# Patient Record
Sex: Male | Born: 1935
Health system: Southern US, Community
[De-identification: ages and names within clinical notes are randomized; demographics above are authoritative.]

## PROBLEM LIST (undated history)

## (undated) DIAGNOSIS — I2510 Atherosclerotic heart disease of native coronary artery without angina pectoris: Secondary | ICD-10-CM

## (undated) DIAGNOSIS — J449 Chronic obstructive pulmonary disease, unspecified: Secondary | ICD-10-CM

## (undated) DIAGNOSIS — K4090 Unilateral inguinal hernia, without obstruction or gangrene, not specified as recurrent: Secondary | ICD-10-CM

## (undated) DIAGNOSIS — J189 Pneumonia, unspecified organism: Secondary | ICD-10-CM

## (undated) DIAGNOSIS — K219 Gastro-esophageal reflux disease without esophagitis: Secondary | ICD-10-CM

## (undated) DIAGNOSIS — K635 Polyp of colon: Secondary | ICD-10-CM

## (undated) DIAGNOSIS — F1910 Other psychoactive substance abuse, uncomplicated: Secondary | ICD-10-CM

## (undated) DIAGNOSIS — I6529 Occlusion and stenosis of unspecified carotid artery: Secondary | ICD-10-CM

## (undated) DIAGNOSIS — I10 Essential (primary) hypertension: Secondary | ICD-10-CM

## (undated) DIAGNOSIS — N189 Chronic kidney disease, unspecified: Secondary | ICD-10-CM

## (undated) DIAGNOSIS — IMO0001 Reserved for inherently not codable concepts without codable children: Secondary | ICD-10-CM

## (undated) DIAGNOSIS — E669 Obesity, unspecified: Secondary | ICD-10-CM

## (undated) DIAGNOSIS — E785 Hyperlipidemia, unspecified: Secondary | ICD-10-CM

## (undated) DIAGNOSIS — D369 Benign neoplasm, unspecified site: Secondary | ICD-10-CM

## (undated) DIAGNOSIS — I219 Acute myocardial infarction, unspecified: Secondary | ICD-10-CM

## (undated) DIAGNOSIS — N529 Male erectile dysfunction, unspecified: Secondary | ICD-10-CM

## (undated) DIAGNOSIS — Z87442 Personal history of urinary calculi: Secondary | ICD-10-CM

## (undated) DIAGNOSIS — R002 Palpitations: Secondary | ICD-10-CM

## (undated) DIAGNOSIS — D469 Myelodysplastic syndrome, unspecified: Secondary | ICD-10-CM

## (undated) DIAGNOSIS — H269 Unspecified cataract: Secondary | ICD-10-CM

## (undated) DIAGNOSIS — R55 Syncope and collapse: Secondary | ICD-10-CM

## (undated) HISTORY — DX: Reserved for inherently not codable concepts without codable children: IMO0001

## (undated) HISTORY — DX: Unspecified cataract: H26.9

## (undated) HISTORY — DX: Gastro-esophageal reflux disease without esophagitis: K21.9

## (undated) HISTORY — DX: Occlusion and stenosis of unspecified carotid artery: I65.29

## (undated) HISTORY — DX: Syncope and collapse: R55

## (undated) HISTORY — DX: Essential (primary) hypertension: I10

## (undated) HISTORY — DX: Palpitations: R00.2

## (undated) HISTORY — DX: Other psychoactive substance abuse, uncomplicated: F19.10

## (undated) HISTORY — DX: Polyp of colon: K63.5

## (undated) HISTORY — DX: Atherosclerotic heart disease of native coronary artery without angina pectoris: I25.10

## (undated) HISTORY — DX: Acute myocardial infarction, unspecified: I21.9

## (undated) HISTORY — DX: Benign neoplasm, unspecified site: D36.9

## (undated) HISTORY — PX: CATARACT EXTRACTION W/ INTRAOCULAR LENS  IMPLANT, BILATERAL: SHX1307

## (undated) HISTORY — DX: Male erectile dysfunction, unspecified: N52.9

## (undated) HISTORY — PX: APPENDECTOMY: SHX54

## (undated) HISTORY — PX: TONSILLECTOMY: SUR1361

## (undated) HISTORY — DX: Obesity, unspecified: E66.9

## (undated) HISTORY — DX: Hyperlipidemia, unspecified: E78.5

## (undated) NOTE — *Deleted (*Deleted)
Responded to Code Stroke called at 2146 for L sided weakness and expressive aphasia, LSN-2100. Pt arrived at 2200, CBG-172, NIH-2 for L facial droop and aphasia, CT head-negative. TPA not given-"too good to treat." CTA-no LVO, CTP-. Pt taken to MRI at 2232. Once in MRI, pt developed respiratory distress with pursed lip breathing and SpO2-79% on RA. Pt placed on 6L [place] with SpO2 increasing to 89%. Pt then placed on NRB with SpO2 increasing to 99%, however, distress continued and pt unable to lay flat for MRI. Pt transported back to ED room. Plan to admit to medical team.

---

## 1998-02-06 HISTORY — PX: DOPPLER ECHOCARDIOGRAPHY: SHX263

## 1998-12-06 HISTORY — PX: CORONARY STENT PLACEMENT: SHX1402

## 1999-01-20 ENCOUNTER — Ambulatory Visit (HOSPITAL_COMMUNITY): Admission: RE | Admit: 1999-01-20 | Discharge: 1999-01-21 | Payer: Self-pay | Admitting: Cardiology

## 1999-01-20 HISTORY — PX: CARDIAC CATHETERIZATION: SHX172

## 2006-12-06 DIAGNOSIS — IMO0001 Reserved for inherently not codable concepts without codable children: Secondary | ICD-10-CM

## 2006-12-06 HISTORY — DX: Reserved for inherently not codable concepts without codable children: IMO0001

## 2007-04-19 HISTORY — PX: CARDIOVASCULAR STRESS TEST: SHX262

## 2008-03-14 ENCOUNTER — Ambulatory Visit: Payer: Self-pay | Admitting: Pulmonary Disease

## 2008-03-14 DIAGNOSIS — E7800 Pure hypercholesterolemia, unspecified: Secondary | ICD-10-CM | POA: Insufficient documentation

## 2008-03-14 DIAGNOSIS — J309 Allergic rhinitis, unspecified: Secondary | ICD-10-CM | POA: Insufficient documentation

## 2008-03-14 DIAGNOSIS — Z8679 Personal history of other diseases of the circulatory system: Secondary | ICD-10-CM | POA: Insufficient documentation

## 2008-03-14 DIAGNOSIS — I10 Essential (primary) hypertension: Secondary | ICD-10-CM

## 2008-03-14 DIAGNOSIS — I219 Acute myocardial infarction, unspecified: Secondary | ICD-10-CM | POA: Insufficient documentation

## 2008-03-14 DIAGNOSIS — J449 Chronic obstructive pulmonary disease, unspecified: Secondary | ICD-10-CM | POA: Insufficient documentation

## 2008-04-08 ENCOUNTER — Telehealth (INDEPENDENT_AMBULATORY_CARE_PROVIDER_SITE_OTHER): Payer: Self-pay | Admitting: *Deleted

## 2008-04-25 ENCOUNTER — Ambulatory Visit: Payer: Self-pay | Admitting: Pulmonary Disease

## 2009-04-02 ENCOUNTER — Ambulatory Visit: Payer: Self-pay | Admitting: Vascular Surgery

## 2009-07-14 ENCOUNTER — Encounter: Admission: RE | Admit: 2009-07-14 | Discharge: 2009-07-14 | Payer: Self-pay | Admitting: Surgery

## 2010-11-04 ENCOUNTER — Ambulatory Visit: Payer: Self-pay | Admitting: Cardiology

## 2011-02-10 ENCOUNTER — Other Ambulatory Visit (INDEPENDENT_AMBULATORY_CARE_PROVIDER_SITE_OTHER): Payer: Medicare Other

## 2011-02-10 DIAGNOSIS — E7800 Pure hypercholesterolemia, unspecified: Secondary | ICD-10-CM

## 2011-02-23 ENCOUNTER — Other Ambulatory Visit: Payer: Self-pay | Admitting: *Deleted

## 2011-02-23 DIAGNOSIS — I10 Essential (primary) hypertension: Secondary | ICD-10-CM

## 2011-02-23 MED ORDER — AMLODIPINE BESYLATE 5 MG PO TABS: 5.0000 mg | ORAL_TABLET | Freq: Every day | ORAL | Status: DC

## 2011-02-23 NOTE — Telephone Encounter (Signed)
Refilled medication per fax request. 

## 2011-04-20 NOTE — Procedures (Signed)
CAROTID DUPLEX EXAM   INDICATION:  Right carotid bruit.   HISTORY:  Diabetes:  No.  Cardiac:  Stent.  Hypertension:  Yes.  Smoking:  Quit about 40 years ago.  Previous Surgery:  CV History:  Amaurosis Fugax No, Paresthesias No, Hemiparesis No.                                       RIGHT             LEFT  Brachial systolic pressure:         140               150  Brachial Doppler waveforms:         Biphasic          Biphasic  Vertebral direction of flow:        Antegrade         Antegrade  DUPLEX VELOCITIES (cm/sec)  CCA peak systolic                   89                107  ECA peak systolic                   105               137  ICA peak systolic                   105               149  ICA end diastolic                   23                28  PLAQUE MORPHOLOGY:                  Calcified         Calcified  PLAQUE AMOUNT:                      Mild              Moderate  PLAQUE LOCATION:                    ICA, ECA          ICA, ECA   IMPRESSION:  1. 40-59% stenosis noted in the left internal carotid artery.  2. 20-39% stenosis noted in the right internal carotid artery.  3. Antegrade bilateral vertebral arteries.   ___________________________________________  Larina Earthly, M.D.   MG/MEDQ  D:  04/02/2009  T:  04/02/2009  Job:  [id number]

## 2011-04-22 ENCOUNTER — Other Ambulatory Visit: Payer: Self-pay | Admitting: *Deleted

## 2011-04-22 MED ORDER — ATENOLOL 25 MG PO TABS: 25.0000 mg | ORAL_TABLET | Freq: Every day | ORAL | Status: DC

## 2011-04-22 NOTE — Telephone Encounter (Signed)
escribe medication per fax request  

## 2011-06-30 ENCOUNTER — Other Ambulatory Visit: Payer: Self-pay | Admitting: *Deleted

## 2011-06-30 MED ORDER — LOSARTAN POTASSIUM-HCTZ 100-25 MG PO TABS: 1.0000 | ORAL_TABLET | Freq: Every day | ORAL | Status: DC

## 2011-06-30 NOTE — Telephone Encounter (Signed)
escribe medication per fax request  

## 2011-07-09 ENCOUNTER — Encounter: Payer: Self-pay | Admitting: Cardiology

## 2011-07-14 ENCOUNTER — Ambulatory Visit: Payer: Medicare Other | Admitting: Cardiology

## 2011-08-05 ENCOUNTER — Other Ambulatory Visit: Payer: Self-pay | Admitting: Cardiology

## 2011-08-05 NOTE — Telephone Encounter (Signed)
escribe medication per fax request  

## 2011-08-06 ENCOUNTER — Other Ambulatory Visit: Payer: Self-pay | Admitting: Cardiology

## 2011-08-06 NOTE — Telephone Encounter (Signed)
escribe medication per fax request  

## 2011-08-11 ENCOUNTER — Inpatient Hospital Stay (HOSPITAL_COMMUNITY)
Admission: EM | Admit: 2011-08-11 | Discharge: 2011-08-13 | DRG: 641 | Disposition: A | Discharge status: Home / Self Care | Payer: Medicare Other | Attending: Family Medicine | Admitting: Family Medicine

## 2011-08-11 ENCOUNTER — Encounter: Payer: Self-pay | Admitting: Family Medicine

## 2011-08-11 ENCOUNTER — Emergency Department (HOSPITAL_COMMUNITY): Payer: Medicare Other

## 2011-08-11 DIAGNOSIS — N179 Acute kidney failure, unspecified: Secondary | ICD-10-CM | POA: Diagnosis present

## 2011-08-11 DIAGNOSIS — E785 Hyperlipidemia, unspecified: Secondary | ICD-10-CM | POA: Diagnosis present

## 2011-08-11 DIAGNOSIS — R339 Retention of urine, unspecified: Secondary | ICD-10-CM | POA: Diagnosis not present

## 2011-08-11 DIAGNOSIS — Z7982 Long term (current) use of aspirin: Secondary | ICD-10-CM

## 2011-08-11 DIAGNOSIS — E669 Obesity, unspecified: Secondary | ICD-10-CM | POA: Diagnosis present

## 2011-08-11 DIAGNOSIS — N400 Enlarged prostate without lower urinary tract symptoms: Secondary | ICD-10-CM | POA: Diagnosis present

## 2011-08-11 DIAGNOSIS — Z79899 Other long term (current) drug therapy: Secondary | ICD-10-CM

## 2011-08-11 DIAGNOSIS — Z87891 Personal history of nicotine dependence: Secondary | ICD-10-CM

## 2011-08-11 DIAGNOSIS — E871 Hypo-osmolality and hyponatremia: Principal | ICD-10-CM | POA: Diagnosis present

## 2011-08-11 DIAGNOSIS — R509 Fever, unspecified: Secondary | ICD-10-CM | POA: Diagnosis present

## 2011-08-11 DIAGNOSIS — I10 Essential (primary) hypertension: Secondary | ICD-10-CM | POA: Diagnosis present

## 2011-08-11 DIAGNOSIS — I2510 Atherosclerotic heart disease of native coronary artery without angina pectoris: Secondary | ICD-10-CM | POA: Diagnosis present

## 2011-08-11 DIAGNOSIS — T502X5A Adverse effect of carbonic-anhydrase inhibitors, benzothiadiazides and other diuretics, initial encounter: Secondary | ICD-10-CM | POA: Diagnosis present

## 2011-08-11 DIAGNOSIS — Z23 Encounter for immunization: Secondary | ICD-10-CM

## 2011-08-11 LAB — COMPREHENSIVE METABOLIC PANEL
AST: 28 U/L (ref 0–37)
Albumin: 4.1 g/dL (ref 3.5–5.2)
Alkaline Phosphatase: 56 U/L (ref 39–117)
CO2: 21 mEq/L (ref 19–32)
Chloride: 87 mEq/L — ABNORMAL LOW (ref 96–112)
GFR calc non Af Amer: 40 mL/min — ABNORMAL LOW (ref >60)
Potassium: 4.5 mEq/L (ref 3.5–5.1)
Total Bilirubin: 0.4 mg/dL (ref 0.3–1.2)

## 2011-08-11 LAB — BASIC METABOLIC PANEL
BUN: 24 mg/dL — ABNORMAL HIGH (ref 6–23)
CO2: 23 mEq/L (ref 19–32)
Chloride: 88 mEq/L — ABNORMAL LOW (ref 96–112)
GFR calc non Af Amer: 41 mL/min — ABNORMAL LOW (ref >60)
Glucose, Bld: 114 mg/dL — ABNORMAL HIGH (ref 70–99)
Potassium: 4.4 mEq/L (ref 3.5–5.1)
Sodium: 121 mEq/L — ABNORMAL LOW (ref 135–145)

## 2011-08-11 LAB — DIFFERENTIAL
Basophils Absolute: 0.1 10*3/uL (ref 0.0–0.1)
Eosinophils Relative: 1 % (ref 0–5)
Lymphocytes Relative: 14 % (ref 12–46)
Lymphs Abs: 0.9 10*3/uL (ref 0.7–4.0)
Monocytes Absolute: 0.8 10*3/uL (ref 0.1–1.0)
Neutro Abs: 4.2 10*3/uL (ref 1.7–7.7)

## 2011-08-11 LAB — URINALYSIS, ROUTINE W REFLEX MICROSCOPIC
Bilirubin Urine: NEGATIVE
Glucose, UA: NEGATIVE mg/dL
Hgb urine dipstick: NEGATIVE
Specific Gravity, Urine: 1.025 (ref 1.005–1.030)
pH: 6 (ref 5.0–8.0)

## 2011-08-11 LAB — CBC
HCT: 37.5 % — ABNORMAL LOW (ref 39.0–52.0)
Hemoglobin: 14.1 g/dL (ref 13.0–17.0)
MCHC: 37.6 g/dL — ABNORMAL HIGH (ref 30.0–36.0)
MCV: 88.2 fL (ref 78.0–100.0)
RDW: 13.1 % (ref 11.5–15.5)
WBC: 5.9 10*3/uL (ref 4.0–10.5)

## 2011-08-11 LAB — PROTIME-INR: INR: 1.11 (ref 0.00–1.49)

## 2011-08-11 LAB — APTT: aPTT: 31 seconds (ref 24–37)

## 2011-08-11 NOTE — H&P (Signed)
Patrick Rodriguez is an 77 y.o. male.   Chief Complaint: weakness, fever HPI: Pt is an 78 yo male presenting with a one week history of weakness with new onset fevers today. Pt states that 3 weeks ago he stepped on a toothpick. At that time he was seen by his doctor who started Bactrim. His foot pain did not improve so he was seen by his podiatrist who did an foot MRI (which showed no foreign body) and started him on Cipro as well. A few days ago, pt states the toothpick came out the dorsal side of his foot. He has not had any pain since that time, but he states he has not felt well since he started taking antibiotics one week ago. He has had dizziness, decreased PO intake and weakness. Starting this morning, pt had chills and had a measured temp of 101. A doctor acquaintance told him that given the foreign body and new temp, he should be evaluated for sepsis.  In the ED today, he was afebrile and physical exam was wnl. On his labs, pt was found to have a sodium of 121, Cr of 1.6 and BUN of 24 which is above his baseline per [facility]. (They have a BUN/Cr from Cardiology in his chart from March of 15/1.09)  Of note, patient does have complaint of lower abdominal pain. He is currently on Cardura for BPH, and he does endorse BPH like symptoms. Denies N/V, but states he has been having loose stools a few times/day. No HA, specific joint pain, or other complaints.   Past Medical History  Diagnosis Date  . Hyperlipidemia   . Hypertension   . Coronary artery disease   . Obesity   . ED (erectile dysfunction)     Past Surgical History  Procedure Date  . Cardiac catheterization 01/20/1999    EF 60%  . Tonsillectomy   . Appendectomy   . Doppler echocardiography 02/06/1998    EF 60%  . Cardiovascular stress test 04/19/2007    EF 74%  . Coronary stent placement 2000    LAD    Family History  Problem Relation Age of Onset  . Aneurysm/SAH Mother 26  . Cancer (stomach) Father    Social  History: Pt reports that he quit smoking about 36 years ago. He was a heavy drinker until 33. 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   Allergies: No Known Allergies  No current facility-administered medications on file as of 08/11/2011.   Medications Prior to Admission  Medication Sig Dispense Refill  . amLODipine (NORVASC) 5 MG tablet Take 1 tablet (5 mg total) by mouth daily.  30 tablet  11  . atenolol (TENORMIN) 25 MG tablet Take 1 tablet (25 mg total) by mouth daily.  30 tablet  5  . Coenzyme Q10 (COQ10 PO) Take by mouth daily.        Marland Kitchen doxazosin (CARDURA) 1 MG tablet TAKE 1 TABLET BY MOUTH AT BEDTIME  30 tablet  5  . esomeprazole (NEXIUM) 40 MG capsule Take 40 mg by mouth daily before breakfast.        . losartan-hydrochlorothiazide (HYZAAR) 100-25 MG per tablet Take 1 tablet by mouth daily.  30 tablet  5  . Multiple Vitamin (MULTIVITAMIN) tablet Take 1 tablet by mouth daily.        Marland Kitchen omeprazole (PRILOSEC) 20 MG capsule TAKE ONE CAPSULE BY MOUTH TWICE A DAY  30 capsule  5  . Zinc-Magnesium Aspart-Vit B6 (ZINC MAGNESIUM  ASPARTATE PO) Take by mouth daily.          Results for orders placed during the hospital encounter of 08/11/11 (from the past 48 hour(s))  URINALYSIS, ROUTINE W REFLEX MICROSCOPIC     Status: Normal   Collection Time   08/11/11  2:47 PM      Component Value Range Comment   Color, Urine YELLOW  YELLOW     Appearance CLEAR  CLEAR     Specific Gravity, Urine 1.025  1.005 - 1.030     pH 6.0  5.0 - 8.0     Glucose, UA NEGATIVE  NEGATIVE (mg/dL)    Hgb urine dipstick NEGATIVE  NEGATIVE     Bilirubin Urine NEGATIVE  NEGATIVE     Ketones, ur NEGATIVE  NEGATIVE (mg/dL)    Protein, ur NEGATIVE  NEGATIVE (mg/dL)    Urobilinogen, UA 0.2  0.0 - 1.0 (mg/dL)    Nitrite NEGATIVE  NEGATIVE     Leukocytes, UA NEGATIVE  NEGATIVE  MICROSCOPIC NOT DONE ON URINES WITH NEGATIVE PROTEIN, BLOOD, LEUKOCYTES, NITRITE, OR GLUCOSE <1000 mg/dL.  CREATININE, URINE, RANDOM     Status: Normal    Collection Time   08/11/11  2:47 PM      Component Value Range Comment   Creatinine, Urine 171.22     SODIUM, URINE, RANDOM     Status: Normal   Collection Time   08/11/11  2:47 PM      Component Value Range Comment   Sodium, Ur 95     BASIC METABOLIC PANEL     Status: Abnormal   Collection Time   08/11/11  3:12 PM      Component Value Range Comment   Sodium 121 (*) 135 - 145 (mEq/L)    Potassium 4.4  3.5 - 5.1 (mEq/L)    Chloride 88 (*) 96 - 112 (mEq/L)    CO2 23  19 - 32 (mEq/L)    Glucose, Bld 114 (*) 70 - 99 (mg/dL)    BUN 24 (*) 6 - 23 (mg/dL)    Creatinine, Ser 1.61 (*) 0.50 - 1.35 (mg/dL)    Calcium 9.0  8.4 - 10.5 (mg/dL)    GFR calc non Af Amer 41 (*) >60 (mL/min)    GFR calc Af Amer 49 (*) >60 (mL/min)   DIFFERENTIAL     Status: Abnormal   Collection Time   08/11/11  3:12 PM      Component Value Range Comment   Neutrophils Relative 71  43 - 77 (%)    Neutro Abs 4.2  1.7 - 7.7 (K/uL)    Lymphocytes Relative 14  12 - 46 (%)    Lymphs Abs 0.9  0.7 - 4.0 (K/uL)    Monocytes Relative 13 (*) 3 - 12 (%)    Monocytes Absolute 0.8  0.1 - 1.0 (K/uL)    Eosinophils Relative 1  0 - 5 (%)    Eosinophils Absolute 0.0  0.0 - 0.7 (K/uL)    Basophils Relative 1  0 - 1 (%)    Basophils Absolute 0.1  0.0 - 0.1 (K/uL)   CBC     Status: Abnormal   Collection Time   08/11/11  3:12 PM      Component Value Range Comment   WBC 5.9  4.0 - 10.5 (K/uL)    RBC 4.25  4.22 - 5.81 (MIL/uL)    Hemoglobin 14.1  13.0 - 17.0 (g/dL)    HCT 09.6 (*) 04.5 - 52.0 (%)  MCV 88.2  78.0 - 100.0 (fL)    MCH 33.2  26.0 - 34.0 (pg)    MCHC 37.6 (*) 30.0 - 36.0 (g/dL) RULED OUT INTERFERING SUBSTANCES   RDW 13.1  11.5 - 15.5 (%)    Platelets 175  150 - 400 (K/uL)   PROTIME-INR     Status: Normal   Collection Time   08/11/11  6:59 PM      Component Value Range Comment   Prothrombin Time 14.5  11.6 - 15.2 (seconds)    INR 1.11  0.00 - 1.49    APTT     Status: Normal   Collection Time   08/11/11  6:59 PM       Component Value Range Comment   aPTT 31  24 - 37 (seconds)   COMPREHENSIVE METABOLIC PANEL     Status: Abnormal   Collection Time   08/11/11  6:59 PM      Component Value Range Comment   Sodium 120 (*) 135 - 145 (mEq/L)    Potassium 4.5  3.5 - 5.1 (mEq/L)    Chloride 87 (*) 96 - 112 (mEq/L)    CO2 21  19 - 32 (mEq/L)    Glucose, Bld 88  70 - 99 (mg/dL)    BUN 24 (*) 6 - 23 (mg/dL)    Creatinine, Ser 3.08 (*) 0.50 - 1.35 (mg/dL)    Calcium 8.7  8.4 - 10.5 (mg/dL)    Total Protein 7.0  6.0 - 8.3 (g/dL)    Albumin 4.1  3.5 - 5.2 (g/dL)    AST 28  0 - 37 (U/L)    ALT 24  0 - 53 (U/L)    Alkaline Phosphatase 56  39 - 117 (U/L)    Total Bilirubin 0.4  0.3 - 1.2 (mg/dL)    GFR calc non Af Amer 40 (*) >60 (mL/min)    GFR calc Af Amer 49 (*) >60 (mL/min)    Dg Chest 2 View  08/11/2011  *RADIOLOGY REPORT*  Clinical Data: Shortness of breath, pain  CHEST - 2 VIEW  Comparison: None.  Findings: The lungs are essentially clear. No pleural effusion or pneumothorax.  Cardiomediastinal silhouette is within normal limits.  Degenerative changes of the visualized thoracolumbar spine.  IMPRESSION: No evidence of acute cardiopulmonary disease.  Original Report Authenticated By: Charline Bills, M.D.    Review of Systems  Constitutional: Positive for fever, chills and malaise/fatigue.  Respiratory: Negative for cough.   Cardiovascular: Negative for chest pain.  Gastrointestinal: Positive for abdominal pain and diarrhea (Loose stools 1-2x/day).  Genitourinary: Positive for frequency.  Musculoskeletal: Negative for myalgias and joint pain.  Neurological: Positive for dizziness and weakness. Negative for headaches.    There were no vitals taken for this visit. Physical Exam  Nursing note and vitals reviewed. Constitutional: He is oriented to person, place, and time. He appears well-developed and well-nourished. No distress.  HENT:  Head: Normocephalic and atraumatic.  Right Ear: External ear normal.    Left Ear: External ear normal.  Eyes: Pupils are equal, round, and reactive to light.  Neck: Normal range of motion. Neck supple.  Cardiovascular: Normal rate, regular rhythm and normal heart sounds.  Exam reveals no gallop and no friction rub.   No murmur heard. Respiratory: Effort normal and breath sounds normal. No respiratory distress. He has no wheezes. He has no rales.  GI: Bowel sounds are normal. He exhibits distension. There is tenderness in the suprapubic area.  Musculoskeletal: Normal range of  motion. He exhibits no edema.       Right foot with healing scar on dorsal side below 3-4 digit. No S/sx of infection.  Neurological: He is oriented to person, place, and time. No cranial nerve deficit. He exhibits normal muscle tone. Coordination normal.  Skin: Skin is warm and dry. He is not diaphoretic.  Psychiatric: He has a normal mood and affect. His behavior is normal.     Assessment/Plan Pt is an 77 yo M with a PMH of HTN, CAD, BPH and recent foot trauma who is presenting with 1 week history of weakness, decreased PO intake and fever.  1. Hyponatremia: Could be secondary to decreased PO intake. Will start normal saline fluids at maintenance (125 cc/hr) and repeat Na in the morning. Given distant drinking history and vague abdominal pain, we will check for liver cirrhotic-like changes with CMet, PT/INR/PTT. 2. Fever: Afebrile since admission. Will continue to monitor. Blood cultures x2 have been sent, but foot wound did not look infected. Pt has been on Bactrim and Cipro lately which should cover his injury, but may be causing a mild drug fever? Also, given loose stools and fever in light of recent antibiotic use, we will send a C.diff toxin.  3. Acute Renal Failure: BUN/Cr have increased above baseline. Pt is also endorsing lower abdominal pain and BPH symptoms. If pt is obstructed, could be causing urinary retention. Will get urine studies (Urine Na, Cr and osmolality) to calculate FENA.  Also, we will do a post-void residual bladder scan to evaluate retention. If pt has >250 cc of urine post-void, nursing is to call HO and do I/O cath. Pt was on Cardura as an outpatient which we can consider restarting.  4. HTN: Treated with Norvasc, Hyzaar and Atenolol. Will hold ARB and BB for now; continue Norvasc 10 mg. We will monitor BP. 5. FEN/GI: Normal diet. NS @125cc /hr 6. PPx: Heparin 5000U SQ q 8 hours; Protonix 40mg  daily 7. Dispo: Pending clinical improvement.   PCP: [facility] Dictation #: [id number]  Melainie Krinsky 08/11/2011, 8:55 PM

## 2011-08-12 ENCOUNTER — Inpatient Hospital Stay (HOSPITAL_COMMUNITY): Payer: Medicare Other

## 2011-08-12 DIAGNOSIS — I10 Essential (primary) hypertension: Secondary | ICD-10-CM

## 2011-08-12 DIAGNOSIS — E871 Hypo-osmolality and hyponatremia: Secondary | ICD-10-CM

## 2011-08-12 DIAGNOSIS — N179 Acute kidney failure, unspecified: Secondary | ICD-10-CM

## 2011-08-12 LAB — BASIC METABOLIC PANEL
BUN: 19 mg/dL (ref 6–23)
BUN: 19 mg/dL (ref 6–23)
CO2: 23 mEq/L (ref 19–32)
Calcium: 8.1 mg/dL — ABNORMAL LOW (ref 8.4–10.5)
Chloride: 96 mEq/L (ref 96–112)
Creatinine, Ser: 1.39 mg/dL — ABNORMAL HIGH (ref 0.50–1.35)
GFR calc non Af Amer: 49 mL/min — ABNORMAL LOW (ref >60)
Glucose, Bld: 91 mg/dL (ref 70–99)
Glucose, Bld: 95 mg/dL (ref 70–99)
Potassium: 3.9 mEq/L (ref 3.5–5.1)

## 2011-08-12 LAB — CBC
HCT: 32.1 % — ABNORMAL LOW (ref 39.0–52.0)
Hemoglobin: 11.7 g/dL — ABNORMAL LOW (ref 13.0–17.0)
MCH: 32.1 pg (ref 26.0–34.0)
MCHC: 36.4 g/dL — ABNORMAL HIGH (ref 30.0–36.0)
MCV: 88.2 fL (ref 78.0–100.0)
RBC: 3.64 MIL/uL — ABNORMAL LOW (ref 4.22–5.81)

## 2011-08-12 LAB — URINE CULTURE
Colony Count: NO GROWTH
Culture  Setup Time: 201209052011

## 2011-08-12 LAB — PHOSPHORUS: Phosphorus: 2.7 mg/dL (ref 2.3–4.6)

## 2011-08-13 LAB — BASIC METABOLIC PANEL
Chloride: 101 mEq/L (ref 96–112)
GFR calc Af Amer: >60 mL/min (ref >60)
Potassium: 4.3 mEq/L (ref 3.5–5.1)

## 2011-08-13 LAB — CBC
Platelets: 127 10*3/uL — ABNORMAL LOW (ref 150–400)
RDW: 13.1 % (ref 11.5–15.5)
WBC: 4.6 10*3/uL (ref 4.0–10.5)

## 2011-08-17 ENCOUNTER — Telehealth: Payer: Self-pay | Admitting: Cardiology

## 2011-08-17 LAB — CULTURE, BLOOD (ROUTINE X 2)
Culture  Setup Time: 201209052232
Culture: NO GROWTH

## 2011-08-17 NOTE — Telephone Encounter (Signed)
Patient called to discuss medication changed from hospital.

## 2011-08-17 NOTE — Telephone Encounter (Signed)
Called stating he was in hospital recently. He stepped on a toothpick at his house about 2 wks ago. Started getting chills, and fever. Went to ER and was admitted on 9/5. Was seen by Dr. Denny Levy. On discharge 9/7 was told to stop Cardura and Hyzaar and put him on Uroxatral 10 mg. Wanted to know if that was OK w/Dr. [place]. His BP yest was 170/ but was late taking his meds.. Today was 136/61. Per Dr. [place] advised to monitor BP until we see 10/8. He will let us know if BP becomes elevated.

## 2011-08-18 NOTE — H&P (Signed)
NAME:  Patrick Rodriguez, Patrick Rodriguez NO.:  [id number]  MEDICAL RECORD NO.:  [id number]  LOCATION:  [facility]                         FACILITY:  [facility]  PHYSICIAN:  Paula Compton, MD        DATE OF BIRTH:  29-Sep-1925  DATE OF ADMISSION:  08/11/2011 DATE OF DISCHARGE:                             HISTORY & PHYSICAL   PRIMARY CARE PROVIDER:  [facility].  CHIEF COMPLAINT:  Weakness and fever.  HISTORY OF PRESENT ILLNESS:  The patient is an 77 year old male presenting with a 1-week history of weakness with new-onset fevers today.  The patient states that 3 weeks ago, he stepped on a toothpick. At that time, he was seen by his doctor who started him on Bactrim.  His foot pain did not improve, so he was seen by his podiatrist who did a foot MRI which showed no foreign body and started him on Cipro.  Few days ago, the patient states that the toothpick came out of the dorsal side of his foot.  He has not had any pain since that time, but he states he has not felt well since he started antibiotics 1 week ago.  He has had dizziness, decreased p.o. intake, and weakness.  Starting this morning, the patient had chills and a measurable fever of 100.1.  Dr. Joni Fears has told him to be evaluated for infection given the fact that he had a foreign body in his foot and he has any new temperature.  In the emergency department today, he was afebrile and physical exam was within normal limits.  On his labs, the patient was found to have a sodium of 121, creatinine of 1.6, BUN of 24 which is above his baseline purpose per [facility] (they have a BUN/creatinine from Cardiology in his chart from March 2012, which was 15/1.09.  of note, the patient does have a complaint of lower abdominal pain.  He is currently on Cardura for BPH and he does endorse BPH-like symptoms now. He denies nausea and vomiting, but he states he has been having loose stools for a few days multiple times per day.   The patient does not report headache.  No specific joint pain and no other complaints.  PAST MEDICAL HISTORY: 1. Hyperlipidemia. 2. Hypertension. 3. Coronary artery disease. 4. Obesity. 5. BPH. 6. Erectile dysfunction.  PAST SURGICAL HISTORY: 1. Cardiac catheterization in February 2000. 2. Tonsillectomy. 3. Appendectomy.  FAMILY HISTORY:  Mother had a subarachnoid hemorrhage at age 33, father died of stomach cancer.  SOCIAL HISTORY:  The patient reports he quit smoking about 36 years ago. He was a heavy drinker until 1970 and has not had a drink since then. 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  ALLERGIES:  No known drug allergies.  MEDICATIONS PRIOR TO ADMISSION: 1. Norvasc 5 mg daily. 2. Atenolol 25 mg daily. 3. Coenzyme-Q daily 4. Cardura 1 mg nightly. 5. Nexium 40 mg daily. 6. Hyzaar 100/25 mg tablet daily. 7. Multivitamin. 8. Prilosec 20 mg capsule b.i.d. 9. Zinc, magnesium, vitamin B supplement daily.  PHYSICAL EXAMINATION:  VITAL SIGNS:  Temperature 98.2, heart rate 93, respiration rate 11, blood  pressure 151/68, O2 sat 98% on room air. CONSTITUTIONAL:  He is lying in bed, in no acute distress.  Oriented to person, place, and time, appears well-developed and well-nourished. HEENT:  Normocephalic, atraumatic.  Ears within normal limits.  Eyes, pupils are equal, round and reactive to light. NECK:  Normal range of motion and supple. CARDIOVASCULAR:  Normal rate, regular rhythm, and normal heart sounds. No murmur, gallop or rub. PULMONARY:  Normal effort and breath sounds are within normal limits, no respiratory distress.  No wheezes or rales noted. GASTROINTESTINAL:  Bowel sounds are within normal limits.  He does have a distention and tenderness in the suprapubic area. MUSCULOSKELETAL:  Normal range of motion of motion.  He exhibits no edema.  Right foot does have a healing scar on the dorsal side below the third to fourth digit with no signs of symptoms of  infection. NEUROLOGIC:  He is oriented.  No cranial nerve deficit.  He has normal muscle tone and normal coordination. SKIN:  Warm and dry.  ASSESSMENT/PLAN:  The patient is an 77 year old male with past medical history of hypertension, coronary artery disease, and benign prostatic hypertrophy with recent foot trauma, who is presenting with a 1-week history of weakness, decreased p.o., and fever. 1. Hyponatremia, unknown etiology, could be secondary to decreased     p.o. intake, was started on normal saline IV fluids at 125 mL/hour     and repeat sodium in the morning.  Given the patient's distant     drinking history and vague abdominal pain, we will check for liver     cirrhotic-like changes with a CMET, PT, INR, and PTT in the     morning. 2. Fever, afebrile since admission.  We will continue to monitor fever     curve.  Blood cultures x2 have been sent.  Foot wound did not look     infected.  The patient has been on Bactrim and Cipro lately which     should cover his injury, but may be causing mild drug fever?Marland Kitchen  Also     given loose stools and fever in the light of recent antibiotic use,     we will send a Clostridium difficile toxin. 3. renal failure.  BUN and creatinine have been increased above     baseline.  The patient is also endorsing lower abdominal pain and     benign prostatic hypertrophy-like symptoms.  If the patient is     obstructed, this could be causing urinary retention.  We will get     urine studies which include a urine sodium, urine creatinine, and     urine osmolality to calculate FENa.  Also we will do a postvoid     residual bladder scan to evaluate for retention.  If the patient     has greater than 250 mL of urine postvoid, nursing is to call the     house officer and do an in-an-out catheterization.  The patient was     on Cardura as an outpatient for his benign prostatic hypertrophy     which we will consider restarting tomorrow. 4. Hypertension,  treated with Norvasc, Hyzaar and atenolol as an     outpatient.  We will hold the ARB and beta-blocker for now;     continue Norvasc 10 mg.  We will continue to monitor blood     pressure. 5. FEN/GI.  The patient on an normal diet.  Normal saline at 125 mL     per  hour. 6. Prophylaxis.  Heparin 5000 units subcu every 8 hours; Protonix 40     mg daily. 7. Disposition is pending clinical improvement.    ______________________________ Rodman Pickle, MD   ______________________________ Paula Compton, MD    AH/MEDQ  D:  08/11/2011  T:  08/12/2011  Job:  [id number]  Electronically Signed by Rodman Pickle  on 08/12/2011 02:42:46 PM Electronically Signed by Paula Compton MD on 08/18/2011 02:39:45 PM

## 2011-08-23 NOTE — Discharge Summary (Signed)
NAME:  Patrick Rodriguez NO.:  [id number]  MEDICAL RECORD NO.:  [id number]  LOCATION:  [facility]                         FACILITY:  [facility]  PHYSICIAN:  Nestor Ramp, MD        DATE OF BIRTH:  October 22, 1925  DATE OF ADMISSION:  08/11/2011 DATE OF DISCHARGE:  08/13/2011                              DISCHARGE SUMMARY   PRIMARY CARE PHYSICIAN:  None ([facility]).  DISCHARGE DIAGNOSES: 1. Hyponatremia. 2. Acute renal failure. 3. Benign prostatic hypertrophy. 4. Hypertension.  MEDICATIONS TO STOP TAKING: 1. Cardura 1 mg. 2. Hyzaar 100 - 25 mg.  NEW MEDICATION:  Uroxatral 10 mg daily.  MEDICATIONS TO CONTINUE TAKING AT HOME: 1. Aspirin 325. 2. Atenolol 25. 3. Beta prostate OTC. 5. Multivitamins OTC. 6. Norvasc 5 mg. 7. Omeprazole 20 mg. 8. Vitamin A OTC. 9. Vitamin B12 OTC. 10.Vitamin C OTC. 11.Vitamin E OTC.  PROCEDURES:  Renal ultrasound showed normal kidneys for age and no hydronephrosis. LABS AT ADMISSION:  The patient's sodium was 120.  His creatinine was 1.62 and BUN of 24, which was above his baseline of creatinine of 1.09 in March by his cardiologist.  At discharge, the patient's sodium was 130 and trending upwards, his creatinine was 1.15 and BUN of 14.  HOSPITAL COURSE:  The patient is an 77 year old male with past medical history of hypertension, benign prostatic hypertrophy, and coronary artery disease with recent trauma who presented to the emergency department with a 1-week history of weakness and dizziness and recent onset fever and decreased p.o. intake.  The patient was found to have a sodium of 120 and was in acute renal failure with a creatinine of 1.60, therefore he was admitted for IV fluids and additional lab tests. 1. Hyponatremia.  This is thought to be likely secondary to     hydrochlorothiazide use, decreased p.o. intake and the patient's     age.  Hyzaar withheld when he was inpatient and the patient has     been  on IV fluids of normal saline at 125 mL an hour and he had     gradual improvement in his sodium.  At discharge, his Hyzaar which     has HCTZ in it was not restarted.  We have encouraged the patient     to speak with his cardiologist about his new medication changes.     The patient will continue to follow a normal diet and follow up     with his PCP and Cardiology for a repeat BMET. 2. Acute renal failure.  Creatinine of 1.67 at admission.  Renal     ultrasound was within normal limits for his age.  The patient does     endorse BPH symptoms and he has had that diagnosis in the past.     Therefore, the bump in his creatinine was thought to be obstructive     in nature.  His FENa was 0.67 and at discharge his creatinine had     improved to 1.1.  The patient did have a Foley placed temporarily while he was     inpatient due to increased post void residuals.  3. BPH.  Post void residuals were greater than 250 on hospital day #1,     therefore a Foley cath was placed for 24 hours.  The patient was on     Cardura 1 mg as an outpatient. His Cardura was stopped and the     patient was started on Uroxatral 10 mg daily, which was more     prostate specific.  The patient will need close followup. 4. Hypertension.  Stable while inpatient, while on Norvasc.  The     patient will continue Norvasc and atenolol at his home doses as an     outpatient.  We did stop his Hyzaar and Cardura due to hypokalemia     and BPH.  The patient will talk to his cardiologist about this. 5. Fever.  The patient had a measured temperature of 101 at home, but     he has been afebrile since admission to our hospital.  Blood     cultures were negative at 24 hours and urine culture was negative,     final results.  C. diff toxin was ordered, given recent antibiotic     use for foot injury and reported "loose stool."  The patient states     he has not had diarrhea, and therefore this order was cancelled.     If the patient  continues to have fevers and loose stools at home,     we would reconsider testing for C. diff.  He is not on any     antibiotics at this time.  FOLLOWUP RECOMMENDATIONS:  The patient to inform his cardiologist in medication changes.  He will need refills on Uroxatral if Cardiology thinks this is appropriate.  The patient will follow up with [facility] next week.  The patient will need repeat BMET to monitor for sodium and creatinine improvements, which can probably be done at his cardiology appointment on October 8.    ______________________________ Rodman Pickle, MD   ______________________________ Nestor Ramp, MD    AH/MEDQ  D:  08/13/2011  T:  08/14/2011  Job:  [id number]  Electronically Signed by Rodman Pickle  on 08/16/2011 04:21:20 PM Electronically Signed by Denny Levy MD on 08/23/2011 10:09:13 AM

## 2011-09-06 ENCOUNTER — Ambulatory Visit (INDEPENDENT_AMBULATORY_CARE_PROVIDER_SITE_OTHER): Payer: Medicare Other | Admitting: Pulmonary Disease

## 2011-09-06 ENCOUNTER — Encounter: Payer: Self-pay | Admitting: Pulmonary Disease

## 2011-09-06 DIAGNOSIS — J45909 Unspecified asthma, uncomplicated: Secondary | ICD-10-CM

## 2011-09-06 DIAGNOSIS — J45901 Unspecified asthma with (acute) exacerbation: Secondary | ICD-10-CM

## 2011-09-06 DIAGNOSIS — J209 Acute bronchitis, unspecified: Secondary | ICD-10-CM

## 2011-09-06 MED ORDER — LEVOFLOXACIN 750 MG PO TABS: 750.0000 mg | ORAL_TABLET | Freq: Every day | ORAL | Status: AC

## 2011-09-06 MED ORDER — PREDNISONE 10 MG PO TABS: ORAL_TABLET | ORAL | Status: DC

## 2011-09-06 MED ORDER — ALBUTEROL SULFATE HFA 108 (90 BASE) MCG/ACT IN AERS: 2.0000 | INHALATION_SPRAY | Freq: Four times a day (QID) | RESPIRATORY_TRACT | Status: DC | PRN

## 2011-09-06 MED ORDER — MOMETASONE FUROATE 220 MCG/INH IN AEPB: 2.0000 | INHALATION_SPRAY | Freq: Every day | RESPIRATORY_TRACT | Status: DC

## 2011-09-06 NOTE — Progress Notes (Signed)
  Subjective:    Patient ID: Patrick Rodriguez, male    DOB: 11-Nov-1925, 77 y.o.   MRN: [id number]  HPI The patient is an 77 year old male who comes in today as a self-referral for reevaluation of dyspnea.  He has been seen in the past, with the last visit being in 2009.  At that time, he was found to have moderate to severe airflow obstruction that was felt to be secondary to asthma and emphysema.  He was started on Asmanex as a trial, and asked to followup within 4-6 months.  The patient has never been seen since.  He states that he did well on Asmanex, but then began to only use it as needed.  He felt that he had no significant breathing issues after that, but approximately 2 weeks ago began to get sick.  He had no acute exacerbations prior to this.  The patient states he began to have sinus congestion and sore throat, and this ultimately went into his chest.  He developed cough with congestion, and began to produce pale yellow mucous.  He has noticed increased shortness of breath and significant wheezing.  He had a recent chest x-ray a few weeks ago that was clear.   Review of Systems  Constitutional: Negative for fever and unexpected weight change.  HENT: Positive for congestion. Negative for ear pain, nosebleeds, sore throat, rhinorrhea, sneezing, trouble swallowing, dental problem, postnasal drip and sinus pressure.   Eyes: Negative for redness and itching.  Respiratory: Positive for cough and shortness of breath. Negative for chest tightness and wheezing.   Cardiovascular: Negative for palpitations and leg swelling.  Gastrointestinal: Negative for nausea and vomiting.  Genitourinary: Negative for dysuria.  Musculoskeletal: Negative for joint swelling.  Skin: Negative for rash.  Neurological: Negative for headaches.  Hematological: Does not bruise/bleed easily.  Psychiatric/Behavioral: Negative for dysphoric mood. The patient is not nervous/anxious.        Objective:   Physical  Exam Constitutional:  Well developed, no acute distress  HENT:  Nares patent without discharge  Oropharynx without exudate, palate and uvula are normal  Eyes:  Perrla, eomi, no scleral icterus  Neck:  No JVD, no TMG  Cardiovascular:  Normal rate, regular rhythm, no rubs or gallops.  No murmurs        Intact distal pulses  Pulmonary :  Decreased bs, no stridor or respiratory distress   +wheezes and rhonchi throughout.   Abdominal:  Soft, nondistended, bowel sounds present.  No tenderness noted.   Musculoskeletal:  No lower extremity edema noted.  Lymph Nodes:  No cervical lymphadenopathy noted  Skin:  No cyanosis noted  Neurologic:  Alert, appropriate, moves all 4 extremities without obvious deficit.         Assessment & Plan:

## 2011-09-06 NOTE — Assessment & Plan Note (Signed)
The patient is having increased chest congestion and cough with purulent mucus, most consistent with acute bronchitis.  He will need a course of antibiotics to get him through this process.

## 2011-09-06 NOTE — Assessment & Plan Note (Signed)
The patient is having increased wheezing and shortness of breath, and has acute bronchospasm on exam today.  He will need a course of prednisone to get him through this.  I have explained to him again the importance of controlling airway inflammation, and this includes taking a maintenance medication even when feeling well.  He states that he will try and be more compliant.

## 2011-09-06 NOTE — Patient Instructions (Signed)
Will treat with antibiotics and a short course of prednisone You need to stay on asmanex 2 inhalations everynight regardless of how you feel.  Rinse mouth well Albuterol inhaler  2 inhalations every 6hrs if needed for rescue followup with me in 6mos.

## 2011-09-09 ENCOUNTER — Other Ambulatory Visit: Payer: Self-pay | Admitting: *Deleted

## 2011-09-09 DIAGNOSIS — I10 Essential (primary) hypertension: Secondary | ICD-10-CM

## 2011-09-09 MED ORDER — AMLODIPINE BESYLATE 5 MG PO TABS: 5.0000 mg | ORAL_TABLET | Freq: Every day | ORAL | Status: DC

## 2011-09-09 NOTE — Telephone Encounter (Signed)
Refilled amlodipine 

## 2011-09-13 ENCOUNTER — Ambulatory Visit: Payer: Medicare Other | Admitting: Cardiology

## 2011-09-14 ENCOUNTER — Other Ambulatory Visit: Payer: Self-pay | Admitting: *Deleted

## 2011-09-15 ENCOUNTER — Telehealth: Payer: Self-pay | Admitting: Cardiology

## 2011-09-15 MED ORDER — DOXAZOSIN MESYLATE 1 MG PO TABS: 1.0000 mg | ORAL_TABLET | Freq: Every day | ORAL | Status: DC

## 2011-09-15 NOTE — Telephone Encounter (Signed)
Pt needs refill on Cardura 1mg  sent into [facility] [phone]. Per pt this has been requested several times

## 2011-09-16 ENCOUNTER — Institutional Professional Consult (permissible substitution): Payer: Medicare Other | Admitting: Pulmonary Disease

## 2011-09-16 ENCOUNTER — Other Ambulatory Visit: Payer: Self-pay

## 2011-09-16 DIAGNOSIS — I10 Essential (primary) hypertension: Secondary | ICD-10-CM

## 2011-10-26 ENCOUNTER — Ambulatory Visit (INDEPENDENT_AMBULATORY_CARE_PROVIDER_SITE_OTHER): Payer: Medicare Other | Admitting: Cardiology

## 2011-10-26 ENCOUNTER — Encounter: Payer: Self-pay | Admitting: Cardiology

## 2011-10-26 VITALS — BP 148/70 | HR 68 | Ht 68.5 in | Wt 177.8 lb

## 2011-10-26 DIAGNOSIS — I10 Essential (primary) hypertension: Secondary | ICD-10-CM

## 2011-10-26 DIAGNOSIS — E785 Hyperlipidemia, unspecified: Secondary | ICD-10-CM

## 2011-10-26 DIAGNOSIS — I2510 Atherosclerotic heart disease of native coronary artery without angina pectoris: Secondary | ICD-10-CM | POA: Insufficient documentation

## 2011-10-26 NOTE — Patient Instructions (Signed)
Keep an eye on your blood pressure.  Try and regain your conditioning with exercise.  If your blood pressure remains elevated I would increase your Cardura to 2 mg daily.  I will see you again in 6 months and we will check fasting lab work.

## 2011-10-26 NOTE — Assessment & Plan Note (Signed)
Blood pressure is mildly elevated today. He is going to monitor his blood pressure closely. There remains elevated I would increase his Cardura to 2 mg each bedtime.

## 2011-10-26 NOTE — Assessment & Plan Note (Signed)
He is currently on herbal therapy. He does not want to take statin therapy. We'll followup his lab work in 6 months. I stressed the importance of regular exercise and keeping his weight and control.

## 2011-10-26 NOTE — Assessment & Plan Note (Signed)
He has no anginal symptoms currently. We will continue with his anti-anginal therapy including aspirin, atenolol, and amlodipine. I will followup again in 6 months and we will check fasting lab work at that time.

## 2011-10-26 NOTE — Progress Notes (Signed)
[name] Date of Birth: Mar 27, 1925 Medical Record #[id number]  History of Present Illness: Patrick Rodriguez is seen for followup today. He was hospitalized in September with an infected toe which was related to him stepping on a toothpick. He required multiple courses of antibiotics to get this to clear. This has limited his activity and he's not been able to do his regular walking. With this his Cardura was discontinued and his blood pressure increased significantly. He is back taking 1 mg at night. His blood pressure has improved but occasionally still spikes in the morning.  Current Outpatient Prescriptions on File Prior to Visit  Medication Sig Dispense Refill  . albuterol (PROVENTIL HFA;VENTOLIN HFA) 108 (90 BASE) MCG/ACT inhaler Inhale 2 puffs into the lungs every 6 (six) hours as needed.  1 Inhaler  6  . amLODipine (NORVASC) 5 MG tablet Take 1 tablet (5 mg total) by mouth daily.  30 tablet  11  . Arginine 500 MG CAPS Take by mouth daily.        . Ascorbic Acid (VITAMIN C) 1000 MG tablet Take 1,000 mg by mouth daily.        Marland Kitchen aspirin 81 MG tablet Take 162 mg by mouth daily.        Marland Kitchen atenolol (TENORMIN) 25 MG tablet Take 1 tablet (25 mg total) by mouth daily.  30 tablet  5  . b complex vitamins tablet Take 1 tablet by mouth daily.        . Beta Sitosterol 40 % POWD        . CALCIUM PO Take 1,000 mg by mouth daily.        . Cholecalciferol (VITAMIN D) 400 UNITS capsule Take 400 Units by mouth daily.        . Coenzyme Q10 (COQ10 PO) Take by mouth daily.        . Deoxyribose (DEOXY-D-RIBOSE) POWD as directed.        . doxazosin (CARDURA) 1 MG tablet Take 1 tablet (1 mg total) by mouth at bedtime.  30 tablet  5  . Edetic Acid (EDTA) POWD as directed.        Marland Kitchen Hawthorn 565 MG CAPS Take by mouth daily.        Marland Kitchen losartan-hydrochlorothiazide (HYZAAR) 100-25 MG per tablet Take 1 tablet by mouth daily.  30 tablet  5  . magnesium gluconate (MAGONATE) 500 MG tablet Take 500 mg by mouth daily.        .  mometasone (ASMANEX) 220 MCG/INH inhaler Inhale 2 puffs into the lungs at bedtime.  1 Inhaler  6  . Multiple Vitamin (MULTIVITAMIN) tablet Take 1 tablet by mouth daily.        . Nattokinase 100 MG CAPS Take by mouth daily.        . NON FORMULARY T- AMP.  Take as directed       . Omega-3 Fatty Acids (SALMON OIL PO) Take 1,080 mg by mouth daily.        Marland Kitchen omeprazole (PRILOSEC) 20 MG capsule        . Potassium 99 MG TABS Take by mouth daily.        . Red Yeast Rice 600 MG CAPS Take by mouth daily.        Marland Kitchen VITAMIN A PO Take 5,000 Units by mouth daily.        . vitamin B-12 (CYANOCOBALAMIN) 1000 MCG tablet Take 1,000 mcg by mouth daily.        Marland Kitchen  vitamin E 400 UNIT capsule Take 400 Units by mouth daily.        Marland Kitchen zinc gluconate 50 MG tablet Take 50 mg by mouth daily.          No Known Allergies  Past Medical History  Diagnosis Date  . Hyperlipidemia   . Hypertension   . Coronary artery disease   . Obesity   . ED (erectile dysfunction)   . Myocardial infarction   . Allergic rhinitis     Past Surgical History  Procedure Date  . Cardiac catheterization 01/20/1999    EF 60%  . Tonsillectomy   . Appendectomy   . Doppler echocardiography 02/06/1998    EF 60%  . Cardiovascular stress test 04/19/2007    EF 74%  . Coronary stent placement 2000    LAD    History  Smoking status  . Former Smoker -- 2.0 packs/day for 28 years  . Types: Cigarettes  . Quit date: 12/06/1968  Smokeless tobacco  . Not on file    History  Alcohol Use No    Family History  Problem Relation Age of Onset  . Aneurysm Mother   . Stomach cancer Father     Review of Systems: The review of systems is positive for some weight gain.  All other systems were reviewed and are negative.  Physical Exam: BP 148/70  Pulse 68  Ht 5' 8.5" (1.74 m)  Wt 177 lb 12.8 oz (80.65 kg)  BMI 26.64 kg/m2 He is a pleasant white male in no acute distress.The patient is alert and oriented x 3.  The mood and affect are normal.   The skin is warm and dry.  Color is normal.  The HEENT exam reveals that the sclera are nonicteric.  The mucous membranes are moist.  The carotids are 2+ without bruits.  There is no thyromegaly.  There is no JVD.  The lungs are clear.  The chest wall is non tender.  The heart exam reveals a regular rate with a normal S1 and S2.  There are no murmurs, gallops, or rubs.  The PMI is not displaced.   Abdominal exam reveals good bowel sounds.  There is no guarding or rebound.  There is no hepatosplenomegaly or tenderness.  There are no masses.  Exam of the legs reveal no clubbing, cyanosis, or edema.  The legs are without rashes.  The distal pulses are intact.  Cranial nerves II - XII are intact.  Motor and sensory functions are intact.  The gait is normal.  LABORATORY DATA:   Assessment / Plan:

## 2011-11-08 ENCOUNTER — Other Ambulatory Visit: Payer: Self-pay | Admitting: Cardiology

## 2011-11-08 ENCOUNTER — Telehealth: Payer: Self-pay | Admitting: Pulmonary Disease

## 2011-11-08 MED ORDER — DOXAZOSIN MESYLATE 2 MG PO TABS: 2.0000 mg | ORAL_TABLET | Freq: Every day | ORAL | Status: DC

## 2011-11-08 MED ORDER — MOMETASONE FUROATE 220 MCG/INH IN AEPB: 2.0000 | INHALATION_SPRAY | Freq: Every day | RESPIRATORY_TRACT | Status: DC

## 2011-11-08 NOTE — Telephone Encounter (Signed)
Called requesting Rx sent to [facility] for Cardura 2 mg. Dr. [place] increased him at last OV. Sent to [facility].

## 2011-11-08 NOTE — Telephone Encounter (Signed)
Pt was in 2 weeks ago and dr [place] said ok to increase  cardura from 1mg  to 2mg  needs refill to [facility], will be out tomorrow

## 2011-11-08 NOTE — Telephone Encounter (Signed)
Spoke with pt's wife and she confirmed pt needing a replacement for Asmanex due to cost of co-pay. I called and asked pharmacist at [facility] what alternative insurance will cover and she stated the insurance did not advise. Sample of Asmanex [place] left at front desk. Pt aware KC is out of the office and will address as soon as he can upon return. KC please advise an alternative pt can try, thanks.

## 2011-11-09 MED ORDER — BECLOMETHASONE DIPROPIONATE 80 MCG/ACT IN AERS: 2.0000 | INHALATION_SPRAY | Freq: Two times a day (BID) | RESPIRATORY_TRACT | Status: DC

## 2011-11-09 NOTE — Telephone Encounter (Signed)
lmomtcb x1 

## 2011-11-09 NOTE — Telephone Encounter (Signed)
Returning call can be reached at [phone].Patrick Rodriguez

## 2011-11-09 NOTE — Telephone Encounter (Signed)
We can call in qvar  2 inhalations am and pm everyday, #1, 6 fills.  Let him know all cheaper alternatives will be twice a day medication.

## 2011-11-09 NOTE — Telephone Encounter (Signed)
I spoke with pt and he states he would like the rx called in fro the qvar 80. Pt aware of directions and to wash mouth after each use. Nothing further was needed

## 2011-12-01 ENCOUNTER — Other Ambulatory Visit: Payer: Self-pay | Admitting: *Deleted

## 2011-12-01 ENCOUNTER — Telehealth: Payer: Self-pay | Admitting: Pulmonary Disease

## 2011-12-01 MED ORDER — ATENOLOL 25 MG PO TABS: 25.0000 mg | ORAL_TABLET | Freq: Every day | ORAL | Status: DC

## 2011-12-01 NOTE — Telephone Encounter (Signed)
I spoke with pt and he c/o ocugh w/ grey to white phlem, wheezing comes and goes w/ chest tightness. Pt states he finished zpak yesterday. Pt denies any fever, nausea, vomiting, body aches, sore throat. Pt is scheduled to see RA Friday 12/03/11 at 3:00 to be evaluated. Pt aware if he can't wait until then then to seek emergency care. He voiced his understanding

## 2011-12-03 ENCOUNTER — Ambulatory Visit (INDEPENDENT_AMBULATORY_CARE_PROVIDER_SITE_OTHER): Payer: Medicare Other | Admitting: Pulmonary Disease

## 2011-12-03 ENCOUNTER — Encounter: Payer: Self-pay | Admitting: Pulmonary Disease

## 2011-12-03 DIAGNOSIS — J209 Acute bronchitis, unspecified: Secondary | ICD-10-CM

## 2011-12-03 DIAGNOSIS — J45909 Unspecified asthma, uncomplicated: Secondary | ICD-10-CM

## 2011-12-03 MED ORDER — PREDNISONE 10 MG PO TABS: ORAL_TABLET | ORAL | Status: DC

## 2011-12-03 NOTE — Progress Notes (Signed)
  Subjective:    Patient ID: Patrick Rodriguez, male    DOB: 05-10-1925, 77 y.o.   MRN: [id number]  HPI 77 year old male, never smoker with  chronic bronchitis.In 2009, he was found to have moderate to severe airflow obstruction that was felt to be secondary to asthma and emphysema. He was started on Asmanex as a trial, and asked to followup within 4-6 months.  He states that he did well on Asmanex, but then began to only use it as needed. Seen 10/12 for flare  >> prednisone, zpak, asmanex >> changed to qvar due to copay, hoarseness, albuterol MDI  12/03/2011 Acute visit - KC pt 9/12 >> abx for cellulitis, feels may have weakened his immune system c/o cough w/ grey to white phlem, wheezing comes and goes w/ chest tightness. Pt states he finished zpak yesterday. Pt denies any fever, nausea, vomiting, body aches, sore throat     Review of Systems  Constitutional: Negative for fever and unexpected weight change.  HENT: Positive for congestion, rhinorrhea, sneezing and postnasal drip. Negative for sore throat, trouble swallowing, dental problem and sinus pressure.   Eyes: Negative for redness and itching.  Respiratory: Positive for cough, chest tightness, shortness of breath and wheezing.   Cardiovascular: Negative for leg swelling.  Gastrointestinal: Negative for nausea and vomiting.  Genitourinary: Negative for dysuria.  Musculoskeletal: Negative for joint swelling.  Skin: Positive for rash.  Neurological: Negative for headaches.  Hematological: Does not bruise/bleed easily.  Psychiatric/Behavioral: Positive for dysphoric mood. The patient is not nervous/anxious.        Objective:   Physical Exam  Gen. Pleasant, well-nourished, in no distress ENT - no lesions, no post nasal drip Neck: No JVD, no thyromegaly, no carotid bruits Lungs: no use of accessory muscles, no dullness to percussion, clear without rales, faint  Scattered rhonchi on forced expiration  Cardiovascular: Rhythm regular,  heart sounds  normal, no murmurs or gallops, no peripheral edema Musculoskeletal: No deformities, no cyanosis or clubbing        Assessment & Plan:

## 2011-12-03 NOTE — Patient Instructions (Signed)
You have acute bronchitis Prednisone 10 mg tabs as directed Take 4 tabs  daily with food x 4 days, then 3 tabs daily x 4 days, then 2 tabs daily x 4 days, then 1 tab daily x4 days then stop. #40 Take albuterol rescue inhaler - 2 puffs as needed for wheezing Ok to take robitussin-DM  2 tsp thrice daily for cough

## 2011-12-04 NOTE — Assessment & Plan Note (Signed)
Stay on qvar & rescue albuterol as needed

## 2011-12-04 NOTE — Assessment & Plan Note (Signed)
  Prednisone 10 mg tabs as directed Take 4 tabs  daily with food x 4 days, then 3 tabs daily x 4 days, then 2 tabs daily x 4 days, then 1 tab daily x4 days then stop. #40 Take albuterol rescue inhaler - 2 puffs as needed for wheezing Ok to take robitussin-DM  2 tsp thrice daily for cough

## 2011-12-07 DIAGNOSIS — R55 Syncope and collapse: Secondary | ICD-10-CM

## 2011-12-07 DIAGNOSIS — IMO0001 Reserved for inherently not codable concepts without codable children: Secondary | ICD-10-CM

## 2011-12-07 HISTORY — DX: Syncope and collapse: R55

## 2011-12-07 HISTORY — DX: Reserved for inherently not codable concepts without codable children: IMO0001

## 2011-12-29 ENCOUNTER — Ambulatory Visit (INDEPENDENT_AMBULATORY_CARE_PROVIDER_SITE_OTHER): Payer: Medicare Other | Admitting: Nurse Practitioner

## 2011-12-29 ENCOUNTER — Encounter: Payer: Self-pay | Admitting: Nurse Practitioner

## 2011-12-29 VITALS — BP 122/70 | HR 68 | Ht 69.0 in | Wt 175.0 lb

## 2011-12-29 DIAGNOSIS — E871 Hypo-osmolality and hyponatremia: Secondary | ICD-10-CM

## 2011-12-29 DIAGNOSIS — I10 Essential (primary) hypertension: Secondary | ICD-10-CM

## 2011-12-29 DIAGNOSIS — R55 Syncope and collapse: Secondary | ICD-10-CM

## 2011-12-29 DIAGNOSIS — I2510 Atherosclerotic heart disease of native coronary artery without angina pectoris: Secondary | ICD-10-CM

## 2011-12-29 LAB — BASIC METABOLIC PANEL
BUN: 13 mg/dL (ref 6–23)
CO2: 27 mEq/L (ref 19–32)
Calcium: 9 mg/dL (ref 8.4–10.5)
Chloride: 95 mEq/L — ABNORMAL LOW (ref 96–112)
Creatinine, Ser: 1 mg/dL (ref 0.4–1.5)
GFR: 75.55 mL/min (ref >60.00)
Glucose, Bld: 109 mg/dL — ABNORMAL HIGH (ref 70–99)
Potassium: 4.1 mEq/L (ref 3.5–5.1)
Sodium: 130 mEq/L — ABNORMAL LOW (ref 135–145)

## 2011-12-29 MED ORDER — LOSARTAN POTASSIUM 100 MG PO TABS: 100.0000 mg | ORAL_TABLET | Freq: Every day | ORAL | Status: DC

## 2011-12-29 NOTE — Assessment & Plan Note (Signed)
Blood pressure is very good today. We will be changing to Losartan and stopping the HCTZ due to reported hyponatremia. He will continue to monitor his blood pressure at home and bring in readings at next week's visit.

## 2011-12-29 NOTE — Progress Notes (Signed)
[name] Date of Birth: 13-Aug-1925 Medical Record #[id number]  History of Present Illness: Patrick Rodriguez is seen today for a work in visit. He is seen for Dr. Swaziland. He is a very pleasant 78 year old male who has a known history of CAD with remote stenting of the LAD in 2000. Other issues include HTN and hyperlipidemia.  He comes in today after having a "spell" this past Sunday. 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 He was walking among the members praying when he felt like the "shades were coming down". He did pass out. Duration was about 30 seconds. Bystanders said he had a blank stare. EMS was called. He was taken to [facility]. We do not have those records but he says he was evaluated fully with a negative echo, CXR and carotid doppler. He was told that he had one skip on the monitor.  He was told that his sodium was low (?121) and that he was dehydrated. he was given IV fluids and thinks his sodium got up to 127.  None of his medicines were changed. He is on HCTZ in combination. He was told to follow up here. His last visit with Dr. Swaziland was back in November. Cardura was to have been increased then but I do not think he increased his dose.   He has since been fine. No recurrent spells. Was previously doing well without chest pain. Has had some bronchitis earlier in the winter that was slow to resolve. His infected toe has resolved.   Current Outpatient Prescriptions on File Prior to Visit  Medication Sig Dispense Refill  . albuterol (PROVENTIL HFA;VENTOLIN HFA) 108 (90 BASE) MCG/ACT inhaler Inhale 2 puffs into the lungs every 6 (six) hours as needed.  1 Inhaler  6  . amLODipine (NORVASC) 5 MG tablet Take 1 tablet (5 mg total) by mouth daily.  30 tablet  11  . Arginine 500 MG CAPS Take by mouth daily.        . Ascorbic Acid (VITAMIN C) 1000 MG tablet Take 1,000 mg by mouth daily.        Marland Kitchen aspirin 81 MG tablet Take 162 mg by mouth daily.        Marland Kitchen atenolol  (TENORMIN) 25 MG tablet Take 1 tablet (25 mg total) by mouth daily.  30 tablet  5  . b complex vitamins tablet Take 1 tablet by mouth daily.        . beclomethasone (QVAR) 80 MCG/ACT inhaler Inhale 2 puffs into the lungs 2 (two) times daily.  1 Inhaler  6  . Beta Sitosterol 40 % POWD        . CALCIUM PO Take 1,000 mg by mouth daily.        . Cholecalciferol (VITAMIN D) 400 UNITS capsule Take 400 Units by mouth daily.        . Coenzyme Q10 (COQ10 PO) Take by mouth daily.        . Deoxyribose (DEOXY-D-RIBOSE) POWD as directed.        . Edetic Acid (EDTA) POWD as directed.        Marland Kitchen Hawthorn 565 MG CAPS Take by mouth daily.        . magnesium gluconate (MAGONATE) 500 MG tablet Take 500 mg by mouth daily.        . Multiple Vitamin (MULTIVITAMIN) tablet Take 1 tablet by mouth daily.        . Nattokinase 100  MG CAPS Take by mouth daily.        . Potassium 99 MG TABS Take by mouth daily.        . Red Yeast Rice 600 MG CAPS Take by mouth as needed.       Marland Kitchen VITAMIN A PO Take 5,000 Units by mouth daily.        . vitamin B-12 (CYANOCOBALAMIN) 1000 MCG tablet Take 1,000 mcg by mouth daily.        . vitamin E 400 UNIT capsule Take 400 Units by mouth daily.        Marland Kitchen zinc gluconate 50 MG tablet Take 50 mg by mouth daily.        Marland Kitchen DISCONTD: losartan-hydrochlorothiazide (HYZAAR) 100-25 MG per tablet Take 1 tablet by mouth daily.  30 tablet  5  . DISCONTD: doxazosin (CARDURA) 2 MG tablet Take 1 tablet (2 mg total) by mouth at bedtime.  30 tablet  5  . DISCONTD: omeprazole (PRILOSEC) 20 MG capsule          No Known Allergies  Past Medical History  Diagnosis Date  . Hyperlipidemia   . Hypertension   . Coronary artery disease     s/p stenting of the LAD in 2000  . Obesity   . ED (erectile dysfunction)   . Allergic rhinitis   . Normal nuclear stress test 2008    Past Surgical History  Procedure Date  . Cardiac catheterization 01/20/1999    EF 60%  . Tonsillectomy   . Appendectomy   . Doppler  echocardiography 02/06/1998    EF 60%  . Cardiovascular stress test 04/19/2007    EF 74%  . Coronary stent placement 2000    LAD    History  Smoking status  . Former Smoker -- 2.0 packs/day for 28 years  . Types: Cigarettes  . Quit date: 12/06/1968  Smokeless tobacco  . Not on file    History  Alcohol Use No    Family History  Problem Relation Age of Onset  . Aneurysm Mother   . Stomach cancer Father     Review of Systems: The review of systems is per the HPI.  All other systems were reviewed and are negative.  Physical Exam: BP 122/70  Pulse 68  Ht 5\' 9"  (1.753 m)  Wt 175 lb (79.379 kg)  BMI 25.84 kg/m2 Patient is very pleasant and in no acute distress. He looks younger than his stated age. Skin is warm and dry. Color is normal.  HEENT is unremarkable. Normocephalic/atraumatic. PERRL. Sclera are nonicteric. Neck is supple. No masses. No JVD. Lungs are clear. Cardiac exam shows a regular rate and rhythm. Abdomen is soft. Extremities are without edema. Gait and ROM are intact. No gross neurologic deficits noted.  LABORATORY DATA: BMET is pending   Assessment / Plan:

## 2011-12-29 NOTE — Assessment & Plan Note (Signed)
No chest pain reported. Last stress test in 2008. Will await records from recent admission in [place].

## 2011-12-29 NOTE — Patient Instructions (Signed)
Stop the Hyzaar.  We will put you on Losartan 100 mg daily. This is at the drug store.  I will see you in a week. We are going to check your sodium level today.  Continue to monitor your blood pressure at home. Keep a diary and bring to your next visit.  Call the [facility] office at [phone] if you have any questions, problems or concerns.

## 2011-12-29 NOTE — Assessment & Plan Note (Signed)
Patient has had a brief episode of syncope. Some warning prior to the spell. We do not have his records yet. His medicines were not changed. We will repeat a BMET today to reassess his sodium level. I have stopped the Hyzaar and replaced with just Losartan. I will see him in a week. We will review the records to see if further testing is needed. Patient is agreeable to this plan and will call if any problems develop in the interim.

## 2012-01-05 ENCOUNTER — Ambulatory Visit (INDEPENDENT_AMBULATORY_CARE_PROVIDER_SITE_OTHER): Payer: Medicare Other | Admitting: Nurse Practitioner

## 2012-01-05 ENCOUNTER — Telehealth: Payer: Self-pay | Admitting: Nurse Practitioner

## 2012-01-05 ENCOUNTER — Other Ambulatory Visit: Payer: Self-pay | Admitting: *Deleted

## 2012-01-05 ENCOUNTER — Encounter: Payer: Self-pay | Admitting: Nurse Practitioner

## 2012-01-05 VITALS — BP 158/62 | HR 66 | Ht 69.0 in | Wt 177.4 lb

## 2012-01-05 DIAGNOSIS — R55 Syncope and collapse: Secondary | ICD-10-CM

## 2012-01-05 DIAGNOSIS — I10 Essential (primary) hypertension: Secondary | ICD-10-CM

## 2012-01-05 LAB — BASIC METABOLIC PANEL
BUN: 20 mg/dL (ref 6–23)
CO2: 27 mEq/L (ref 19–32)
Calcium: 8.8 mg/dL (ref 8.4–10.5)
Chloride: 99 mEq/L (ref 96–112)
Creatinine, Ser: 1.1 mg/dL (ref 0.4–1.5)
GFR: 68.39 mL/min (ref >60.00)
Glucose, Bld: 99 mg/dL (ref 70–99)
Potassium: 4 mEq/L (ref 3.5–5.1)
Sodium: 133 mEq/L — ABNORMAL LOW (ref 135–145)

## 2012-01-05 MED ORDER — AMLODIPINE BESYLATE 5 MG PO TABS: 5.0000 mg | ORAL_TABLET | Freq: Two times a day (BID) | ORAL | Status: DC

## 2012-01-05 NOTE — Patient Instructions (Signed)
Let's increase the Norvasc to two times a day  Keep checking your blood pressure and bring in your readings for [place] to see.  I will see you in 3 weeks.  Call the [facility] office at [phone] if you have any questions, problems or concerns.

## 2012-01-05 NOTE — Assessment & Plan Note (Signed)
Blood pressure is trending up at home to the 180's systolic. We will increase the Norvasc to BID. We will see him again in about 3 weeks. He is to continue to monitor at home. Patient is agreeable to this plan and will call if any problems develop in the interim.

## 2012-01-05 NOTE — Progress Notes (Signed)
Patrick Rodriguez Date of Birth: Nov 21, 1925 Medical Record #[id number]  History of Present Illness: [name] is seen back today for a one week check. He is seen for Dr. Swaziland. He has had a recent syncopal episode and was hospitalized at [facility] in [place]. His records are reviewed following his visit today. It was their feeling that this was vasovagal due to low sodium levels and probable dehydration. We did stop his HCTZ for a sodium of 127. We left him on his Cozaar.   He comes back today for a recheck. He has had no more spells. Trying to drink more water. No chest pain or shortness of breath. Has been checking his blood pressure and it is trending up to the 180's. He has had his cuff checked in the past.   Current Outpatient Prescriptions on File Prior to Visit  Medication Sig Dispense Refill  . albuterol (PROVENTIL HFA;VENTOLIN HFA) 108 (90 BASE) MCG/ACT inhaler Inhale 2 puffs into the lungs every 6 (six) hours as needed.  1 Inhaler  6  . Arginine 500 MG CAPS Take by mouth daily.        . Ascorbic Acid (VITAMIN C) 1000 MG tablet Take 1,000 mg by mouth daily.        Marland Kitchen atenolol (TENORMIN) 25 MG tablet Take 1 tablet (25 mg total) by mouth daily.  30 tablet  5  . b complex vitamins tablet Take 1 tablet by mouth daily.        . beclomethasone (QVAR) 80 MCG/ACT inhaler Inhale 2 puffs into the lungs 2 (two) times daily.  1 Inhaler  6  . Beta Sitosterol 40 % POWD        . CALCIUM PO Take 1,000 mg by mouth daily.        . Cholecalciferol (VITAMIN D) 400 UNITS capsule Take 400 Units by mouth daily.        . Coenzyme Q10 (COQ10 PO) Take by mouth daily.        . Deoxyribose (DEOXY-D-RIBOSE) POWD as directed.        . doxazosin (CARDURA) 1 MG tablet Take 1 mg by mouth at bedtime.      . Edetic Acid (EDTA) POWD as directed.        Marland Kitchen Hawthorn 565 MG CAPS Take by mouth daily.        Marland Kitchen losartan (COZAAR) 100 MG tablet Take 1 tablet (100 mg total) by mouth daily.  30 tablet  6  .  magnesium gluconate (MAGONATE) 500 MG tablet Take 500 mg by mouth daily.        . Multiple Vitamin (MULTIVITAMIN) tablet Take 1 tablet by mouth daily.        . Nattokinase 100 MG CAPS Take by mouth daily.        Marland Kitchen omeprazole (PRILOSEC) 40 MG capsule Take 40 mg by mouth daily.      . Potassium 99 MG TABS Take by mouth daily.        . Red Yeast Rice 600 MG CAPS Take by mouth as needed.       Marland Kitchen VITAMIN A PO Take 5,000 Units by mouth daily.        . vitamin B-12 (CYANOCOBALAMIN) 1000 MCG tablet Take 1,000 mcg by mouth daily.        . vitamin E 400 UNIT capsule Take 400 Units by mouth daily.        Marland Kitchen zinc gluconate 50 MG tablet Take 50 mg  by mouth daily.        Marland Kitchen DISCONTD: amLODipine (NORVASC) 5 MG tablet Take 1 tablet (5 mg total) by mouth daily.  30 tablet  11  . DISCONTD: losartan-hydrochlorothiazide (HYZAAR) 100-25 MG per tablet Take 1 tablet by mouth daily.  30 tablet  5    Allergies  Allergen Reactions  . Hctz (Hydrochlorothiazide)     Has had hyponatremia    Past Medical History  Diagnosis Date  . Hyperlipidemia   . Hypertension   . Coronary artery disease     s/p stenting of the LAD in 2000  . Obesity   . ED (erectile dysfunction)   . Allergic rhinitis   . Normal nuclear stress test 2008  . Syncope Jan 2013    felt to be vasovagal  . GERD (gastroesophageal reflux disease)   . Normal echocardiogram Jan 2013    EF of 55% and no wall motion abnormalities  . Carotid artery stenosis Jan 2013    50-69% on the left and 1-49% on the right    Past Surgical History  Procedure Date  . Cardiac catheterization 01/20/1999    EF 60%  . Tonsillectomy   . Appendectomy   . Doppler echocardiography 02/06/1998    EF 60%  . Cardiovascular stress test 04/19/2007    EF 74%  . Coronary stent placement 2000    LAD    History  Smoking status  . Former Smoker -- 2.0 packs/day for 28 years  . Types: Cigarettes  . Quit date: 12/06/1968  Smokeless tobacco  . Not on file    History    Alcohol Use No    Family History  Problem Relation Age of Onset  . Aneurysm Mother   . Stomach cancer Father     Review of Systems: The review of systems is positive for some mild early morning "vertigo".  All other systems were reviewed and are negative.  Physical Exam: BP 158/62  Pulse 66  Ht 5\' 9"  (1.753 m)  Wt 177 lb 6.4 oz (80.468 kg)  BMI 26.20 kg/m2 Patient is very pleasant and in no acute distress. Skin is warm and dry. Color is normal.  HEENT is unremarkable. Normocephalic/atraumatic. PERRL. Sclera are nonicteric. Neck is supple. No masses. No JVD. Lungs are clear. Cardiac exam shows a regular rate and rhythm. Abdomen is soft. Extremities are without edema. Gait and ROM are intact. No gross neurologic deficits noted.   LABORATORY DATA: Repeat BMET is pending.    Assessment / Plan:

## 2012-01-05 NOTE — Assessment & Plan Note (Addendum)
He has not had recurrence. It was their feeling in [place] that this was vasovagal due to low sodium and dehydration. He is no longer on his HCTZ. After he left the office today, his records did come for review. He had a normal echocardiogram. He had his carotids checked. He was on the monitor and noted to have a first degree AV block. There is mention (but no tracing) of second degree type 1 AV block. I have reviewed this with Dr. Swaziland. He feels like we can follow Patrick Rodriguez clinically. If he does have a recurrence, he will need an event monitor. He remains asymptomatic now.

## 2012-01-05 NOTE — Telephone Encounter (Addendum)
ROI faxed to [facility] @ [phone] 01/05/12/km  Received records gave to Lori/Sonja1/30/13/km

## 2012-01-26 ENCOUNTER — Ambulatory Visit (INDEPENDENT_AMBULATORY_CARE_PROVIDER_SITE_OTHER): Payer: Medicare Other | Admitting: Nurse Practitioner

## 2012-01-26 ENCOUNTER — Encounter: Payer: Self-pay | Admitting: Nurse Practitioner

## 2012-01-26 VITALS — BP 122/68 | HR 64 | Ht 69.0 in | Wt 179.0 lb

## 2012-01-26 DIAGNOSIS — I10 Essential (primary) hypertension: Secondary | ICD-10-CM

## 2012-01-26 DIAGNOSIS — R55 Syncope and collapse: Secondary | ICD-10-CM

## 2012-01-26 NOTE — Patient Instructions (Signed)
Stay on your current medicines.  Support stockings are encouraged.  We will see you back in 3 to 4 months.  Call the [facility] office at [phone] if you have any questions, problems or concerns.

## 2012-01-26 NOTE — Assessment & Plan Note (Signed)
Blood pressure has improved with the increase of his Norvasc. He does have some occasional edema. He is going to use support stockings. He will let us know if he wishes to have his medicines changed. It is not a significant issue for him at this time. He will continue to monitor his blood pressure at home. We will see him back in 3 to 4 months.  Patient is agreeable to this plan and will call if any problems develop in the interim.

## 2012-01-26 NOTE — Assessment & Plan Note (Signed)
Doing well without recurrence. Remains off of HCTZ. Will see him back in 3 months. He is to let us know if he has any recurrent symptoms. Would proceed with event monitor if he does.

## 2012-01-26 NOTE — Progress Notes (Signed)
Patrick Rodriguez Date of Birth: 1925-04-13 Medical Record #[id number]  History of Present Illness: Patrick Rodriguez is seen today for a follow up visit. He is seen for Dr. Sw[place]il[place]. He has had a recent syncopal episode while working in [place]. It was felt to be vasovagal in etiology. Records were received and reviewed after his last visit. Sodium was low. He had a normal echo. Carotids ok. Did have 1st degree AV block. Also mentioned was 2nd degree type 1 AV block but no tracings. He is no longer on his HCTZ. Norvasc was increased at his last visit for his blood pressure.  He comes in today. He is feeling well. No recurrent syncope. Not lightheaded or dizzy. No chest pain. Energy level is good. Blood pressure has improved. He does note some occasional swelling that comes and goes. He does not use support stockings.   Current Outpatient Prescriptions on File Prior to Visit  Medication Sig Dispense Refill  . albuterol (PROVENTIL HFA;VENTOLIN HFA) 108 (90 BASE) MCG/ACT inhaler Inhale 2 puffs into the lungs every 6 (six) hours as needed.  1 Inhaler  6  . amLODipine (NORVASC) 5 MG tablet Take 1 tablet (5 mg total) by mouth 2 (two) times daily.  60 tablet  11  . Arginine 500 MG CAPS Take by mouth daily.        . Ascorbic Acid (VITAMIN C) 1000 MG tablet Take 1,000 mg by mouth daily.        Marland Kitchen aspirin 325 MG tablet Take 325 mg by mouth daily. 1/2 tablet daily      . atenolol (TENORMIN) 25 MG tablet Take 1 tablet (25 mg total) by mouth daily.  30 tablet  5  . b complex vitamins tablet Take 1 tablet by mouth daily.        . beclomethasone (QVAR) 80 MCG/ACT inhaler Inhale 2 puffs into the lungs 2 (two) times daily.  1 Inhaler  6  . Beta Sitosterol 40 % POWD        . CALCIUM PO Take 1,000 mg by mouth daily.        . Cholecalciferol (VITAMIN D) 400 UNITS capsule Take 400 Units by mouth daily.        . Coenzyme Q10 (COQ10 PO) Take by mouth daily.        . Deoxyribose (DEOXY-D-RIBOSE) POWD as directed.        . Edetic Acid  (EDTA) POWD as directed.        Marland Kitchen Hawthorn 565 MG CAPS Take by mouth daily.        Marland Kitchen losartan (COZAAR) 100 MG tablet Take 1 tablet (100 mg total) by mouth daily.  30 tablet  6  . magnesium gluconate (MAGONATE) 500 MG tablet Take 500 mg by mouth daily.        . Multiple Vitamin (MULTIVITAMIN) tablet Take 1 tablet by mouth daily.        . Nattokinase 100 MG CAPS Take by mouth daily.        Marland Kitchen omeprazole (PRILOSEC) 40 MG capsule Take 40 mg by mouth daily.      . Potassium 99 MG TABS Take by mouth daily.        . Red Yeast Rice 600 MG CAPS Take by mouth as needed.       Marland Kitchen VITAMIN A PO Take 5,000 Units by mouth daily.        . vitamin B-12 (CYANOCOBALAMIN) 1000 MCG tablet Take 1,000 mcg by mouth daily.        Marland Kitchen  vitamin E 400 UNIT capsule Take 400 Units by mouth daily.        Marland Kitchen zinc gluconate 50 MG tablet Take 50 mg by mouth daily.        Marland Kitchen doxazosin (CARDURA) 1 MG tablet Take 1 mg by mouth at bedtime.      Marland Kitchen DISCONTD: losartan-hydrochlorothiazide (HYZAAR) 100-25 MG per tablet Take 1 tablet by mouth daily.  30 tablet  5    Allergies  Allergen Reactions  . Hctz (Hydrochlorothiazide)     Has had hyponatremia    Past Medical History  Diagnosis Date  . Hyperlipidemia   . Hypertension   . Coronary artery disease     s/p stenting of the LAD in 2000  . Obesity   . ED (erectile dysfunction)   . Allergic rhinitis   . Normal nuclear stress test 2008  . Syncope Jan 2013    felt to be vasovagal; had normal echo, carotids ok, 1st degree AV block with mention (but no tracing) of 2nd degree type I AV block while in [place]  . GERD (gastroesophageal reflux disease)   . Normal echocardiogram Jan 2013    EF of 55% and no wall motion abnormalities  . Carotid artery stenosis Jan 2013    50-69% on the left and 1-49% on the right    Past Surgical History  Procedure Date  . Cardiac catheterization 01/20/1999    EF 60%  . Tonsillectomy   . Appendectomy   . Doppler echocardiography 02/06/1998    EF 60%  .  Cardiovascular stress test 04/19/2007    EF 74%  . Coronary stent placement 2000    LAD    History  Smoking status  . Former Smoker -- 2.0 packs/day for 28 years  . Types: Cigarettes  . Quit date: 12/06/1968  Smokeless tobacco  . Not on file    History  Alcohol Use No    Family History  Problem Relation Age of Onset  . Aneurysm Mother   . Stomach cancer Father     Review of Systems: The review of systems is positive for occasional edema.  All other systems were reviewed and are negative.  Physical Exam: BP 122/68  Pulse 64  Ht 5\' 9"  (1.753 m)  Wt 179 lb (81.194 kg)  BMI 26.43 kg/m2 Patient is very pleasant and in no acute distress. Skin is warm and dry. Color is normal.  HEENT is unremarkable. Normocephalic/atraumatic. PERRL. Sclera are nonicteric. Neck is supple. No masses. No JVD. Lungs are clear. Cardiac exam shows a regular rate and rhythm. Abdomen is soft. Extremities are without edema. Gait and ROM are intact. No gross neurologic deficits noted.   LABORATORY DATA:   Assessment / Plan:

## 2012-03-06 ENCOUNTER — Ambulatory Visit (INDEPENDENT_AMBULATORY_CARE_PROVIDER_SITE_OTHER): Payer: Medicare Other | Admitting: Pulmonary Disease

## 2012-03-06 ENCOUNTER — Encounter: Payer: Self-pay | Admitting: Pulmonary Disease

## 2012-03-06 VITALS — BP 114/76 | HR 69 | Temp 97.9°F | Ht 69.0 in | Wt 184.8 lb

## 2012-03-06 DIAGNOSIS — J45909 Unspecified asthma, uncomplicated: Secondary | ICD-10-CM

## 2012-03-06 DIAGNOSIS — I219 Acute myocardial infarction, unspecified: Secondary | ICD-10-CM

## 2012-03-06 NOTE — Patient Instructions (Signed)
Stop qvar for now, and start symbicort 160/4.5  2 inhalations am and pm everyday!!  Rinse mouth well after using.  Can call in prescription if it helps you.  Will try spacer to see if helps with hoarsenss from inhaler. Stay as active as possible, and work on weight loss.  followup with me in 6mos if doing well, but call if any questions.

## 2012-03-06 NOTE — Assessment & Plan Note (Signed)
The patient feels that he is having worsening of his exertional tolerance, and is also having issues with his steroid inhaler related to hoarseness despite rinsing well.  I have always felt that he would do better on a LABA/ICS, but expense has always been an issue.  I would like to try him on symbicort at this time, and will also use a spacer to see if this will help reduce upper airway irritation.  I have also encouraged him to work on weight reduction and some type of exercise program.

## 2012-03-06 NOTE — Progress Notes (Signed)
  Subjective:    Patient ID: Patrick Rodriguez, male    DOB: 17-Oct-1925, 78 y.o.   MRN: [id number]  HPI The patient comes in today for followup of his asthma/emphysema.  We have been trying to maintain him on an inhaled corticosteroid alone, but he has continued to have breakthrough symptoms.  Part of the issue is related to discontinuation of medication periodically, and the patient relates this to hoarseness despite rinsing well after using his inhaler.  He has never used a spacer.  He continues to have dyspnea on exertion but again, doesn't always have compliance with his inhaler regimen.   Review of Systems  Constitutional: Negative.  Negative for fever and unexpected weight change.  HENT: Negative.  Negative for ear pain, nosebleeds, congestion, sore throat, rhinorrhea, sneezing, trouble swallowing, dental problem, postnasal drip and sinus pressure.   Eyes: Negative.  Negative for redness and itching.  Respiratory: Positive for shortness of breath. Negative for cough, chest tightness and wheezing.   Cardiovascular: Negative.  Negative for palpitations and leg swelling.  Gastrointestinal: Negative.  Negative for nausea and vomiting.  Genitourinary: Negative.  Negative for dysuria.  Musculoskeletal: Negative.  Negative for joint swelling.  Skin: Positive for rash.  Neurological: Negative.  Negative for headaches.  Hematological: Bruises/bleeds easily.  Psychiatric/Behavioral: Negative.  Negative for dysphoric mood. The patient is not nervous/anxious.        Objective:   Physical Exam Overweight male in no acute distress Nose without purulence or discharge noted, no thrush Chest with adequate airflow, no obvious wheezing or rhonchi Cardiac exam with regular rate and rhythm, but distant Lower extremities with 1+ ankle edema, no cyanosis Alert and oriented, moves all 4 extremities.       Assessment & Plan:

## 2012-03-23 ENCOUNTER — Telehealth: Payer: Self-pay | Admitting: Pulmonary Disease

## 2012-03-23 MED ORDER — BUDESONIDE-FORMOTEROL FUMARATE 160-4.5 MCG/ACT IN AERO: 2.0000 | INHALATION_SPRAY | Freq: Two times a day (BID) | RESPIRATORY_TRACT | Status: DC

## 2012-03-23 NOTE — Telephone Encounter (Signed)
I spoke with pt and he states the symbicort has improved his breathing and his hoarseness is not as bad. He states it helped more than the qvar. According to [facility] last note if this has helped then can send in rx for pt. I have sent in rx for [facility] per pt request and will forward to [facility] as an [place].

## 2012-03-24 NOTE — Telephone Encounter (Signed)
Ok to call in symbicort with 6 fills.

## 2012-04-03 ENCOUNTER — Encounter: Payer: Self-pay | Admitting: Nurse Practitioner

## 2012-04-18 ENCOUNTER — Other Ambulatory Visit: Payer: Medicare Other

## 2012-04-18 ENCOUNTER — Other Ambulatory Visit (INDEPENDENT_AMBULATORY_CARE_PROVIDER_SITE_OTHER): Payer: Medicare Other

## 2012-04-18 DIAGNOSIS — E785 Hyperlipidemia, unspecified: Secondary | ICD-10-CM

## 2012-04-18 DIAGNOSIS — I2510 Atherosclerotic heart disease of native coronary artery without angina pectoris: Secondary | ICD-10-CM

## 2012-04-18 LAB — BASIC METABOLIC PANEL
BUN: 16 mg/dL (ref 6–23)
CO2: 25 mEq/L (ref 19–32)
Calcium: 9 mg/dL (ref 8.4–10.5)
Glucose, Bld: 123 mg/dL — ABNORMAL HIGH (ref 70–99)
Sodium: 134 mEq/L — ABNORMAL LOW (ref 135–145)

## 2012-04-18 LAB — LIPID PANEL
Cholesterol: 170 mg/dL (ref 0–200)
HDL: 46.4 mg/dL (ref >39.00)

## 2012-04-18 LAB — HEPATIC FUNCTION PANEL
ALT: 17 U/L (ref 0–53)
AST: 23 U/L (ref 0–37)
Total Bilirubin: 0.6 mg/dL (ref 0.3–1.2)
Total Protein: 6.7 g/dL (ref 6.0–8.3)

## 2012-04-27 ENCOUNTER — Other Ambulatory Visit: Payer: Medicare Other

## 2012-04-27 ENCOUNTER — Encounter: Payer: Self-pay | Admitting: Cardiology

## 2012-04-27 ENCOUNTER — Telehealth: Payer: Self-pay

## 2012-04-27 ENCOUNTER — Ambulatory Visit (INDEPENDENT_AMBULATORY_CARE_PROVIDER_SITE_OTHER): Payer: Medicare Other | Admitting: Cardiology

## 2012-04-27 VITALS — BP 150/64 | HR 74 | Ht 69.0 in | Wt 187.0 lb

## 2012-04-27 DIAGNOSIS — I2510 Atherosclerotic heart disease of native coronary artery without angina pectoris: Secondary | ICD-10-CM

## 2012-04-27 DIAGNOSIS — R609 Edema, unspecified: Secondary | ICD-10-CM

## 2012-04-27 DIAGNOSIS — I10 Essential (primary) hypertension: Secondary | ICD-10-CM

## 2012-04-27 MED ORDER — CARVEDILOL 12.5 MG PO TABS: 12.5000 mg | ORAL_TABLET | Freq: Two times a day (BID) | ORAL | Status: DC

## 2012-04-27 NOTE — Patient Instructions (Signed)
Reduce Norvasc to 5 mg daily.  Stop atenolol  Take carvedilol 12.5 mg twice a day.  We will have you come back in 4 weeks and see how you are doing

## 2012-04-27 NOTE — Telephone Encounter (Signed)
Patient called, stated he picked up the new medication carvedilol and warnings read don't take if you have asthma.Patient was told spoke to Dr.Jordan and he advised to take carvediolol and if he has any symptoms call back.

## 2012-05-01 DIAGNOSIS — R609 Edema, unspecified: Secondary | ICD-10-CM | POA: Insufficient documentation

## 2012-05-01 NOTE — Progress Notes (Signed)
[name] Date of Birth: 05-Feb-1925 Medical Record #[id number]  History of Present Illness: Mr. Rogerson is seen today for a follow up visit. Previously he had a syncopal episode that was felt to be related to dehydration. His HCTZ was discontinued. His Norvasc was increased for blood pressure control. On return today he complains of increased swelling in his legs since this change in his medications. He complains that his legs are breaking out now and the skin is dry and scaly. He has also been followed by pulmonary for asthma/emphysema. He feels that his breathing is back to normal and he is back to walking regularly. He denies any recurrent dizziness. He has had no chest pain.  Current Outpatient Prescriptions on File Prior to Visit  Medication Sig Dispense Refill  . albuterol (PROVENTIL HFA;VENTOLIN HFA) 108 (90 BASE) MCG/ACT inhaler Inhale 2 puffs into the lungs every 6 (six) hours as needed.  1 Inhaler  6  . amLODipine (NORVASC) 5 MG tablet Take 1 tablet (5 mg total) by mouth 2 (two) times daily.  60 tablet  11  . Arginine 500 MG CAPS Take by mouth daily.        . Ascorbic Acid (VITAMIN C) 1000 MG tablet Take 1,000 mg by mouth daily.        Marland Kitchen aspirin 325 MG tablet Take 325 mg by mouth daily. 1/2 tablet daily      . b complex vitamins tablet Take 1 tablet by mouth daily.        . Beta Sitosterol 40 % POWD        . budesonide-formoterol (SYMBICORT) 160-4.5 MCG/ACT inhaler Inhale 2 puffs into the lungs 2 (two) times daily.  1 Inhaler  6  . CALCIUM PO Take 1,000 mg by mouth daily.        . Cholecalciferol (VITAMIN D) 400 UNITS capsule Take 400 Units by mouth daily.        . Coenzyme Q10 (COQ10 PO) Take by mouth daily.        . Deoxyribose (DEOXY-D-RIBOSE) POWD as directed.        . doxazosin (CARDURA) 1 MG tablet Take 2 mg by mouth at bedtime.       . Edetic Acid (EDTA) POWD as directed.        Marland Kitchen Hawthorn 565 MG CAPS Take by mouth daily.        Marland Kitchen losartan (COZAAR) 100 MG tablet Take 1  tablet (100 mg total) by mouth daily.  30 tablet  6  . magnesium gluconate (MAGONATE) 500 MG tablet Take 500 mg by mouth daily.        . Nattokinase 100 MG CAPS Take by mouth daily.        Marland Kitchen omeprazole (PRILOSEC) 40 MG capsule Take 40 mg by mouth daily.      . Potassium 99 MG TABS Take by mouth daily.        . Red Yeast Rice 600 MG CAPS Take by mouth as needed.       Marland Kitchen VITAMIN A PO Take 5,000 Units by mouth daily.        . vitamin B-12 (CYANOCOBALAMIN) 1000 MCG tablet Take 1,000 mcg by mouth daily.        . vitamin E 400 UNIT capsule Take 400 Units by mouth daily.        Marland Kitchen zinc gluconate 50 MG tablet Take 50 mg by mouth daily.        . carvedilol (COREG) 12.5 MG  tablet Take 1 tablet (12.5 mg total) by mouth 2 (two) times daily with a meal.  60 tablet  11  . DISCONTD: losartan-hydrochlorothiazide (HYZAAR) 100-25 MG per tablet Take 1 tablet by mouth daily.  30 tablet  5    Allergies  Allergen Reactions  . Hctz (Hydrochlorothiazide)     Has had hyponatremia    Past Medical History  Diagnosis Date  . Hyperlipidemia   . Hypertension   . Coronary artery disease     s/p stenting of the LAD in 2000  . Obesity   . ED (erectile dysfunction)   . Allergic rhinitis   . Normal nuclear stress test 2008  . Syncope Jan 2013    felt to be vasovagal; had normal echo, carotids ok, 1st degree AV block with mention (but no tracing) of 2nd degree type I AV block while in [place]  . GERD (gastroesophageal reflux disease)   . Normal echocardiogram Jan 2013    EF of 55% and no wall motion abnormalities  . Carotid artery stenosis Jan 2013    50-69% on the left and 1-49% on the right    Past Surgical History  Procedure Date  . Cardiac catheterization 01/20/1999    EF 60%  . Tonsillectomy   . Appendectomy   . Doppler echocardiography 02/06/1998    EF 60%  . Cardiovascular stress test 04/19/2007    EF 74%  . Coronary stent placement 2000    LAD    History  Smoking status  . Former Smoker -- 2.0  packs/day for 28 years  . Types: Cigarettes  . Quit date: 12/06/1968  Smokeless tobacco  . Not on file    History  Alcohol Use No    Family History  Problem Relation Age of Onset  . Aneurysm Mother   . Stomach cancer Father     Review of Systems: The review of systems is positive for  edema.  All other systems were reviewed and are negative.  Physical Exam: BP 150/64  Pulse 74  Ht 5\' 9"  (1.753 m)  Wt 187 lb (84.823 kg)  BMI 27.62 kg/m2 Patient is very pleasant and in no acute distress. Skin is warm and dry. Color is normal.  HEENT is unremarkable. Normocephalic/atraumatic. PERRL. Sclera are nonicteric. Neck is supple. No masses. No JVD. Lungs are clear. Cardiac exam shows a regular rate and rhythm. Abdomen is soft. Extremities reveal 1-2+ edema. Gait and ROM are intact. No gross neurologic deficits noted.   LABORATORY DATA:   Assessment / Plan:

## 2012-05-01 NOTE — Assessment & Plan Note (Signed)
Blood pressure still not optimally controlled despite higher dose of Norvasc. I would like to avoid starting him back on HCTZ given his problems with renal insufficiency and hyponatremia. I recommended switching his atenolol to carvedilol 12.5 mg twice a day. We will followup again in 4 weeks. If need be we can increase his carvedilol further.

## 2012-05-01 NOTE — Assessment & Plan Note (Signed)
I think his edema is mostly due to the higher dose of Norvasc. We will reduce this to 5 mg per day and work on his blood pressure with carvedilol. Have encouraged him to use support stockings. I stressed the importance of sodium restriction.

## 2012-05-01 NOTE — Assessment & Plan Note (Signed)
Is unclear from his records when his last ischemic evaluation was. However he is asymptomatic and on appropriate medical therapy. At his age I am not sure that we need to pursue further evaluation unless he were to develop symptoms.

## 2012-05-09 ENCOUNTER — Other Ambulatory Visit: Payer: Self-pay | Admitting: Cardiology

## 2012-05-24 ENCOUNTER — Other Ambulatory Visit: Payer: Self-pay | Admitting: *Deleted

## 2012-05-24 ENCOUNTER — Encounter: Payer: Self-pay | Admitting: Nurse Practitioner

## 2012-05-24 ENCOUNTER — Ambulatory Visit (INDEPENDENT_AMBULATORY_CARE_PROVIDER_SITE_OTHER): Payer: Medicare Other | Admitting: Nurse Practitioner

## 2012-05-24 VITALS — BP 138/62 | HR 71 | Ht 69.0 in | Wt 181.8 lb

## 2012-05-24 DIAGNOSIS — I10 Essential (primary) hypertension: Secondary | ICD-10-CM

## 2012-05-24 DIAGNOSIS — R609 Edema, unspecified: Secondary | ICD-10-CM

## 2012-05-24 DIAGNOSIS — E782 Mixed hyperlipidemia: Secondary | ICD-10-CM

## 2012-05-24 DIAGNOSIS — I2510 Atherosclerotic heart disease of native coronary artery without angina pectoris: Secondary | ICD-10-CM

## 2012-05-24 NOTE — Progress Notes (Signed)
Patrick Rodriguez Date of Birth: [birth year] Medical Record #[id number]  History of Present Illness: Patrick Rodriguez is seen back today for a 4 week check. He is seen for Patrick Rodriguez. He has a history of HLD, HTN, CAD with remote stenting of the LAD in 2000 with last stress test in 2008, obesity, ED, syncope in 2013 felt to be due to dehydration. HCTZ was stopped at that time. He also has known carotid disease with last duplex in January at the time of his syncopal episode.   He comes in today. He is here alone. Says he is doing well. Has lost 6 pounds. Has been switched over to Coreg BID and the Norvasc was cut back. He is tolerating his current regimen. Blood pressure is doing better at home and here. He feels good. No chest pain. No shortness of breath. He is trying to stay active. Not lightheaded or dizzy. Does like to use salt. His swelling he says is improved. He did not try the support stockings. He is quite happy with his current status.   Current Outpatient Prescriptions on File Prior to Visit  Medication Sig Dispense Refill  . Arginine 500 MG CAPS Take by mouth daily.        . Ascorbic Acid (VITAMIN C) 1000 MG tablet Take 1,000 mg by mouth daily.        Marland Kitchen b complex vitamins tablet Take 1 tablet by mouth daily.        . Beta Sitosterol 40 % POWD        . budesonide-formoterol (SYMBICORT) 160-4.5 MCG/ACT inhaler Inhale 2 puffs into the lungs 2 (two) times daily.  1 Inhaler  6  . CALCIUM PO Take 1,000 mg by mouth daily.        . carvedilol (COREG) 12.5 MG tablet Take 1 tablet (12.5 mg total) by mouth 2 (two) times daily with a meal.  60 tablet  11  . Cholecalciferol (VITAMIN D) 400 UNITS capsule Take 400 Units by mouth daily.        . Coenzyme Q10 (COQ10 PO) Take by mouth daily.        . Deoxyribose (DEOXY-D-RIBOSE) POWD as directed.        . doxazosin (CARDURA) 1 MG tablet Take 2 mg by mouth at bedtime.       Marland Kitchen doxazosin (CARDURA) 2 MG tablet TAKE ONE (1) TABLET BY MOUTH AT BEDTIME  30 tablet  5  .  Edetic Acid (EDTA) POWD as directed.        Marland Kitchen Hawthorn 565 MG CAPS Take by mouth daily.        Marland Kitchen losartan (COZAAR) 100 MG tablet Take 1 tablet (100 mg total) by mouth daily.  30 tablet  6  . magnesium gluconate (MAGONATE) 500 MG tablet Take 500 mg by mouth daily.        . Nattokinase 100 MG CAPS Take by mouth daily.        Marland Kitchen omeprazole (PRILOSEC) 40 MG capsule Take 40 mg by mouth daily.      . Potassium 99 MG TABS Take by mouth daily.        . Red Yeast Rice 600 MG CAPS Take by mouth as needed.       . triamcinolone cream (KENALOG) 0.1 % Apply topically as directed.       Marland Kitchen VITAMIN A PO Take 5,000 Units by mouth daily.        . vitamin B-12 (CYANOCOBALAMIN) 1000 MCG tablet Take  1,000 mcg by mouth daily.        . vitamin E 400 UNIT capsule Take 400 Units by mouth daily.        Marland Kitchen zinc gluconate 50 MG tablet Take 50 mg by mouth daily.        Marland Kitchen DISCONTD: amLODipine (NORVASC) 5 MG tablet Take 1 tablet (5 mg total) by mouth 2 (two) times daily.  60 tablet  11  . albuterol (PROVENTIL HFA;VENTOLIN HFA) 108 (90 BASE) MCG/ACT inhaler Inhale 2 puffs into the lungs every 6 (six) hours as needed.  1 Inhaler  6  . DISCONTD: losartan-hydrochlorothiazide (HYZAAR) 100-25 MG per tablet Take 1 tablet by mouth daily.  30 tablet  5    Allergies  Allergen Reactions  . Hctz (Hydrochlorothiazide)     Has had hyponatremia    Past Medical History  Diagnosis Date  . Hyperlipidemia   . Hypertension   . Coronary artery disease     s/p stenting of the LAD in 2000  . Obesity   . ED (erectile dysfunction)   . Allergic rhinitis   . Normal nuclear stress test 2008  . Syncope Jan 2013    felt to be vasovagal; had normal echo, carotids ok, 1st degree AV block with mention (but no tracing) of 2nd degree type I AV block while in [place]  . GERD (gastroesophageal reflux disease)   . Normal echocardiogram Jan 2013    EF of 55% and no wall motion abnormalities  . Carotid artery stenosis Jan 2013    50-69% on the left and  1-49% on the right    Past Surgical History  Procedure Date  . Cardiac catheterization 01/20/1999    EF 60%  . Tonsillectomy   . Appendectomy   . Doppler echocardiography 02/06/1998    EF 60%  . Cardiovascular stress test 04/19/2007    EF 74%  . Coronary stent placement 2000    LAD    History  Smoking status  . Former Smoker -- 2.0 packs/day for 28 years  . Types: Cigarettes  . Quit date: 12/06/1968  Smokeless tobacco  . Not on file    History  Alcohol Use No    Family History  Problem Relation Age of Onset  . Aneurysm Mother   . Stomach cancer Father     Review of Systems: The review of systems is positive for mild swelling of his feet/ankles.  All other systems were reviewed and are negative.  Physical Exam: BP 138/62  Pulse 71  Ht 5\' 9"  (1.753 m)  Wt 181 lb 12.8 oz (82.464 kg)  BMI 26.85 kg/m2  SpO2 97% Patient is very pleasant and in no acute distress. His weight is down 6 pounds. Skin is warm and dry. Color is normal.  HEENT is unremarkable. Normocephalic/atraumatic. PERRL. Sclera are nonicteric. Neck is supple. No masses. No JVD. Lungs are clear. Cardiac exam shows a regular rate and rhythm. Abdomen is soft. Extremities are with trace edema. Gait and ROM are intact. No gross neurologic deficits noted.   LABORATORY DATA: N/A for today.   Lab Results  Component Value Date   WBC 4.6 08/13/2011   HGB 12.2* 08/13/2011   HCT 33.6* 08/13/2011   PLT 127* 08/13/2011   GLUCOSE 123* 04/18/2012   CHOL 170 04/18/2012   TRIG 73.0 04/18/2012   HDL 46.40 04/18/2012   LDLCALC 109* 04/18/2012   ALT 17 04/18/2012   AST 23 04/18/2012   NA 134* 04/18/2012   K 3.9  04/18/2012   CL 100 04/18/2012   CREATININE 1.3 04/18/2012   BUN 16 04/18/2012   CO2 25 04/18/2012   INR 1.11 08/11/2011     Assessment / Plan:

## 2012-05-24 NOTE — Assessment & Plan Note (Signed)
No chest pain. Last noninvasive test was back in 2008. He is doing well clinically without symptoms.

## 2012-05-24 NOTE — Assessment & Plan Note (Signed)
Swelling has improved with reduction of his Norvasc. He does like salt. Not really interested in using support stockings. He is happy with how he is currently doing.

## 2012-05-24 NOTE — Patient Instructions (Addendum)
I think your blood pressure looks fine now  Stay on your current medicines  We will see you in 6 months with Dr. [place].  Call the [facility] office at [phone] if you have any questions, problems or concerns.

## 2012-05-24 NOTE — Assessment & Plan Note (Signed)
Blood pressure has improved. No change in his current regimen.

## 2012-06-19 ENCOUNTER — Telehealth: Payer: Self-pay | Admitting: Cardiology

## 2012-06-19 NOTE — Telephone Encounter (Signed)
Pt wants talk about carvedilol because he is having side effects

## 2012-06-19 NOTE — Telephone Encounter (Signed)
Patient called stated he thinks he is having side effects from carvedilol,ithcing all over,swollen ankles.Spoke to Norma Fredrickson NP she advised to take benadryl as needed,wear support stockings,send in a B/P log.

## 2012-07-20 ENCOUNTER — Other Ambulatory Visit: Payer: Self-pay | Admitting: *Deleted

## 2012-07-20 MED ORDER — LOSARTAN POTASSIUM 100 MG PO TABS: 100.0000 mg | ORAL_TABLET | Freq: Every day | ORAL | Status: DC

## 2012-07-21 ENCOUNTER — Telehealth: Payer: Self-pay | Admitting: Cardiology

## 2012-07-21 MED ORDER — ATENOLOL 25 MG PO TABS: 25.0000 mg | ORAL_TABLET | Freq: Every day | ORAL | Status: DC

## 2012-07-21 NOTE — Telephone Encounter (Signed)
Please return call to patient [phone]  Patient thinks he may be having a reaction to his meds, would not disclose information.

## 2012-07-21 NOTE — Telephone Encounter (Signed)
Patient called stated he is itching and thinks carvedilol is causing.Spoke to Norma Fredrickson NP she advised stop carvedilol and start back atenolol 25 mg daily.Advised to check B/P in 2 weeks and call office with results.

## 2012-09-06 ENCOUNTER — Ambulatory Visit: Payer: Medicare Other | Admitting: Pulmonary Disease

## 2012-09-19 ENCOUNTER — Telehealth: Payer: Self-pay | Admitting: Cardiology

## 2012-09-19 NOTE — Telephone Encounter (Signed)
Pt calling re a change of meds , thinks he may be having a reaction

## 2012-09-19 NOTE — Telephone Encounter (Signed)
Patient called was told Dr.Jordan advised ok to stop Losartan.Advised to monitor B/P and call back to report condition of rash and if B/P increases.

## 2012-09-19 NOTE — Telephone Encounter (Signed)
Spoke to patient he stated he has a red itchy rash all over his body.States he has had rash for 3 months and Dr.Dan Yetta Barre has tried everything.States Dr.Jones advised to check with Dr.Jordan to see if he can stop lorsartan.Patient was told will check with Dr.Jordan and call him back.

## 2012-09-20 ENCOUNTER — Encounter: Payer: Self-pay | Admitting: Physician Assistant

## 2012-09-20 DIAGNOSIS — L308 Other specified dermatitis: Secondary | ICD-10-CM

## 2012-09-25 ENCOUNTER — Encounter: Payer: Self-pay | Admitting: Pulmonary Disease

## 2012-09-25 ENCOUNTER — Ambulatory Visit (INDEPENDENT_AMBULATORY_CARE_PROVIDER_SITE_OTHER): Payer: Medicare Other | Admitting: Pulmonary Disease

## 2012-09-25 VITALS — BP 140/78 | HR 78 | Temp 98.0°F | Ht 69.0 in | Wt 180.2 lb

## 2012-09-25 DIAGNOSIS — Z23 Encounter for immunization: Secondary | ICD-10-CM

## 2012-09-25 DIAGNOSIS — J45909 Unspecified asthma, uncomplicated: Secondary | ICD-10-CM

## 2012-09-25 NOTE — Assessment & Plan Note (Signed)
The patient has been intolerant of maintenance bronchodilators, and feels that his breathing is adequate without a daily medication.  He is satisfied with his exertional tolerance, and how it fits into his everyday life.  I have explained that we use of maintenance medications to treat symptoms, and also prevent flareups.  As long as these are not occurring, I am okay with leaving him off a maintenance medication.  However, if he starts to have worsening issues, I would recommend nebulized medications twice a day for maintenance.  The patient is agreeable to this approach.

## 2012-09-25 NOTE — Progress Notes (Signed)
  Subjective:    Patient ID: Patrick Rodriguez, male    DOB: 1925/11/15, 78 y.o.   MRN: [id number]  HPI The patient comes in today for followup of his known COPD/asthma.  We have been trying to maintain him on a LABA/ICS, but he has not been able to tolerate because of dysphonia even with good rinsing.  He has also tried a spacer.  The patient has discontinued the medication, and feels that his breathing is at a reasonable baseline.  He denies any significant cough, mucus, or recent flareup.   Review of Systems  Constitutional: Negative for fever and unexpected weight change.  HENT: Negative for ear pain, nosebleeds, congestion, sore throat, rhinorrhea, sneezing, trouble swallowing, dental problem, postnasal drip and sinus pressure.   Eyes: Negative for redness and itching.  Respiratory: Negative for cough, chest tightness, shortness of breath and wheezing.   Cardiovascular: Positive for leg swelling. Negative for palpitations.  Gastrointestinal: Negative for nausea and vomiting.  Genitourinary: Negative for dysuria.  Musculoskeletal: Negative for joint swelling.  Skin: Positive for rash.  Neurological: Negative for headaches.  Hematological: Bruises/bleeds easily.  Psychiatric/Behavioral: Negative for dysphoric mood. The patient is not nervous/anxious.        Objective:   Physical Exam Well-developed male in no acute distress Nose without purulence or discharge noted Chest totally clear to auscultation, no wheezing Cardiac exam with regular rate and rhythm Lower extremities without significant edema, no cyanosis Alert and oriented, moves all 4 extremities.       Assessment & Plan:

## 2012-09-25 NOTE — Patient Instructions (Addendum)
Stay off maintenance pulmonary medications for now.  Can use albuterol for rescue if needed. If you are having increased symptoms, or if you are having flareups, we can treat you with nebulized medications twice a day that will not irritate your upper airway.  Please call if you are having increased breathing problems. Will give you a flu shot today. followup with me in one year.

## 2012-11-13 ENCOUNTER — Telehealth: Payer: Self-pay | Admitting: Cardiology

## 2012-11-13 NOTE — Telephone Encounter (Signed)
Pt calling in for refill of cardura, will be out be tomorrow night, uses [facility] , needs asap

## 2012-11-14 ENCOUNTER — Other Ambulatory Visit: Payer: Self-pay

## 2012-11-14 MED ORDER — DOXAZOSIN MESYLATE 2 MG PO TABS: 2.0000 mg | ORAL_TABLET | Freq: Every day | ORAL | Status: DC

## 2012-12-28 ENCOUNTER — Encounter: Payer: Self-pay | Admitting: Cardiology

## 2012-12-28 ENCOUNTER — Ambulatory Visit (INDEPENDENT_AMBULATORY_CARE_PROVIDER_SITE_OTHER): Payer: Medicare Other | Admitting: Cardiology

## 2012-12-28 VITALS — BP 158/68 | HR 70 | Ht 69.0 in | Wt 174.4 lb

## 2012-12-28 DIAGNOSIS — I10 Essential (primary) hypertension: Secondary | ICD-10-CM

## 2012-12-28 DIAGNOSIS — E785 Hyperlipidemia, unspecified: Secondary | ICD-10-CM

## 2012-12-28 DIAGNOSIS — I2510 Atherosclerotic heart disease of native coronary artery without angina pectoris: Secondary | ICD-10-CM

## 2012-12-28 NOTE — Patient Instructions (Addendum)
Continue your efforts at lifestyle modification  I will schedule you for a stress Echo.  I will see you in 6 months.

## 2012-12-28 NOTE — Progress Notes (Signed)
Patrick Rodriguez Date of Birth: 08-29-1925 Medical Record #[id number]  History of Present Illness: Patrick Rodriguez is seen back today for a follow up visit. He has a history of HLD, HTN, CAD with remote stenting of the LAD in 2000 with last stress test in 2008, obesity, ED, syncope in 2013 felt to be due to dehydration. HCTZ was stopped at that time. Since his last visit he hasn't made significant lifestyle modifications. He is exercising more. He is eating better and has significantly reduced his intake of her knee. He has been evaluated by dermatology for a rash. His losartan was discontinued. His blood pressure has continued to do well and over the last 3-4 weeks he stopped his atenolol. Blood pressure readings at home are typically in the 120 to 130 range. His rashes slowly improving.  Current Outpatient Prescriptions on File Prior to Visit  Medication Sig Dispense Refill  . amLODipine (NORVASC) 5 MG tablet Take 5 mg by mouth 2 (two) times daily.       . Arginine 500 MG CAPS Take by mouth daily.        . Ascorbic Acid (VITAMIN C) 1000 MG tablet Take 1,000 mg by mouth daily.        Marland Kitchen b complex vitamins tablet Take 1 tablet by mouth daily.        . Beta Sitosterol 40 % POWD        . doxazosin (CARDURA) 2 MG tablet Take 1 tablet (2 mg total) by mouth at bedtime.  30 tablet  5  . magnesium gluconate (MAGONATE) 500 MG tablet Take 500 mg by mouth daily.        . Nattokinase 100 MG CAPS Take by mouth daily.        Marland Kitchen omeprazole (PRILOSEC) 40 MG capsule Take 40 mg by mouth daily.      . Potassium 99 MG TABS Take by mouth daily.        . Red Yeast Rice 600 MG CAPS Take by mouth as needed.       . triamcinolone cream (KENALOG) 0.1 % Apply topically as directed.       Marland Kitchen VITAMIN A PO Take 5,000 Units by mouth daily.        . vitamin E 400 UNIT capsule Take 400 Units by mouth daily.        Marland Kitchen zinc gluconate 50 MG tablet Take 50 mg by mouth daily.        . [DISCONTINUED] losartan-hydrochlorothiazide (HYZAAR)  100-25 MG per tablet Take 1 tablet by mouth daily.  30 tablet  5    Allergies  Allergen Reactions  . Hctz (Hydrochlorothiazide)     Has had hyponatremia    Past Medical History  Diagnosis Date  . Hyperlipidemia   . Hypertension   . Coronary artery disease     s/p stenting of the LAD in 2000  . Obesity   . ED (erectile dysfunction)   . Allergic rhinitis   . Normal nuclear stress test 2008  . Syncope Jan 2013    felt to be vasovagal; had normal echo, carotids ok, 1st degree AV block with mention (but no tracing) of 2nd degree type I AV block while in [place]  . GERD (gastroesophageal reflux disease)   . Normal echocardiogram Jan 2013    EF of 55% and no wall motion abnormalities  . Carotid artery stenosis Jan 2013    50-69% on the left and 1-49% on the right  Past Surgical History  Procedure Date  . Cardiac catheterization 01/20/1999    EF 60%  . Tonsillectomy   . Appendectomy   . Doppler echocardiography 02/06/1998    EF 60%  . Cardiovascular stress test 04/19/2007    EF 74%  . Coronary stent placement 2000    LAD    History  Smoking status  . Former Smoker -- 2.0 packs/day for 28 years  . Types: Cigarettes  . Quit date: 12/06/1968  Smokeless tobacco  . Not on file    History  Alcohol Use No    Family History  Problem Relation Age of Onset  . Aneurysm Mother   . Stomach cancer Father     Review of Systems: As noted in history of present illness. All other systems were reviewed and are negative.  Physical Exam: BP 158/68  Pulse 70  Ht 5\' 9"  (1.753 m)  Wt 174 lb 6.4 oz (79.107 kg)  BMI 25.75 kg/m2 Patient is very pleasant and in no acute distress. His weight is down 6 pounds. Skin is warm and dry. Color is normal.  HEENT is unremarkable. Normocephalic/atraumatic. PERRL. Sclera are nonicteric. Neck is supple. No masses. No JVD. Lungs are clear. Cardiac exam shows a regular rate and rhythm. Abdomen is soft. Extremities are with trace edema. Gait and ROM are  intact. No gross neurologic deficits noted. He has a mild macular rash.   LABORATORY DATA: N/A for today.   ECG today demonstrates normal sinus rhythm with a rate of 69 beats per minute. It is otherwise normal.  Assessment / Plan: 1. Coronary disease with prior stenting of LAD in 2000. His last stress test was in 2008. I recommended a followup stress echo at this point.  2. Hypertension blood pressure has improved. I've encouraged his lifestyle modifications. He's going to monitor his blood pressure closely and if it goes up he will go back on atenolol.  3. Hyperlipidemia.  4. History of vasovagal syncope.

## 2013-01-09 ENCOUNTER — Other Ambulatory Visit: Payer: Self-pay | Admitting: *Deleted

## 2013-01-09 MED ORDER — AMLODIPINE BESYLATE 5 MG PO TABS: 5.0000 mg | ORAL_TABLET | Freq: Two times a day (BID) | ORAL | Status: DC

## 2013-01-12 ENCOUNTER — Other Ambulatory Visit (HOSPITAL_COMMUNITY): Payer: Medicare Other

## 2013-03-13 ENCOUNTER — Encounter: Payer: Self-pay | Admitting: Cardiology

## 2013-03-14 ENCOUNTER — Encounter: Payer: Self-pay | Admitting: Cardiology

## 2013-05-07 ENCOUNTER — Other Ambulatory Visit: Payer: Self-pay | Admitting: *Deleted

## 2013-05-07 MED ORDER — AMLODIPINE BESYLATE 5 MG PO TABS: 5.0000 mg | ORAL_TABLET | Freq: Two times a day (BID) | ORAL | Status: DC

## 2013-05-08 ENCOUNTER — Other Ambulatory Visit: Payer: Self-pay | Admitting: Cardiology

## 2013-05-09 NOTE — Telephone Encounter (Signed)
doxazosin (CARDURA) 2 MG tablet  Take 1 tablet (2 mg total) by mouth at bedtime.   30 tablet   Patient Instructions  Continue your efforts at lifestyle modification I will schedule you for a stress Echo. I will see you in 6 months. Chart Reviewed By  Charna Elizabeth, LPN  on 1/61/0960 11:51 AM

## 2013-07-05 ENCOUNTER — Encounter: Payer: Self-pay | Admitting: Cardiology

## 2013-07-05 ENCOUNTER — Ambulatory Visit (INDEPENDENT_AMBULATORY_CARE_PROVIDER_SITE_OTHER): Payer: Medicare Other | Admitting: Cardiology

## 2013-07-05 VITALS — BP 136/60 | HR 61 | Ht 69.0 in | Wt 174.8 lb

## 2013-07-05 DIAGNOSIS — I10 Essential (primary) hypertension: Secondary | ICD-10-CM

## 2013-07-05 DIAGNOSIS — Z125 Encounter for screening for malignant neoplasm of prostate: Secondary | ICD-10-CM

## 2013-07-05 DIAGNOSIS — I2510 Atherosclerotic heart disease of native coronary artery without angina pectoris: Secondary | ICD-10-CM

## 2013-07-05 DIAGNOSIS — E785 Hyperlipidemia, unspecified: Secondary | ICD-10-CM

## 2013-07-05 LAB — CBC WITH DIFFERENTIAL/PLATELET
Basophils Absolute: 0 10*3/uL (ref 0.0–0.1)
Hemoglobin: 12.1 g/dL — ABNORMAL LOW (ref 13.0–17.0)
Lymphocytes Relative: 22.2 % (ref 12.0–46.0)
Monocytes Relative: 12.4 % — ABNORMAL HIGH (ref 3.0–12.0)
Neutro Abs: 3.6 10*3/uL (ref 1.4–7.7)
RBC: 3.72 Mil/uL — ABNORMAL LOW (ref 4.22–5.81)
RDW: 16 % — ABNORMAL HIGH (ref 11.5–14.6)
WBC: 5.9 10*3/uL (ref 4.5–10.5)

## 2013-07-05 LAB — LIPID PANEL
HDL: 38.9 mg/dL — ABNORMAL LOW (ref >39.00)
LDL Cholesterol: 109 mg/dL — ABNORMAL HIGH (ref 0–99)
Total CHOL/HDL Ratio: 4
Triglycerides: 46 mg/dL (ref 0.0–149.0)

## 2013-07-05 LAB — BASIC METABOLIC PANEL
CO2: 26 mEq/L (ref 19–32)
Chloride: 103 mEq/L (ref 96–112)
Creatinine, Ser: 1.3 mg/dL (ref 0.4–1.5)

## 2013-07-05 LAB — HEPATIC FUNCTION PANEL
Albumin: 3.9 g/dL (ref 3.5–5.2)
Alkaline Phosphatase: 52 U/L (ref 39–117)

## 2013-07-05 MED ORDER — ATENOLOL 25 MG PO TABS: 25.0000 mg | ORAL_TABLET | Freq: Every day | ORAL | Status: DC

## 2013-07-05 NOTE — Patient Instructions (Signed)
We will check lab work today  Continue your current therapy  I will see you in 6 months.   

## 2013-07-05 NOTE — Progress Notes (Signed)
Patrick Rodriguez Date of Birth: February 17, 1925 Medical Record #[id number]  History of Present Illness: Patrick Rodriguez is seen back today for a follow up visit. He has a history of HLD, HTN, CAD with remote stenting of the LAD in 2000 with last stress test in 2008, obesity, ED, syncope in 2013 felt to be due to dehydration. HCTZ was stopped at that time. On his last visit we had discussed followup stress testing but he has deferred this because of cost and the fact that he is not having any symptoms. He denies any significant chest pain or shortness of breath. He has been prescribed an inhaler but doesn't use this. He is walking daily on his treadmill and feels very well. He does have some mild obstructive urinary symptoms but in general is able to void quite well.  Current Outpatient Prescriptions on File Prior to Visit  Medication Sig Dispense Refill  . amLODipine (NORVASC) 5 MG tablet Take 1 tablet (5 mg total) by mouth 2 (two) times daily.  60 tablet  6  . Arginine 500 MG CAPS Take by mouth daily.        . Ascorbic Acid (VITAMIN C) 1000 MG tablet Take 1,000 mg by mouth daily.        Marland Kitchen b complex vitamins tablet Take 1 tablet by mouth daily.        Marland Kitchen doxazosin (CARDURA) 2 MG tablet TAKE 1 TABLET BY MOUTH ONCE DAILY AT BEDTIME  90 tablet  2  . magnesium gluconate (MAGONATE) 500 MG tablet Take 500 mg by mouth daily.        . Nattokinase 100 MG CAPS Take by mouth daily.        Marland Kitchen omeprazole (PRILOSEC) 40 MG capsule Take 40 mg by mouth daily.      . Red Yeast Rice 600 MG CAPS Take by mouth as needed.       Marland Kitchen VITAMIN A PO Take 5,000 Units by mouth daily.        . vitamin E 400 UNIT capsule Take 400 Units by mouth daily.        Marland Kitchen zinc gluconate 50 MG tablet Take 50 mg by mouth daily.        . [DISCONTINUED] losartan-hydrochlorothiazide (HYZAAR) 100-25 MG per tablet Take 1 tablet by mouth daily.  30 tablet  5   No current facility-administered medications on file prior to visit.    Allergies  Allergen  Reactions  . Hctz (Hydrochlorothiazide)     Has had hyponatremia    Past Medical History  Diagnosis Date  . Hyperlipidemia   . Hypertension   . Coronary artery disease     s/p stenting of the LAD in 2000  . Obesity   . ED (erectile dysfunction)   . Allergic rhinitis   . Normal nuclear stress test 2008  . Syncope Jan 2013    felt to be vasovagal; had normal echo, carotids ok, 1st degree AV block with mention (but no tracing) of 2nd degree type I AV block while in [place]  . GERD (gastroesophageal reflux disease)   . Normal echocardiogram Jan 2013    EF of 55% and no wall motion abnormalities  . Carotid artery stenosis Jan 2013    50-69% on the left and 1-49% on the right    Past Surgical History  Procedure Laterality Date  . Cardiac catheterization  01/20/1999    EF 60%  . Tonsillectomy    . Appendectomy    . Doppler  echocardiography  02/06/1998    EF 60%  . Cardiovascular stress test  04/19/2007    EF 74%  . Coronary stent placement  2000    LAD    History  Smoking status  . Former Smoker -- 2.00 packs/day for 28 years  . Types: Cigarettes  . Quit date: 12/06/1968  Smokeless tobacco  . Not on file    History  Alcohol Use No    Family History  Problem Relation Age of Onset  . Aneurysm Mother   . Stomach cancer Father     Review of Systems: As noted in history of present illness. All other systems were reviewed and are negative.  Physical Exam: BP 136/60  Pulse 61  Ht 5\' 9"  (1.753 m)  Wt 174 lb 12.8 oz (79.289 kg)  BMI 25.8 kg/m2  SpO2 99% Patient is very pleasant and in no acute distress.  Skin is warm and dry. Color is normal.  HEENT is unremarkable. Normocephalic/atraumatic. PERRL. Sclera are nonicteric. Neck is supple. No masses. No JVD. Lungs are clear. Cardiac exam shows a regular rate and rhythm. Abdomen is soft. Extremities are with trace edema. Gait and ROM are intact. No gross neurologic deficits noted. He has a mild macular rash.   LABORATORY  DATA:     Assessment / Plan: 1. Coronary disease with prior stenting of LAD in 2000. His last stress test was in 2008. He remains asymptomatic. We'll continue medical therapy unless he develops new symptoms. We will follow a symptom guided therapy and not plan on followup stress testing.  2. Hypertension blood pressure is well controlled.  3. Hyperlipidemia.  4. History of vasovagal syncope.  We will check lab work today including CBC, chemistries, lipid panel, TSH, and PSA. Otherwise I'll followup again in 6 months.

## 2013-09-05 ENCOUNTER — Other Ambulatory Visit: Payer: Self-pay | Admitting: Pulmonary Disease

## 2013-09-26 ENCOUNTER — Ambulatory Visit (INDEPENDENT_AMBULATORY_CARE_PROVIDER_SITE_OTHER): Payer: Medicare Other | Admitting: Pulmonary Disease

## 2013-09-26 ENCOUNTER — Encounter: Payer: Self-pay | Admitting: Pulmonary Disease

## 2013-09-26 VITALS — BP 122/58 | HR 58 | Temp 97.6°F | Ht 69.0 in | Wt 180.2 lb

## 2013-09-26 DIAGNOSIS — J45909 Unspecified asthma, uncomplicated: Secondary | ICD-10-CM

## 2013-09-26 NOTE — Patient Instructions (Addendum)
If you are having more consistent increased symptoms, please call us to try a regular medication for your breathing.  If your swallowing and reflux symptoms are getting worse, you need to get with a gastroenterologist for evaluation.  Stay active as possible Don't forget about your rescue inhaler that you can use as needed. followup with me in one year if doing well.

## 2013-09-26 NOTE — Assessment & Plan Note (Signed)
He is doing fairly well from a COPD standpoint.  He enjoys fairly good exercise tolerance that is stable, and has not had a recent acute exacerbation.  I have been trying to get him to consider a maintenance regimen, but he would rather take medications as needed.  I am okay with this provider that he is not symptomatic regularly, or is having acute exacerbations.

## 2013-09-26 NOTE — Progress Notes (Signed)
  Subjective:    Patient ID: Patrick Rodriguez, male    DOB: 02/23/1925, 79 y.o.   MRN: [id number]  HPI Patient comes in today for followup of his known COPD, felt secondary to emphysema and asthma.  He has been intolerant of inhaled corticosteroids, and has not wanted to stay on any type of maintenance bronchodilator regimen.  He just uses p.r.n. Albuterol.  Since last visit, the patient feels that he has done well, and notices that his breathing does get better when he works on a more consistent exercise.  He denies any significant cough, but has been having more swallowing and reflux issues.   Review of Systems  Constitutional: Negative for fever and unexpected weight change.  HENT: Negative for congestion, dental problem, ear pain, nosebleeds, postnasal drip, rhinorrhea, sinus pressure, sneezing, sore throat and trouble swallowing.   Eyes: Negative for redness and itching.  Respiratory: Positive for cough, chest tightness, shortness of breath and wheezing.        Symptoms at times with exertion  Cardiovascular: Negative for palpitations and leg swelling.  Gastrointestinal: Negative for nausea and vomiting.  Genitourinary: Negative for dysuria.  Musculoskeletal: Negative for joint swelling.  Skin: Negative for rash.  Neurological: Negative for headaches.  Hematological: Does not bruise/bleed easily.  Psychiatric/Behavioral: Negative for dysphoric mood. The patient is not nervous/anxious.        Objective:   Physical Exam Overweight male in no acute distress Nose without purulence or discharge noted Neck without lymphadenopathy or thyromegaly Chest with mildly decreased breath sounds, no wheezes or rhonchi Cardiac exam regular rate and rhythm Lower extremities mild ankle edema, no cyanosis Alert and oriented, moves all 4 extremities.       Assessment & Plan:

## 2013-11-02 ENCOUNTER — Telehealth: Payer: Self-pay | Admitting: Cardiology

## 2013-11-02 NOTE — Telephone Encounter (Signed)
States he had an episode of hemorrhoid bleed for 4-5 days earlier in week. Did not see a doctor.  States it has happened in past.  States when he lies down he has noticed HR irreg but when stands is regular.  BP 122/64 and HR 70.  Advised that he probably is dehydrated.  Advised to drink plenty of fluid and if still notices HR irreg to call our office back the first of the week.  States he is not symptomatic but was just concerned.

## 2013-11-02 NOTE — Telephone Encounter (Signed)
New Problem  Pt called states the he is experiencing skipped heart beats when he lays down// loss of blood from Hernia// requesting to be seen on Monday morning if possible// please call

## 2013-11-05 ENCOUNTER — Telehealth: Payer: Self-pay | Admitting: Cardiology

## 2013-11-05 NOTE — Telephone Encounter (Signed)
I spoke with the pt and he has continued to have palpitations since he spoke with the triage nurse on 11/02/13. The pt states he had an internal hemorrhoid that ruptured earlier last week and he lost a significant amount of bright red blood over a few days.  On Thanksgiving the pt had a spell when he thought he was going to pass out and he had to lay down.  The pt said the skipping started at that time and has continued to come and go and is "all over the place".  The pt has been monitoring his vitals and they have been pulse 50-90 , BP 130-140/50-60, the pt has been pushing fluids since Friday.  I have scheduled the pt to come into the office tomorrow for further evaluation with Tereso Newcomer PA-C. Pt agreed with plan.

## 2013-11-05 NOTE — Telephone Encounter (Signed)
New Problem  Pt called requests a same day appointment// states that his heart has skipped beats for over a few days// Please call for same day app

## 2013-11-06 ENCOUNTER — Encounter: Payer: Self-pay | Admitting: Physician Assistant

## 2013-11-06 ENCOUNTER — Other Ambulatory Visit: Payer: Self-pay | Admitting: *Deleted

## 2013-11-06 ENCOUNTER — Ambulatory Visit (INDEPENDENT_AMBULATORY_CARE_PROVIDER_SITE_OTHER): Payer: Medicare Other | Admitting: Physician Assistant

## 2013-11-06 VITALS — BP 140/58 | HR 67 | Ht 69.0 in | Wt 178.0 lb

## 2013-11-06 DIAGNOSIS — I10 Essential (primary) hypertension: Secondary | ICD-10-CM

## 2013-11-06 DIAGNOSIS — I2510 Atherosclerotic heart disease of native coronary artery without angina pectoris: Secondary | ICD-10-CM

## 2013-11-06 DIAGNOSIS — E785 Hyperlipidemia, unspecified: Secondary | ICD-10-CM

## 2013-11-06 DIAGNOSIS — I658 Occlusion and stenosis of other precerebral arteries: Secondary | ICD-10-CM

## 2013-11-06 DIAGNOSIS — I6523 Occlusion and stenosis of bilateral carotid arteries: Secondary | ICD-10-CM

## 2013-11-06 DIAGNOSIS — R42 Dizziness and giddiness: Secondary | ICD-10-CM

## 2013-11-06 DIAGNOSIS — R0602 Shortness of breath: Secondary | ICD-10-CM

## 2013-11-06 DIAGNOSIS — I6529 Occlusion and stenosis of unspecified carotid artery: Secondary | ICD-10-CM

## 2013-11-06 DIAGNOSIS — K625 Hemorrhage of anus and rectum: Secondary | ICD-10-CM

## 2013-11-06 DIAGNOSIS — R002 Palpitations: Secondary | ICD-10-CM

## 2013-11-06 LAB — CBC WITH DIFFERENTIAL/PLATELET
Basophils Relative: 0.5 % (ref 0.0–3.0)
Eosinophils Relative: 2.6 % (ref 0.0–5.0)
HCT: 26 % — ABNORMAL LOW (ref 39.0–52.0)
Hemoglobin: 8.8 g/dL — ABNORMAL LOW (ref 13.0–17.0)
Lymphs Abs: 1.7 10*3/uL (ref 0.7–4.0)
Monocytes Relative: 9.4 % (ref 3.0–12.0)
Neutro Abs: 7.7 10*3/uL (ref 1.4–7.7)
RDW: 17.1 % — ABNORMAL HIGH (ref 11.5–14.6)
WBC: 10.6 10*3/uL — ABNORMAL HIGH (ref 4.5–10.5)

## 2013-11-06 LAB — BASIC METABOLIC PANEL
GFR: 53.66 mL/min — ABNORMAL LOW (ref >60.00)
Glucose, Bld: 142 mg/dL — ABNORMAL HIGH (ref 70–99)
Potassium: 4.5 mEq/L (ref 3.5–5.1)
Sodium: 135 mEq/L (ref 135–145)

## 2013-11-06 MED ORDER — FERROUS SULFATE 325 (65 FE) MG PO TABS: 325.0000 mg | ORAL_TABLET | Freq: Two times a day (BID) | ORAL | Status: DC

## 2013-11-06 MED ORDER — AMLODIPINE BESYLATE 5 MG PO TABS: 5.0000 mg | ORAL_TABLET | Freq: Every day | ORAL | Status: DC

## 2013-11-06 NOTE — Progress Notes (Addendum)
[address] [place], [place]  [address] Phone: [phone] Fax:  [phone]  Date:  11/06/2013   ID:  GRAVES NIPP, DOB [birth year], MRN [id number]  PCP:  Elvina Sidle, MD  Cardiologist:  Dr. Peter Swaziland     History of Present Illness: Patrick Rodriguez is a 79 y.o. male with a history of CAD, status post remote stenting of the LAD in 2000, HTN, HL, COPD. He had an episode of syncope in 2013 felt to be due to dehydration. HCTZ was stopped at that time. Stress nuclear study (5/08): Normal, no ischemia, EF 74%. Echocardiogram (12/2011): Normal ejection fraction, mild LVH, LAE, trace MR, trace TR. Carotid US (12/2011): Left 50-69%, right 1-49%. Last seen by Dr. Swaziland 06/2013.  The patient notes an episode of bright red blood per rectum last week that lasted about 3 days. This was painless. He thinks he may have a history of hemorrhoids. He felt weak with this. While he was carving a [place] on Thanksgiving day, he became near syncopal. He has noted some associated palpitations. These feel like a skipping sensation. He notes decreased energy. He also notes dyspnea with most activities now. He denies chest discomfort. He denies syncope. He denies orthopnea, PND or significant pedal edema. He did stop taking his aspirin. Of note, he had some diarrhea around the time that his hematochezia started.   Recent Labs: 07/05/2013: ALT 17; Creatinine 1.3; HDL 38.90*; Hemoglobin 12.1*; LDL (calc) 109*; Potassium 4.6; TSH 1.46   Wt Readings from Last 3 Encounters:  09/26/13 180 lb 3.2 oz (81.738 kg)  07/05/13 174 lb 12.8 oz (79.289 kg)  12/28/12 174 lb 6.4 oz (79.107 kg)     Past Medical History  Diagnosis Date  . Hyperlipidemia   . Hypertension   . Coronary artery disease     s/p stenting of the LAD in 2000  . Obesity   . ED (erectile dysfunction)   . Allergic rhinitis   . Normal nuclear stress test 2008  . Syncope Jan 2013    felt to be vasovagal; had normal echo, carotids ok, 1st  degree AV block with mention (but no tracing) of 2nd degree type I AV block while in [place]  . GERD (gastroesophageal reflux disease)   . Normal echocardiogram Jan 2013    EF of 55% and no wall motion abnormalities  . Carotid artery stenosis Jan 2013    50-69% on the left and 1-49% on the right    Current Outpatient Prescriptions  Medication Sig Dispense Refill  . albuterol (PROVENTIL HFA;VENTOLIN HFA) 108 (90 BASE) MCG/ACT inhaler Inhale 2 puffs into the lungs every 6 (six) hours as needed for wheezing or shortness of breath.      Marland Kitchen amLODipine (NORVASC) 5 MG tablet Take 1 tablet (5 mg total) by mouth 2 (two) times daily.  60 tablet  6  . Arginine 500 MG CAPS Take by mouth daily.        . Ascorbic Acid (VITAMIN C) 1000 MG tablet Take 1,000 mg by mouth daily.        Marland Kitchen atenolol (TENORMIN) 25 MG tablet Take 1 tablet (25 mg total) by mouth daily.  30 tablet  6  . b complex vitamins tablet Take 1 tablet by mouth daily.        Marland Kitchen doxazosin (CARDURA) 2 MG tablet TAKE 1 TABLET BY MOUTH ONCE DAILY AT BEDTIME  90 tablet  2  . magnesium gluconate (MAGONATE) 500 MG tablet Take 500  mg by mouth daily.        . Nattokinase 100 MG CAPS Take by mouth daily.        Marland Kitchen omeprazole (PRILOSEC) 40 MG capsule Take 40 mg by mouth daily.      . Red Yeast Rice 600 MG CAPS Take by mouth as needed.       . SYMBICORT 160-4.5 MCG/ACT inhaler INHALE 2 PUFFS INTO THE LUNGS TWICE DAILY  10.2 g  0  . VITAMIN A PO Take 5,000 Units by mouth daily.        . vitamin E 400 UNIT capsule Take 400 Units by mouth daily.        Marland Kitchen zinc gluconate 50 MG tablet Take 50 mg by mouth daily.        . [DISCONTINUED] losartan-hydrochlorothiazide (HYZAAR) 100-25 MG per tablet Take 1 tablet by mouth daily.  30 tablet  5   No current facility-administered medications for this visit.    Allergies:   Hctz   Social History:  The patient  reports that he quit smoking about 44 years ago. His smoking use included Cigarettes. He has a 56 pack-year  smoking history. He does not have any smokeless tobacco history on file. He reports that he does not drink alcohol or use illicit drugs.   Family History:  The patient's family history includes Aneurysm in his mother; Stomach cancer in his father.   ROS:  Please see the history of present illness.   He denies fever. Has a chronic cough without change. His stools were somewhat black at one point last week. They're back to normal color. He denies weight changes.   All other systems reviewed and negative.   PHYSICAL EXAM: VS:  BP 130/60  Pulse 67  Ht 5\' 9"  (1.753 m)  Wt 178 lb (80.74 kg)  BMI 26.27 kg/m2  Filed Vitals:   11/06/13 1001 11/06/13 1002 11/06/13 1003 11/06/13 1005  BP: 140/56 130/54 140/56 140/58  Pulse:      Height:      Weight:         Well nourished, well developed, in no acute distress HEENT: normal Neck: no JVD Cardiac:  distant S1, S2; RRR; no murmur Lungs:  clear to auscultation bilaterally, no wheezing, rhonchi or rales Abd: soft, nontender, no hepatomegaly Ext: no edema Skin: warm and dry Neuro:  CNs 2-12 intact, no focal abnormalities noted  EKG:  NSR, HR 67, 1st degree AVB, no ST changes.     ASSESSMENT AND PLAN:  1. Rectal Bleeding:  I suspect that he lost enough blood with this to cause a lot of the symptoms he is experiencing.  I will check a CBC, BMET today.  I will refer him to GI as soon as possible. 2. Dyspnea:  I suspect this is related to the above.  I will go ahead and check a BNP.  He does not look volume overloaded on exam.  It has also been a long time since he has had an evaluation for ischemia.  I will go ahead and schedule an ETT-myoview.  If his Hgb is significantly low, we can place this on hold and proceed with urgent GI evaluation.   3. Palpitations:  I suspect PVC/PACs in setting of recent rectal bleeding.  I will go ahead and get a 48 hour Holter.  Check BMET today.  4. CAD:  Ok to hold ASA now with recent rectal bleeding.  Continue beta  blocker.  Obtain myoview as noted.  5. Hypertension:  BP drops some with lying to standing.  Will decrease Amlodipine to 5 mg QD for now. 6. Carotid Stenosis:  He is overdue for follow up US.  Will arrange carotid US. 7. COPD:  F/u with pulmonary as planned.  8. Disposition:  F/u with me or Dr. Peter Swaziland in 2-3 weeks.   Signed, Tereso Newcomer, PA-C  11/06/2013 9:07 AM

## 2013-11-06 NOTE — Patient Instructions (Addendum)
LABS TODAY; BMET, CBC W/DIFF, BNP  YOU WILL NEED TO SCHEDULE AN EXERCISE MYOCARDIAL PERFUSION; DX 786.05, 414.01  You have been referred to [facility]; DX RECTAL BLEEDING  YOU WILL NEED TO SCHEDULE TO HAVE A CAROTID DOPPLERS ; DX CAROTID STENOSIS  YOU WILL NEED TO SCHEDULE FOR A 48 HOUR HOLTER MONITOR; DX PALPITATIONS; DIZZINESS  PLEASE FOLLOW UP WITH DR. Swaziland IN 2-3 WEEKS; IF NOT AVAILABLE THEN WITH SCOTT WEAVER, PAC SAME DAY DR. Swaziland IS IN THE OFFICE.  DECREASE NORVASC TO 5 MG DAILY

## 2013-11-07 NOTE — Addendum Note (Signed)
Addended by: Reesa Chew on: 11/07/2013 05:44 PM   Modules accepted: Orders

## 2013-11-08 ENCOUNTER — Encounter (HOSPITAL_COMMUNITY): Payer: Medicare Other

## 2013-11-08 ENCOUNTER — Other Ambulatory Visit: Payer: Self-pay

## 2013-11-08 ENCOUNTER — Ambulatory Visit (INDEPENDENT_AMBULATORY_CARE_PROVIDER_SITE_OTHER): Payer: Medicare Other | Admitting: Physician Assistant

## 2013-11-08 ENCOUNTER — Encounter (INDEPENDENT_AMBULATORY_CARE_PROVIDER_SITE_OTHER): Payer: Medicare Other

## 2013-11-08 ENCOUNTER — Encounter: Payer: Self-pay | Admitting: Physician Assistant

## 2013-11-08 ENCOUNTER — Encounter: Payer: Self-pay | Admitting: *Deleted

## 2013-11-08 ENCOUNTER — Other Ambulatory Visit (INDEPENDENT_AMBULATORY_CARE_PROVIDER_SITE_OTHER): Payer: Medicare Other

## 2013-11-08 VITALS — BP 108/50 | HR 64 | Ht 67.5 in | Wt 178.0 lb

## 2013-11-08 DIAGNOSIS — R0609 Other forms of dyspnea: Secondary | ICD-10-CM

## 2013-11-08 DIAGNOSIS — R002 Palpitations: Secondary | ICD-10-CM

## 2013-11-08 DIAGNOSIS — R42 Dizziness and giddiness: Secondary | ICD-10-CM

## 2013-11-08 DIAGNOSIS — K922 Gastrointestinal hemorrhage, unspecified: Secondary | ICD-10-CM

## 2013-11-08 DIAGNOSIS — D649 Anemia, unspecified: Secondary | ICD-10-CM

## 2013-11-08 LAB — CBC WITH DIFFERENTIAL/PLATELET
Basophils Absolute: 0.1 10*3/uL (ref 0.0–0.1)
Basophils Relative: 0.6 % (ref 0.0–3.0)
Eosinophils Absolute: 0.3 10*3/uL (ref 0.0–0.7)
Eosinophils Relative: 2.5 % (ref 0.0–5.0)
HCT: 26.3 % — ABNORMAL LOW (ref 39.0–52.0)
Hemoglobin: 8.8 g/dL — ABNORMAL LOW (ref 13.0–17.0)
Lymphs Abs: 1.8 10*3/uL (ref 0.7–4.0)
Monocytes Absolute: 1.6 10*3/uL — ABNORMAL HIGH (ref 0.1–1.0)
Monocytes Relative: 14.3 % — ABNORMAL HIGH (ref 3.0–12.0)
RBC: 2.76 Mil/uL — ABNORMAL LOW (ref 4.22–5.81)
WBC: 11.2 10*3/uL — ABNORMAL HIGH (ref 4.5–10.5)

## 2013-11-08 MED ORDER — MOVIPREP 100 G PO SOLR: 1.0000 | Freq: Once | ORAL | Status: DC

## 2013-11-08 NOTE — Progress Notes (Addendum)
Subjective:    Patient ID: Patrick Rodriguez, male    DOB: June 03, 1925, 79 y.o.   MRN: [id number]  HPI Patrick Rodriguez is an 79 year old Caucasian male new to GI today referred by Dr. Jordan/cardiology for evaluation after an acute GI bleed. Patient has history of coronary artery disease is status post stent placement in 2000 and and had been maintained on a baby aspirin. He also has history of COPD, hypertension and carotid stenosis. His last EF was measured at approximately 74%. Patient relates that he did have one prior colonoscopy done 20 or 25 years ago for screening and that was negative as far as he was aware. Patient had acute onset of bleeding about 10 days ago. He says this was painless with no associated cramping or discomfort. He had an urge for bowel movement and passed a large amount of red blood mixed in with the bowel movement. He had 2 more episodes that day and then had 2-3 bowel movements each day over the next couple of days all similarly bloody. He says he felt weak and fatigued with the bleeding. Again he had no abdominal cramping no rectal pain or discomfort. no nausea or vomiting. He did not mention to his wife that he had been bleeding. He says on Thanksgiving day he had a presyncopal episode while trying to car for [place]. He was then seen by cardiology and had labs done on 11/06/2013 which showed a hemoglobin of 8.8 hematocrit of 26 MCV of 95 and platelets of 289. Last hemoglobin checked in our system was in July of 2014 with hemoglobin of 12 hematocrit of 35. Patient stopped his aspirin on his own. He says he remains fatigued and more short of breath with exertion but has not had any chest pain. He says he has not had any bleeding in the past 5 days and that his stools are brown.   Review of Systems  Constitutional: Positive for fatigue.  HENT: Negative.   Eyes: Negative.   Respiratory: Positive for shortness of breath.   Cardiovascular: Negative.   Gastrointestinal: Positive for blood  in stool.  Endocrine: Negative.   Genitourinary: Negative.   Musculoskeletal: Negative.   Skin: Negative.   Allergic/Immunologic: Negative.   Neurological: Negative.   Hematological: Negative.   Psychiatric/Behavioral: Negative.    Outpatient Prescriptions Prior to Visit  Medication Sig Dispense Refill  . amLODipine (NORVASC) 5 MG tablet Take 1 tablet (5 mg total) by mouth daily.      . Ascorbic Acid (VITAMIN C) 1000 MG tablet Take 1,000 mg by mouth daily.        Marland Kitchen atenolol (TENORMIN) 25 MG tablet Take 1 tablet (25 mg total) by mouth daily.  30 tablet  6  . b complex vitamins tablet Take 1 tablet by mouth daily.        . budesonide-formoterol (SYMBICORT) 160-4.5 MCG/ACT inhaler INHALE 2 PUFFS INTO THE LUNGS TWICE DAILY AS NEEDED      . doxazosin (CARDURA) 2 MG tablet TAKE 1 TABLET BY MOUTH ONCE DAILY AT BEDTIME  90 tablet  2  . ferrous sulfate 325 (65 FE) MG tablet Take 1 tablet (325 mg total) by mouth 2 (two) times daily with a meal.  60 tablet  3  . magnesium gluconate (MAGONATE) 500 MG tablet Take 500 mg by mouth daily.        . Nattokinase 100 MG CAPS Take by mouth daily.        Marland Kitchen omeprazole (PRILOSEC) 40 MG capsule Take  40 mg by mouth daily.      . vitamin E 400 UNIT capsule Take 400 Units by mouth daily.        Marland Kitchen zinc gluconate 50 MG tablet Take 50 mg by mouth daily.         No facility-administered medications prior to visit.   Allergies  Allergen Reactions  . Hctz [Hydrochlorothiazide]     Has had hyponatremia   Patient Active Problem List   Diagnosis Date Noted  . Carotid stenosis 11/06/2013  . Psoriasiform dermatitis 09/20/2012  . Edema 05/01/2012  . Syncope 12/29/2011  . CAD (coronary artery disease) 10/26/2011  . Asthma exacerbation 09/06/2011  . Acute bronchitis 09/06/2011  . HYPERLIPIDEMIA 03/14/2008  . HYPERTENSION 03/14/2008  . MYOCARDIAL INFARCTION 03/14/2008  . ALLERGIC RHINITIS 03/14/2008  . COPD (chronic obstructive pulmonary disease) with emphysema  03/14/2008  . ANGINA, HX OF 03/14/2008   History  Substance Use Topics  . Smoking status: Former Smoker -- 2.00 packs/day for 28 years    Types: Cigarettes    Quit date: 12/06/1968  . Smokeless tobacco: Never Used  . Alcohol Use: No   family history includes Aneurysm in his mother; Stomach cancer in his father.     Objective:   Physical Exam  Well-developed elderly white male in no acute distress, accompanied by his wife. Pleasant blood pressure 108/50 pulse 64 height 5 foot 7 weight 178. HEENT; nontraumatic normocephalic EOMI PERRLA sclera anicteric, Supple; no JVD, Cardiovascular; regular rate and rhythm with S1-S2 there's no murmur or gallop, Pulmonary; clear bilaterally, Abdomen ;soft he is tender in the leftmid quadrant, nondistended bowel sounds are active ,he does have a diastases recti, no palpable mass or hepatosplenomegaly , Rectal ;exam not done, Extremities ;no clubbing cyanosis or edema skin warm and dry, Psych; mood and affect appropriate        Assessment & Plan:   #29  79 year old white male status post acute lower GI bleed one week ago with 3.2 g drop in his hemoglobin. This was a painless bleed and suspect diverticular etiology. He does have some mild left mid quadrant abdominal tenderness and therefore also need to rule out an occult colon lesion. Ischemic colitis is also possible though less likely as this was painless. #2 normocytic anemia acute posthemorrhagic #3 COPD #4 coronary artery disease status post MI and stent 2000 #5 hypertension #6 history of carotid stenosis #7 diverticulosis documented on prior CT 2010  Plan; repeat CBC today Patient to remain off aspirin for now until workup completed Patient and wife were advised if he has any further bleeding he should go to the emergency room for evaluation and would likely need admission and transfusions. Will schedule for colonoscopy with Dr. Alona Rodriguez was discussed in detail with patient and his wife  and they are agreeable to proceed.  Addendum: Reviewed and agree with initial management. Patrick Fiedler, MD

## 2013-11-08 NOTE — Patient Instructions (Signed)
You have been scheduled for a colonoscopy with propofol. Please follow written instructions given to you at your visit today.  Please pick up your prep kit at the pharmacy within the next 1-3 days. If you use inhalers (even only as needed), please bring them with you on the day of your procedure. Your physician has requested that you go to www.startemmi.com and enter the access code given to you at your visit today. This web site gives a general overview about your procedure. However, you should still follow specific instructions given to you by our office regarding your preparation for the procedure.  Your physician has requested that you go to the basement for the following lab work before leaving today: CBC  Please discontinue aspirin for now.  CC: Dr Milus Glazier

## 2013-11-08 NOTE — Progress Notes (Signed)
Patient ID: Patrick Rodriguez, male   DOB: Apr 11, 1925, 79 y.o.   MRN: [id number] E-Cardio 48 hour holter monitor applied to patient.

## 2013-11-09 ENCOUNTER — Other Ambulatory Visit: Payer: Self-pay | Admitting: *Deleted

## 2013-11-09 ENCOUNTER — Encounter: Payer: Self-pay | Admitting: Internal Medicine

## 2013-11-09 DIAGNOSIS — D649 Anemia, unspecified: Secondary | ICD-10-CM

## 2013-11-13 ENCOUNTER — Ambulatory Visit (HOSPITAL_COMMUNITY): Payer: Medicare Other | Attending: Cardiology

## 2013-11-13 ENCOUNTER — Ambulatory Visit: Payer: Medicare Other | Admitting: Physician Assistant

## 2013-11-13 DIAGNOSIS — I6523 Occlusion and stenosis of bilateral carotid arteries: Secondary | ICD-10-CM

## 2013-11-13 DIAGNOSIS — I6529 Occlusion and stenosis of unspecified carotid artery: Secondary | ICD-10-CM

## 2013-11-13 DIAGNOSIS — I658 Occlusion and stenosis of other precerebral arteries: Secondary | ICD-10-CM | POA: Insufficient documentation

## 2013-11-13 DIAGNOSIS — R42 Dizziness and giddiness: Secondary | ICD-10-CM

## 2013-11-15 ENCOUNTER — Ambulatory Visit (AMBULATORY_SURGERY_CENTER): Payer: Medicare Other | Admitting: Internal Medicine

## 2013-11-15 ENCOUNTER — Encounter: Payer: Self-pay | Admitting: Physician Assistant

## 2013-11-15 ENCOUNTER — Other Ambulatory Visit: Payer: Self-pay | Admitting: Internal Medicine

## 2013-11-15 VITALS — BP 148/69 | HR 70 | Temp 97.6°F | Resp 16 | Ht 67.0 in | Wt 178.0 lb

## 2013-11-15 DIAGNOSIS — D126 Benign neoplasm of colon, unspecified: Secondary | ICD-10-CM

## 2013-11-15 DIAGNOSIS — K922 Gastrointestinal hemorrhage, unspecified: Secondary | ICD-10-CM

## 2013-11-15 DIAGNOSIS — D369 Benign neoplasm, unspecified site: Secondary | ICD-10-CM

## 2013-11-15 DIAGNOSIS — K635 Polyp of colon: Secondary | ICD-10-CM

## 2013-11-15 HISTORY — DX: Polyp of colon: K63.5

## 2013-11-15 HISTORY — DX: Benign neoplasm, unspecified site: D36.9

## 2013-11-15 MED ORDER — SODIUM CHLORIDE 0.9 % IV SOLN: 500.0000 mL | INTRAVENOUS | Status: DC

## 2013-11-15 NOTE — Patient Instructions (Signed)
Findings:  Polyps, Diverticulosis Recommendations:  Wait for pathology results, High Fiber Diet, Notify your promary car doctor and /or my office with any recurrent bleeding.  YOU HAD AN ENDOSCOPIC PROCEDURE TODAY AT THE [facility]: Refer to the procedure report that was given to you for any specific questions about what was found during the examination.  If the procedure report does not answer your questions, please call your gastroenterologist to clarify.  If you requested that your care partner not be given the details of your procedure findings, then the procedure report has been included in a sealed envelope for you to review at your convenience later.  YOU SHOULD EXPECT: Some feelings of bloating in the abdomen. Passage of more gas than usual.  Walking can help get rid of the air that was put into your GI tract during the procedure and reduce the bloating. If you had a lower endoscopy (such as a colonoscopy or flexible sigmoidoscopy) you may notice spotting of blood in your stool or on the toilet paper. If you underwent a bowel prep for your procedure, then you may not have a normal bowel movement for a few days.  DIET: Your first meal following the procedure should be a light meal and then it is ok to progress to your normal diet.  A half-sandwich or bowl of soup is an example of a good first meal.  Heavy or fried foods are harder to digest and may make you feel nauseous or bloated.  Likewise meals heavy in dairy and vegetables can cause extra gas to form and this can also increase the bloating.  Drink plenty of fluids but you should avoid alcoholic beverages for 24 hours.  ACTIVITY: Your care partner should take you home directly after the procedure.  You should plan to take it easy, moving slowly for the rest of the day.  You can resume normal activity the day after the procedure however you should NOT DRIVE or use heavy machinery for 24 hours (because of the sedation medicines used  during the test).    SYMPTOMS TO REPORT IMMEDIATELY: A gastroenterologist can be reached at any hour.  During normal business hours, 8:30 AM to 5:00 PM Monday through Friday, call [phone].  After hours and on weekends, please call the GI answering service at [phone] who will take a message and have the physician on call contact you.   Following lower endoscopy (colonoscopy or flexible sigmoidoscopy):  Excessive amounts of blood in the stool  Significant tenderness or worsening of abdominal pains  Swelling of the abdomen that is new, acute  Fever of 100F or higher   FOLLOW UP: If any biopsies were taken you will be contacted by phone or by letter within the next 1-3 weeks.  Call your gastroenterologist if you have not heard about the biopsies in 3 weeks.  Our staff will call the home number listed on your records the next business day following your procedure to check on you and address any questions or concerns that you may have at that time regarding the information given to you following your procedure. This is a courtesy call and so if there is no answer at the home number and we have not heard from you through the emergency physician on call, we will assume that you have returned to your regular daily activities without incident.  SIGNATURES/CONFIDENTIALITY: You and/or your care partner have signed paperwork which will be entered into your electronic medical record.  These signatures  attest to the fact that that the information above on your After Visit Summary has been reviewed and is understood.  Full responsibility of the confidentiality of this discharge information lies with you and/or your care-partner.  Please follow all discharge instructions given to you by the recovery room nurse. If you have any questions or problems after discharge please call one of the numbers listed above. You will receive a phone call in the am to see how you are doing and answer any questions  you may have. Thank you for choosing [facility] for your health care needs.

## 2013-11-15 NOTE — Progress Notes (Signed)
Patient did not experience any of the following events: a burn prior to discharge; a fall within the facility; wrong site/side/patient/procedure/implant event; or a hospital transfer or hospital admission upon discharge from the facility. (G8907) Patient did not have preoperative order for IV antibiotic SSI prophylaxis. (G8918)  

## 2013-11-15 NOTE — Progress Notes (Signed)
Called to room to assist during endoscopic procedure.  Patient ID and intended procedure confirmed with present staff. Received instructions for my participation in the procedure from the performing physician.  

## 2013-11-15 NOTE — Progress Notes (Signed)
Lidocaine-40mg IV prior to Propofol InductionPropofol given over incremental dosages 

## 2013-11-15 NOTE — Op Note (Signed)
[facility] [address] [place], [address]   COLONOSCOPY PROCEDURE REPORT  PATIENT: Patrick Rodriguez, Patrick Rodriguez  MR#: [id number] BIRTHDATE: Jul 27, 1925 , 88  yrs. old GENDER: Male ENDOSCOPIST: Beverley Fiedler, MD REFERRED BY:Amy Esterwood, PA-C PROCEDURE DATE:  11/15/2013 PROCEDURE:   Colonoscopy with snare polypectomy First Screening Colonoscopy - Avg.  risk and is 50 yrs.  old or older - No.  Prior Negative Screening - Now for repeat screening. N/A  History of Adenoma - Now for follow-up colonoscopy & has been > or = to 3 yrs.  N/A  Polyps Removed Today? Yes. ASA CLASS:   Class III INDICATIONS:Anemia, non-specific and hematochezia. MEDICATIONS: MAC sedation, administered by CRNA and propofol (Diprivan) 200mg  IV  DESCRIPTION OF PROCEDURE:   After the risks benefits and alternatives of the procedure were thoroughly explained, informed consent was obtained.  A digital rectal exam revealed no rectal mass.   The LB [id number]-H190 [id number]  endoscope was introduced through the anus and advanced to the terminal ileum which was intubated for a short distance. No adverse events experienced.   The quality of the prep was good, using MoviPrep  The instrument was then slowly withdrawn as the colon was fully examined.    COLON FINDINGS: The mucosa appeared normal in the terminal ileum. Moderate diverticulosis was noted throughout the entire examined colon.   Six sessile polyps measuring 3-6 mm in size were found in the transverse colon (2), sigmoid colon, and rectum.  Polypectomy was performed using cold snare.  The resections were complete and the polyp tissue was partially retrieved (5 of 6 polyp were successfully retrieved).   There was no evidence of blood throughout the entire examined colon.  Retroflexed views revealed no abnormalities. The time to cecum=2 minutes 40 seconds. Withdrawal time=15 minutes 18 seconds.  The scope was withdrawn and the procedure  completed.  COMPLICATIONS: There were no complications.  ENDOSCOPIC IMPRESSION: 1.   Normal mucosa in the terminal ileum 2.   Moderate diverticulosis was noted throughout the entire examined colon 3.   Six sessile polyps measuring 3-6 mm in size were found in the transverse colon, sigmoid colon, and rectum; Polypectomy was performed using cold snare 4.   There was no evidence of blood throughout the entire examined colon  RECOMMENDATIONS: 1.  Await pathology results 2.  High fiber diet 3.  Given your age, you will not need another colonoscopy for colon cancer screening or polyp surveillance.  These types of tests usually stop around the age 39. 4.  Notify your primary care doctor and/or my office with any recurrent rectal bleeding   eSigned:  Beverley Fiedler, MD 11/15/2013 4:47 PM   cc: The Patient and Elvina Sidle, MD; Mike Gip, PA-C   PATIENT NAME:  Patrick Rodriguez, Patrick Rodriguez MR#: [id number]

## 2013-11-16 ENCOUNTER — Telehealth: Payer: Self-pay | Admitting: *Deleted

## 2013-11-16 NOTE — Telephone Encounter (Signed)
  Follow up Call-  Call back number 11/15/2013  Post procedure Call Back phone  # [phone]  Permission to leave phone message Yes     Patient questions:  Do you have a fever, pain , or abdominal swelling? no Pain Score  0 *  Have you tolerated food without any problems? yes  Have you been able to return to your normal activities? yes  Do you have any questions about your discharge instructions: Diet   no Medications  no Follow up visit  no  Do you have questions or concerns about your Care? no  Actions: * If pain score is 4 or above: No action needed, pain <4.

## 2013-11-20 ENCOUNTER — Encounter: Payer: Self-pay | Admitting: Internal Medicine

## 2013-11-21 ENCOUNTER — Encounter: Payer: Self-pay | Admitting: Physician Assistant

## 2013-11-21 ENCOUNTER — Ambulatory Visit (INDEPENDENT_AMBULATORY_CARE_PROVIDER_SITE_OTHER): Payer: Medicare Other | Admitting: Physician Assistant

## 2013-11-21 ENCOUNTER — Telehealth: Payer: Self-pay | Admitting: Physician Assistant

## 2013-11-21 VITALS — BP 150/60 | HR 64 | Ht 67.0 in | Wt 185.0 lb

## 2013-11-21 DIAGNOSIS — R0602 Shortness of breath: Secondary | ICD-10-CM

## 2013-11-21 DIAGNOSIS — I10 Essential (primary) hypertension: Secondary | ICD-10-CM

## 2013-11-21 DIAGNOSIS — I6523 Occlusion and stenosis of bilateral carotid arteries: Secondary | ICD-10-CM

## 2013-11-21 DIAGNOSIS — I6529 Occlusion and stenosis of unspecified carotid artery: Secondary | ICD-10-CM

## 2013-11-21 DIAGNOSIS — D649 Anemia, unspecified: Secondary | ICD-10-CM

## 2013-11-21 DIAGNOSIS — I658 Occlusion and stenosis of other precerebral arteries: Secondary | ICD-10-CM

## 2013-11-21 DIAGNOSIS — I2510 Atherosclerotic heart disease of native coronary artery without angina pectoris: Secondary | ICD-10-CM

## 2013-11-21 DIAGNOSIS — E785 Hyperlipidemia, unspecified: Secondary | ICD-10-CM

## 2013-11-21 LAB — CBC WITH DIFFERENTIAL/PLATELET
Basophils Relative: 1 % (ref 0.0–3.0)
Eosinophils Absolute: 0.1 10*3/uL (ref 0.0–0.7)
Eosinophils Relative: 1.7 % (ref 0.0–5.0)
Hemoglobin: 8.9 g/dL — ABNORMAL LOW (ref 13.0–17.0)
Lymphocytes Relative: 15.6 % (ref 12.0–46.0)
MCV: 95.8 fl (ref 78.0–100.0)
Monocytes Relative: 14.4 % — ABNORMAL HIGH (ref 3.0–12.0)
Neutro Abs: 5.7 10*3/uL (ref 1.4–7.7)
Neutrophils Relative %: 67.3 % (ref 43.0–77.0)
Platelets: 251 10*3/uL (ref 150.0–400.0)
RBC: 2.75 Mil/uL — ABNORMAL LOW (ref 4.22–5.81)
WBC: 8.5 10*3/uL (ref 4.5–10.5)

## 2013-11-21 MED ORDER — ATENOLOL 25 MG PO TABS: ORAL_TABLET | ORAL | Status: DC

## 2013-11-21 NOTE — Patient Instructions (Signed)
INCREASE ATENOLOL TO 25 MG IN THE MORNING AND 12.5 MG IN THE EVENING; REFILL SENT IN   LAB WORK TODAY; CBC W/DIFF  PLEASE SCHEDULE FOR A LEXISCAN MYOVIEW; DX 786.05, 414.01,   SCOTT WEAVER, PAC WILL DISCUSS WITH DR. PYRTLE TO SEE IF YOU CAN RESUME ASPIRIN  MAKE SURE TO KEEP YOUR APPT WITH DR. Swaziland 12/2013

## 2013-11-21 NOTE — Telephone Encounter (Signed)
Please let Patrick Rodriguez know that I have reviewed his case with Dr. Rhea Belton.  It is ok for him to resume ASA. Start back on ASA 81 mg QD. Tereso Newcomer, PA-C   11/21/2013 5:30 PM

## 2013-11-21 NOTE — Progress Notes (Signed)
[address] [place], [place]  [address] Phone: [phone] Fax:  [phone]  Date:  11/21/2013   ID:  Patrick Rodriguez, DOB February 22, 1925, MRN [id number]  PCP:  Elvina Sidle, MD  Cardiologist:  Dr. Peter Swaziland     History of Present Illness: Patrick Rodriguez is a 79 y.o. male with a hx of CAD, s/p remote stenting of the LAD in 2000, HTN, HL, COPD. He had an episode of syncope in 2013 felt to be due to dehydration. HCTZ was stopped at that time. Stress nuclear study (5/08): Normal, no ischemia, EF 74%. Echocardiogram (12/2011): Normal ejection fraction, mild LVH, LAE, trace MR, trace TR. Carotid US (12/2011): Left 50-69%, right 1-49%. Last seen by Dr. Swaziland 06/2013.  I saw him 12/2 with complaints of near syncope and palpitations as well as dyspnea with exertion all in the setting of recent rectal bleeding.  Hgb was noted to drop ~ 3 g (12.1 in 7/14 => 8.8 in 12/14).  He was referred to GI and underwent Colo 11/15/13: mod diverticulosis; 6 polyps s/p polypectomy.  Bx neg for malignant cells.  I put him on a 48 hour Holter.  We considered a myoview given his DOE, but canceled this when his Hgb came back low.    Holter monitor has been reviewed and this demonstrated NSR, sinus brady, freq PVCs, some blocked PACs but no significant arrhythmia.  He is here with his wife today. He continues to feel weak. He also continues to note significant dyspnea with exertion. He denies chest pain. He denies syncope. He denies orthopnea or PND. He has chronic pedal edema.  Recent Labs: 07/05/2013: ALT 17; HDL 38.90*; LDL (calc) 109*; TSH 1.46  11/06/2013: Creatinine 1.3; Potassium 4.5; Pro B Natriuretic peptide (BNP) 262.0*  11/08/2013: Hemoglobin 8.8 Repeated and verified X2.*   Wt Readings from Last 3 Encounters:  11/21/13 185 lb (83.915 kg)  11/15/13 178 lb (80.74 kg)  11/08/13 178 lb (80.74 kg)     Past Medical History  Diagnosis Date  . Hyperlipidemia   . Hypertension   . Coronary artery  disease     s/p stenting of the LAD in 2000  . Obesity   . ED (erectile dysfunction)   . Allergic rhinitis   . Normal nuclear stress test 2008  . Syncope Jan 2013    felt to be vasovagal; had normal echo, carotids ok, 1st degree AV block with mention (but no tracing) of 2nd degree type I AV block while in [place]  . GERD (gastroesophageal reflux disease)   . Normal echocardiogram Jan 2013    EF of 55% and no wall motion abnormalities  . Carotid artery stenosis     12/2011:  50-69% on the left and 1-49% on the right;  Carotid US (11/2013): Bilateral 40-59%; repeat 1 year  . Palpitations     Holter (11/2013): NSR, sinus brady, PVCs, rare blocked PAC, no sig arrhythmia    Current Outpatient Prescriptions  Medication Sig Dispense Refill  . amLODipine (NORVASC) 5 MG tablet Take 1 tablet (5 mg total) by mouth daily.      . Ascorbic Acid (VITAMIN C) 1000 MG tablet Take 1,000 mg by mouth daily.        Marland Kitchen atenolol (TENORMIN) 25 MG tablet Take 1 tablet (25 mg total) by mouth daily.  30 tablet  6  . b complex vitamins tablet Take 1 tablet by mouth daily.        . budesonide-formoterol (SYMBICORT)  160-4.5 MCG/ACT inhaler INHALE 2 PUFFS INTO THE LUNGS TWICE DAILY AS NEEDED      . doxazosin (CARDURA) 2 MG tablet TAKE 1 TABLET BY MOUTH ONCE DAILY AT BEDTIME  90 tablet  2  . ferrous sulfate 325 (65 FE) MG tablet Take 1 tablet (325 mg total) by mouth 2 (two) times daily with a meal.  60 tablet  3  . magnesium gluconate (MAGONATE) 500 MG tablet Take 500 mg by mouth daily.        . Nattokinase 100 MG CAPS Take by mouth daily.        Marland Kitchen omeprazole (PRILOSEC) 40 MG capsule Take 40 mg by mouth daily.      . vitamin E 400 UNIT capsule Take 400 Units by mouth daily.        Marland Kitchen zinc gluconate 50 MG tablet Take 50 mg by mouth daily.        . [DISCONTINUED] losartan-hydrochlorothiazide (HYZAAR) 100-25 MG per tablet Take 1 tablet by mouth daily.  30 tablet  5   No current facility-administered medications for this visit.     Allergies:   Hctz   Social History:  The patient  reports that he quit smoking about 44 years ago. His smoking use included Cigarettes. He has a 56 pack-year smoking history. He has never used smokeless tobacco. He reports that he does not drink alcohol or use illicit drugs.   Family History:  The patient's family history includes Aneurysm in his mother; Stomach cancer in his father.   ROS:  Please see the history of present illness.   He denies further bleeding.  All other systems reviewed and negative.   PHYSICAL EXAM: VS:  BP 150/60  Pulse 64  Ht 5\' 7"  (1.702 m)  Wt 185 lb (83.915 kg)  BMI 28.97 kg/m2  Well nourished, well developed, in no acute distress HEENT: normal Neck: no JVD Cardiac:  distant S1, S2; RRR; no murmur Lungs:  clear to auscultation bilaterally, no wheezing, rhonchi or rales Abd: soft, nontender, no hepatomegaly Ext: trace to 1+ bilateral LE edema Skin: warm and dry Neuro:  CNs 2-12 intact, no focal abnormalities noted  EKG:   NSR, HR 64, no ST changes, inferior Q waves     ASSESSMENT AND PLAN:  1. Rectal Bleeding:  It sounds like this was a diverticular bleed. No malignant cells on bx of his polyps.  It sounds like no further GI workup planned.  Continue on iron. Check a followup CBC today. 2. Dyspnea:  I suspect that his dyspnea is all related to his anemia. However, I cannot be certain that he does not have ischemia contributing. It has been several years since his last stress test. I will have him go ahead and do a [employer]. 3. Palpitations:  Recent Holter with PVCs that appear to be symptomatic.  Will try to adjust his beta blocker to Atenolol 25 in A and 12.5 in P to see if this helps.  4. CAD:  I will review with GI to see if he can resume ASA.  Plan myoview as noted.   5. Hypertension:  He takes Red Yeast Rice.  6. Carotid Stenosis:  Stable by recent US.  Repeat in 11/2014.   7. COPD:  F/u with pulmonary as planned.  8. Disposition:  F/u  with Dr. Peter Swaziland in 12/2013 as planned.    Signed, Tereso Newcomer, PA-C  11/21/2013 11:43 AM

## 2013-11-22 NOTE — Telephone Encounter (Signed)
Notified patient of advisement from [place], [place], to restart ASA 81 mg by mouth daily.  Also reviewed yesterday's CBC results with patient. Patient verbalized understanding and agreement with current treatment plan.

## 2013-11-22 NOTE — Progress Notes (Signed)
Quick Note:  Notified patient of advisement from [place], [place], to restart ASA 81 mg by mouth daily. Also reviewed yesterday's CBC results with patient. Patient verbalized understanding and agreement with current treatment plan. ______

## 2013-11-23 ENCOUNTER — Telehealth: Payer: Self-pay

## 2013-11-23 NOTE — Telephone Encounter (Signed)
Patient called Dr.Jordan reviewed 48 hr monitor which revealed normal sinus rhythm,sinus brady,pvc's,no significant arrhythmia.Advised to keep appointment with Dr.Jordan 01/03/14.

## 2013-12-03 ENCOUNTER — Ambulatory Visit (INDEPENDENT_AMBULATORY_CARE_PROVIDER_SITE_OTHER): Payer: Medicare Other | Admitting: Internal Medicine

## 2013-12-03 ENCOUNTER — Ambulatory Visit (INDEPENDENT_AMBULATORY_CARE_PROVIDER_SITE_OTHER)
Admission: RE | Admit: 2013-12-03 | Discharge: 2013-12-03 | Disposition: A | Discharge status: Home / Self Care | Payer: Medicare Other | Source: Ambulatory Visit | Attending: Internal Medicine | Admitting: Internal Medicine

## 2013-12-03 ENCOUNTER — Telehealth: Payer: Self-pay | Admitting: Pulmonary Disease

## 2013-12-03 ENCOUNTER — Other Ambulatory Visit: Payer: Self-pay | Admitting: Internal Medicine

## 2013-12-03 ENCOUNTER — Encounter: Payer: Self-pay | Admitting: Internal Medicine

## 2013-12-03 VITALS — BP 154/72 | HR 62 | Temp 97.9°F | Ht 69.0 in | Wt 190.0 lb

## 2013-12-03 DIAGNOSIS — D62 Acute posthemorrhagic anemia: Secondary | ICD-10-CM

## 2013-12-03 DIAGNOSIS — R059 Cough, unspecified: Secondary | ICD-10-CM

## 2013-12-03 DIAGNOSIS — J439 Emphysema, unspecified: Secondary | ICD-10-CM

## 2013-12-03 DIAGNOSIS — J438 Other emphysema: Secondary | ICD-10-CM

## 2013-12-03 DIAGNOSIS — R05 Cough: Secondary | ICD-10-CM

## 2013-12-03 MED ORDER — FUROSEMIDE 40 MG PO TABS: 40.0000 mg | ORAL_TABLET | Freq: Every day | ORAL | Status: DC

## 2013-12-03 MED ORDER — MOMETASONE FURO-FORMOTEROL FUM 100-5 MCG/ACT IN AERO: INHALATION_SPRAY | RESPIRATORY_TRACT | Status: DC

## 2013-12-03 NOTE — Progress Notes (Signed)
Subjective:     Patient ID: Patrick Rodriguez, male   DOB: February 15, 1925, 79 y.o.   MRN: [id number]  HPI  66 yowm quit smoking 1970 with documented GOLD II copd on prn symbiocrt 160 2bid due to severe hoarseness  Abruptly more sob with exertion since acute GIB x one month prior to OV assoc with fatigue  And more cough x one - two weeks assoc with noct wheeze so started symbicort 160 2 bid but more hoarse since. Sob with more than slow adls, some better p restarted symbicort, some better with saba.  No obvious day to day or daytime variabilty or assoc excess/ purulent sputum or cp or chest tightness, subjective wheeze overt sinus or hb symptoms. No unusual exp hx or h/o childhood pna/ asthma or knowledge of premature birth.   Also denies any obvious fluctuation of symptoms with weather or environmental changes or other aggravating or alleviating factors except as outlined above   Current Medications, Allergies, Complete Past Medical History, Past Surgical History, Family History, and Social History were reviewed in [employer] record.  ROS  The following are not active complaints unless bolded sore throat, dysphagia, dental problems, itching, sneezing,  nasal congestion or excess/ purulent secretions, ear ache,   fever, chills, sweats, unintended wt loss, pleuritic or exertional cp, hemoptysis,  orthopnea pnd or leg swelling, presyncope, palpitations, heartburn, abdominal pain, anorexia, nausea, vomiting, diarrhea  or change in bowel or urinary habits, change in stools or urine, dysuria,hematuria,  rash, arthralgias, visual complaints, headache, numbness weakness or ataxia or problems with walking or coordination,  change in mood/affect or memory.           Review of Systems     Objective:   Physical Exam    hoarse amb wm nad  Wt Readings from Last 3 Encounters:  12/03/13 190 lb (86.183 kg)  11/21/13 185 lb (83.915 kg)  11/15/13 178 lb (80.74 kg)     HEENT mild turbinate  edema.  Oropharynx no thrush or excess pnd or cobblestoning.  No JVD or cervical adenopathy. Mild accessory muscle hypertrophy. Trachea midline, nl thryroid. Chest was hyperinflated by percussion with diminished breath sounds and moderate increased exp time without wheeze. Hoover sign positive at mid inspiration. Regular rate and rhythm without murmur gallop or rub or increase P2 or edema.  Abd: no hsm, nl excursion. Ext warm without cyanosis or clubbing.     CXR  12/03/2013 :  Minimal bilateral pleural effusions. Mild bilateral basilar opacities are noted concerning for edema.    Assessment:

## 2013-12-03 NOTE — Patient Instructions (Addendum)
Increase iron to three times a day   Add pepcid 20 mg ac at bedtime as long as having night time symptoms (available over the counter)  Stop symbicort   Start dulera 100 Take 2 puffs first thing in am and then another 2 puffs about 12 hours later.    Work on Musician technique:  relax and gently blow all the way out then take a nice smooth deep breath back in, triggering the inhaler at same time you start breathing in.  Hold for up to 5 seconds if you can.  Rinse and gargle with water when done   If your mouth or throat starts to bother you,   I suggest you time the inhaler to your dental care and after using the inhaler(s) brush teeth and tongue with a baking soda containing toothpaste and when you rinse this out, gargle with it first to see if this helps your mouth and throat.     GERD (REFLUX)  is an extremely common cause of respiratory symptoms, many times with no significant heartburn at all.    It can be treated with medication, but also with lifestyle changes including avoidance of late meals, excessive alcohol, smoking cessation, and avoid fatty foods, chocolate, peppermint, colas, red wine, and acidic juices such as orange juice.  NO MINT OR MENTHOL PRODUCTS SO NO COUGH DROPS  USE SUGARLESS CANDY INSTEAD (jolley ranchers or Stover's)  NO OIL BASED VITAMINS - use powdered substitutes.  Please remember to go to the  x-ray department downstairs for your tests - we will call you with the results when they are available.  Late add: cxr looks wet > lasix 40 mg daily until ov with cards

## 2013-12-03 NOTE — Telephone Encounter (Signed)
[name] ° °

## 2013-12-03 NOTE — Telephone Encounter (Signed)
I called and spoke with pt. He is scheduled to come in and see MW this afternoon at 4 PM. Nothing further needed

## 2013-12-03 NOTE — Progress Notes (Signed)
Quick Note:  Spoke with pt and notified of results per Dr. Wert. Pt verbalized understanding and denied any questions.  ______ 

## 2013-12-04 DIAGNOSIS — D62 Acute posthemorrhagic anemia: Secondary | ICD-10-CM | POA: Insufficient documentation

## 2013-12-04 NOTE — Assessment & Plan Note (Signed)
Lab Results  Component Value Date   HGB 8.9* 11/21/2013    Trending back up slowly s active GIB, try increase fes04 to tid.

## 2013-12-04 NOTE — Assessment & Plan Note (Addendum)
Patrick Rodriguez 2007:  FEV1 1.72 (64%), ratio 52  C/w GOLD II criteria and intol of symbicort 160 but did seem to help.  DDX of  difficult airways managment all start with A and  include Adherence, Ace Inhibitors, Acid Reflux, Active Sinus Disease, Alpha 1 Antitripsin deficiency, Anxiety masquerading as Airways dz,  ABPA,  allergy(esp in young), Aspiration (esp in elderly), Adverse effects of DPI,  Active smokers, plus two Bs  = Bronchiectasis and Beta blocker use..and one C= CHF  Adherence is always the initial "prime suspect" and is a multilayered concern that requires a "trust but verify" approach in every patient - starting with knowing how to use medications, especially inhalers, correctly, keeping up with refills and understanding the fundamental difference between maintenance and prns vs those medications only taken for a very short course and then stopped and not refilled. The proper method of use, as well as anticipated side effects, of a metered-dose inhaler are discussed and demonstrated to the patient. Improved effectiveness after extensive coaching during this visit to a level of approximately  75%  - not problem with thrush with high dose ICS rec dulera 100 2 q 12h prn   ? Acid (or non-acid) GERD > always difficult to exclude as up to 75% of pts in some series report no assoc GI/ Heartburn symptoms> rec max (24h)  acid suppression and diet restrictions/ reviewed and instructions given in writing.    CHF ? Related to anemia > add lasix 40 mg daily pending f/u with cards already planned

## 2013-12-05 ENCOUNTER — Telehealth: Payer: Self-pay | Admitting: Cardiology

## 2013-12-05 NOTE — Telephone Encounter (Signed)
Returned call to patient he stated he didn't feel like he can walk on treadmill next week.Stated he is still weak and sob.Advised he is for a lexiscan 12/11/13 and he will not have to walk.Advised to keep appointment for lexiscan myoview 12/11/13 at 9:30 am.

## 2013-12-05 NOTE — Telephone Encounter (Signed)
New message    Pt wants to know if he is ready for a stress test b/c he is still having breathing prob and weakness.

## 2013-12-11 ENCOUNTER — Ambulatory Visit (HOSPITAL_COMMUNITY): Payer: Medicare Other | Attending: Cardiology | Admitting: Radiology

## 2013-12-11 ENCOUNTER — Encounter: Payer: Self-pay | Admitting: Cardiology

## 2013-12-11 VITALS — BP 154/71 | Ht 69.0 in | Wt 173.0 lb

## 2013-12-11 DIAGNOSIS — I2510 Atherosclerotic heart disease of native coronary artery without angina pectoris: Secondary | ICD-10-CM | POA: Insufficient documentation

## 2013-12-11 DIAGNOSIS — R0602 Shortness of breath: Secondary | ICD-10-CM | POA: Insufficient documentation

## 2013-12-11 MED ORDER — TECHNETIUM TC 99M SESTAMIBI GENERIC - CARDIOLITE
30.0000 | Freq: Once | INTRAVENOUS | Status: AC | PRN
  Administered 2013-12-11: 30 via INTRAVENOUS

## 2013-12-11 MED ORDER — TECHNETIUM TC 99M SESTAMIBI GENERIC - CARDIOLITE
10.0000 | Freq: Once | INTRAVENOUS | Status: AC | PRN
  Administered 2013-12-11: 10 via INTRAVENOUS

## 2013-12-11 MED ORDER — REGADENOSON 0.4 MG/5ML IV SOLN
0.4000 mg | Freq: Once | INTRAVENOUS | Status: AC
  Administered 2013-12-11: 0.4 mg via INTRAVENOUS

## 2013-12-11 NOTE — Progress Notes (Signed)
[facility] [address] [place], [place] [address] [phone]    Cardiology Nuclear Med Study  Patrick Rodriguez is a 80 y.o. male     MRN : [id number]     DOB: 1925-12-06  Procedure Date: 12/11/2013  Nuclear Med Background Indication for Stress Test:  Evaluation for Ischemia and Stent Patency History:  Asthma, COPD and CAD, MI-Stent-LAD, MPI:2008 NL EF: 74%, 2013 ECHO: EF: 55% Cardiac Risk Factors: Carotid Disease, History of Smoking, Hypertension and Lipids  Symptoms:  DOE, Near Syncope, Palpitations and SOB   Nuclear Pre-Procedure Caffeine/Decaff Intake:  None NPO After: 7:00pm   Lungs:  clear O2 Sat: 98% on room air. IV 0.9% NS with Angio Cath:  22g  IV Site: R Wrist  IV Started by:  Matilde Haymaker, RN  Chest Size (in):  42 Cup Size: n/a  Height: 5\' 9"  (1.753 m)  Weight:  173 lb (78.472 kg)  BMI:  Body mass index is 25.54 kg/(m^2). Tech Comments:  Tenormin taken at 0800    Nuclear Med Study 1 or 2 day study: 1 day  Stress Test Type:  Carlton Adam  Reading MD: n/a  Order Authorizing Provider:  Geanie Cooley and Lutricia Horsfall  Resting Radionuclide: Technetium 53m Sestamibi  Resting Radionuclide Dose: 11.0 mCi   Stress Radionuclide:  Technetium 20m Sestamibi  Stress Radionuclide Dose: 33.0 mCi           Stress Protocol Rest HR: 60 Stress HR: 73  Rest BP: 154/71 Stress BP: 153/65  Exercise Time (min): n/a METS: n/a   Predicted Max HR: 134 bpm % Max HR: 54.48 bpm Rate Pressure Product: 11242   Dose of Adenosine (mg):  n/a Dose of Lexiscan: 0.4 mg  Dose of Atropine (mg): n/a Dose of Dobutamine: n/a mcg/kg/min (at max HR)  Stress Test Technologist: Perrin Maltese, EMT-P  Nuclear Technologist:  Charlton Amor, CNMT     Rest Procedure:  Myocardial perfusion imaging was performed at rest 45 minutes following the intravenous administration of Technetium 14m Sestamibi. Rest ECG: Normal resting EKG  Stress Procedure:  The patient  received IV Lexiscan 0.4 mg over 15-seconds.  Technetium 67m Sestamibi injected at 30-seconds. This patient had chest pressure, felt fuzzy, and had a headache with the Lexiscan injection.Quantitative spect images were obtained after a 45 minute delay. Stress ECG: No significant change from baseline ECG  QPS Raw Data Images:  Normal; no motion artifact; normal heart/lung ratio. Stress Images:  Normal homogeneous uptake in all areas of the myocardium. Rest Images:  Normal homogeneous uptake in all areas of the myocardium. Subtraction (SDS):  No evidence of ischemia. Transient Ischemic Dilatation (Normal <1.22):  0.93 Lung/Heart Ratio (Normal <0.45):  0.38  Quantitative Gated Spect Images QGS EDV:  107 ml QGS ESV:  32 ml  Impression Exercise Capacity:  Lexiscan with no exercise. BP Response:  Normal blood pressure response. Clinical Symptoms:  Chest pressure ECG Impression:  No significant ST segment change suggestive of ischemia. Comparison with Prior Nuclear Study:   I have compared this study with the report of the study from May, 2008.  Overall Impression:  Normal stress nuclear study. This is a low-risk scan. There is no scar or ischemia. There is no change in the study from the report of the study of 2008. The patient did have PVCs that were scattered throughout the study.  LV Ejection Fraction: 70%.  LV Wall Motion:  Normal Wall Motion.  Dola Argyle, MD

## 2013-12-12 ENCOUNTER — Encounter: Payer: Self-pay | Admitting: Physician Assistant

## 2013-12-14 ENCOUNTER — Ambulatory Visit (INDEPENDENT_AMBULATORY_CARE_PROVIDER_SITE_OTHER): Payer: Medicare Other | Admitting: Physician Assistant

## 2013-12-14 ENCOUNTER — Encounter: Payer: Self-pay | Admitting: Physician Assistant

## 2013-12-14 VITALS — BP 178/61 | HR 62 | Ht 69.0 in | Wt 175.2 lb

## 2013-12-14 DIAGNOSIS — R0602 Shortness of breath: Secondary | ICD-10-CM

## 2013-12-14 DIAGNOSIS — I5030 Unspecified diastolic (congestive) heart failure: Secondary | ICD-10-CM

## 2013-12-14 DIAGNOSIS — I2510 Atherosclerotic heart disease of native coronary artery without angina pectoris: Secondary | ICD-10-CM

## 2013-12-14 DIAGNOSIS — E785 Hyperlipidemia, unspecified: Secondary | ICD-10-CM

## 2013-12-14 DIAGNOSIS — I10 Essential (primary) hypertension: Secondary | ICD-10-CM

## 2013-12-14 DIAGNOSIS — D649 Anemia, unspecified: Secondary | ICD-10-CM | POA: Insufficient documentation

## 2013-12-14 DIAGNOSIS — I509 Heart failure, unspecified: Secondary | ICD-10-CM

## 2013-12-14 LAB — BASIC METABOLIC PANEL
BUN: 17 mg/dL (ref 6–23)
CALCIUM: 9.1 mg/dL (ref 8.4–10.5)
CO2: 27 mEq/L (ref 19–32)
Chloride: 102 mEq/L (ref 96–112)
Creatinine, Ser: 1.3 mg/dL (ref 0.4–1.5)
GFR: 57.59 mL/min — ABNORMAL LOW (ref >60.00)
Glucose, Bld: 99 mg/dL (ref 70–99)
Potassium: 3.8 mEq/L (ref 3.5–5.1)
SODIUM: 135 meq/L (ref 135–145)

## 2013-12-14 LAB — BRAIN NATRIURETIC PEPTIDE: Pro B Natriuretic peptide (BNP): 285 pg/mL — ABNORMAL HIGH (ref 0.0–100.0)

## 2013-12-14 MED ORDER — ATENOLOL 25 MG PO TABS: 25.0000 mg | ORAL_TABLET | Freq: Every day | ORAL | Status: DC

## 2013-12-14 MED ORDER — AMLODIPINE BESYLATE 10 MG PO TABS: 10.0000 mg | ORAL_TABLET | Freq: Every day | ORAL | Status: DC

## 2013-12-14 NOTE — Patient Instructions (Signed)
START TAKING ATENOLOL AT BEDTIME  STOP LASIX  INCREASE NORVASC TO 10 MG DAILY; NEW RX WAS SENT IN FOR THE 10 MG TABLET  MAKE SURE TO WEIGH DAILY IF YOUR WT IS UP BY 3 LB'S X 1 DAY OR 5 LB'S IN 1 WEEK THEN OK TO TAKE LASIX  ONE TIME  AND CALL THE OFFICE TO LET us KNOW THAT YOU HAVE TAKEN THE LASIX  KEEP YOUR APPT 01/03/14 WITH DR. Martinique;   PER SCOTT WEAVER, PAC ONLY RESCHEDULE THIS APPT IF YOU HAVE NOT TAKEN ANY LASIX AND ARE FEELING WELL, IF YOU DO CANCEL AND RESCHEDULE YOU WILL NEED TO RESCHEDULE FOR 3 MONTHS WITH  DR. Martinique

## 2013-12-14 NOTE — Progress Notes (Signed)
[place], [place], [place]  [address] Phone: [phone] Fax:  [phone]  Date:  12/14/2013   ID:  Patrick Rodriguez, DOB 03/25/1925, MRN [id number]  PCP:  Robyn Haber, MD  Cardiologist:  Dr. Peter Martinique     History of Present Illness: Patrick Rodriguez is a 80 y.o. male with a hx of CAD, s/p remote stenting of the LAD in 2000, HTN, HL, COPD. He had an episode of syncope in 2013 felt to be due to dehydration. HCTZ was stopped at that time. Stress nuclear study (5/08): Normal. Echocardiogram (12/2011): Normal EF, mild LVH, LAE, trace MR, trace TR. Carotid US (11/2013):  bilat 40-59%.  I saw him 12/2 with complaints of near syncope and palpitations as well as dyspnea with exertion all in the setting of recent rectal bleeding.  Hgb was noted to drop ~ 3 g (12.1 in 7/14 => 8.8 in 12/14).  He was referred to GI and underwent Colo 11/15/13: mod diverticulosis; 6 polyps s/p polypectomy.  Bx neg for malignant cells.  Holter monitor was done:  NSR, sinus brady, freq PVCs, some blocked PACs but no significant arrhythmia.  I saw him in followup 11/21/2013. He continued to complain of dyspnea. He did not appear volume overloaded at that time. I had him undergo a Lexiscan Myoview (12/2013): No ischemia, EF 70%.  He was subsequent seen by Dr. Melvyn Novas with pulmonology. Chest x-ray demonstrated evidence of CHF and he was placed on Lasix. He was brought back today for follow up.  He is feeling much better. His breathing is back to baseline. He is back to exercising and denies significant dyspnea.  He denies chest pain, syncope, orthopnea, PND, edema. He is concerned about not being able to get his heart rate up on the treadmill secondary to atenolol.  Recent Labs: 07/05/2013: ALT 17; HDL 38.90*; LDL (calc) 109*; TSH 1.46  11/06/2013: Creatinine 1.3; Potassium 4.5; Pro B Natriuretic peptide (BNP) 262.0*  11/21/2013: Hemoglobin 8.9*  Chest x-ray (12/29/214): IMPRESSION: Minimal bilateral pleural  effusions. Mild bilateral basilar opacities are noted concerning for edema.   Wt Readings from Last 3 Encounters:  12/14/13 175 lb 3.2 oz (79.47 kg)  12/11/13 173 lb (78.472 kg)  12/03/13 190 lb (86.183 kg)     Past Medical History  Diagnosis Date  . Hyperlipidemia   . Hypertension   . Coronary artery disease     a.  s/p stenting of the LAD in 2000;  b. Lexiscan Myoview (12/2013): No ischemia, EF 70%; normal study  . Obesity   . ED (erectile dysfunction)   . Allergic rhinitis   . Normal nuclear stress test 2008  . Syncope Jan 2013    felt to be vasovagal; had normal echo, carotids ok, 1st degree AV block with mention (but no tracing) of 2nd degree type I AV block while in [place]  . GERD (gastroesophageal reflux disease)   . Normal echocardiogram Jan 2013    EF of 55% and no wall motion abnormalities  . Carotid artery stenosis     12/2011:  50-69% on the left and 1-49% on the right;  Carotid US (11/2013): Bilateral 40-59%; repeat 1 year  . Palpitations     Holter (11/2013): NSR, sinus brady, PVCs, rare blocked PAC, no sig arrhythmia    Current Outpatient Prescriptions  Medication Sig Dispense Refill  . amLODipine (NORVASC) 5 MG tablet Take 1 tablet (5 mg total) by mouth daily.      . Ascorbic Acid (  VITAMIN C) 1000 MG tablet Take 1,000 mg by mouth daily.        Marland Kitchen atenolol (TENORMIN) 25 MG tablet Take 25 mg by mouth daily.      Marland Kitchen b complex vitamins tablet Take 1 tablet by mouth daily.        Marland Kitchen doxazosin (CARDURA) 2 MG tablet TAKE 1 TABLET BY MOUTH ONCE DAILY AT BEDTIME  90 tablet  2  . ferrous sulfate 325 (65 FE) MG tablet Take 1 tablet (325 mg total) by mouth 2 (two) times daily with a meal.  60 tablet  3  . furosemide (LASIX) 40 MG tablet Take 1 tablet (40 mg total) by mouth daily.  30 tablet  0  . magnesium gluconate (MAGONATE) 500 MG tablet Take 500 mg by mouth daily.        . mometasone-formoterol (DULERA) 100-5 MCG/ACT AERO Inhale 2 puffs into the lungs as needed.      .  Nattokinase 100 MG CAPS Take by mouth daily.        Marland Kitchen omeprazole (PRILOSEC) 40 MG capsule Take 40 mg by mouth daily.      . vitamin E 400 UNIT capsule Take 400 Units by mouth daily.        Marland Kitchen zinc gluconate 50 MG tablet Take 50 mg by mouth daily.        . [DISCONTINUED] losartan-hydrochlorothiazide (HYZAAR) 100-25 MG per tablet Take 1 tablet by mouth daily.  30 tablet  5   No current facility-administered medications for this visit.    Allergies:   Hctz   Social History:  The patient  reports that he quit smoking about 45 years ago. His smoking use included Cigarettes. He has a 56 pack-year smoking history. He has never used smokeless tobacco. He reports that he does not drink alcohol or use illicit drugs.   Family History:  The patient's family history includes Aneurysm in his mother; Stomach cancer in his father.   ROS:  Please see the history of present illness.     All other systems reviewed and negative.   PHYSICAL EXAM: VS:  BP 178/61  Pulse 62  Ht 5\' 9"  (1.753 m)  Wt 175 lb 3.2 oz (79.47 kg)  BMI 25.86 kg/m2  Well nourished, well developed, in no acute distress HEENT: normal Neck: no JVD Cardiac:  distant S1, S2; RRR; no murmur Lungs:  clear to auscultation bilaterally, no wheezing, rhonchi or rales Abd: soft, nontender, no hepatomegaly Ext: no LE edema Skin: warm and dry Neuro:  CNs 2-12 intact, no focal abnormalities noted  EKG:   NSR, HR 61, no significant changes    ASSESSMENT AND PLAN:  1. Heart Failure with Preserved Ejection Fraction (HFpEF):  I suspect this was all related to volume excess in the setting of anemia. He is improved. His weight is down. He would like to stop taking Lasix. He can discontinue Lasix. I have asked him to weigh himself daily. If his weight should go up 3 pounds in one day or 5 pounds in one week, he should resume Lasix. Check a basic metabolic panel and BNP today. 2. Anemia:  In the setting of lower GI bleed.  Continue iron for at least 3  months. Follow up with primary care. 3. CAD:  No angina. Recent Myoview low risk.  Continue aspirin. 4. Hypertension:  Blood pressure is now elevated. Increase Norvasc back to 5 mg twice daily.  He can change his atenolol to QHS.  5. Hyperlipidemia:  He takes [employer].  6. Carotid Stenosis:  Stable by recent US.  Repeat in 11/2014.   7. COPD:  F/u with pulmonary as planned.  8. Disposition:  F/u with Dr. Peter Martinique later this month as planned.  If he is stable and has not taken lasix at that time, he can reschedule this for 3 mos.  Signed, Richardson Dopp, PA-C  12/14/2013 9:20 AM

## 2013-12-17 ENCOUNTER — Ambulatory Visit: Payer: Medicare Other | Admitting: Physician Assistant

## 2014-01-03 ENCOUNTER — Ambulatory Visit (INDEPENDENT_AMBULATORY_CARE_PROVIDER_SITE_OTHER): Payer: Medicare Other | Admitting: Cardiology

## 2014-01-03 ENCOUNTER — Encounter: Payer: Self-pay | Admitting: Cardiology

## 2014-01-03 ENCOUNTER — Other Ambulatory Visit: Payer: Self-pay

## 2014-01-03 VITALS — BP 130/56 | HR 63 | Ht 69.0 in | Wt 173.8 lb

## 2014-01-03 DIAGNOSIS — I509 Heart failure, unspecified: Secondary | ICD-10-CM

## 2014-01-03 DIAGNOSIS — I2510 Atherosclerotic heart disease of native coronary artery without angina pectoris: Secondary | ICD-10-CM

## 2014-01-03 DIAGNOSIS — I10 Essential (primary) hypertension: Secondary | ICD-10-CM

## 2014-01-03 DIAGNOSIS — I5030 Unspecified diastolic (congestive) heart failure: Secondary | ICD-10-CM

## 2014-01-03 DIAGNOSIS — D62 Acute posthemorrhagic anemia: Secondary | ICD-10-CM

## 2014-01-03 LAB — CBC WITH DIFFERENTIAL/PLATELET
BASOS PCT: 0.9 % (ref 0.0–3.0)
Basophils Absolute: 0 10*3/uL (ref 0.0–0.1)
EOS ABS: 0.2 10*3/uL (ref 0.0–0.7)
Eosinophils Relative: 3.3 % (ref 0.0–5.0)
HCT: 37.6 % — ABNORMAL LOW (ref 39.0–52.0)
Hemoglobin: 12.3 g/dL — ABNORMAL LOW (ref 13.0–17.0)
LYMPHS PCT: 21.1 % (ref 12.0–46.0)
Lymphs Abs: 1.1 10*3/uL (ref 0.7–4.0)
MCHC: 32.6 g/dL (ref 30.0–36.0)
MCV: 94.1 fl (ref 78.0–100.0)
Monocytes Absolute: 0.6 10*3/uL (ref 0.1–1.0)
Monocytes Relative: 11.4 % (ref 3.0–12.0)
Neutro Abs: 3.4 10*3/uL (ref 1.4–7.7)
Neutrophils Relative %: 63.3 % (ref 43.0–77.0)
PLATELETS: 245 10*3/uL (ref 150.0–400.0)
RBC: 4 Mil/uL — AB (ref 4.22–5.81)
RDW: 17.6 % — ABNORMAL HIGH (ref 11.5–14.6)
WBC: 5.4 10*3/uL (ref 4.5–10.5)

## 2014-01-03 MED ORDER — AMLODIPINE BESYLATE 5 MG PO TABS: ORAL_TABLET | ORAL | Status: DC

## 2014-01-03 NOTE — Patient Instructions (Signed)
Continue your current therapy  We will check your hemoglobin today  I will see you in 3 months.

## 2014-01-03 NOTE — Progress Notes (Signed)
Patrick Rodriguez Date of Birth: 04/11/1925 Medical Record [id number]  History of Present Illness: Patrick Rodriguez is seen back today for a follow up visit. He has a history of HLD, HTN, CAD with remote stenting of the LAD in 2000, obesity, ED, syncope in 2013 felt to be due to dehydration. HCTZ was stopped at that time. He was seen by Patrick Dopp PA in November with symptoms of dyspnea, lightheadedness, and palpitations. Stress Myoview was normal with EF 70%. Monitor showed PVCs and PACs but no serious arrhythmia. He had a new anemia with Hgb 8.8. He underwent GI evaluation which demonstrated several polyps and diverticulosis. He is on iron therapy. BNP was mildly elevated. He was treated with lasix for HfpEF. He also saw pulmonary and his Symbicort was stopped. Since then his breathing is doing much better. No SOB. No edema. No chest pain. Exercising 30 min/day on treadmill. He has been off Lasix for 3 weeks.  Current Outpatient Prescriptions on File Prior to Visit  Medication Sig Dispense Refill  . amLODipine (NORVASC) 10 MG tablet Take 1 tablet (10 mg total) by mouth daily.  30 tablet  11  . Ascorbic Acid (VITAMIN C) 1000 MG tablet Take 1,000 mg by mouth daily.        Marland Kitchen atenolol (TENORMIN) 25 MG tablet Take 1 tablet (25 mg total) by mouth at bedtime.      Marland Kitchen b complex vitamins tablet Take 1 tablet by mouth daily.        Marland Kitchen doxazosin (CARDURA) 2 MG tablet TAKE 1 TABLET BY MOUTH ONCE DAILY AT BEDTIME  90 tablet  2  . ferrous sulfate 325 (65 FE) MG tablet Take 1 tablet (325 mg total) by mouth 2 (two) times daily with a meal.  60 tablet  3  . magnesium gluconate (MAGONATE) 500 MG tablet Take 500 mg by mouth daily.        . mometasone-formoterol (DULERA) 100-5 MCG/ACT AERO Inhale 2 puffs into the lungs as needed.      . Nattokinase 100 MG CAPS Take by mouth daily.        Marland Kitchen omeprazole (PRILOSEC) 40 MG capsule Take 40 mg by mouth daily.      . vitamin E 400 UNIT capsule Take 400 Units by mouth daily.        Marland Kitchen  zinc gluconate 50 MG tablet Take 50 mg by mouth daily.        . [DISCONTINUED] losartan-hydrochlorothiazide (HYZAAR) 100-25 MG per tablet Take 1 tablet by mouth daily.  30 tablet  5   No current facility-administered medications on file prior to visit.    Allergies  Allergen Reactions  . Hctz [Hydrochlorothiazide]     Has had hyponatremia    Past Medical History  Diagnosis Date  . Hyperlipidemia   . Hypertension   . Coronary artery disease     a.  s/p stenting of the LAD in 2000;  b. Lexiscan Myoview (12/2013): No ischemia, EF 70%; normal study  . Obesity   . ED (erectile dysfunction)   . Allergic rhinitis   . Normal nuclear stress test 2008  . Syncope Jan 2013    felt to be vasovagal; had normal echo, carotids ok, 1st degree AV block with mention (but no tracing) of 2nd degree type I AV block while in [place]  . GERD (gastroesophageal reflux disease)   . Normal echocardiogram Jan 2013    EF of 55% and no wall motion abnormalities  . Carotid artery  stenosis     12/2011:  50-69% on the left and 1-49% on the right;  Carotid US (11/2013): Bilateral 40-59%; repeat 1 year  . Palpitations     Holter (11/2013): NSR, sinus brady, PVCs, rare blocked PAC, no sig arrhythmia    Past Surgical History  Procedure Laterality Date  . Cardiac catheterization  01/20/1999    EF 60%  . Tonsillectomy    . Appendectomy    . Doppler echocardiography  02/06/1998    EF 60%  . Cardiovascular stress test  04/19/2007    EF 74%  . Coronary stent placement  2000    LAD    History  Smoking status  . Former Smoker -- 2.00 packs/day for 28 years  . Types: Cigarettes  . Quit date: 12/06/1968  Smokeless tobacco  . Never Used    History  Alcohol Use No    Family History  Problem Relation Age of Onset  . Aneurysm Mother   . Stomach cancer Father     Review of Systems: As noted in history of present illness. All other systems were reviewed and are negative.  Physical Exam: BP 130/56  Pulse 63   Ht 5\' 9"  (1.753 m)  Wt 173 lb 12.8 oz (78.835 kg)  BMI 25.65 kg/m2  SpO2 99% Patient is very pleasant and in no acute distress.  Skin is warm and dry. Color is normal.  HEENT is unremarkable. Normocephalic/atraumatic. PERRL. Sclera are nonicteric. Neck is supple. No masses. No JVD. Lungs are clear. Cardiac exam shows a regular rate and rhythm. Abdomen is soft. Extremities are with trace edema. Gait and ROM are intact. No gross neurologic deficits noted. He has a mild macular rash.   LABORATORY DATA:  Lab Results  Component Value Date   WBC 8.5 11/21/2013   HGB 8.9* 11/21/2013   HCT 26.4 Repeated and verified X2.* 11/21/2013   PLT 251.0 11/21/2013   GLUCOSE 99 12/14/2013   CHOL 157 07/05/2013   TRIG 46.0 07/05/2013   HDL 38.90* 07/05/2013   LDLCALC 109* 07/05/2013   ALT 17 07/05/2013   AST 24 07/05/2013   NA 135 12/14/2013   K 3.8 12/14/2013   CL 102 12/14/2013   CREATININE 1.3 12/14/2013   BUN 17 12/14/2013   CO2 27 12/14/2013   TSH 1.46 07/05/2013   PSA 3.18 07/05/2013   INR 1.11 08/11/2011   Cardiology Nuclear Med Study  Patrick Rodriguez is a 80 y.o. male MRN : [id number] DOB: 1925/09/23  Procedure Date: 12/11/2013  Nuclear Med Background  Indication for Stress Test: Evaluation for Ischemia and Stent Patency  History: Asthma, COPD and CAD, MI-Stent-LAD, MPI:2008 NL EF: 74%, 2013 ECHO: EF: 55%  Cardiac Risk Factors: Carotid Disease, History of Smoking, Hypertension and Lipids  Symptoms: DOE, Near Syncope, Palpitations and SOB  Nuclear Pre-Procedure  Caffeine/Decaff Intake: None  NPO After: 7:00pm   Lungs: clear  O2 Sat: 98% on room air.  IV 0.9% NS with Angio Cath: 22g   IV Site: R Wrist  IV Started by: Matilde Haymaker, RN   Chest Size (in): 42  Cup Size: n/a   Height: 5\' 9"  (1.753 m)  Weight: 173 lb (78.472 kg)   BMI: Body mass index is 25.54 kg/(m^2).  Tech Comments: Tenormin taken at 0800   Nuclear Med Study  1 or 2 day study: 1 day  Stress Test Type: Carlton Adam   Reading MD: n/a  Order  Authorizing Provider: Geanie Cooley and Lutricia Horsfall   Resting Radionuclide:  Technetium 55m Sestamibi  Resting Radionuclide Dose: 11.0 mCi   Stress Radionuclide: Technetium 60m Sestamibi  Stress Radionuclide Dose: 33.0 mCi   Stress Protocol  Rest HR: 60  Stress HR: 73   Rest BP: 154/71  Stress BP: 153/65   Exercise Time (min): n/a  METS: n/a   Predicted Max HR: 134 bpm  % Max HR: 54.48 bpm  Rate Pressure Product: 11242  Dose of Adenosine (mg): n/a  Dose of Lexiscan: 0.4 mg   Dose of Atropine (mg): n/a  Dose of Dobutamine: n/a mcg/kg/min (at max HR)   Stress Test Technologist: Perrin Maltese, EMT-P  Nuclear Technologist: Charlton Amor, CNMT   Rest Procedure: Myocardial perfusion imaging was performed at rest 45 minutes following the intravenous administration of Technetium 54m Sestamibi.  Rest ECG: Normal resting EKG  Stress Procedure: The patient received IV Lexiscan 0.4 mg over 15-seconds. Technetium 60m Sestamibi injected at 30-seconds. This patient had chest pressure, felt fuzzy, and had a headache with the Lexiscan injection.Quantitative spect images were obtained after a 45 minute delay.  Stress ECG: No significant change from baseline ECG  QPS  Raw Data Images: Normal; no motion artifact; normal heart/lung ratio.  Stress Images: Normal homogeneous uptake in all areas of the myocardium.  Rest Images: Normal homogeneous uptake in all areas of the myocardium.  Subtraction (SDS): No evidence of ischemia.  Transient Ischemic Dilatation (Normal <1.22): 0.93  Lung/Heart Ratio (Normal <0.45): 0.38  Quantitative Gated Spect Images  QGS EDV: 107 ml  QGS ESV: 32 ml  Impression  Exercise Capacity: Lexiscan with no exercise.  BP Response: Normal blood pressure response.  Clinical Symptoms: Chest pressure  ECG Impression: No significant ST segment change suggestive of ischemia.  Comparison with Prior Nuclear Study: I have compared this study with the report of the study from May,  2008.  Overall Impression: Normal stress nuclear study. This is a low-risk scan. There is no scar or ischemia. There is no change in the study from the report of the study of 2008. The patient did have PVCs that were scattered throughout the study.  LV Ejection Fraction: 70%. LV Wall Motion: Normal Wall Motion.  Dola Argyle, MD        Assessment / Plan: 1. Coronary disease with prior stenting of LAD in 2000. Recent myoview is normal. He remains asymptomatic. We'll continue medical therapy.  2. Hypertension blood pressure is well controlled.  3. Hyperlipidemia.  4. History of vasovagal syncope.  5. Chronic diastolic heart failure. I think his recent volume overload was related to development of anemia with high output failure. Symptoms now resolved. Will stay off lasix and observe. Weight is back to baseline.  6. Anemia. Follow up CBC today on iron therapy  I will follow up in 3 months.

## 2014-02-06 ENCOUNTER — Other Ambulatory Visit: Payer: Self-pay | Admitting: Cardiology

## 2014-03-06 ENCOUNTER — Ambulatory Visit (INDEPENDENT_AMBULATORY_CARE_PROVIDER_SITE_OTHER): Payer: Medicare Other | Admitting: Family Medicine

## 2014-03-06 ENCOUNTER — Ambulatory Visit: Payer: Medicare Other

## 2014-03-06 VITALS — BP 138/54 | HR 63 | Temp 98.1°F | Resp 16 | Ht 67.0 in | Wt 175.0 lb

## 2014-03-06 DIAGNOSIS — L03115 Cellulitis of right lower limb: Secondary | ICD-10-CM

## 2014-03-06 DIAGNOSIS — J209 Acute bronchitis, unspecified: Secondary | ICD-10-CM

## 2014-03-06 DIAGNOSIS — M79609 Pain in unspecified limb: Secondary | ICD-10-CM

## 2014-03-06 DIAGNOSIS — R05 Cough: Secondary | ICD-10-CM

## 2014-03-06 DIAGNOSIS — R053 Chronic cough: Secondary | ICD-10-CM

## 2014-03-06 LAB — POCT CBC
Granulocyte percent: 70.5 %G (ref 37–80)
HCT, POC: 38.9 % — AB (ref 43.5–53.7)
Hemoglobin: 12.7 g/dL — AB (ref 14.1–18.1)
Lymph, poc: 1.9 (ref 0.6–3.4)
MCH, POC: 30.5 pg (ref 27–31.2)
MCHC: 32.6 g/dL (ref 31.8–35.4)
MCV: 93.2 fL (ref 80–97)
MID (cbc): 1 — AB (ref 0–0.9)
MPV: 9.1 fL (ref 0–99.8)
POC Granulocyte: 7 — AB (ref 2–6.9)
POC LYMPH PERCENT: 19.3 %L (ref 10–50)
POC MID %: 10.2 %M (ref 0–12)
Platelet Count, POC: 302 10*3/uL (ref 142–424)
RBC: 4.17 M/uL — AB (ref 4.69–6.13)
RDW, POC: 20.4 %
WBC: 9.9 10*3/uL (ref 4.6–10.2)

## 2014-03-06 LAB — POCT SEDIMENTATION RATE: POCT SED RATE: 46 mm/hr — AB (ref 0–22)

## 2014-03-06 MED ORDER — MOMETASONE FURO-FORMOTEROL FUM 100-5 MCG/ACT IN AERO: 2.0000 | INHALATION_SPRAY | RESPIRATORY_TRACT | Status: DC | PRN

## 2014-03-06 MED ORDER — DOXYCYCLINE HYCLATE 100 MG PO TABS: 100.0000 mg | ORAL_TABLET | Freq: Two times a day (BID) | ORAL | Status: DC

## 2014-03-06 NOTE — Patient Instructions (Signed)
Cellulitis Cellulitis is an infection of the skin and the tissue beneath it. The infected area is usually red and tender. Cellulitis occurs most often in the arms and lower legs.  CAUSES  Cellulitis is caused by bacteria that enter the skin through cracks or cuts in the skin. The most common types of bacteria that cause cellulitis are Staphylococcus and Streptococcus. SYMPTOMS   Redness and warmth.  Swelling.  Tenderness or pain.  Fever. DIAGNOSIS  Your caregiver can usually determine what is wrong based on a physical exam. Blood tests may also be done. TREATMENT  Treatment usually involves taking an antibiotic medicine. HOME CARE INSTRUCTIONS   Take your antibiotics as directed. Finish them even if you start to feel better.  Keep the infected arm or leg elevated to reduce swelling.  Apply a warm cloth to the affected area up to 4 times per day to relieve pain.  Only take over-the-counter or prescription medicines for pain, discomfort, or fever as directed by your caregiver.  Keep all follow-up appointments as directed by your caregiver. SEEK MEDICAL CARE IF:   You notice red streaks coming from the infected area.  Your red area gets larger or turns dark in color.  Your bone or joint underneath the infected area becomes painful after the skin has healed.  Your infection returns in the same area or another area.  You notice a swollen bump in the infected area.  You develop new symptoms. SEEK IMMEDIATE MEDICAL CARE IF:   You have a fever.  You feel very sleepy.  You develop vomiting or diarrhea.  You have a general ill feeling (malaise) with muscle aches and pains. MAKE SURE YOU:   Understand these instructions.  Will watch your condition.  Will get help right away if you are not doing well or get worse. Document Released: 09/01/2005 Document Revised: 05/23/2012 Document Reviewed: 02/07/2012 [employer] Patient Information 2014 [employer].  

## 2014-03-06 NOTE — Progress Notes (Addendum)
Subjective:   This chart was scribed for Robyn Haber, MD by Maree Erie, ED Scribe.   Chief Complaint  Patient presents with   Leg Injury    x 10 days ago pain getting worse    PCP: Robyn Haber, MD    Patient ID: Patrick Rodriguez, male    DOB: 25-Oct-1925, 80 y.o.   MRN: [id number]  HPI  Patrick Rodriguez is a 80 y.o. male who presents to office complaining of constant, worsening left calf pain that began ten days ago. He states that he rubbed his right mid lateral calf up against a pot in his garage and felt immediate pain. He denies any noticable skin tears at that time. However, he states the pain has been getting worse and there has been spreading erythema that has extended down to his ankle and foot.   He has been using Dulera as needed for GERD. He states the medication worked well and would like a refill if possible.    Past Medical History  Diagnosis Date   Hyperlipidemia    Hypertension    Coronary artery disease     a.  s/p stenting of the LAD in 2000;  b. Lexiscan Myoview (12/2013): No ischemia, EF 70%; normal study   Obesity    ED (erectile dysfunction)    Allergic rhinitis    Normal nuclear stress test 2008   Syncope Jan 2013    felt to be vasovagal; had normal echo, carotids ok, 1st degree AV block with mention (but no tracing) of 2nd degree type I AV block while in [place]   GERD (gastroesophageal reflux disease)    Normal echocardiogram Jan 2013    EF of 55% and no wall motion abnormalities   Carotid artery stenosis     12/2011:  50-69% on the left and 1-49% on the right;  Carotid US (11/2013): Bilateral 40-59%; repeat 1 year   Palpitations     Holter (11/2013): NSR, sinus brady, PVCs, rare blocked PAC, no sig arrhythmia    Review of Systems  Consitutional: No fever, chills, fatigue, night sweats, lymphadenopathy, or weight changes. Eyes: No visual changes, eye redness, or discharge. ENT/Mouth: Ears: No otalgia, tinnitus, hearing loss, discharge.  Nose: No congestion, rhinorrhea, sinus pain, or epistaxis. Throat: No sore throat, post nasal drip, or teeth pain. Cardiovascular: No CP, palpitations, diaphoresis, DOE, edema, orthopnea, PND. Respiratory: No  hemoptysis, SOB, or wheezing.  C/o chronic intermittent cough Gastrointestinal: No anorexia, dysphagia, reflux, pain, nausea, vomiting, hematemesis, diarrhea, constipation, BRBPR, or melena. Genitourinary: No dysuria, frequency, urgency, hematuria, incontinence, nocturia, decreased urinary stream, discharge, impotence, or testicular pain/masses. Musculoskeletal: No decreased ROM, myalgias, stiffness, joint swelling, or weakness. Skin: No lesion changes, jaundice, or pruritis. Positive for erythema, swelling and pain to right calf/ankle Neurological: No headache, dizziness, syncope, seizures, tremors, memory loss, coordination problems, or paresthesias. Psychological: No anxiety, depression, hallucinations, SI/HI. Endocrine: No fatigue, polydipsia, polyphagia or polyuria. All other systems were reviewed and are otherwise negative.      Objective:   Physical Exam BP 138/54   Pulse 63   Temp(Src) 98.1 F (36.7 C) (Oral)   Resp 16   Ht 5\' 7"  (1.702 m)   Wt 175 lb (79.379 kg)   BMI 27.40 kg/m2   SpO2 98%  General: Well-developed, well-nourished male in no acute distress; appearance consistent with age of record HENT: normocephalic; atraumatic Eyes: pupils equal, round and reactive to light; extraocular muscles intact Neck: supple Heart: regular rate and rhythm; no murmurs,  rubs or gallops Lungs: clear to auscultation bilaterally Abdomen: soft; nondistended; nontender; no masses or hepatosplenomegaly; bowel sounds present Extremities: No deformity; full range of motion; pulses normal Neurologic: Awake, alert and oriented; motor function intact in all extremities and symmetric; no facial droop Skin: Warm and dry, no posterior calf swelling or tenderness; right lateral calf has area of  erythema that extends down to ankle with linear ecchymosis right lateral posterior foot. Psychiatric: Normal mood and affect  [facility] reading (PRIMARY) by  Dr. Joseph Art:  Right tib/fib:.no acute abnormality  Results for orders placed in visit on 03/06/14  POCT CBC      Result Value Ref Range   WBC 9.9  4.6 - 10.2 K/uL   Lymph, poc 1.9  0.6 - 3.4   POC LYMPH PERCENT 19.3  10 - 50 %L   MID (cbc) 1.0 (*) 0 - 0.9   POC MID % 10.2  0 - 12 %M   POC Granulocyte 7.0 (*) 2 - 6.9   Granulocyte percent 70.5  37 - 80 %G   RBC 4.17 (*) 4.69 - 6.13 M/uL   Hemoglobin 12.7 (*) 14.1 - 18.1 g/dL   HCT, POC 38.9 (*) 43.5 - 53.7 %   MCV 93.2  80 - 97 fL   MCH, POC 30.5  27 - 31.2 pg   MCHC 32.6  31.8 - 35.4 g/dL   RDW, POC 20.4     Platelet Count, POC 302  142 - 424 K/uL   MPV 9.1  0 - 99.8 fL       Assessment & Plan:   I personally performed the services described in this documentation, which was scribed in my presence. The recorded information has been reviewed and is accurate.  Chronic cough - Plan: mometasone-formoterol (DULERA) 100-5 MCG/ACT AERO  Cellulitis of leg, right - Plan: DG Tibia/Fibula Right, POCT CBC, POCT SEDIMENTATION RATE, doxycycline (VIBRA-TABS) 100 MG tablet  Signed, Robyn Haber, MD

## 2014-03-08 ENCOUNTER — Telehealth: Payer: Self-pay

## 2014-03-08 NOTE — Telephone Encounter (Signed)
Patient called stated he seen Dr. Joseph Art on Wednesday. Patient stated antibiotic prescribed for his Staph Infection is not getting better. His right leg is not improving and he request for Dr. Carlean Jews to give him a call at [phone].

## 2014-03-08 NOTE — Telephone Encounter (Signed)
Please have patient return for recheck and possible antibiotic shot

## 2014-03-09 ENCOUNTER — Ambulatory Visit (INDEPENDENT_AMBULATORY_CARE_PROVIDER_SITE_OTHER): Payer: Medicare Other | Admitting: Emergency Medicine

## 2014-03-09 VITALS — BP 130/58 | HR 67 | Temp 97.6°F | Resp 16 | Ht 67.0 in | Wt 175.0 lb

## 2014-03-09 DIAGNOSIS — L02419 Cutaneous abscess of limb, unspecified: Secondary | ICD-10-CM

## 2014-03-09 DIAGNOSIS — L03119 Cellulitis of unspecified part of limb: Principal | ICD-10-CM

## 2014-03-09 DIAGNOSIS — D62 Acute posthemorrhagic anemia: Secondary | ICD-10-CM

## 2014-03-09 LAB — POCT CBC
GRANULOCYTE PERCENT: 64.6 % (ref 37–80)
HCT, POC: 31.2 % — AB (ref 43.5–53.7)
Hemoglobin: 10 g/dL — AB (ref 14.1–18.1)
Lymph, poc: 1.7 (ref 0.6–3.4)
MCH, POC: 29.7 pg (ref 27–31.2)
MCHC: 32.1 g/dL (ref 31.8–35.4)
MCV: 92.6 fL (ref 80–97)
MID (CBC): 0.6 (ref 0–0.9)
MPV: 8.3 fL (ref 0–99.8)
PLATELET COUNT, POC: 247 10*3/uL (ref 142–424)
POC Granulocyte: 4.2 (ref 2–6.9)
POC LYMPH %: 26.5 % (ref 10–50)
POC MID %: 8.9 %M (ref 0–12)
RBC: 3.37 M/uL — AB (ref 4.69–6.13)
RDW, POC: 19.8 %
WBC: 6.5 10*3/uL (ref 4.6–10.2)

## 2014-03-09 LAB — IFOBT (OCCULT BLOOD): IFOBT: NEGATIVE

## 2014-03-09 MED ORDER — CLINDAMYCIN HCL 300 MG PO CAPS: 300.0000 mg | ORAL_CAPSULE | Freq: Three times a day (TID) | ORAL | Status: DC

## 2014-03-09 NOTE — Telephone Encounter (Signed)
Pt is currently here to recheck

## 2014-03-09 NOTE — Patient Instructions (Addendum)
Please stop your doxycycline. We are going to treat you with clindamycin. please return to clinic in 48-72 hours for repeat hemoglobin and to see if the new antibiotic is helping   Anemia, Nonspecific Anemia is a condition in which the concentration of red blood cells or hemoglobin in the blood is below normal. Hemoglobin is a substance in red blood cells that carries oxygen to the tissues of the body. Anemia results in not enough oxygen reaching these tissues.  CAUSES  Common causes of anemia include:   Excessive bleeding. Bleeding may be internal or external. This includes excessive bleeding from periods (in women) or from the intestine.   Poor nutrition.   Chronic kidney, thyroid, and liver disease.  Bone marrow disorders that decrease red blood cell production.  Cancer and treatments for cancer.  HIV, AIDS, and their treatments.  Spleen problems that increase red blood cell destruction.  Blood disorders.  Excess destruction of red blood cells due to infection, medicines, and autoimmune disorders. SIGNS AND SYMPTOMS   Minor weakness.   Dizziness.   Headache.  Palpitations.   Shortness of breath, especially with exercise.   Paleness.  Cold sensitivity.  Indigestion.  Nausea.  Difficulty sleeping.  Difficulty concentrating. Symptoms may occur suddenly or they may develop slowly.  DIAGNOSIS  Additional blood tests are often needed. These help your health care provider determine the best treatment. Your health care provider will check your stool for blood and look for other causes of blood loss.  TREATMENT  Treatment varies depending on the cause of the anemia. Treatment can include:   Supplements of iron, vitamin D78, or folic acid.   Hormone medicines.   A blood transfusion. This may be needed if blood loss is severe.   Hospitalization. This may be needed if there is significant continual blood loss.   Dietary changes.  Spleen  removal. HOME CARE INSTRUCTIONS Keep all follow-up appointments. It often takes many weeks to correct anemia, and having your health care provider check on your condition and your response to treatment is very important. SEEK IMMEDIATE MEDICAL CARE IF:   You develop extreme weakness, shortness of breath, or chest pain.   You become dizzy or have trouble concentrating.  You develop heavy vaginal bleeding.   You develop a rash.   You have bloody or black, tarry stools.   You faint.   You vomit up blood.   You vomit repeatedly.   You have abdominal pain.  You have a fever or persistent symptoms for more than 2 3 days.   You have a fever and your symptoms suddenly get worse.   You are dehydrated.  MAKE SURE YOU:  Understand these instructions.  Will watch your condition.  Will get help right away if you are not doing well or get worse. Document Released: 12/30/2004 Document Revised: 07/25/2013 Document Reviewed: 05/18/2013 [facility] Patient Information 2014 [place]. Cellulitis Cellulitis is an infection of the skin and the tissue beneath it. The infected area is usually red and tender. Cellulitis occurs most often in the arms and lower legs.  CAUSES  Cellulitis is caused by bacteria that enter the skin through cracks or cuts in the skin. The most common types of bacteria that cause cellulitis are Staphylococcus and Streptococcus. SYMPTOMS   Redness and warmth.  Swelling.  Tenderness or pain.  Fever. DIAGNOSIS  Your caregiver can usually determine what is wrong based on a physical exam. Blood tests may also be done. TREATMENT  Treatment usually  involves taking an antibiotic medicine. HOME CARE INSTRUCTIONS   Take your antibiotics as directed. Finish them even if you start to feel better.  Keep the infected arm or leg elevated to reduce swelling.  Apply a warm cloth to the affected area up to 4 times per day to relieve pain.  Only take  over-the-counter or prescription medicines for pain, discomfort, or fever as directed by your caregiver.  Keep all follow-up appointments as directed by your caregiver. SEEK MEDICAL CARE IF:   You notice red streaks coming from the infected area.  Your red area gets larger or turns dark in color.  Your bone or joint underneath the infected area becomes painful after the skin has healed.  Your infection returns in the same area or another area.  You notice a swollen bump in the infected area.  You develop new symptoms. SEEK IMMEDIATE MEDICAL CARE IF:   You have a fever.  You feel very sleepy.  You develop vomiting or diarrhea.  You have a general ill feeling (malaise) with muscle aches and pains. MAKE SURE YOU:   Understand these instructions.  Will watch your condition.  Will get help right away if you are not doing well or get worse. Document Released: 09/01/2005 Document Revised: 05/23/2012 Document Reviewed: 02/07/2012 [facility] Patient Information 2014 [employer].

## 2014-03-09 NOTE — Progress Notes (Addendum)
Subjective:    Patient ID: Patrick Rodriguez, male    DOB: June 30, 1925, 80 y.o.   MRN: [id number]  Wound Check   Chief Complaint  Patient presents with  . Wound Check    rt leg     This chart was scribed for Arlyss Queen, MD by Thea Alken, ED Scribe. This patient was seen in room 2 and the patient's care was started at 10:14 AM.  HPI Comments: Patrick Rodriguez is a 80 y.o. male who presents to the [facility] for a follow up of cellulitis of right lower calf. Pt was seen by Dr. Joseph Art 4 days ago. He reports that it has been 4 days since he was last seen and reports that his leg is not better but has not worsened .Pt reports that he bruises easily but does not recall how he bruised his leg. He reports that in the morning when he first gets out of bed to walk he feels pain in right leg. He reports that he has been using a warm damp cloth on his leg. Pt denies being on blood thinners.. Pt denies feeling ill. Pt denies pain in groin.    Past Medical History  Diagnosis Date  . Hyperlipidemia   . Hypertension   . Coronary artery disease     a.  s/p stenting of the LAD in 2000;  b. Lexiscan Myoview (12/2013): No ischemia, EF 70%; normal study  . Obesity   . ED (erectile dysfunction)   . Allergic rhinitis   . Normal nuclear stress test 2008  . Syncope Jan 2013    felt to be vasovagal; had normal echo, carotids ok, 1st degree AV block with mention (but no tracing) of 2nd degree type I AV block while in [place]  . GERD (gastroesophageal reflux disease)   . Normal echocardiogram Jan 2013    EF of 55% and no wall motion abnormalities  . Carotid artery stenosis     12/2011:  50-69% on the left and 1-49% on the right;  Carotid US (11/2013): Bilateral 40-59%; repeat 1 year  . Palpitations     Holter (11/2013): NSR, sinus brady, PVCs, rare blocked PAC, no sig arrhythmia   Allergies  Allergen Reactions  . Hctz [Hydrochlorothiazide]     Has had hyponatremia   Prior to Admission  medications   Medication Sig Start Date End Date Taking? Authorizing Provider  amLODipine (NORVASC) 5 MG tablet Take 5 mg twice a day 01/03/14  Yes Peter M Martinique, MD  Ascorbic Acid (VITAMIN C) 1000 MG tablet Take 1,000 mg by mouth daily.     Yes Historical Provider, MD  atenolol (TENORMIN) 25 MG tablet Take 12.5 mg by mouth at bedtime. 12/14/13  Yes Liliane Shi, PA-C  b complex vitamins tablet Take 1 tablet by mouth daily.     Yes Historical Provider, MD  doxazosin (CARDURA) 2 MG tablet TAKE 1 TABLET BY MOUTH ONCE DAILY AT BEDTIME   Yes Peter M Martinique, MD  doxycycline (VIBRA-TABS) 100 MG tablet Take 1 tablet (100 mg total) by mouth 2 (two) times daily. 03/06/14  Yes Robyn Haber, MD  magnesium gluconate (MAGONATE) 500 MG tablet Take 500 mg by mouth daily.     Yes Historical Provider, MD  Nattokinase 100 MG CAPS Take by mouth daily.     Yes Historical Provider, MD  omeprazole (PRILOSEC) 40 MG capsule Take 40 mg by mouth daily.   Yes Historical Provider, MD  vitamin E  400 UNIT capsule Take 400 Units by mouth daily.     Yes Historical Provider, MD  zinc gluconate 50 MG tablet Take 50 mg by mouth daily.     Yes Historical Provider, MD  mometasone-formoterol (DULERA) 100-5 MCG/ACT AERO Inhale 2 puffs into the lungs as needed. 03/06/14   Robyn Haber, MD   Review of Systems  Constitutional: Negative for fever and chills.  Genitourinary: Negative for genital sores.  Hematological: Bruises/bleeds easily.      Objective:   Physical Exam CONSTITUTIONAL: Well developed/well nourished HEAD: Normocephalic/atraumatic EYES: EOMI/PERRL ENMT: Mucous membranes moist NECK: supple no meningeal signs SPINE:entire spine nontender CV: S1/S2 noted, no murmurs/rubs/gallops noted LUNGS: Lungs are clear to auscultation bilaterally, no apparent distress ABDOMEN: soft, nontender, no rebound or guarding GU:no cva tenderness NEURO: Pt is awake/alert, moves all extremitiesx4 EXTREMITIES: There is tenderness and  redness and some induration that starts at the right lateral mid lower leg and extends down to the lateral malleolus. There is induration of the skin in this area and it is tender he has a normal capillary fill to. SKIN: warm, color normal PSYCH: no abnormalities of mood noted    Results for orders placed in visit on 03/09/14  POCT CBC      Result Value Ref Range   WBC 6.5  4.6 - 10.2 K/uL   Lymph, poc 1.7  0.6 - 3.4   POC LYMPH PERCENT 26.5  10 - 50 %L   MID (cbc) 0.6  0 - 0.9   POC MID % 8.9  0 - 12 %M   POC Granulocyte 4.2  2 - 6.9   Granulocyte percent 64.6  37 - 80 %G   RBC 3.37 (*) 4.69 - 6.13 M/uL   Hemoglobin 10.0 (*) 14.1 - 18.1 g/dL   HCT, POC 31.2 (*) 43.5 - 53.7 %   MCV 92.6  80 - 97 fL   MCH, POC 29.7  27 - 31.2 pg   MCHC 32.1  31.8 - 35.4 g/dL   RDW, POC 19.8     Platelet Count, POC 247  142 - 424 K/uL   MPV 8.3  0 - 99.8 fL   Results for orders placed in visit on 03/09/14  POCT CBC      Result Value Ref Range   WBC 6.5  4.6 - 10.2 K/uL   Lymph, poc 1.7  0.6 - 3.4   POC LYMPH PERCENT 26.5  10 - 50 %L   MID (cbc) 0.6  0 - 0.9   POC MID % 8.9  0 - 12 %M   POC Granulocyte 4.2  2 - 6.9   Granulocyte percent 64.6  37 - 80 %G   RBC 3.37 (*) 4.69 - 6.13 M/uL   Hemoglobin 10.0 (*) 14.1 - 18.1 g/dL   HCT, POC 31.2 (*) 43.5 - 53.7 %   MCV 92.6  80 - 97 fL   MCH, POC 29.7  27 - 31.2 pg   MCHC 32.1  31.8 - 35.4 g/dL   RDW, POC 19.8     Platelet Count, POC 247  142 - 424 K/uL   MPV 8.3  0 - 99.8 fL  IFOBT (OCCULT BLOOD)      Result Value Ref Range   IFOBT Negative     Assessment & Plan:  I am concerned he is not responding to doxycycline. He did have an injury 10 days ago and I suspect he developed a hematoma in this area and now is  developing a cellulitis. We'll check another white count today and change to Cleocin 300 3 times a day and recheck the leg in 48-72 hours. His hemoglobin has dropped significantly since his last visit. His stool was negative for blood.  Recheck in 72 hours with repeat hemoglobin at that time. I personally performed the services described in this documentation, which was scribed in my presence. The recorded information has been reviewed and is accurate.

## 2014-03-13 ENCOUNTER — Ambulatory Visit (INDEPENDENT_AMBULATORY_CARE_PROVIDER_SITE_OTHER): Payer: Medicare Other | Admitting: Emergency Medicine

## 2014-03-13 VITALS — BP 120/76 | HR 68 | Temp 98.0°F | Resp 17 | Ht 67.5 in | Wt 176.0 lb

## 2014-03-13 DIAGNOSIS — D62 Acute posthemorrhagic anemia: Secondary | ICD-10-CM

## 2014-03-13 DIAGNOSIS — L02419 Cutaneous abscess of limb, unspecified: Secondary | ICD-10-CM

## 2014-03-13 DIAGNOSIS — L03119 Cellulitis of unspecified part of limb: Secondary | ICD-10-CM

## 2014-03-13 LAB — POCT CBC
Granulocyte percent: 69 %G (ref 37–80)
HEMATOCRIT: 37.6 % — AB (ref 43.5–53.7)
HEMOGLOBIN: 12.1 g/dL — AB (ref 14.1–18.1)
LYMPH, POC: 2.1 (ref 0.6–3.4)
MCH: 30.3 pg (ref 27–31.2)
MCHC: 32.2 g/dL (ref 31.8–35.4)
MCV: 94.1 fL (ref 80–97)
MID (cbc): 0.7 (ref 0–0.9)
MPV: 8.3 fL (ref 0–99.8)
POC Granulocyte: 6.2 (ref 2–6.9)
POC LYMPH PERCENT: 23.2 %L (ref 10–50)
POC MID %: 7.8 % (ref 0–12)
Platelet Count, POC: 305 10*3/uL (ref 142–424)
RBC: 4 M/uL — AB (ref 4.69–6.13)
RDW, POC: 18.4 %
WBC: 9 10*3/uL (ref 4.6–10.2)

## 2014-03-13 NOTE — Progress Notes (Signed)
  This chart was scribed Darlyne Russian by Allena Earing, ED Scribe. This patient was seen in room 10 and the patient's care was started at 8:42 AM.  Subjective:    Patient ID: Patrick Rodriguez, male    DOB: [birth year], 80 y.o.   MRN: [id number]  HPI  HPI Comments: Patrick Rodriguez is a 80 y.o. male who presents to the [facility] complaining for a f/u on the cellulitis on his lower right leg. Pt completed a 10 day course of doxycycline with no improvement. Was changed to clindamycin, today is day 4 of treatment. Pt reports improvement in pain and ambulation. Denies dyspnea, diarrhea, numbness, tingling in extremities.    Review of Systems  Constitutional: Negative for fever, chills, appetite change and fatigue.  Respiratory: Negative for choking, chest tightness and shortness of breath.   Cardiovascular: Positive for palpitations. Negative for chest pain.  Gastrointestinal: Negative for nausea, abdominal pain, diarrhea, blood in stool and anal bleeding.  Musculoskeletal: Negative for gait problem.       Admits swelling in right lower extremity. Secondary to trauma, but improving.   Skin: Positive for wound. Negative for rash.  Neurological: Negative for dizziness, syncope and headaches.       Objective:   Physical Exam  Constitutional: He appears well-developed and well-nourished.  HENT:  Head: Normocephalic and atraumatic.  Eyes: Pupils are equal, round, and reactive to light.  Cardiovascular: Normal rate and regular rhythm.   Pulmonary/Chest: Effort normal and breath sounds normal.  Musculoskeletal:       Right knee: He exhibits swelling, ecchymosis and erythema. He exhibits normal range of motion. Tenderness found.       Legs: Right leg  9 cm loculated hematoma on the right lateral calf/shin. With associated cellulitic process, distal to hematoma.  Improving from prior exams.          Assessment & Plan:   Filed Vitals:   03/13/14 0820  BP: 120/76    Pulse: 68  Temp: 98 F (36.7 C)  Resp: 17       8:53 AM-Discussed treatment plan which includes recheck of CBC due to decrease of hemoglobin at last visit. Will recheck on return to clinic on 03/18/14, once course of clindamycin is complete. Discussed with pt at bedside and pt agreed to plan.   Patient is improving. His CBC shows a stable hemoglobin. I suspect the last Hb. Was lab error. He will continue on clindamycin recheck on Monday hopefully stop his antibiotics at that time. He has a residual seroma.  I personally performed the services described in this documentation, which was scribed in my presence. The recorded information has been reviewed and is accurate.

## 2014-03-13 NOTE — Patient Instructions (Signed)
Keep the leg elevated whenever you can. Return to see me on Monday

## 2014-03-18 ENCOUNTER — Ambulatory Visit (INDEPENDENT_AMBULATORY_CARE_PROVIDER_SITE_OTHER): Payer: Medicare Other | Admitting: Family Medicine

## 2014-03-18 VITALS — BP 128/54 | HR 60 | Temp 98.1°F | Resp 16 | Ht 66.0 in | Wt 174.2 lb

## 2014-03-18 DIAGNOSIS — L0291 Cutaneous abscess, unspecified: Secondary | ICD-10-CM

## 2014-03-18 DIAGNOSIS — J441 Chronic obstructive pulmonary disease with (acute) exacerbation: Secondary | ICD-10-CM

## 2014-03-18 DIAGNOSIS — L0390 Cellulitis, unspecified: Secondary | ICD-10-CM

## 2014-03-18 MED ORDER — BECLOMETHASONE DIPROPIONATE 80 MCG/ACT IN AERS: 2.0000 | INHALATION_SPRAY | Freq: Every day | RESPIRATORY_TRACT | Status: DC

## 2014-03-18 MED ORDER — CLINDAMYCIN HCL 300 MG PO CAPS: 300.0000 mg | ORAL_CAPSULE | Freq: Three times a day (TID) | ORAL | Status: DC

## 2014-03-18 NOTE — Progress Notes (Signed)
° °  Subjective:  This chart was scribed for Robyn Haber, MD by Terressa Koyanagi, ED Scribe at [facility]. This patient was seen in room Room 11 and the patient's care was started at 9:07 AM.   Patient ID: Patrick Rodriguez, male    DOB: November 29, 1925, 80 y.o.   MRN: [id number]  HPI HPI Comments: Patrick Rodriguez is a 80 y.o. male, with a history of HLD, HTN, MI, COPD, CAD, edema, syncope, HFpEF, cough and allergic rhinitis, who presents to the Emergency Department for a follow-up regarding cellulitis on his lower right leg. Pt has been on clindamycin, today is the last day of his treatment. Pt reports improvement in ambulation and pain. Pt reports that the area continues to be itchy, however, notes that there is quite a bit of dry skin in the area.   Pt also requests a prescription that is more affordable for an inhaler for his asthma exacerbation.    Pt reports he is going [place] beach for [place] and wants to ensure he is safe to go on vacation.   Review of Systems  Skin:       Cellulitis on lower right extremity. Dry skin on lower right extremity.   Hematological: Bruises/bleeds easily (81 MG aspirin ).  All other systems reviewed and are negative.      Objective:   Physical Exam  Constitutional: He appears well-developed and well-nourished.  Skin:  No erythema but swelling on his right lower lateral aspect of his leg.           Assessment & Plan:  9:18 AM-Discussed treatment plan which includes a prescription of clindamycin and Qvar with pt at bedside and pt agreed to plan.    COPD exacerbation - Plan: beclomethasone (QVAR) 80 MCG/ACT inhaler  Cellulitis Patient given prescription for clindamycin in case he has a flareup while at the beach.  Signed, Robyn Haber, MD

## 2014-04-02 ENCOUNTER — Ambulatory Visit: Payer: Medicare Other | Admitting: Cardiology

## 2014-04-17 ENCOUNTER — Ambulatory Visit: Payer: Medicare Other | Admitting: Physician Assistant

## 2014-05-06 ENCOUNTER — Ambulatory Visit (INDEPENDENT_AMBULATORY_CARE_PROVIDER_SITE_OTHER): Payer: Medicare Other | Admitting: Family Medicine

## 2014-05-06 VITALS — BP 140/75 | HR 86 | Temp 98.7°F | Resp 18 | Ht 67.0 in | Wt 174.0 lb

## 2014-05-06 DIAGNOSIS — S51809A Unspecified open wound of unspecified forearm, initial encounter: Secondary | ICD-10-CM

## 2014-05-06 DIAGNOSIS — Z23 Encounter for immunization: Secondary | ICD-10-CM

## 2014-05-06 NOTE — Patient Instructions (Signed)
You got a tetanus shot today. Keep an eye on your arm, and keep the wound covered so it does not catch on anything and tear worse.  Let me know if you have any redness or heat or any other sign of infection.  It may take a few weeks for this area to heal

## 2014-05-06 NOTE — Progress Notes (Signed)
[facility] [address] [place] [address] [phone]  Date:  05/06/2014   Name:  Patrick Rodriguez   DOB:  01-15-1925   MRN:  [id number]  PCP:  Robyn Haber, MD    Chief Complaint: Skin Tear   History of Present Illness:  Patrick Rodriguez is a 80 y.o. very pleasant male patient who presents with the following:  He is here today with a skin tear on his left forearm that he sustained on Friday- today is Monday. He tends to have delicate skin as he ages.  He caught the skin on a door casing and it tore his skin.   He washed it out and treated with neosporin. He does not notice any increased heat or pain.    His last tetanus was about 20 years ago.   Otherwise he feels well today and does not have any other concerns today   Patient Active Problem List   Diagnosis Date Noted  . (HFpEF) heart failure with preserved ejection fraction 12/14/2013  . Anemia 12/14/2013  . Acute post-hemorrhagic anemia 12/04/2013  . Cough 12/03/2013  . Carotid stenosis 11/06/2013  . Psoriasiform dermatitis 09/20/2012  . Edema 05/01/2012  . Syncope 12/29/2011  . CAD (coronary artery disease) 10/26/2011  . Asthma exacerbation 09/06/2011  . Acute bronchitis 09/06/2011  . HYPERLIPIDEMIA 03/14/2008  . HYPERTENSION 03/14/2008  . MYOCARDIAL INFARCTION 03/14/2008  . ALLERGIC RHINITIS 03/14/2008  . COPD (chronic obstructive pulmonary disease) with emphysema 03/14/2008  . ANGINA, HX OF 03/14/2008    Past Medical History  Diagnosis Date  . Hyperlipidemia   . Hypertension   . Coronary artery disease     a.  s/p stenting of the LAD in 2000;  b. Lexiscan Myoview (12/2013): No ischemia, EF 70%; normal study  . Obesity   . ED (erectile dysfunction)   . Allergic rhinitis   . Normal nuclear stress test 2008  . Syncope Jan 2013    felt to be vasovagal; had normal echo, carotids ok, 1st degree AV block with mention (but no tracing) of 2nd degree type I AV block while in [place]  . GERD  (gastroesophageal reflux disease)   . Normal echocardiogram Jan 2013    EF of 55% and no wall motion abnormalities  . Carotid artery stenosis     12/2011:  50-69% on the left and 1-49% on the right;  Carotid US (11/2013): Bilateral 40-59%; repeat 1 year  . Palpitations     Holter (11/2013): NSR, sinus brady, PVCs, rare blocked PAC, no sig arrhythmia    Past Surgical History  Procedure Laterality Date  . Cardiac catheterization  01/20/1999    EF 60%  . Tonsillectomy    . Appendectomy    . Doppler echocardiography  02/06/1998    EF 60%  . Cardiovascular stress test  04/19/2007    EF 74%  . Coronary stent placement  2000    LAD    History  Substance Use Topics  . Smoking status: Former Smoker -- 2.00 packs/day for 28 years    Types: Cigarettes    Quit date: 12/06/1968  . Smokeless tobacco: Never Used  . Alcohol Use: No    Family History  Problem Relation Age of Onset  . Aneurysm Mother   . Stomach cancer Father     Allergies  Allergen Reactions  . Hctz [Hydrochlorothiazide]     Has had hyponatremia    Medication list has been reviewed and updated.  Current Outpatient  Prescriptions on File Prior to Visit  Medication Sig Dispense Refill  . amLODipine (NORVASC) 5 MG tablet Take 5 mg twice a day  60 tablet  6  . Ascorbic Acid (VITAMIN C) 1000 MG tablet Take 1,000 mg by mouth daily.        Marland Kitchen aspirin 81 MG tablet Take 81 mg by mouth daily.      Marland Kitchen b complex vitamins tablet Take 1 tablet by mouth daily.        . beclomethasone (QVAR) 80 MCG/ACT inhaler Inhale 2 puffs into the lungs daily.  1 Inhaler  12  . doxazosin (CARDURA) 2 MG tablet TAKE 1 TABLET BY MOUTH ONCE DAILY AT BEDTIME  90 tablet  0  . magnesium gluconate (MAGONATE) 500 MG tablet Take 500 mg by mouth daily.        . Nattokinase 100 MG CAPS Take by mouth daily.        Marland Kitchen omeprazole (PRILOSEC) 40 MG capsule Take 40 mg by mouth daily.      . vitamin E 400 UNIT capsule Take 400 Units by mouth daily.        Marland Kitchen zinc  gluconate 50 MG tablet Take 50 mg by mouth daily.        Marland Kitchen atenolol (TENORMIN) 25 MG tablet Take 12.5 mg by mouth at bedtime.      . clindamycin (CLEOCIN) 300 MG capsule Take 1 capsule (300 mg total) by mouth 3 (three) times daily.  30 capsule  0  . mometasone-formoterol (DULERA) 100-5 MCG/ACT AERO Inhale 2 puffs into the lungs as needed.  8.8 g  11  . [DISCONTINUED] losartan-hydrochlorothiazide (HYZAAR) 100-25 MG per tablet Take 1 tablet by mouth daily.  30 tablet  5   No current facility-administered medications on file prior to visit.    Review of Systems:  As per HPI- otherwise negative.   Physical Examination: Filed Vitals:   05/06/14 1154  BP: 140/75  Pulse: 86  Temp: 98.7 F (37.1 C)  Resp: 18   Filed Vitals:   05/06/14 1154  Height: 5\' 7"  (1.702 m)  Weight: 174 lb (78.926 kg)   Body mass index is 27.25 kg/(m^2). Ideal Body Weight: Weight in (lb) to have BMI = 25: 159.3   GEN: WDWN, NAD, Non-toxic, Alert & Oriented x 3 HEENT: Atraumatic, Normocephalic.  Ears and Nose: No external deformity. EXTR: No clubbing/cyanosis/edema NEURO: Normal gait.  PSYCH: Normally interactive. Conversant. Not depressed or anxious appearing.  Calm demeanor.  Right forearm: there is a skin tear about 1in in length.  Cleaned with peroxide, unrolled part of the skin that had rolled under and secured with steri- strips and dressed  Assessment and Plan: Wound, open, arm, forearm - Plan: Td vaccine greater than or equal to 7yo preservative free IM  Update tetanus and dressed his wound. He will continue to keep the wound covered for protection, and will seek care for any sign of infection See patient instructions for more details.     Signed Lamar Blinks, MD

## 2014-05-08 ENCOUNTER — Ambulatory Visit (INDEPENDENT_AMBULATORY_CARE_PROVIDER_SITE_OTHER): Payer: Medicare Other | Admitting: Physician Assistant

## 2014-05-08 ENCOUNTER — Encounter: Payer: Self-pay | Admitting: Physician Assistant

## 2014-05-08 VITALS — BP 140/70 | HR 67 | Ht 67.0 in | Wt 176.0 lb

## 2014-05-08 DIAGNOSIS — I10 Essential (primary) hypertension: Secondary | ICD-10-CM

## 2014-05-08 DIAGNOSIS — I509 Heart failure, unspecified: Secondary | ICD-10-CM

## 2014-05-08 DIAGNOSIS — E785 Hyperlipidemia, unspecified: Secondary | ICD-10-CM

## 2014-05-08 DIAGNOSIS — I2510 Atherosclerotic heart disease of native coronary artery without angina pectoris: Secondary | ICD-10-CM

## 2014-05-08 DIAGNOSIS — I5030 Unspecified diastolic (congestive) heart failure: Secondary | ICD-10-CM

## 2014-05-08 DIAGNOSIS — I6529 Occlusion and stenosis of unspecified carotid artery: Secondary | ICD-10-CM

## 2014-05-08 NOTE — Patient Instructions (Signed)
LAB WORK TO BE ONE IN [place] OR September FOR FASTING CHOLESTEROL PANEL AND BMET  Your physician wants you to follow-up in: [place]. You will receive a reminder letter in the mail two months in advance. If you don't receive a letter, please call our office to schedule the follow-up appointment.

## 2014-05-08 NOTE — Progress Notes (Signed)
Cardiology Office Note   Date:  05/08/2014   ID:  Patrick Rodriguez, DOB 12-22-1924, MRN [id number]  PCP:  Robyn Haber, MD  Cardiologist:  Dr. Peter [place]      History of Present Illness: Patrick Rodriguez is a 80 y.o. male with a hx of CAD, s/p remote stenting of the LAD in 2000, HTN, HL, COPD. He had an episode of syncope in 2013 felt to be due to dehydration. HCTZ was stopped at that time. Stress nuclear study (5/08): Normal. Echocardiogram (12/2011): Normal EF, mild LVH, LAE, trace MR, trace TR. Carotid US (11/2013): bilat 40-59%.   I saw him 12/2 with complaints of near syncope and palpitations as well as dyspnea with exertion all in the setting of recent rectal bleeding. Hgb was noted to drop ~ 3 g (12.1 in 7/14 => 8.8 in 12/14). He was referred to GI and underwent Colo 11/15/13: mod diverticulosis; 6 polyps s/p polypectomy. Bx neg for malignant cells. Holter monitor was done: NSR, sinus brady, freq PVCs, some blocked PACs but no significant arrhythmia.  Lexiscan Myoview (12/2013): No ischemia, EF 70%.  He was ultimately treated with Lasix for volume overload thought to be 2/2 high output failure in the setting of anemia.  Last seen by Dr. Peter [place] in 12/2013.  He returns for follow up.    He is doing very well.  He is exercising several times a week.  The patient denies chest pain, shortness of breath, syncope, orthopnea, PND or significant pedal edema.  He is trying to lose weight.  He took himself off of Atenolol.  He is interested in stopping more medications if he can get his BP down.    Recent Labs: 07/05/2013: ALT 17; HDL Cholesterol by NMR 38.90*; LDL (calc) 109*; TSH 1.46  12/14/2013: Creatinine 1.3; Potassium 3.8; Pro B Natriuretic peptide (BNP) 285.0*  03/13/2014: Hemoglobin 12.1*   Wt Readings from Last 3 Encounters:  05/08/14 176 lb (79.833 kg)  05/06/14 174 lb (78.926 kg)  03/18/14 174 lb 3.2 oz (79.017 kg)     Past Medical History  Diagnosis Date  . Hyperlipidemia   .  Hypertension   . Coronary artery disease     a.  s/p stenting of the LAD in 2000;  b. Lexiscan Myoview (12/2013): No ischemia, EF 70%; normal study  . Obesity   . ED (erectile dysfunction)   . Allergic rhinitis   . Normal nuclear stress test 2008  . Syncope Jan 2013    felt to be vasovagal; had normal echo, carotids ok, 1st degree AV block with mention (but no tracing) of 2nd degree type I AV block while in [place]  . GERD (gastroesophageal reflux disease)   . Normal echocardiogram Jan 2013    EF of 55% and no wall motion abnormalities  . Carotid artery stenosis     12/2011:  50-69% on the left and 1-49% on the right;  Carotid US (11/2013): Bilateral 40-59%; repeat 1 year  . Palpitations     Holter (11/2013): NSR, sinus brady, PVCs, rare blocked PAC, no sig arrhythmia    Current Outpatient Prescriptions  Medication Sig Dispense Refill  . amLODipine (NORVASC) 5 MG tablet Take 5 mg twice a day  60 tablet  6  . Ascorbic Acid (VITAMIN C) 1000 MG tablet Take 1,000 mg by mouth daily.        Marland Kitchen aspirin 81 MG tablet Take 81 mg by mouth daily.      Marland Kitchen b complex vitamins tablet  Take 1 tablet by mouth daily.        Marland Kitchen doxazosin (CARDURA) 2 MG tablet TAKE 1 TABLET BY MOUTH ONCE DAILY AT BEDTIME  90 tablet  0  . magnesium gluconate (MAGONATE) 500 MG tablet Take 500 mg by mouth daily.        . Nattokinase 100 MG CAPS Take by mouth daily.        Marland Kitchen omeprazole (PRILOSEC) 40 MG capsule Take 40 mg by mouth daily.      . vitamin E 400 UNIT capsule Take 400 Units by mouth daily.        Marland Kitchen zinc gluconate 50 MG tablet Take 50 mg by mouth daily.        . [DISCONTINUED] losartan-hydrochlorothiazide (HYZAAR) 100-25 MG per tablet Take 1 tablet by mouth daily.  30 tablet  5   No current facility-administered medications for this visit.    Allergies:   Hctz   Social History:  The patient  reports that he quit smoking about 45 years ago. His smoking use included Cigarettes. He has a 56 pack-year smoking history. He has  never used smokeless tobacco. He reports that he does not drink alcohol or use illicit drugs.   Family History:  The patient's family history includes Aneurysm in his mother; Stomach cancer in his father.   ROS:  Please see the history of present illness.   Denies any further bleeding.   All other systems reviewed and negative.   PHYSICAL EXAM: VS:  BP 140/70  Pulse 67  Ht 5\' 7"  (1.702 m)  Wt 176 lb (79.833 kg)  BMI 27.56 kg/m2 Well nourished, well developed, in no acute distress HEENT: normal Neck: no JVD Cardiac:  normal S1, S2; RRR; no murmur Lungs:  clear to auscultation bilaterally, no wheezing, rhonchi or rales Abd: soft, nontender, no hepatomegaly Ext: trace bilateral ankle edema Skin: warm and dry Neuro:  CNs 2-12 intact, no focal abnormalities noted  EKG:  NSR, HR 67, normal axis, 1st degree AVB, no ST changes     ASSESSMENT AND PLAN:  1. CAD (coronary artery disease):  No angina.  Continue ASA.  He does not like to take statins. 2. (HFpEF) heart failure with preserved ejection fraction:  Doing well off of Lasix.  HFpEF was in the setting of high output failure from anemia.  Continue current Rx. 3. Carotid stenosis:  He is due for repeat US in 11/2014. 4. HYPERTENSION:  He will continue to monitor his BP.  I gave him the green light to decrease his Norvasc to 5 mg QD if his BP comes down and keep an eye on it.  He will let us know if he does this. 5. HYPERLIPIDEMIA:  He does not like to take statins.  He takes [employer] on occasion. 6. Disposition:  F/u with Dr. Peter [place] in 1 year.    Signed, Versie Starks, MHS 05/08/2014 10:06 AM    [facility] [place], [place],   [address] Phone: [phone]; Fax: [phone]

## 2014-05-13 ENCOUNTER — Other Ambulatory Visit: Payer: Self-pay | Admitting: Cardiology

## 2014-08-11 IMAGING — CR DG CHEST 2V
2 series · 2 of 2 positions shown · non-contrast
Comparison: August 11, 2011.

CLINICAL DATA: Shortness of breath.

EXAM:
CHEST  2 VIEW

[view not recorded (1 of 2)]
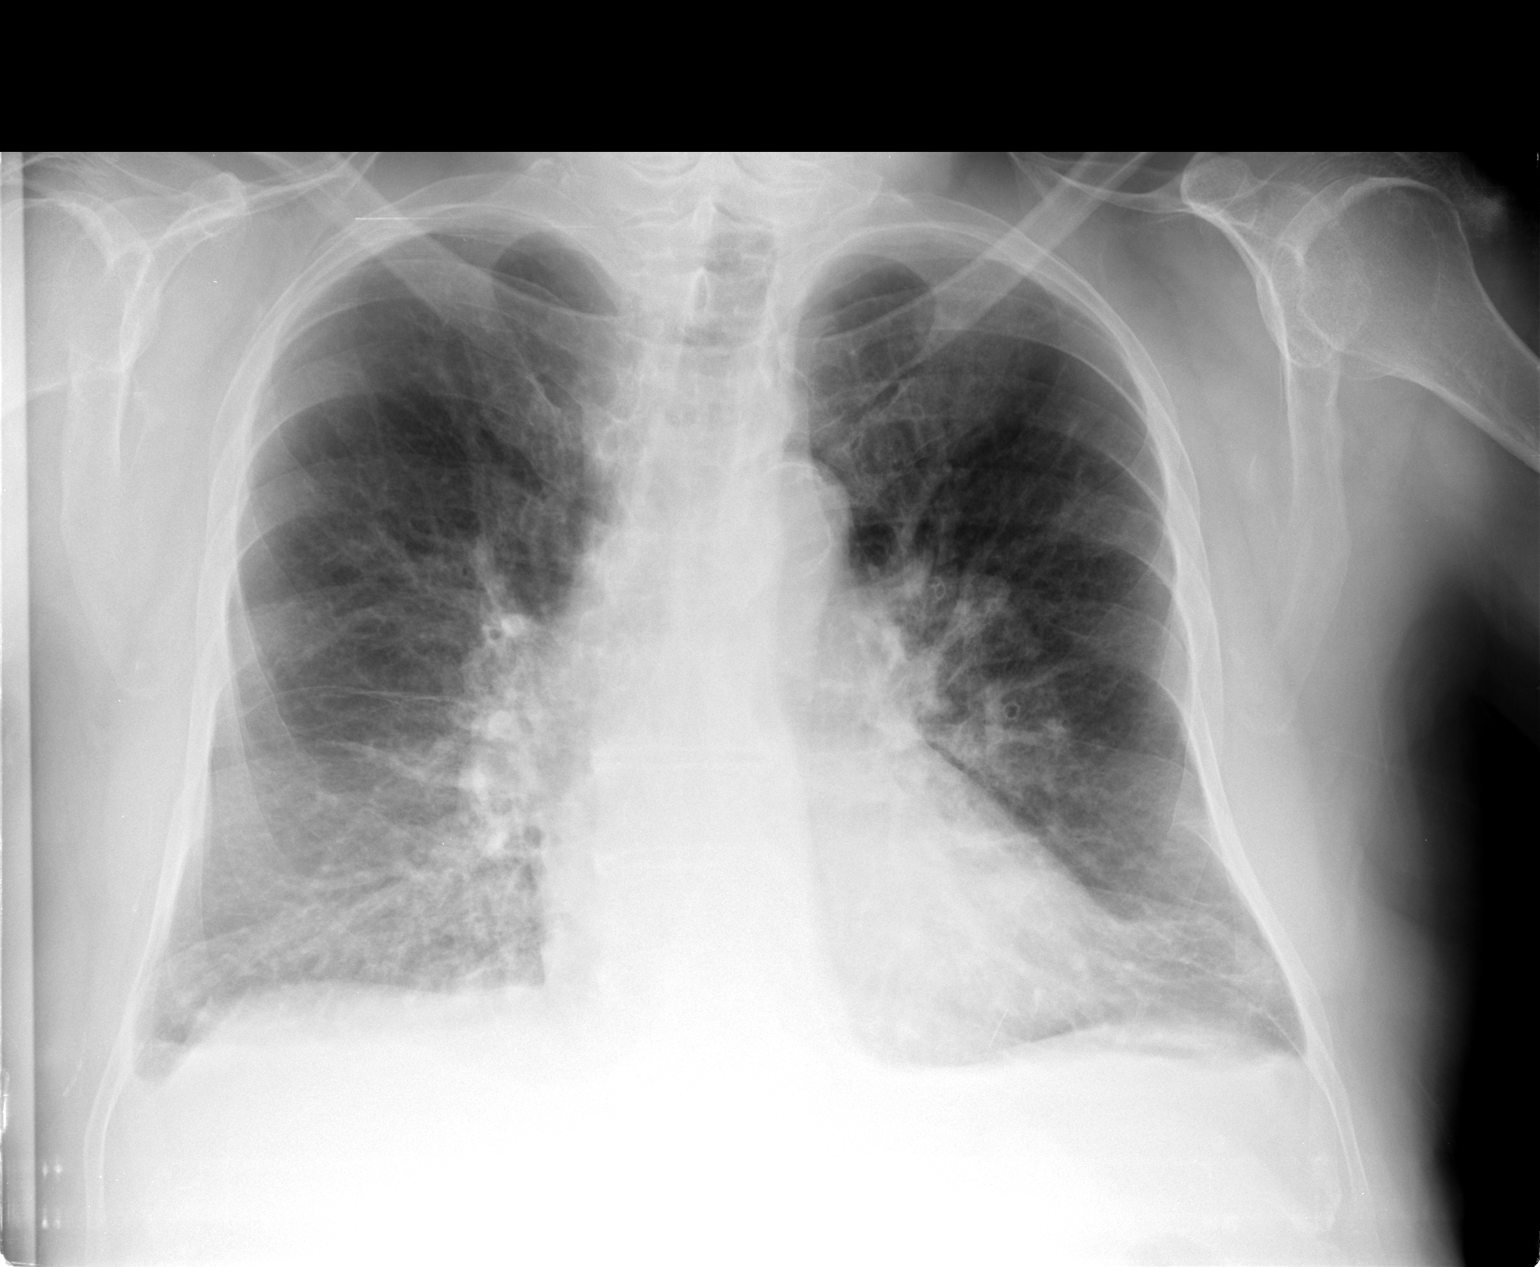

[view not recorded (2 of 2)]
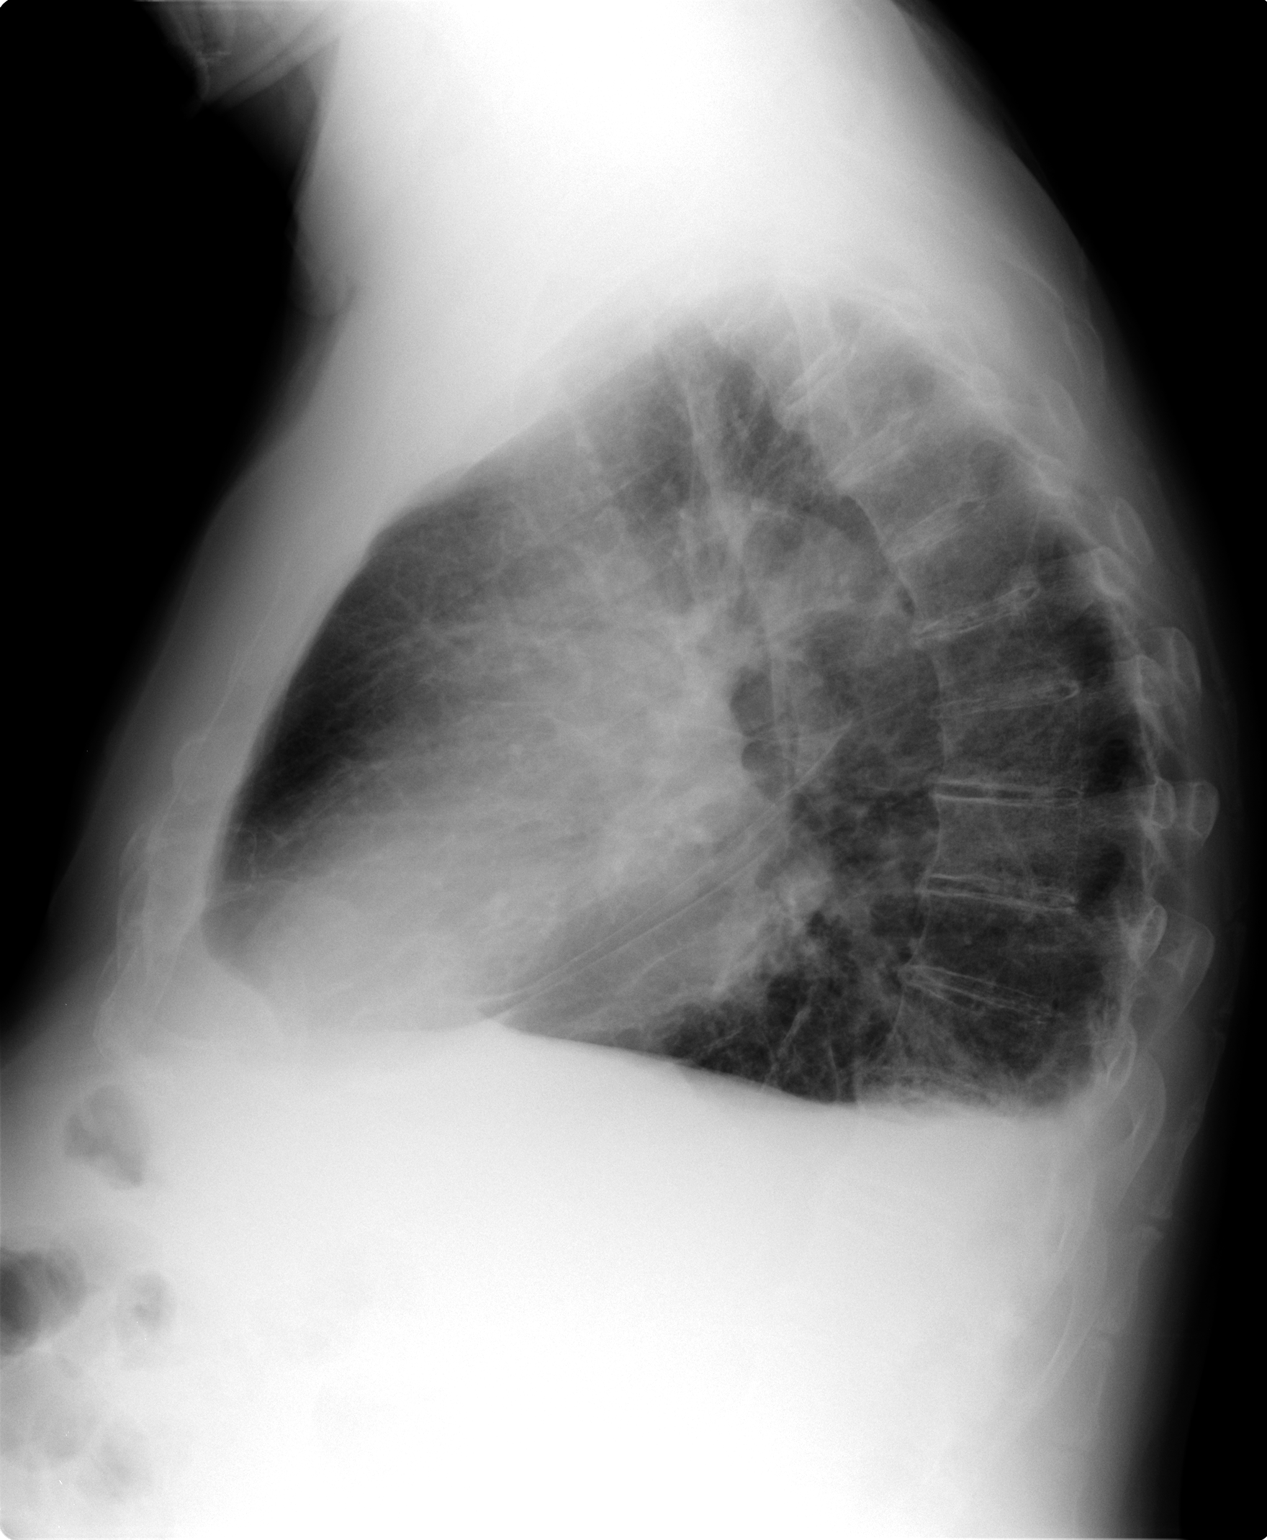

[2 of 2 positions shown; findings below may reference images not displayed]

FINDINGS: The heart size and mediastinal contours are within normal limits. No
pneumothorax is noted. Minimal bilateral pleural effusions are
noted. Mild bilateral basilar opacities are noted concerning for
edema with Kerley B-lines seen on the left. The visualized skeletal
structures are unremarkable.
IMPRESSION: Minimal bilateral pleural effusions. Mild bilateral basilar
opacities are noted concerning for edema.

## 2014-09-19 ENCOUNTER — Other Ambulatory Visit: Payer: Self-pay | Admitting: Cardiology

## 2014-09-20 NOTE — Telephone Encounter (Signed)
Nothing was typed/tmj 

## 2014-09-25 ENCOUNTER — Ambulatory Visit: Payer: Medicare Other | Admitting: Pulmonary Disease

## 2014-10-21 ENCOUNTER — Telehealth: Payer: Self-pay | Admitting: Pulmonary Disease

## 2014-10-21 NOTE — Telephone Encounter (Signed)
Please advise KC thanks 

## 2014-10-21 NOTE — Telephone Encounter (Signed)
Ok with me 

## 2014-10-21 NOTE — Telephone Encounter (Signed)
Please advise MW thanks 

## 2014-10-21 NOTE — Telephone Encounter (Signed)
Fine with me but be sure he brings all active meds with him to regroup

## 2014-10-22 NOTE — Telephone Encounter (Signed)
lmtcb x1 

## 2014-10-22 NOTE — Telephone Encounter (Signed)
Appt. Scheduled for 11/19.  Patient aware to bring medications.  Nothing else needing.  [facility]

## 2014-10-24 ENCOUNTER — Ambulatory Visit (INDEPENDENT_AMBULATORY_CARE_PROVIDER_SITE_OTHER): Payer: Medicare Other | Admitting: Internal Medicine

## 2014-10-24 ENCOUNTER — Encounter: Payer: Self-pay | Admitting: Internal Medicine

## 2014-10-24 VITALS — BP 118/70 | HR 78 | Ht 69.0 in | Wt 181.6 lb

## 2014-10-24 DIAGNOSIS — J449 Chronic obstructive pulmonary disease, unspecified: Secondary | ICD-10-CM

## 2014-10-24 NOTE — Progress Notes (Signed)
Subjective:     Patient ID: Patrick Rodriguez, male   DOB: 1925/06/01    MRN: [id number]    Brief patient profile:  4 yowm quit smoking 1970 with documented GOLD II copd on prn symbiocrt 160 2bid due to severe hoarseness    12/03/13 ov/Patrick Rodriguez  Abruptly more sob with exertion since acute GIB x one month prior to OV assoc with fatigue  And more cough x one - two weeks assoc with noct wheeze so started symbicort 160 2 bid but more hoarse since. Sob with more than slow adls, some better p restarted symbicort, some better with saba. rec Add pepcid 20 mg ac at bedtime as long as having night time symptoms (available over the counter) Stop symbicort  Start dulera 100 Take 2 puffs first thing in am and then another 2 puffs about 12 hours later.  Work on [employer] and spatial [employer] Double lasix rx until f/u with cards as cxr  wet    10/24/2014 f/u ov/Patrick Rodriguez re: copd gold II and gerd /cough  Chief Complaint  Patient presents with  . Follow-up    cough clear SOB   Not limited by breathing from desired activities  Including treadmill 3 x weekly  Sleep ok unless obvious gerd once or twice weekly > cough  More than sob   No obvious day to day or daytime variabilty or assoc excess/ purulent sputum or cp or chest tightness, subjective wheeze or overt sinus  symptoms. No unusual exp hx or h/o childhood pna/ asthma or knowledge of premature birth.   Also denies any obvious fluctuation of symptoms with weather or environmental changes or other aggravating or alleviating factors except as outlined above   Current Medications, Allergies, Complete Past Medical History, Past Surgical History, Family History, and Social History were reviewed in [employer] record.  ROS  The following are not active complaints unless bolded sore throat, dysphagia, dental problems, itching, sneezing,  nasal congestion or excess/ purulent secretions, ear ache,   fever, chills, sweats, unintended wt loss,  pleuritic or exertional cp, hemoptysis,  orthopnea pnd or leg swelling, presyncope, palpitations, heartburn, abdominal pain, anorexia, nausea, vomiting, diarrhea  or change in bowel or urinary habits, change in stools or urine, dysuria,hematuria,  rash, arthralgias, visual complaints, headache, numbness weakness or ataxia or problems with walking or coordination,  change in mood/affect or memory.                 Objective:   Physical Exam    hoarse amb  wm nad looks younger than stated age   10/24/2014      170  Wt Readings from Last 3 Encounters:  12/03/13 190 lb (86.183 kg)  11/21/13 185 lb (83.915 kg)  11/15/13 178 lb (80.74 kg)     HEENT mild turbinate edema.  Oropharynx no thrush or excess pnd or cobblestoning.  No JVD or cervical adenopathy. Mild accessory muscle hypertrophy. Trachea midline, nl thryroid. Chest was hyperinflated by percussion with diminished breath sounds and moderate increased exp time without wheeze. Hoover sign positive at mid inspiration. Regular rate and rhythm without murmur gallop or rub or increase P2 or edema.  Abd: no hsm, nl excursion. Ext warm without cyanosis or clubbing.     CXR  12/03/2013 : Minimal bilateral pleural effusions. Mild bilateral basilar opacities are noted concerning for edema.    Assessment:

## 2014-10-24 NOTE — Patient Instructions (Addendum)
Bed blocks would be a good idea  Prilosec Take 30-60 min before first meal of the day   Pepcid 20 mg one at bedtime  dulera 100 1-2 pffs every 12 hours if needed for breathing/ coughing   GERD (REFLUX)  is an extremely common cause of respiratory symptoms just like yours , many times with no obvious heartburn at all.    It can be treated with medication, but also with lifestyle changes including avoidance of late meals, excessive alcohol, smoking cessation, and avoid fatty foods, chocolate, peppermint, colas, red wine, and acidic juices such as orange juice.  NO MINT OR MENTHOL PRODUCTS SO NO COUGH DROPS  USE SUGARLESS CANDY INSTEAD (Jolley ranchers or Stover's or Life Savers) or even ice chips will also do - the key is to swallow to prevent all throat clearing. NO OIL BASED VITAMINS - use powdered substitutes.  Please schedule a follow up visit in 3 months but call sooner if needed pfts  Late add cxr needed on return

## 2014-10-25 NOTE — Assessment & Plan Note (Addendum)
Patrick Rodriguez 2007:  FEV1 1.72 (64%), ratio 52  DDX of  difficult airways management all start with A and  include Adherence, Ace Inhibitors, Acid Reflux, Active Sinus Disease, Alpha 1 Antitripsin deficiency, Anxiety masquerading as Airways dz,  ABPA,  allergy(esp in young), Aspiration (esp in elderly), Adverse effects of DPI,  Active smokers, plus two Bs  = Bronchiectasis and Beta blocker use..and one C= CHF  Adherence is always the initial "prime suspect" and is a multilayered concern that requires a "trust but verify" approach in every patient - starting with knowing how to use medications, especially inhalers, correctly, keeping up with refills and understanding the fundamental difference between maintenance and prns vs those medications only taken for a very short course and then stopped and not refilled. -  The proper method of use, as well as anticipated side effects, of a metered-dose inhaler are discussed and demonstrated to the patient. Improved effectiveness after extensive coaching during this visit to a level of approximately  75% so ok to continue dulera 100 prn   ? Acid (or non-acid) GERD > always difficult to exclude as up to 75% of pts in some series report no assoc GI/ Heartburn symptoms> rec max (24h)  acid suppression and diet restrictions/ reviewed and instructions given in writing.   ? chf >cards f/u planned    Each maintenance medication was reviewed in detail including most importantly the difference between maintenance and as needed and under what circumstances the prns are to be used.  Please see instructions for details which were reviewed in writing and the patient given a copy.

## 2014-10-30 ENCOUNTER — Telehealth: Payer: Self-pay | Admitting: Internal Medicine

## 2014-10-30 ENCOUNTER — Ambulatory Visit (INDEPENDENT_AMBULATORY_CARE_PROVIDER_SITE_OTHER): Payer: Medicare Other | Admitting: Emergency Medicine

## 2014-10-30 ENCOUNTER — Ambulatory Visit (INDEPENDENT_AMBULATORY_CARE_PROVIDER_SITE_OTHER): Payer: Medicare Other

## 2014-10-30 VITALS — BP 144/50 | HR 75 | Temp 98.2°F | Resp 16 | Ht 68.0 in | Wt 178.4 lb

## 2014-10-30 DIAGNOSIS — R05 Cough: Secondary | ICD-10-CM

## 2014-10-30 DIAGNOSIS — J189 Pneumonia, unspecified organism: Secondary | ICD-10-CM

## 2014-10-30 DIAGNOSIS — R059 Cough, unspecified: Secondary | ICD-10-CM

## 2014-10-30 DIAGNOSIS — J029 Acute pharyngitis, unspecified: Secondary | ICD-10-CM

## 2014-10-30 DIAGNOSIS — D72829 Elevated white blood cell count, unspecified: Secondary | ICD-10-CM

## 2014-10-30 LAB — POCT CBC
Granulocyte percent: 78.8 %G (ref 37–80)
HCT, POC: 37.3 % — AB (ref 43.5–53.7)
Hemoglobin: 12.5 g/dL — AB (ref 14.1–18.1)
Lymph, poc: 3.3 (ref 0.6–3.4)
MCH, POC: 31.3 pg — AB (ref 27–31.2)
MCHC: 33.5 g/dL (ref 31.8–35.4)
MCV: 93.2 fL (ref 80–97)
MID (cbc): 1.1 — AB (ref 0–0.9)
MPV: 7.4 fL (ref 0–99.8)
POC Granulocyte: 16.4 — AB (ref 2–6.9)
POC LYMPH %: 16 % (ref 10–50)
POC MID %: 5.2 %M (ref 0–12)
Platelet Count, POC: 226 10*3/uL (ref 142–424)
RBC: 4 M/uL — AB (ref 4.69–6.13)
RDW, POC: 16 %
WBC: 20.8 10*3/uL — AB (ref 4.6–10.2)

## 2014-10-30 LAB — POCT RAPID STREP A (OFFICE): Rapid Strep A Screen: NEGATIVE

## 2014-10-30 MED ORDER — CEFTRIAXONE SODIUM 1 G IJ SOLR
1.0000 g | Freq: Once | INTRAMUSCULAR | Status: AC
  Administered 2014-10-30: 1 g via INTRAMUSCULAR

## 2014-10-30 MED ORDER — LEVOFLOXACIN 500 MG PO TABS: 500.0000 mg | ORAL_TABLET | Freq: Every day | ORAL | Status: AC

## 2014-10-30 NOTE — Telephone Encounter (Signed)
Called pt back, he was c/o sore throat, prod cough with pale yellow mucus X2-3 days, sinus congestion.  Pt is in waiting room at Urgent care, was wanting to see if MW could see him today instead.  I advised pt that we close at 12 today but I could relay results to MW to see what he recommends for him, or if we could get him in on Friday.  Pt declined, stated that he believed he just needed an antibiotic and could get that from urgent care.  I advised pt to contact us if his symptoms do not improve.  Nothing further needed at this time.

## 2014-10-30 NOTE — Progress Notes (Addendum)
Subjective:    Patient ID: Patrick Rodriguez, male    DOB: August 05, 1925, 80 y.o.   MRN: [id number] This chart was scribed for Arlyss Queen, MD by Marti Sleigh, Medical Scribe. This patient was seen in Room 9 and the patient's care was started at 12:10 PM.  Chief Complaint  Patient presents with  . Cough    X 4 DAYS     HPI HPI Comments: Patrick Rodriguez is a 80 y.o. male with a hx of pneumonia, COPD, bronchial spasm, MI, HTN, HLD and CAD who presents to [facility] complaining of a productive cough with yellow purulent that started four days ago. Pt also endorses sore throat, sinus pressure, and rhinorrhea. Pt denies fever, chills, nausea, vomiting or diarrhea.  Pt was a regular smoker, 2ppd, 56 pack years. Pt does not smoke currently. Pt does not drink.    Review of Systems  Constitutional: Negative for fever and chills.  HENT: Positive for congestion, ear pain, postnasal drip, rhinorrhea, sinus pressure and sore throat.        Right-sided cervical adenopathy.  Gastrointestinal: Negative for nausea and vomiting.       Objective:   Physical Exam  Constitutional: He is oriented to person, place, and time. He appears well-developed and well-nourished.  HENT:  Head: Normocephalic and atraumatic.  Right Ear: Tympanic membrane and ear canal normal.  Left Ear: Tympanic membrane and ear canal normal.  Nose: Nose normal.  Mouth/Throat: Posterior oropharyngeal edema present.  Bilateral hearing aids.  Eyes: Pupils are equal, round, and reactive to light.  Neck: Neck supple.  Cardiovascular: Normal rate and regular rhythm.   Pulmonary/Chest: Effort normal and breath sounds normal. No respiratory distress. He has no wheezes.  Decreased breath sounds in the bases, but otherwise clear.  Lymphadenopathy:    He has no cervical adenopathy.  Neurological: He is alert and oriented to person, place, and time.  Skin: Skin is warm and dry.  Psychiatric: He has a normal mood and affect. His behavior is normal.    Nursing note and vitals reviewed.  Results for orders placed or performed in visit on 10/30/14  POCT CBC  Result Value Ref Range   WBC 20.8 (A) 4.6 - 10.2 K/uL   Lymph, poc 3.3 0.6 - 3.4   POC LYMPH PERCENT 16.0 10 - 50 %L   MID (cbc) 1.1 (A) 0 - 0.9   POC MID % 5.2 0 - 12 %M   POC Granulocyte 16.4 (A) 2 - 6.9   Granulocyte percent 78.8 37 - 80 %G   RBC 4.00 (A) 4.69 - 6.13 M/uL   Hemoglobin 12.5 (A) 14.1 - 18.1 g/dL   HCT, POC 37.3 (A) 43.5 - 53.7 %   MCV 93.2 80 - 97 fL   MCH, POC 31.3 (A) 27 - 31.2 pg   MCHC 33.5 31.8 - 35.4 g/dL   RDW, POC 16.0 %   Platelet Count, POC 226 142 - 424 K/uL   MPV 7.4 0 - 99.8 fL  POCT rapid strep A  Result Value Ref Range   Rapid Strep A Screen Negative Negative  [facility] reading (PRIMARY) by  Dr. Everlene Farrier no pneumonic infiltrates are seen there is a scoliotic curve and scarring related to COPD.     Assessment & Plan:  } I did not see definite pneumonia on x-ray however his white count is 21,000 and he has not been on steroids. He will be given 1 g Rocephin follow-up in 24 hours. He will  be placed on Levaquin 500 one a day.I personally performed the services described in this documentation, which was scribed in my presence. The recorded information has been reviewed and is accurate.

## 2014-10-30 NOTE — Progress Notes (Deleted)
   Subjective:    Patient ID: Patrick Rodriguez, male    DOB: 23-Dec-1924, 80 y.o.   MRN: [id number]  HPI    Review of Systems     Objective:   Physical Exam        Assessment & Plan:

## 2014-10-30 NOTE — Telephone Encounter (Signed)
lmtcb X1 for patient 

## 2014-10-30 NOTE — Patient Instructions (Signed)

## 2014-10-30 NOTE — Telephone Encounter (Signed)
Pt returning call.Patrick Rodriguez ° °

## 2014-11-01 ENCOUNTER — Telehealth: Payer: Self-pay

## 2014-11-01 NOTE — Telephone Encounter (Signed)
Called pt at Dr Perfecto Kingdom req to check status. Pt reported that his throat is still very sore and still coughing, but he feels quite a bit better today. He does plan to RTC in the morning to see Dr Everlene Farrier for re-check. Dr Everlene Farrier, [place]

## 2014-11-02 ENCOUNTER — Ambulatory Visit (INDEPENDENT_AMBULATORY_CARE_PROVIDER_SITE_OTHER): Payer: Medicare Other | Admitting: Emergency Medicine

## 2014-11-02 VITALS — BP 128/50 | HR 78 | Temp 97.8°F | Resp 18 | Ht 67.5 in | Wt 175.2 lb

## 2014-11-02 DIAGNOSIS — J069 Acute upper respiratory infection, unspecified: Secondary | ICD-10-CM

## 2014-11-02 DIAGNOSIS — D72829 Elevated white blood cell count, unspecified: Secondary | ICD-10-CM

## 2014-11-02 DIAGNOSIS — J189 Pneumonia, unspecified organism: Secondary | ICD-10-CM

## 2014-11-02 LAB — POCT CBC
GRANULOCYTE PERCENT: 73.4 % (ref 37–80)
HCT, POC: 35.2 % — AB (ref 43.5–53.7)
Hemoglobin: 11.7 g/dL — AB (ref 14.1–18.1)
Lymph, poc: 2.1 (ref 0.6–3.4)
MCH: 30.8 pg (ref 27–31.2)
MCHC: 33.2 g/dL (ref 31.8–35.4)
MCV: 92.7 fL (ref 80–97)
MID (cbc): 1 — AB (ref 0–0.9)
MPV: 7.3 fL (ref 0–99.8)
PLATELET COUNT, POC: 316 10*3/uL (ref 142–424)
POC Granulocyte: 8.7 — AB (ref 2–6.9)
POC LYMPH %: 18 % (ref 10–50)
POC MID %: 8.6 % (ref 0–12)
RBC: 3.79 M/uL — AB (ref 4.69–6.13)
RDW, POC: 15.5 %
WBC: 11.9 10*3/uL — AB (ref 4.6–10.2)

## 2014-11-02 NOTE — Progress Notes (Addendum)
Subjective:  This chart was scribed for Patrick Jordan, MD by [name], ED Scribe at [facility].The patient was seen in exam room 05 and the patient's care was started at 10:50 AM.   Patient ID: Patrick Rodriguez, male    DOB: 1925/11/07, 80 y.o.   MRN: [id number]  HPI HPI Comments: FIELDS OROS is a 80 y.o. male who presents to [facility] is here for a follow up. Pt was seen three days ago with suspected pneumonia and treated with Levaquin because of a negative chest x-ray. Pt states he is feeling a little better. He notes his CP and sore throat is better but his voice is still raspy. He also has a HA which he believes is due to sinus pressure.  Patient Active Problem List   Diagnosis Date Noted  . (HFpEF) heart failure with preserved ejection fraction 12/14/2013  . Anemia 12/14/2013  . Acute post-hemorrhagic anemia 12/04/2013  . Cough 12/03/2013  . Carotid stenosis 11/06/2013  . Psoriasiform dermatitis 09/20/2012  . Edema 05/01/2012  . Syncope 12/29/2011  . CAD (coronary artery disease) 10/26/2011  . Asthma exacerbation 09/06/2011  . Acute bronchitis 09/06/2011  . HYPERLIPIDEMIA 03/14/2008  . HYPERTENSION 03/14/2008  . MYOCARDIAL INFARCTION 03/14/2008  . ALLERGIC RHINITIS 03/14/2008  . COPD GOLD II 03/14/2008  . ANGINA, HX OF 03/14/2008   Past Medical History  Diagnosis Date  . Hyperlipidemia   . Hypertension   . Coronary artery disease     a.  s/p stenting of the LAD in 2000;  b. Lexiscan Myoview (12/2013): No ischemia, EF 70%; normal study  . Obesity   . ED (erectile dysfunction)   . Allergic rhinitis   . Normal nuclear stress test 2008  . Syncope Jan 2013    felt to be vasovagal; had normal echo, carotids ok, 1st degree AV block with mention (but no tracing) of 2nd degree type I AV block while in [place]  . GERD (gastroesophageal reflux disease)   . Normal echocardiogram Jan 2013    EF of 55% and no wall motion abnormalities  . Carotid artery stenosis    12/2011:  50-69% on the left and 1-49% on the right;  Carotid US (11/2013): Bilateral 40-59%; repeat 1 year  . Palpitations     Holter (11/2013): NSR, sinus brady, PVCs, rare blocked PAC, no sig arrhythmia   Past Surgical History  Procedure Laterality Date  . Cardiac catheterization  01/20/1999    EF 60%  . Tonsillectomy    . Appendectomy    . Doppler echocardiography  02/06/1998    EF 60%  . Cardiovascular stress test  04/19/2007    EF 74%  . Coronary stent placement  2000    LAD   Allergies  Allergen Reactions  . Hctz [Hydrochlorothiazide]     Has had hyponatremia   Prior to Admission medications   Medication Sig Start Date End Date Taking? Authorizing Provider  amLODipine (NORVASC) 5 MG tablet TAKE ONE TABLET BY MOUTH TWICE DAILY 09/20/14   Peter M Martinique, MD  Ascorbic Acid (VITAMIN C) 1000 MG tablet Take 1,000 mg by mouth daily.      Historical Provider, MD  aspirin 81 MG tablet Take 81 mg by mouth daily.    Historical Provider, MD  b complex vitamins tablet Take 1 tablet by mouth daily.      Historical Provider, MD  doxazosin (CARDURA) 2 MG tablet TAKE 1 TABLET BY MOUTH ONCE DAILY AT BEDTIME  Peter M Martinique, MD  levofloxacin (LEVAQUIN) 500 MG tablet Take 1 tablet (500 mg total) by mouth daily. 10/30/14 11/09/14  Darlyne Russian, MD  magnesium gluconate (MAGONATE) 500 MG tablet Take 500 mg by mouth daily.      Historical Provider, MD  Nattokinase 100 MG CAPS Take by mouth daily.      Historical Provider, MD  omeprazole (PRILOSEC) 40 MG capsule Take 40 mg by mouth daily.    Historical Provider, MD  vitamin E 400 UNIT capsule Take 400 Units by mouth daily.      Historical Provider, MD  zinc gluconate 50 MG tablet Take 50 mg by mouth daily.      Historical Provider, MD   History   Social History  . Marital Status: Married    Spouse Name: N/A    Number of Children: Y  . Years of Education: N/A   Occupational History  . traveling paster    Social History Main Topics  .  Smoking status: Former Smoker -- 2.00 packs/day for 28 years    Types: Cigarettes    Quit date: 12/06/1968  . Smokeless tobacco: Never Used  . Alcohol Use: No  . Drug Use: No  . Sexual Activity: Not on file   Other Topics Concern  . Not on file   Social History Narrative   Review of Systems  HENT: Positive for sinus pressure and voice change. Negative for sore throat.   Cardiovascular: Negative for chest pain.  Neurological: Positive for headaches.      Objective:  BP 128/50 mmHg  Pulse 78  Temp(Src) 97.8 F (36.6 C) (Oral)  Resp 18  Ht 5' 7.5" (1.715 m)  Wt 175 lb 3.2 oz (79.47 kg)  BMI 27.02 kg/m2  SpO2 96%  Physical Exam  Constitutional: He is oriented to person, place, and time. He appears well-developed and well-nourished.  HENT:  Head: Normocephalic and atraumatic.  Right Ear: External ear normal.  Left Ear: External ear normal.  Nose: Nose normal.  Mouth/Throat: Oropharynx is clear and moist.  Eyes: Conjunctivae and EOM are normal. Pupils are equal, round, and reactive to light.  Neck: Normal range of motion. Neck supple.  Cardiovascular: Normal rate, regular rhythm and normal heart sounds.   Pulmonary/Chest: Effort normal.  Mild decreased breath sounds in the bases.  No rales, no dullness.  Abdominal: Soft. Bowel sounds are normal.  Musculoskeletal: Normal range of motion.  Neurological: He is alert and oriented to person, place, and time.  Skin: Skin is warm and dry.  Psychiatric: He has a normal mood and affect. His behavior is normal.  Nursing note and vitals reviewed.  Results for orders placed or performed in visit on 11/02/14  POCT CBC  Result Value Ref Range   WBC 11.9 (A) 4.6 - 10.2 K/uL   Lymph, poc 2.1 0.6 - 3.4   POC LYMPH PERCENT 18.0 10 - 50 %L   MID (cbc) 1.0 (A) 0 - 0.9   POC MID % 8.6 0 - 12 %M   POC Granulocyte 8.7 (A) 2 - 6.9   Granulocyte percent 73.4 37 - 80 %G   RBC 3.79 (A) 4.69 - 6.13 M/uL   Hemoglobin 11.7 (A) 14.1 - 18.1  g/dL   HCT, POC 35.2 (A) 43.5 - 53.7 %   MCV 92.7 80 - 97 fL   MCH, POC 30.8 27 - 31.2 pg   MCHC 33.2 31.8 - 35.4 g/dL   RDW, POC 15.5 %   Platelet  Count, POC 316 142 - 424 K/uL   MPV 7.3 0 - 99.8 fL       Assessment & Plan:  Patient looks better and feels better. He continues to have sinus congestion but his chest symptoms are much improved. His white blood cell count is significantly better. Advised follow-up after he finishes all of his medications. No other changes I personally performed the services described in this documentation, which was scribed in my presence. The recorded information has been reviewed and is accurate.Marland Kitchen

## 2014-11-04 NOTE — Progress Notes (Signed)
Chart and ov notes reviewed and agree with a/p as outlined  

## 2014-11-06 ENCOUNTER — Telehealth: Payer: Self-pay

## 2014-11-06 NOTE — Telephone Encounter (Signed)
Called to check status of flu vaccine. LMOM to CB

## 2014-11-11 ENCOUNTER — Ambulatory Visit (INDEPENDENT_AMBULATORY_CARE_PROVIDER_SITE_OTHER): Payer: Medicare Other | Admitting: Emergency Medicine

## 2014-11-11 ENCOUNTER — Encounter: Payer: Self-pay | Admitting: Internal Medicine

## 2014-11-11 ENCOUNTER — Ambulatory Visit (INDEPENDENT_AMBULATORY_CARE_PROVIDER_SITE_OTHER): Payer: Medicare Other | Admitting: Internal Medicine

## 2014-11-11 VITALS — BP 130/58 | HR 78 | Temp 97.7°F | Resp 16 | Ht 67.5 in | Wt 176.0 lb

## 2014-11-11 VITALS — BP 128/62 | HR 82 | Ht 68.0 in | Wt 180.0 lb

## 2014-11-11 DIAGNOSIS — I459 Conduction disorder, unspecified: Secondary | ICD-10-CM

## 2014-11-11 DIAGNOSIS — I10 Essential (primary) hypertension: Secondary | ICD-10-CM

## 2014-11-11 DIAGNOSIS — D72829 Elevated white blood cell count, unspecified: Secondary | ICD-10-CM

## 2014-11-11 DIAGNOSIS — R1310 Dysphagia, unspecified: Secondary | ICD-10-CM

## 2014-11-11 DIAGNOSIS — R1314 Dysphagia, pharyngoesophageal phase: Secondary | ICD-10-CM

## 2014-11-11 DIAGNOSIS — J449 Chronic obstructive pulmonary disease, unspecified: Secondary | ICD-10-CM

## 2014-11-11 DIAGNOSIS — R1319 Other dysphagia: Secondary | ICD-10-CM | POA: Insufficient documentation

## 2014-11-11 LAB — POCT CBC
GRANULOCYTE PERCENT: 68.9 % (ref 37–80)
HEMATOCRIT: 36.4 % — AB (ref 43.5–53.7)
Hemoglobin: 11.9 g/dL — AB (ref 14.1–18.1)
LYMPH, POC: 2.2 (ref 0.6–3.4)
MCH, POC: 30.9 pg (ref 27–31.2)
MCHC: 32.8 g/dL (ref 31.8–35.4)
MCV: 94.1 fL (ref 80–97)
MID (cbc): 0.8 (ref 0–0.9)
MPV: 6.3 fL (ref 0–99.8)
POC Granulocyte: 6.8 (ref 2–6.9)
POC LYMPH %: 22.5 % (ref 10–50)
POC MID %: 8.6 %M (ref 0–12)
Platelet Count, POC: 396 10*3/uL (ref 142–424)
RBC: 3.86 M/uL — AB (ref 4.69–6.13)
RDW, POC: 16.2 %
WBC: 9.8 10*3/uL (ref 4.6–10.2)

## 2014-11-11 NOTE — Progress Notes (Signed)
   Subjective:    Patient ID: Patrick Rodriguez, male    DOB: [birth year], 80 y.o.   MRN: [id number] This chart was scribed for Arlyss Queen, MD by Marti Sleigh, Medical Scribe. This patient was seen in Room 2 and the patient's care was started at 10:44 AM.  Chief Complaint  Patient presents with  . Follow-up    HPI HPI Comments: Patrick Rodriguez is a 80 y.o. male with a hx of HTN, MI, CAD, and COPD who presents to [facility] reporting for a follow up due to a diagnosis of community acquired pneuonia. Pt states he is feeling much better. Pt endorses continued wooziness in head, sinus congestion, and GERD symptoms. Pt states his sore throat symptoms have improved. Pt's cardiologist is Redmond Pulling. Pt states that he has noticed his heart bas been skipping a beat from time to time.  Pt also states he has developed a bruise and itchy patch where he received his abx shot.     Review of Systems  Constitutional: Negative for fever and chills.  HENT: Positive for congestion. Negative for sore throat.   Cardiovascular:       Heart skipping a beat.        Objective:   Physical Exam  Constitutional: He is oriented to person, place, and time. He appears well-developed and well-nourished.  HENT:  Head: Normocephalic and atraumatic.  Nasal congestion. Fluid behind right TM.   Eyes: Pupils are equal, round, and reactive to light.  Neck: Neck supple.  Cardiovascular: Normal rate and regular rhythm.   Pt has occasional skipped beats.   Pulmonary/Chest: Effort normal and breath sounds normal. No respiratory distress. He has no wheezes.  Neurological: He is alert and oriented to person, place, and time.  Skin: Skin is warm and dry.  Psychiatric: He has a normal mood and affect. His behavior is normal.  Nursing note and vitals reviewed. EKG is read as old anterior infarct there is no change in the basic EKG from previous he does have 1 PVC seen. Results for orders placed or performed in visit on 11/11/14  POCT  CBC  Result Value Ref Range   WBC 9.8 4.6 - 10.2 K/uL   Lymph, poc 2.2 0.6 - 3.4   POC LYMPH PERCENT 22.5 10 - 50 %L   MID (cbc) 0.8 0 - 0.9   POC MID % 8.6 0 - 12 %M   POC Granulocyte 6.8 2 - 6.9   Granulocyte percent 68.9 37 - 80 %G   RBC 3.86 (A) 4.69 - 6.13 M/uL   Hemoglobin 11.9 (A) 14.1 - 18.1 g/dL   HCT, POC 36.4 (A) 43.5 - 53.7 %   MCV 94.1 80 - 97 fL   MCH, POC 30.9 27 - 31.2 pg   MCHC 32.8 31.8 - 35.4 g/dL   RDW, POC 16.2 %   Platelet Count, POC 396 142 - 424 K/uL   MPV 6.3 0 - 99.8 fL  EKG   NSR no change from previous one PVC seen    Assessment & Plan:   White count back to normal. He has a right ear effusion. Will treat with Flonase. Follow up with Dr. Melvyn Novas this afternoon.I personally performed the services described in this documentation, which was scribed in my presence. The recorded information has been reviewed and is accurate.

## 2014-11-11 NOTE — Progress Notes (Deleted)
   Subjective:    Patient ID: Patrick Rodriguez, male    DOB: June 30, 1925, 80 y.o.   MRN: [id number]  HPI    Review of Systems     Objective:   Physical Exam        Assessment & Plan:

## 2014-11-11 NOTE — Patient Instructions (Signed)
You may want to double your cardura(doxasoin )  And see what effect it has on your swallowing  GERD (REFLUX)  is an extremely common cause of respiratory symptoms just like yours , many times with no obvious heartburn at all.    It can be treated with medication, but also with lifestyle changes including avoidance of late meals, excessive alcohol, smoking cessation, and avoid fatty foods, chocolate, peppermint, colas, red wine, and acidic juices such as orange juice.  NO MINT OR MENTHOL PRODUCTS SO NO COUGH DROPS  USE SUGARLESS CANDY INSTEAD (Jolley ranchers or Stover's or Life Savers) or even ice chips will also do - the key is to swallow to prevent all throat clearing. NO OIL BASED VITAMINS - use powdered substitutes.   Please see patient coordinator before you leave today  to schedule swallowing eval   Keep previous appt for followup

## 2014-11-11 NOTE — Progress Notes (Signed)
Subjective:     Patient ID: Patrick Rodriguez, male   DOB: 12/28/1924    MRN: [id number]    Brief patient profile:  7 yowm quit smoking 1970 with documented GOLD II copd on prn symbiocrt 160 2bid due to severe hoarseness    12/03/13 ov/Cathey Fredenburg  Abruptly more sob with exertion since acute GIB x one month prior to OV assoc with fatigue  And more cough x one - two weeks assoc with noct wheeze so started symbicort 160 2 bid but more hoarse since. Sob with more than slow adls, some better p restarted symbicort, some better with saba. rec Add pepcid 20 mg ac at bedtime as long as having night time symptoms (available over the counter) Stop symbicort  Start dulera 100 Take 2 puffs first thing in am and then another 2 puffs about 12 hours later.  Work on Interior and spatial designer Double lasix rx until f/u with cards as cxr  wet    10/24/2014 f/u ov/Marce Schartz re: copd gold II and gerd /cough  Chief Complaint  Patient presents with  . Follow-up    cough clear SOB  Not limited by breathing from desired activities  Including treadmill 3 x weekly  Sleep ok unless obvious gerd once or twice weekly > cough  More than sob  rec Bed blocks would be a good idea Prilosec Take 30-60 min before first meal of the day  Pepcid 20 mg one at bedtime Dulera 100 1-2 pffs every 12 hours if needed for breathing/ coughing  GERD diet  11/11/2014 f/u ov/Shiann Kam re: GOLD II COPD s/p recent aecopd rx with levaquin/ no steroids Chief Complaint  Patient presents with  . Follow-up    Pt states he was treated for PNA by PCP x 2 weeks ago. He is feeling better but wants to be sure it is gone.   mild congestion esp p supper, still eating biggest meal at supper but compliant with ppi ac in am and pepcid hs still having sense of reflux but no hb p supper Not limited by breathing from desired activities  - has a dulera 100 but rarely uses as worsens dysphonia   No obvious day to day or daytime variabilty or assoc excess/ purulent  sputum or cp or chest tightness, subjective wheeze or overt sinus  symptoms. No unusual exp hx or h/o childhood pna/ asthma or knowledge of premature birth.   Also denies any obvious fluctuation of symptoms with weather or environmental changes or other aggravating or alleviating factors except as outlined above   Current Medications, Allergies, Complete Past Medical History, Past Surgical History, Family History, and Social History were reviewed in [employer] record.  ROS  The following are not active complaints unless bolded sore throat, dysphagia, dental problems, itching, sneezing,  nasal congestion or excess/ purulent secretions, ear ache,   fever, chills, sweats, unintended wt loss, pleuritic or exertional cp, hemoptysis,  orthopnea pnd or leg swelling, presyncope, palpitations, heartburn, abdominal pain, anorexia, nausea, vomiting, diarrhea  or change in bowel or urinary habits, change in stools or urine, dysuria,hematuria,  rash, arthralgias, visual complaints, headache, numbness weakness or ataxia or problems with walking or coordination,  change in mood/affect or memory.                 Objective:   Physical Exam    hoarse amb  wm nad looks younger than stated age   10/24/2014      170  > 11/11/2014  180  Wt Readings from Last 3 Encounters:  12/03/13 190 lb (86.183 kg)  11/21/13 185 lb (83.915 kg)  11/15/13 178 lb (80.74 kg)     HEENT mild turbinate edema.  Oropharynx no thrush or excess pnd or cobblestoning.  No JVD or cervical adenopathy. Mild accessory muscle hypertrophy. Trachea midline, nl thryroid. Chest was hyperinflated by percussion with diminished breath sounds and moderate increased exp time without wheeze. Hoover sign positive at mid inspiration. Regular rate and rhythm without murmur gallop or rub or increase P2 or edema.  Abd: no hsm, nl excursion. Ext warm without cyanosis or clubbing.      10/30/14  Emphysema without acute disease.    Assessment:

## 2014-11-12 ENCOUNTER — Telehealth: Payer: Self-pay | Admitting: *Deleted

## 2014-11-12 NOTE — Assessment & Plan Note (Signed)
Already on ppi/ pepcid Reviewed diet / bed elevation  Will proceed with DG esophagram and GI eval if abn or symptoms persist ? Role by CCB > see hbp

## 2014-11-12 NOTE — Assessment & Plan Note (Signed)
?   Whether ccb in form of amlodipine contributing to es dysfunction - since already also on cardura for prostate in low doses and does not have same efffect on es dysfunction consider increasing this and d/c ccb trial

## 2014-11-12 NOTE — Assessment & Plan Note (Addendum)
-   quit smoking 1970 Spiro 2009:  FEV1 1.72 (64%), ratio 52  - poorly tolerant to ICS due to dysphonia, even with rinsing and spacer    10/24/2014 p extensive coaching HFA effectiveness =    75% > prn dulera 100 rec  Not really clear he had pna but certainly better p levaquin so may just have been aecopd but if so strongly suspect gerd playing a role  See dyshpagia No change in maint rx for now

## 2014-11-12 NOTE — Telephone Encounter (Signed)
-----   Message from Tanda Rockers, MD sent at 11/12/2014  6:28 AM EST ----- i thought he told me he had dulera 100 to use prn - check and see and confirm then ok  To add to  list

## 2014-11-12 NOTE — Telephone Encounter (Signed)
Spoke with the pt and confirmed that he does take Dulera 100 prn  I have added this to his med list  Nothing further needed

## 2014-11-14 ENCOUNTER — Ambulatory Visit (HOSPITAL_COMMUNITY)
Admission: RE | Admit: 2014-11-14 | Discharge: 2014-11-14 | Disposition: A | Discharge status: Home / Self Care | Payer: Medicare Other | Source: Ambulatory Visit | Attending: Internal Medicine | Admitting: Internal Medicine

## 2014-11-14 DIAGNOSIS — R1314 Dysphagia, pharyngoesophageal phase: Secondary | ICD-10-CM | POA: Diagnosis present

## 2014-11-14 NOTE — Progress Notes (Signed)
Quick Note:  Spoke with pt and notified of results per Dr. Melvyn Novas. Pt verbalized understanding. He would like for Dr Melvyn Novas to call him to explain what the GI specialist will want to do, he is unsure about seeing another specialist. Please advise Dr Melvyn Novas. ______

## 2014-12-09 DIAGNOSIS — M9901 Segmental and somatic dysfunction of cervical region: Secondary | ICD-10-CM | POA: Diagnosis not present

## 2014-12-09 DIAGNOSIS — M4724 Other spondylosis with radiculopathy, thoracic region: Secondary | ICD-10-CM | POA: Diagnosis not present

## 2014-12-09 DIAGNOSIS — M9902 Segmental and somatic dysfunction of thoracic region: Secondary | ICD-10-CM | POA: Diagnosis not present

## 2014-12-09 DIAGNOSIS — M4722 Other spondylosis with radiculopathy, cervical region: Secondary | ICD-10-CM | POA: Diagnosis not present

## 2014-12-10 DIAGNOSIS — M4724 Other spondylosis with radiculopathy, thoracic region: Secondary | ICD-10-CM | POA: Diagnosis not present

## 2014-12-10 DIAGNOSIS — M9901 Segmental and somatic dysfunction of cervical region: Secondary | ICD-10-CM | POA: Diagnosis not present

## 2014-12-10 DIAGNOSIS — M9902 Segmental and somatic dysfunction of thoracic region: Secondary | ICD-10-CM | POA: Diagnosis not present

## 2014-12-10 DIAGNOSIS — M4722 Other spondylosis with radiculopathy, cervical region: Secondary | ICD-10-CM | POA: Diagnosis not present

## 2014-12-11 DIAGNOSIS — M4724 Other spondylosis with radiculopathy, thoracic region: Secondary | ICD-10-CM | POA: Diagnosis not present

## 2014-12-11 DIAGNOSIS — M9902 Segmental and somatic dysfunction of thoracic region: Secondary | ICD-10-CM | POA: Diagnosis not present

## 2014-12-11 DIAGNOSIS — M9901 Segmental and somatic dysfunction of cervical region: Secondary | ICD-10-CM | POA: Diagnosis not present

## 2014-12-11 DIAGNOSIS — M4722 Other spondylosis with radiculopathy, cervical region: Secondary | ICD-10-CM | POA: Diagnosis not present

## 2014-12-16 DIAGNOSIS — M9902 Segmental and somatic dysfunction of thoracic region: Secondary | ICD-10-CM | POA: Diagnosis not present

## 2014-12-16 DIAGNOSIS — M9901 Segmental and somatic dysfunction of cervical region: Secondary | ICD-10-CM | POA: Diagnosis not present

## 2014-12-16 DIAGNOSIS — M4724 Other spondylosis with radiculopathy, thoracic region: Secondary | ICD-10-CM | POA: Diagnosis not present

## 2014-12-16 DIAGNOSIS — M4722 Other spondylosis with radiculopathy, cervical region: Secondary | ICD-10-CM | POA: Diagnosis not present

## 2014-12-17 DIAGNOSIS — M4722 Other spondylosis with radiculopathy, cervical region: Secondary | ICD-10-CM | POA: Diagnosis not present

## 2014-12-17 DIAGNOSIS — M9902 Segmental and somatic dysfunction of thoracic region: Secondary | ICD-10-CM | POA: Diagnosis not present

## 2014-12-17 DIAGNOSIS — M9901 Segmental and somatic dysfunction of cervical region: Secondary | ICD-10-CM | POA: Diagnosis not present

## 2014-12-17 DIAGNOSIS — M4724 Other spondylosis with radiculopathy, thoracic region: Secondary | ICD-10-CM | POA: Diagnosis not present

## 2014-12-18 DIAGNOSIS — M9901 Segmental and somatic dysfunction of cervical region: Secondary | ICD-10-CM | POA: Diagnosis not present

## 2014-12-18 DIAGNOSIS — M4722 Other spondylosis with radiculopathy, cervical region: Secondary | ICD-10-CM | POA: Diagnosis not present

## 2014-12-18 DIAGNOSIS — M4724 Other spondylosis with radiculopathy, thoracic region: Secondary | ICD-10-CM | POA: Diagnosis not present

## 2014-12-18 DIAGNOSIS — M9902 Segmental and somatic dysfunction of thoracic region: Secondary | ICD-10-CM | POA: Diagnosis not present

## 2014-12-23 ENCOUNTER — Telehealth: Payer: Self-pay | Admitting: Family Medicine

## 2014-12-23 DIAGNOSIS — M9901 Segmental and somatic dysfunction of cervical region: Secondary | ICD-10-CM | POA: Diagnosis not present

## 2014-12-23 DIAGNOSIS — M4724 Other spondylosis with radiculopathy, thoracic region: Secondary | ICD-10-CM | POA: Diagnosis not present

## 2014-12-23 DIAGNOSIS — M4722 Other spondylosis with radiculopathy, cervical region: Secondary | ICD-10-CM | POA: Diagnosis not present

## 2014-12-23 DIAGNOSIS — M9902 Segmental and somatic dysfunction of thoracic region: Secondary | ICD-10-CM | POA: Diagnosis not present

## 2014-12-23 NOTE — Telephone Encounter (Signed)
Spoke with patients wife and she stated that he will come by and receive the flu shot.

## 2014-12-24 DIAGNOSIS — M9902 Segmental and somatic dysfunction of thoracic region: Secondary | ICD-10-CM | POA: Diagnosis not present

## 2014-12-24 DIAGNOSIS — M9901 Segmental and somatic dysfunction of cervical region: Secondary | ICD-10-CM | POA: Diagnosis not present

## 2014-12-24 DIAGNOSIS — M4724 Other spondylosis with radiculopathy, thoracic region: Secondary | ICD-10-CM | POA: Diagnosis not present

## 2014-12-24 DIAGNOSIS — M4722 Other spondylosis with radiculopathy, cervical region: Secondary | ICD-10-CM | POA: Diagnosis not present

## 2014-12-25 DIAGNOSIS — M9901 Segmental and somatic dysfunction of cervical region: Secondary | ICD-10-CM | POA: Diagnosis not present

## 2014-12-25 DIAGNOSIS — M9902 Segmental and somatic dysfunction of thoracic region: Secondary | ICD-10-CM | POA: Diagnosis not present

## 2014-12-25 DIAGNOSIS — M4724 Other spondylosis with radiculopathy, thoracic region: Secondary | ICD-10-CM | POA: Diagnosis not present

## 2014-12-25 DIAGNOSIS — M4722 Other spondylosis with radiculopathy, cervical region: Secondary | ICD-10-CM | POA: Diagnosis not present

## 2015-01-01 DIAGNOSIS — M9901 Segmental and somatic dysfunction of cervical region: Secondary | ICD-10-CM | POA: Diagnosis not present

## 2015-01-01 DIAGNOSIS — M9902 Segmental and somatic dysfunction of thoracic region: Secondary | ICD-10-CM | POA: Diagnosis not present

## 2015-01-01 DIAGNOSIS — M4722 Other spondylosis with radiculopathy, cervical region: Secondary | ICD-10-CM | POA: Diagnosis not present

## 2015-01-01 DIAGNOSIS — M4724 Other spondylosis with radiculopathy, thoracic region: Secondary | ICD-10-CM | POA: Diagnosis not present

## 2015-01-06 DIAGNOSIS — M4724 Other spondylosis with radiculopathy, thoracic region: Secondary | ICD-10-CM | POA: Diagnosis not present

## 2015-01-06 DIAGNOSIS — M9902 Segmental and somatic dysfunction of thoracic region: Secondary | ICD-10-CM | POA: Diagnosis not present

## 2015-01-06 DIAGNOSIS — M9901 Segmental and somatic dysfunction of cervical region: Secondary | ICD-10-CM | POA: Diagnosis not present

## 2015-01-06 DIAGNOSIS — M4722 Other spondylosis with radiculopathy, cervical region: Secondary | ICD-10-CM | POA: Diagnosis not present

## 2015-01-08 DIAGNOSIS — M9901 Segmental and somatic dysfunction of cervical region: Secondary | ICD-10-CM | POA: Diagnosis not present

## 2015-01-08 DIAGNOSIS — M4722 Other spondylosis with radiculopathy, cervical region: Secondary | ICD-10-CM | POA: Diagnosis not present

## 2015-01-08 DIAGNOSIS — M4724 Other spondylosis with radiculopathy, thoracic region: Secondary | ICD-10-CM | POA: Diagnosis not present

## 2015-01-08 DIAGNOSIS — M9902 Segmental and somatic dysfunction of thoracic region: Secondary | ICD-10-CM | POA: Diagnosis not present

## 2015-01-13 ENCOUNTER — Other Ambulatory Visit: Payer: Self-pay | Admitting: Internal Medicine

## 2015-01-13 DIAGNOSIS — M9902 Segmental and somatic dysfunction of thoracic region: Secondary | ICD-10-CM | POA: Diagnosis not present

## 2015-01-13 DIAGNOSIS — M4722 Other spondylosis with radiculopathy, cervical region: Secondary | ICD-10-CM | POA: Diagnosis not present

## 2015-01-13 DIAGNOSIS — R0600 Dyspnea, unspecified: Secondary | ICD-10-CM

## 2015-01-13 DIAGNOSIS — M4724 Other spondylosis with radiculopathy, thoracic region: Secondary | ICD-10-CM | POA: Diagnosis not present

## 2015-01-13 DIAGNOSIS — M9901 Segmental and somatic dysfunction of cervical region: Secondary | ICD-10-CM | POA: Diagnosis not present

## 2015-01-14 ENCOUNTER — Encounter: Payer: Self-pay | Admitting: Internal Medicine

## 2015-01-14 ENCOUNTER — Ambulatory Visit (INDEPENDENT_AMBULATORY_CARE_PROVIDER_SITE_OTHER): Payer: Medicare Other | Admitting: Internal Medicine

## 2015-01-14 VITALS — BP 144/62 | HR 66 | Ht 68.0 in | Wt 180.0 lb

## 2015-01-14 DIAGNOSIS — Z23 Encounter for immunization: Secondary | ICD-10-CM | POA: Diagnosis not present

## 2015-01-14 DIAGNOSIS — R0600 Dyspnea, unspecified: Secondary | ICD-10-CM | POA: Diagnosis not present

## 2015-01-14 DIAGNOSIS — J449 Chronic obstructive pulmonary disease, unspecified: Secondary | ICD-10-CM

## 2015-01-14 LAB — PULMONARY FUNCTION TEST
DL/VA % pred: 64 %
DL/VA: 2.85 ml/min/mmHg/L
DLCO UNC % PRED: 51 %
DLCO UNC: 15.28 ml/min/mmHg
FEF 25-75 Post: 1.75 L/sec
FEF 25-75 Pre: 1.23 L/sec
FEF2575-%Change-Post: 42 %
FEF2575-%Pred-Post: 124 %
FEF2575-%Pred-Pre: 87 %
FEV1-%CHANGE-POST: 7 %
FEV1-%PRED-POST: 106 %
FEV1-%PRED-PRE: 98 %
FEV1-PRE: 2.27 L
FEV1-Post: 2.44 L
FEV1FVC-%CHANGE-POST: 1 %
FEV1FVC-%PRED-PRE: 95 %
FEV6-%Change-Post: 5 %
FEV6-%PRED-POST: 113 %
FEV6-%Pred-Pre: 107 %
FEV6-PRE: 3.31 L
FEV6-Post: 3.49 L
FEV6FVC-%Change-Post: 0 %
FEV6FVC-%PRED-POST: 106 %
FEV6FVC-%PRED-PRE: 106 %
FVC-%Change-Post: 6 %
FVC-%Pred-Post: 106 %
FVC-%Pred-Pre: 100 %
FVC-POST: 3.58 L
FVC-Pre: 3.38 L
POST FEV1/FVC RATIO: 68 %
POST FEV6/FVC RATIO: 98 %
Pre FEV1/FVC ratio: 67 %
Pre FEV6/FVC Ratio: 98 %
RV % PRED: 80 %
RV: 2.18 L
TLC % PRED: 88 %
TLC: 5.93 L

## 2015-01-14 NOTE — Patient Instructions (Signed)
You do not have significant copd now and so never will  There may be some mild asthma present for which dulera taken up to 2 puffs evey 12 hours should be adequate whenever you have cough/ short of breath  Most of your symptoms are likely reflux related for which diet/ wt loss are the most important treatments   If you are satisfied with your treatment plan,  let your doctor know and he/she can either refill your medications or you can return here when your prescription runs out.     If in any way you are not 100% satisfied,  please tell us.  If 100% better, tell your friends!  Pulmonary follow up is as needed

## 2015-01-14 NOTE — Progress Notes (Signed)
PFT done today. 

## 2015-01-14 NOTE — Assessment & Plan Note (Addendum)
-   quit smoking 1970 Spiro 2009:  FEV1 1.72 (64%), ratio 52  - poorly tolerant to ICS due to dysphonia, even with rinsing and spacer    10/24/2014 p extensive coaching HFA effectiveness =    75% > prn dulera 100 rec - PFT's  FEV1 2.44 (106%) ratio 68 no change p saba off dulera x weeks/ dlco 51% corrects to 61 with alv vol    I had an extended discussion with the patient reviewing all relevant studies completed to date and  lasting 15 to 20 minutes of a 25 minute visit on the following ongoing concerns:  1) if has copd at all it is very mild and this could also be chronic mild asthma   2) most of his symptoms though are likely gerd related.  3) since he is so prone to upper airway symptoms / sensitive to ICS with such mild dz agree with continuing to use dulera 100 up to 2 bid prn   4) pulmonary f/u can be prn

## 2015-01-14 NOTE — Progress Notes (Signed)
Subjective:     Patient ID: Patrick Rodriguez, male   DOB: 1925-07-24    MRN: [id number]    Brief patient profile:  69 yowm quit smoking 1970 with documented  copd on prn symbiocrt 160 2bid due to severe hoarseness only  GOLD I on f/u pfts 01/14/2015    History of Present Illness  12/03/13 ov/Wert  Abruptly more sob with exertion since acute GIB x one month prior to OV assoc with fatigue  And more cough x one - two weeks assoc with noct wheeze so started symbicort 160 2 bid but more hoarse since. Sob with more than slow adls, some better p restarted symbicort, some better with saba. rec Add pepcid 20 mg ac at bedtime as long as having night time symptoms (available over the counter) Stop symbicort  Start dulera 100 Take 2 puffs first thing in am and then another 2 puffs about 12 hours later.  Work on Interior and spatial designer Double lasix rx until f/u with cards as cxr  wet    10/24/2014 f/u ov/Wert re: copd   and gerd /cough  Chief Complaint  Patient presents with  . Follow-up    cough clear SOB  Not limited by breathing from desired activities  Including treadmill 3 x weekly  Sleep ok unless obvious gerd once or twice weekly > cough  More than sob  rec Bed blocks would be a good idea Prilosec Take 30-60 min before first meal of the day  Pepcid 20 mg one at bedtime Dulera 100 1-2 pffs every 12 hours if needed for breathing/ coughing  GERD diet  11/11/2014 f/u ov/Wert re: COPD s/p recent aecopd rx with levaquin/ no steroids Chief Complaint  Patient presents with  . Follow-up    Pt states he was treated for PNA by PCP x 2 weeks ago. He is feeling better but wants to be sure it is gone.   mild congestion esp p supper, still eating biggest meal at supper but compliant with ppi ac in am and pepcid hs still having sense of reflux but no hb p supper Not limited by breathing from desired activities  - has a dulera 100 but rarely uses as worsens dysphonia  01/14/2015 f/u ov/Wert re: GOLD I  copd  Chief Complaint  Patient presents with  . Follow-up    PFT done today. Breathing is doing well. No new co's today.    Not limited by breathing from desired activities   Rarely using dulera / never saba   No obvious day to day or daytime variabilty or assoc excess/ purulent sputum or cp or chest tightness, subjective wheeze or overt sinus  symptoms. No unusual exp hx or h/o childhood pna/ asthma or knowledge of premature birth.   Also denies any obvious fluctuation of symptoms with weather or environmental changes or other aggravating or alleviating factors except as outlined above   Current Medications, Allergies, Complete Past Medical History, Past Surgical History, Family History, and Social History were reviewed in [employer] record.  ROS  The following are not active complaints unless bolded sore throat, dysphagia, dental problems, itching, sneezing,  nasal congestion or excess/ purulent secretions, ear ache,   fever, chills, sweats, unintended wt loss, pleuritic or exertional cp, hemoptysis,  orthopnea pnd or leg swelling, presyncope, palpitations, heartburn, abdominal pain, anorexia, nausea, vomiting, diarrhea  or change in bowel or urinary habits, change in stools or urine, dysuria,hematuria,  rash, arthralgias, visual complaints, headache, numbness weakness or  ataxia or problems with walking or coordination,  change in mood/affect or memory.                 Objective:   Physical Exam   amb  wm nad looks younger than stated age   10/24/2014      170  > 11/11/2014  180 > 01/14/2015  180  Wt Readings from Last 3 Encounters:  12/03/13 190 lb (86.183 kg)  11/21/13 185 lb (83.915 kg)  11/15/13 178 lb (80.74 kg)     HEENT mild turbinate edema.  Oropharynx no thrush or excess pnd or cobblestoning.  No JVD or cervical adenopathy. Mild accessory muscle hypertrophy. Trachea midline, nl thryroid. Chest was hyperinflated by percussion with diminished breath  sounds and moderate increased exp time without wheeze. Hoover sign positive at mid inspiration. Regular rate and rhythm without murmur gallop or rub or increase P2 or edema.  Abd: no hsm, nl excursion. Ext warm without cyanosis or clubbing.      10/30/14  Emphysema without acute disease.   Assessment:

## 2015-01-15 DIAGNOSIS — M9902 Segmental and somatic dysfunction of thoracic region: Secondary | ICD-10-CM | POA: Diagnosis not present

## 2015-01-15 DIAGNOSIS — M9901 Segmental and somatic dysfunction of cervical region: Secondary | ICD-10-CM | POA: Diagnosis not present

## 2015-01-15 DIAGNOSIS — M4724 Other spondylosis with radiculopathy, thoracic region: Secondary | ICD-10-CM | POA: Diagnosis not present

## 2015-01-15 DIAGNOSIS — M4722 Other spondylosis with radiculopathy, cervical region: Secondary | ICD-10-CM | POA: Diagnosis not present

## 2015-01-16 ENCOUNTER — Other Ambulatory Visit: Payer: Self-pay | Admitting: Dermatology

## 2015-01-16 DIAGNOSIS — D225 Melanocytic nevi of trunk: Secondary | ICD-10-CM | POA: Diagnosis not present

## 2015-01-16 DIAGNOSIS — L570 Actinic keratosis: Secondary | ICD-10-CM | POA: Diagnosis not present

## 2015-01-16 DIAGNOSIS — L821 Other seborrheic keratosis: Secondary | ICD-10-CM | POA: Diagnosis not present

## 2015-01-16 DIAGNOSIS — Z85828 Personal history of other malignant neoplasm of skin: Secondary | ICD-10-CM | POA: Diagnosis not present

## 2015-01-16 DIAGNOSIS — C44311 Basal cell carcinoma of skin of nose: Secondary | ICD-10-CM | POA: Diagnosis not present

## 2015-01-21 DIAGNOSIS — M9901 Segmental and somatic dysfunction of cervical region: Secondary | ICD-10-CM | POA: Diagnosis not present

## 2015-01-21 DIAGNOSIS — M4722 Other spondylosis with radiculopathy, cervical region: Secondary | ICD-10-CM | POA: Diagnosis not present

## 2015-01-21 DIAGNOSIS — M4724 Other spondylosis with radiculopathy, thoracic region: Secondary | ICD-10-CM | POA: Diagnosis not present

## 2015-01-21 DIAGNOSIS — M9902 Segmental and somatic dysfunction of thoracic region: Secondary | ICD-10-CM | POA: Diagnosis not present

## 2015-01-22 DIAGNOSIS — M4722 Other spondylosis with radiculopathy, cervical region: Secondary | ICD-10-CM | POA: Diagnosis not present

## 2015-01-22 DIAGNOSIS — M9901 Segmental and somatic dysfunction of cervical region: Secondary | ICD-10-CM | POA: Diagnosis not present

## 2015-01-22 DIAGNOSIS — M4724 Other spondylosis with radiculopathy, thoracic region: Secondary | ICD-10-CM | POA: Diagnosis not present

## 2015-01-22 DIAGNOSIS — M9902 Segmental and somatic dysfunction of thoracic region: Secondary | ICD-10-CM | POA: Diagnosis not present

## 2015-01-28 DIAGNOSIS — M9902 Segmental and somatic dysfunction of thoracic region: Secondary | ICD-10-CM | POA: Diagnosis not present

## 2015-01-28 DIAGNOSIS — M9901 Segmental and somatic dysfunction of cervical region: Secondary | ICD-10-CM | POA: Diagnosis not present

## 2015-01-28 DIAGNOSIS — M4724 Other spondylosis with radiculopathy, thoracic region: Secondary | ICD-10-CM | POA: Diagnosis not present

## 2015-01-28 DIAGNOSIS — M4722 Other spondylosis with radiculopathy, cervical region: Secondary | ICD-10-CM | POA: Diagnosis not present

## 2015-02-03 ENCOUNTER — Encounter: Payer: Self-pay | Admitting: *Deleted

## 2015-02-07 ENCOUNTER — Other Ambulatory Visit: Payer: Self-pay | Admitting: Cardiology

## 2015-03-12 DIAGNOSIS — N200 Calculus of kidney: Secondary | ICD-10-CM | POA: Diagnosis not present

## 2015-03-12 DIAGNOSIS — R361 Hematospermia: Secondary | ICD-10-CM | POA: Diagnosis not present

## 2015-03-12 DIAGNOSIS — R102 Pelvic and perineal pain: Secondary | ICD-10-CM | POA: Diagnosis not present

## 2015-04-08 ENCOUNTER — Encounter: Payer: Self-pay | Admitting: *Deleted

## 2015-05-22 ENCOUNTER — Encounter: Payer: Self-pay | Admitting: *Deleted

## 2015-06-02 ENCOUNTER — Other Ambulatory Visit: Payer: Self-pay

## 2015-06-02 ENCOUNTER — Telehealth: Payer: Self-pay | Admitting: *Deleted

## 2015-06-02 NOTE — Telephone Encounter (Signed)
Patient phoned in response to my letter Friday evening, requesting a call back to schedule an AWV. Reviewed previous encounters (to conclude whom patient had seen the most) when patient stated he preferred a male physician.  Reviewed 2015 encounters, and he had seen Dr. Everlene Farrier 5 times in 2015, which is who the patient deemed his PCP.  Scheduled patient for 06/19/15 with Daub & instructed NPO. Patient verbalized understanding & appreciation.

## 2015-06-02 NOTE — Telephone Encounter (Signed)
This is fine 

## 2015-06-19 ENCOUNTER — Ambulatory Visit (INDEPENDENT_AMBULATORY_CARE_PROVIDER_SITE_OTHER): Payer: Medicare Other | Admitting: Emergency Medicine

## 2015-06-19 ENCOUNTER — Encounter: Payer: Self-pay | Admitting: Emergency Medicine

## 2015-06-19 VITALS — BP 152/66 | HR 66 | Temp 97.5°F | Resp 16 | Ht 67.0 in | Wt 165.2 lb

## 2015-06-19 DIAGNOSIS — R42 Dizziness and giddiness: Secondary | ICD-10-CM

## 2015-06-19 DIAGNOSIS — K219 Gastro-esophageal reflux disease without esophagitis: Secondary | ICD-10-CM | POA: Diagnosis not present

## 2015-06-19 DIAGNOSIS — R053 Chronic cough: Secondary | ICD-10-CM

## 2015-06-19 DIAGNOSIS — R361 Hematospermia: Secondary | ICD-10-CM | POA: Diagnosis not present

## 2015-06-19 DIAGNOSIS — Z0000 Encounter for general adult medical examination without abnormal findings: Secondary | ICD-10-CM | POA: Diagnosis not present

## 2015-06-19 DIAGNOSIS — Z125 Encounter for screening for malignant neoplasm of prostate: Secondary | ICD-10-CM | POA: Diagnosis not present

## 2015-06-19 DIAGNOSIS — Z79899 Other long term (current) drug therapy: Secondary | ICD-10-CM | POA: Diagnosis not present

## 2015-06-19 DIAGNOSIS — R05 Cough: Secondary | ICD-10-CM | POA: Diagnosis not present

## 2015-06-19 DIAGNOSIS — R809 Proteinuria, unspecified: Secondary | ICD-10-CM

## 2015-06-19 DIAGNOSIS — E559 Vitamin D deficiency, unspecified: Secondary | ICD-10-CM | POA: Diagnosis not present

## 2015-06-19 DIAGNOSIS — Z23 Encounter for immunization: Secondary | ICD-10-CM | POA: Diagnosis not present

## 2015-06-19 DIAGNOSIS — K921 Melena: Secondary | ICD-10-CM | POA: Diagnosis not present

## 2015-06-19 LAB — COMPLETE METABOLIC PANEL WITH GFR
ALBUMIN: 4.2 g/dL (ref 3.5–5.2)
ALK PHOS: 50 U/L (ref 39–117)
ALT: 16 U/L (ref 0–53)
AST: 20 U/L (ref 0–37)
BUN: 20 mg/dL (ref 6–23)
CALCIUM: 8.9 mg/dL (ref 8.4–10.5)
CHLORIDE: 103 meq/L (ref 96–112)
CO2: 22 mEq/L (ref 19–32)
CREATININE: 1.09 mg/dL (ref 0.50–1.35)
GFR, EST AFRICAN AMERICAN: 70 mL/min
GFR, EST NON AFRICAN AMERICAN: 60 mL/min
Glucose, Bld: 107 mg/dL — ABNORMAL HIGH (ref 70–99)
POTASSIUM: 4.4 meq/L (ref 3.5–5.3)
SODIUM: 138 meq/L (ref 135–145)
TOTAL PROTEIN: 6.6 g/dL (ref 6.0–8.3)
Total Bilirubin: 0.8 mg/dL (ref 0.2–1.2)

## 2015-06-19 LAB — CBC WITH DIFFERENTIAL/PLATELET
BASOS ABS: 0.2 10*3/uL — AB (ref 0.0–0.1)
Basophils Relative: 2 % — ABNORMAL HIGH (ref 0–1)
EOS ABS: 0.3 10*3/uL (ref 0.0–0.7)
EOS PCT: 3 % (ref 0–5)
HCT: 38 % — ABNORMAL LOW (ref 39.0–52.0)
Hemoglobin: 13.4 g/dL (ref 13.0–17.0)
LYMPHS ABS: 1.8 10*3/uL (ref 0.7–4.0)
LYMPHS PCT: 21 % (ref 12–46)
MCH: 31.7 pg (ref 26.0–34.0)
MCHC: 35.3 g/dL (ref 30.0–36.0)
MCV: 89.8 fL (ref 78.0–100.0)
MONOS PCT: 9 % (ref 3–12)
MPV: 9.1 fL (ref 8.6–12.4)
Monocytes Absolute: 0.8 10*3/uL (ref 0.1–1.0)
Neutro Abs: 5.7 10*3/uL (ref 1.7–7.7)
Neutrophils Relative %: 65 % (ref 43–77)
PLATELETS: 286 10*3/uL (ref 150–400)
RBC: 4.23 MIL/uL (ref 4.22–5.81)
RDW: 16.5 % — ABNORMAL HIGH (ref 11.5–15.5)
WBC: 8.7 10*3/uL (ref 4.0–10.5)

## 2015-06-19 LAB — TSH: TSH: 1.813 u[IU]/mL (ref 0.350–4.500)

## 2015-06-19 LAB — POCT URINALYSIS DIPSTICK
BILIRUBIN UA: NEGATIVE
Blood, UA: NEGATIVE
Glucose, UA: NEGATIVE
Ketones, UA: NEGATIVE
LEUKOCYTES UA: NEGATIVE
NITRITE UA: NEGATIVE
Protein, UA: 100
Spec Grav, UA: 1.02
Urobilinogen, UA: 0.2
pH, UA: 6.5

## 2015-06-19 LAB — POCT UA - MICROSCOPIC ONLY
CASTS, UR, LPF, POC: NEGATIVE
Crystals, Ur, HPF, POC: NEGATIVE
Epithelial cells, urine per micros: NEGATIVE
Mucus, UA: POSITIVE
Yeast, UA: NEGATIVE

## 2015-06-19 LAB — FERRITIN: Ferritin: 58 ng/mL (ref 22–322)

## 2015-06-19 LAB — VITAMIN B12: Vitamin B-12: 1428 pg/mL — ABNORMAL HIGH (ref 211–911)

## 2015-06-19 LAB — MAGNESIUM: Magnesium: 2 mg/dL (ref 1.5–2.5)

## 2015-06-19 NOTE — Progress Notes (Signed)
Subjective:    Patient ID: Patrick Rodriguez, male    DOB: 1925/11/30, 81 y.o.   MRN: [id number] This chart was scribed for Arlyss Queen, MD by [name], Medical Scribe. This patient was seen in room 23 and the patient's care was started at 10:51 AM.   HPI Patrick Rodriguez is a 81 y.o. male with a history of BPH, vertigo, and hypertension who presents to [facility] for a Medicare Well Visit. Patient has been doing well overall. He is mainly here to have lab work done. He has been on Prilosec for many years and has not had lab work done recently. Patient does exercise regularly by walking and working out on the [employer]. He states he had a history of alcohol abuse, but has since quit drinking and smoking at the age of 63.  Urology: Patient was seen by urology a few months ago for complaints of blood in semen, which has resolved at this point. He is unsure if he had his PSA tested then. He does note having a history of BPH. Patient reports having some intermittent, burning pain especially at night, which wakes him up 2-3 times per night. The pain is resolved upon urinating. He does not typically feel the pain during the day.  Vertigo: Patient has occasional dizziness upon waking up in the mornings due to vertigo.  Hypertension: Patient states he discontinued Cardura on his own, which has resolved his leg swelling.  GERD: He is on Prilosec for this. He has cut out evening meals which has helped with his symptoms.  Immunizations: He has had the pneumovax vaccine before and has received his Prevnar vaccine today in the office. He has not had the shingles vaccine, but he did have shingles about 20 years ago.  GI: Patient has been having his colonoscopies done at [facility]. At his last colonoscopy, he had polyps removed and was also found to have diverticulosis.  Vision: He has his eyes checked annually by Dr. Einar Gip.  Hearing: He currently wears hearing aids.  Cardiology:  He sees his cardiologist, Dr. Martinique, yearly.  Patient has been married to his wife for 45 years.  Review of Systems  Constitutional: Positive for fatigue.  HENT: Positive for hearing loss.   Respiratory: Positive for cough.   Endocrine: Positive for polyuria.  Genitourinary: Positive for urgency and frequency.  Neurological: Positive for dizziness and light-headedness.  Hematological: Bruises/bleeds easily.  All other systems reviewed and are negative.      Objective:   Physical Exam  Constitutional: He appears well-developed and well-nourished.  HENT:  Right Ear: External ear normal.  Left Ear: External ear normal.  Eyes: Pupils are equal, round, and reactive to light.  Neck: No thyromegaly present.  Cardiovascular: Normal rate and regular rhythm.   Pulmonary/Chest: No respiratory distress. He has no wheezes. He has no rales.  Abdominal: Soft.  He has a large diastases recti as well as a 2 cm umbilical hernia  Genitourinary:  His prostate is mildly enlarged but there is no nodularity felt  Musculoskeletal: Normal range of motion.  Neurological: He is alert. He has normal reflexes. No cranial nerve deficit. Coordination normal.  Skin: Skin is dry.  Psychiatric: He has a normal mood and affect. His behavior is normal. Judgment and thought content normal.   Results for orders placed or performed in visit on 06/19/15  COMPLETE METABOLIC PANEL WITH GFR  Result Value Ref Range   Sodium  135 -  145 mEq/L   Potassium  3.5 - 5.3 mEq/L   Chloride  96 - 112 mEq/L   CO2  19 - 32 mEq/L   Glucose, Bld  70 - 99 mg/dL   BUN  6 - 23 mg/dL   Creat  0.50 - 1.35 mg/dL   Total Bilirubin  0.2 - 1.2 mg/dL   Alkaline Phosphatase  39 - 117 U/L   AST  0 - 37 U/L   ALT  0 - 53 U/L   Total Protein  6.0 - 8.3 g/dL   Albumin  3.5 - 5.2 g/dL   Calcium  8.4 - 10.5 mg/dL   GFR, Est African American  mL/min   GFR, Est Non African American  mL/min  TSH  Result Value Ref Range   TSH  0.350 -  4.500 uIU/mL  PSA, Medicare  Result Value Ref Range   PSA  <=4.00 ng/mL  Ferritin  Result Value Ref Range   Ferritin  22 - 322 ng/mL  Vit D  25 hydroxy (rtn osteoporosis monitoring)  Result Value Ref Range   Vit D, 25-Hydroxy  30 - 100 ng/mL  Vitamin B12  Result Value Ref Range   Vitamin B-12  211 - 911 pg/mL  Magnesium  Result Value Ref Range   Magnesium  1.5 - 2.5 mg/dL  CBC with Differential/Platelet  Result Value Ref Range   WBC 8.7 4.0 - 10.5 K/uL   RBC 4.23 4.22 - 5.81 MIL/uL   Hemoglobin 13.4 13.0 - 17.0 g/dL   HCT 38.0 (L) 39.0 - 52.0 %   MCV 89.8 78.0 - 100.0 fL   MCH 31.7 26.0 - 34.0 pg   MCHC 35.3 30.0 - 36.0 g/dL   RDW 16.5 (H) 11.5 - 15.5 %   Platelets 286 150 - 400 K/uL   MPV 9.1 8.6 - 12.4 fL   Neutrophils Relative % 65 43 - 77 %   Neutro Abs 5.7 1.7 - 7.7 K/uL   Lymphocytes Relative 21 12 - 46 %   Lymphs Abs 1.8 0.7 - 4.0 K/uL   Monocytes Relative 9 3 - 12 %   Monocytes Absolute 0.8 0.1 - 1.0 K/uL   Eosinophils Relative 3 0 - 5 %   Eosinophils Absolute 0.3 0.0 - 0.7 K/uL   Basophils Relative 2 (H) 0 - 1 %   Basophils Absolute 0.2 (H) 0.0 - 0.1 K/uL   Smear Review Criteria for review not met   POCT urinalysis dipstick  Result Value Ref Range   Color, UA yellow    Clarity, UA clear    Glucose, UA neg    Bilirubin, UA neg    Ketones, UA neg    Spec Grav, UA 1.020    Blood, UA neg    pH, UA 6.5    Protein, UA 100    Urobilinogen, UA 0.2    Nitrite, UA neg    Leukocytes, UA Negative Negative  POCT UA - Microscopic Only  Result Value Ref Range   WBC, Ur, HPF, POC 0-1    RBC, urine, microscopic 1-3    Bacteria, U Microscopic trace    Mucus, UA pos    Epithelial cells, urine per micros neg    Crystals, Ur, HPF, POC neg    Casts, Ur, LPF, POC neg    Yeast, UA neg          Assessment & Plan:  1. Need for Streptococcus pneumoniae vaccination  - Pneumococcal conjugate vaccine 13-valent IM  2. Annual physical exam He has an umbilical hernia  but otherwise his physical was essentially normal for a 81 year old gentleman he looks much younger than his stated age  8. Chronic cough He was cleared by Dr. Melvyn Novas no longer has to use his inhaler   4. Dizziness and giddiness He has had no syncopal episodes. This is a mild cueing as he feels at times when he turns his head quickly. This has not been a debilitating problem. - COMPLETE METABOLIC PANEL WITH GFR - TSH  5. Gastroesophageal reflux disease, esophagitis presence not specified He is worried about chronic PPI therapy. - CBC with Differential/Platelet  6. Gastrointestinal hemorrhage with melena This has been stable. He has had no further bleeding episodes - CBC with Differential/Platelet  7. High risk medication use we'll check levels of these because patient is on chronic PPI- Ferritin - Vit D  25 hydroxy (rtn osteoporosis monitoring) - Vitamin B12 - Magnesium  HEMATOSPERMIA  - POCT urinalysis dipstick - POCT UA - Microscopic Only - PSA, Medicare 9. Proteinuria as long as his BUN and creatinine are stable we'll just repeat this in the near future I personally performed the services described in this documentation, which was scribed in my presence. The recorded information has been reviewed and is accurate.  Arlyss Queen, MD  [facility] [place]  06/19/2015 11:43 AM

## 2015-06-20 LAB — VITAMIN D 25 HYDROXY (VIT D DEFICIENCY, FRACTURES): Vit D, 25-Hydroxy: 30 ng/mL (ref 30–100)

## 2015-06-20 LAB — PSA, MEDICARE: PSA: 1.28 ng/mL (ref <=4.00)

## 2015-07-15 DIAGNOSIS — N401 Enlarged prostate with lower urinary tract symptoms: Secondary | ICD-10-CM | POA: Diagnosis not present

## 2015-07-15 DIAGNOSIS — R3912 Poor urinary stream: Secondary | ICD-10-CM | POA: Diagnosis not present

## 2015-07-15 DIAGNOSIS — R351 Nocturia: Secondary | ICD-10-CM | POA: Diagnosis not present

## 2015-07-15 DIAGNOSIS — N138 Other obstructive and reflux uropathy: Secondary | ICD-10-CM | POA: Diagnosis not present

## 2015-08-13 ENCOUNTER — Ambulatory Visit: Payer: Medicare Other | Admitting: Cardiology

## 2015-08-14 ENCOUNTER — Ambulatory Visit (INDEPENDENT_AMBULATORY_CARE_PROVIDER_SITE_OTHER): Payer: Medicare Other | Admitting: Cardiology

## 2015-08-14 ENCOUNTER — Encounter: Payer: Self-pay | Admitting: Cardiology

## 2015-08-14 VITALS — BP 146/64 | HR 68 | Ht 68.5 in | Wt 167.0 lb

## 2015-08-14 DIAGNOSIS — I2510 Atherosclerotic heart disease of native coronary artery without angina pectoris: Secondary | ICD-10-CM

## 2015-08-14 DIAGNOSIS — I10 Essential (primary) hypertension: Secondary | ICD-10-CM

## 2015-08-14 NOTE — Patient Instructions (Signed)
If your blood pressure remains good you can try and taper the amlodipine  Otherwise continue lifestyle modification  I will see you in one year

## 2015-08-14 NOTE — Progress Notes (Signed)
[name] Date of Birth: 1925-12-05 Medical Record #[id number]  History of Present Illness: Patrick Rodriguez is seen for follow up CAD and HTN. He has a history of HLD, HTN, CAD with remote stenting of the LAD in 2000, obesity, ED, syncope in 2013 felt to be due to dehydration. HCTZ was stopped at that time.  Stress Myoview 1/17 was normal with EF 70%. Monitor showed PVCs and PACs but no serious arrhythmia. He had a new anemia with Hgb 8.8. He underwent GI evaluation which demonstrated several polyps and diverticulosis. He is on iron therapy. Hgb is now normal.  On follow up today he is feeling very well. 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 He has been walking daily 2 miles and lost 18 lbs. He works out on [employer] twice a week. His BP improved and he stopped Cardura.   Current Outpatient Prescriptions on File Prior to Visit  Medication Sig Dispense Refill  . amLODipine (NORVASC) 5 MG tablet TAKE ONE TABLET BY MOUTH TWICE DAILY 60 tablet 10  . Ascorbic Acid (VITAMIN C) 1000 MG tablet Take 1,000 mg by mouth daily.      Marland Kitchen aspirin 81 MG tablet Take 81 mg by mouth daily.    Marland Kitchen b complex vitamins tablet Take 1 tablet by mouth daily.      . finasteride (PROPECIA) 1 MG tablet Take 1 mg by mouth daily.    . magnesium gluconate (MAGONATE) 500 MG tablet Take 500 mg by mouth daily.      . mometasone-formoterol (DULERA) 100-5 MCG/ACT AERO Inhale 2 puffs into the lungs 2 (two) times daily as needed for wheezing.    . Nattokinase 100 MG CAPS Take by mouth daily.      Marland Kitchen omeprazole (PRILOSEC) 20 MG capsule Take 20 mg by mouth daily.    . vitamin E 400 UNIT capsule Take 400 Units by mouth daily.      Marland Kitchen zinc gluconate 50 MG tablet Take 50 mg by mouth daily.      . [DISCONTINUED] losartan-hydrochlorothiazide (HYZAAR) 100-25 MG per tablet Take 1 tablet by mouth daily. 30 tablet 5   No current facility-administered medications on file prior to visit.    Allergies  Allergen Reactions  . Hctz  [Hydrochlorothiazide]     Has had hyponatremia    Past Medical History  Diagnosis Date  . Hyperlipidemia   . Hypertension   . Coronary artery disease     a.  s/p stenting of the LAD in 2000;  b. Lexiscan Myoview (12/2013): No ischemia, EF 70%; normal study  . Obesity   . ED (erectile dysfunction)   . Allergic rhinitis   . Normal nuclear stress test 2008  . Syncope Jan 2013    felt to be vasovagal; had normal echo, carotids ok, 1st degree AV block with mention (but no tracing) of 2nd degree type I AV block while in [place]  . GERD (gastroesophageal reflux disease)   . Normal echocardiogram Jan 2013    EF of 55% and no wall motion abnormalities  . Carotid artery stenosis     12/2011:  50-69% on the left and 1-49% on the right;  Carotid US (11/2013): Bilateral 40-59%; repeat 1 year  . Palpitations     Holter (11/2013): NSR, sinus brady, PVCs, rare blocked PAC, no sig arrhythmia  . Cataract   . Myocardial infarction   . Substance abuse     Past Surgical History  Procedure Laterality Date  . Cardiac catheterization  01/20/1999    EF 60%  . Tonsillectomy    . Appendectomy    . Doppler echocardiography  02/06/1998    EF 60%  . Cardiovascular stress test  04/19/2007    EF 74%  . Coronary stent placement  2000    LAD    History  Smoking status  . Former Smoker -- 2.00 packs/day for 28 years  . Types: Cigarettes  . Quit date: 12/06/1968  Smokeless tobacco  . Never Used    History  Alcohol Use No    Family History  Problem Relation Age of Onset  . Aneurysm Mother   . Stomach cancer Father   . Cancer Father   . Cancer Sister     Review of Systems: As noted in history of present illness. All other systems were reviewed and are negative.  Physical Exam: BP 146/64 mmHg  Pulse 68  Ht 5' 8.5" (1.74 m)  Wt 75.751 kg (167 lb)  BMI 25.02 kg/m2 Patient is very pleasant and in no acute distress.  Skin is warm and dry. Color is normal.  HEENT is unremarkable.  Normocephalic/atraumatic. PERRL. Sclera are nonicteric. Neck is supple. No masses. No JVD. Lungs are clear. Cardiac exam shows a regular rate and rhythm. Abdomen is soft. Extremities are with trace edema. Gait and ROM are intact. No gross neurologic deficits noted.    LABORATORY DATA:  Lab Results  Component Value Date   WBC 8.7 06/19/2015   HGB 13.4 06/19/2015   HCT 38.0* 06/19/2015   PLT 286 06/19/2015   GLUCOSE 107* 06/19/2015   CHOL 157 07/05/2013   TRIG 46.0 07/05/2013   HDL 38.90* 07/05/2013   LDLCALC 109* 07/05/2013   ALT 16 06/19/2015   AST 20 06/19/2015   NA 138 06/19/2015   K 4.4 06/19/2015   CL 103 06/19/2015   CREATININE 1.09 06/19/2015   BUN 20 06/19/2015   CO2 22 06/19/2015   TSH 1.813 06/19/2015   PSA 1.28 06/19/2015   INR 1.11 08/11/2011        Assessment / Plan: 1. Coronary disease with prior stenting of LAD in 2000. Myoview Jan. 2015 was normal. He remains asymptomatic. We'll continue medical therapy.  2. Hypertension blood pressure is well controlled. He is interested in reducing his meds. Will reduce amlodipine as tolerated as long as BP remains controlled.  3. Hyperlipidemia.  4. History of vasovagal syncope.  5.  Anemia. Hgb normal on iron therapy  I will follow up in one year

## 2015-08-20 ENCOUNTER — Other Ambulatory Visit: Payer: Self-pay | Admitting: Cardiology

## 2015-08-20 NOTE — Telephone Encounter (Signed)
Rx has been sent to the pharmacy electronically. ° °

## 2015-09-09 ENCOUNTER — Encounter: Payer: Self-pay | Admitting: Emergency Medicine

## 2015-09-24 DIAGNOSIS — H25813 Combined forms of age-related cataract, bilateral: Secondary | ICD-10-CM | POA: Diagnosis not present

## 2015-11-11 DIAGNOSIS — H18412 Arcus senilis, left eye: Secondary | ICD-10-CM | POA: Diagnosis not present

## 2015-11-11 DIAGNOSIS — H18411 Arcus senilis, right eye: Secondary | ICD-10-CM | POA: Diagnosis not present

## 2015-11-11 DIAGNOSIS — H2511 Age-related nuclear cataract, right eye: Secondary | ICD-10-CM | POA: Diagnosis not present

## 2015-11-11 DIAGNOSIS — H2512 Age-related nuclear cataract, left eye: Secondary | ICD-10-CM | POA: Diagnosis not present

## 2015-11-11 DIAGNOSIS — H02839 Dermatochalasis of unspecified eye, unspecified eyelid: Secondary | ICD-10-CM | POA: Diagnosis not present

## 2015-11-22 ENCOUNTER — Emergency Department (HOSPITAL_COMMUNITY)
Admission: EM | Admit: 2015-11-22 | Discharge: 2015-11-22 | Disposition: A | Discharge status: Home / Self Care | Payer: Medicare Other | Attending: Emergency Medicine | Admitting: Emergency Medicine

## 2015-11-22 ENCOUNTER — Encounter (HOSPITAL_COMMUNITY): Payer: Self-pay | Admitting: Emergency Medicine

## 2015-11-22 DIAGNOSIS — Z79899 Other long term (current) drug therapy: Secondary | ICD-10-CM | POA: Diagnosis not present

## 2015-11-22 DIAGNOSIS — Z9889 Other specified postprocedural states: Secondary | ICD-10-CM | POA: Insufficient documentation

## 2015-11-22 DIAGNOSIS — Z7982 Long term (current) use of aspirin: Secondary | ICD-10-CM | POA: Diagnosis not present

## 2015-11-22 DIAGNOSIS — K921 Melena: Secondary | ICD-10-CM | POA: Diagnosis not present

## 2015-11-22 DIAGNOSIS — Z8669 Personal history of other diseases of the nervous system and sense organs: Secondary | ICD-10-CM | POA: Diagnosis not present

## 2015-11-22 DIAGNOSIS — E669 Obesity, unspecified: Secondary | ICD-10-CM | POA: Diagnosis not present

## 2015-11-22 DIAGNOSIS — K219 Gastro-esophageal reflux disease without esophagitis: Secondary | ICD-10-CM | POA: Insufficient documentation

## 2015-11-22 DIAGNOSIS — I252 Old myocardial infarction: Secondary | ICD-10-CM | POA: Insufficient documentation

## 2015-11-22 DIAGNOSIS — Z87891 Personal history of nicotine dependence: Secondary | ICD-10-CM | POA: Diagnosis not present

## 2015-11-22 DIAGNOSIS — R42 Dizziness and giddiness: Secondary | ICD-10-CM | POA: Diagnosis not present

## 2015-11-22 DIAGNOSIS — Z9861 Coronary angioplasty status: Secondary | ICD-10-CM | POA: Diagnosis not present

## 2015-11-22 DIAGNOSIS — Z87438 Personal history of other diseases of male genital organs: Secondary | ICD-10-CM | POA: Insufficient documentation

## 2015-11-22 DIAGNOSIS — I10 Essential (primary) hypertension: Secondary | ICD-10-CM | POA: Diagnosis not present

## 2015-11-22 DIAGNOSIS — R197 Diarrhea, unspecified: Secondary | ICD-10-CM | POA: Insufficient documentation

## 2015-11-22 DIAGNOSIS — I2510 Atherosclerotic heart disease of native coronary artery without angina pectoris: Secondary | ICD-10-CM | POA: Insufficient documentation

## 2015-11-22 LAB — COMPREHENSIVE METABOLIC PANEL
ALBUMIN: 3.9 g/dL (ref 3.5–5.0)
ALK PHOS: 56 U/L (ref 38–126)
ALT: 19 U/L (ref 17–63)
AST: 24 U/L (ref 15–41)
Anion gap: 10 (ref 5–15)
BILIRUBIN TOTAL: 0.9 mg/dL (ref 0.3–1.2)
BUN: 22 mg/dL — AB (ref 6–20)
CALCIUM: 9.2 mg/dL (ref 8.9–10.3)
CO2: 23 mmol/L (ref 22–32)
Chloride: 104 mmol/L (ref 101–111)
Creatinine, Ser: 1.2 mg/dL (ref 0.61–1.24)
GFR calc Af Amer: >60 mL/min (ref >60)
GFR calc non Af Amer: 52 mL/min — ABNORMAL LOW (ref >60)
GLUCOSE: 149 mg/dL — AB (ref 65–99)
Potassium: 3.8 mmol/L (ref 3.5–5.1)
SODIUM: 137 mmol/L (ref 135–145)
TOTAL PROTEIN: 7 g/dL (ref 6.5–8.1)

## 2015-11-22 LAB — TYPE AND SCREEN
ABO/RH(D): A POS
ANTIBODY SCREEN: NEGATIVE

## 2015-11-22 LAB — CBC
HCT: 38.1 % — ABNORMAL LOW (ref 39.0–52.0)
Hemoglobin: 12.9 g/dL — ABNORMAL LOW (ref 13.0–17.0)
MCH: 30.7 pg (ref 26.0–34.0)
MCHC: 33.9 g/dL (ref 30.0–36.0)
MCV: 90.7 fL (ref 78.0–100.0)
Platelets: 427 10*3/uL — ABNORMAL HIGH (ref 150–400)
RBC: 4.2 MIL/uL — ABNORMAL LOW (ref 4.22–5.81)
RDW: 16.1 % — AB (ref 11.5–15.5)
WBC: 11.9 10*3/uL — ABNORMAL HIGH (ref 4.0–10.5)

## 2015-11-22 LAB — POC OCCULT BLOOD, ED: Fecal Occult Bld: NEGATIVE

## 2015-11-22 LAB — ABO/RH: ABO/RH(D): A POS

## 2015-11-22 MED ORDER — SODIUM CHLORIDE 0.9 % IV BOLUS (SEPSIS)
1000.0000 mL | Freq: Once | INTRAVENOUS | Status: AC
  Administered 2015-11-22: 1000 mL via INTRAVENOUS

## 2015-11-22 NOTE — ED Notes (Signed)
C/o rectal bleeding x 2 hours.  States it is "in between bright red and dark colored."  Denies abd pain or any other symptoms.  Reports history of same x 2.

## 2015-11-22 NOTE — Discharge Instructions (Signed)
Please read and follow all provided instructions.  Your diagnoses today include:  1. Diarrhea, unspecified type     Tests performed today include:  Blood counts and electrolytes - low normal level of hemoglobin (12.9)  Blood tests to kidney function  Stool test for blood - negative  Urine test to look for infection and pregnancy (in women)  EKG - no worrisome changes  Vital signs. See below for your results today.   Medications prescribed:   None  Take any prescribed medications only as directed.  Home care instructions:   Follow any educational materials contained in this packet.  Follow-up instructions: Please follow-up with your primary care provider in the next 2 days for further evaluation of your symptoms, even if you are feeling better.     Return instructions:  SEEK IMMEDIATE MEDICAL ATTENTION IF:  The pain does not go away or becomes severe   A temperature above 101F develops   Repeated vomiting occurs (multiple episodes)   The pain becomes localized to portions of the abdomen. The right side could possibly be appendicitis. In an adult, the left lower portion of the abdomen could be colitis or diverticulitis.   Blood is being passed in stools or vomit (bright red or black tarry stools)   You develop chest pain, difficulty breathing, dizziness or fainting, or become confused, poorly responsive, or inconsolable (young children)  If you have any other emergent concerns regarding your health  Additional Information: Abdominal (belly) pain can be caused by many things. Your caregiver performed an examination and possibly ordered blood/urine tests and imaging (CT scan, x-rays, ultrasound). Many cases can be observed and treated at home after initial evaluation in the emergency department. Even though you are being discharged home, abdominal pain can be unpredictable. Therefore, you need a repeated exam if your pain does not resolve, returns, or worsens. Most  patients with abdominal pain don't have to be admitted to the hospital or have surgery, but serious problems like appendicitis and gallbladder attacks can start out as nonspecific pain. Many abdominal conditions cannot be diagnosed in one visit, so follow-up evaluations are very important.  Your vital signs today were: BP 152/67 mmHg   Pulse 80   Temp(Src) 97.8 F (36.6 C) (Oral)   Resp 11   SpO2 98% If your blood pressure (bp) was elevated above 135/85 this visit, please have this repeated by your doctor within one month. --------------

## 2015-11-22 NOTE — ED Notes (Signed)
Pt has been to restroom twice since arrival and states it is now a large amount of bleeding.

## 2015-11-22 NOTE — ED Provider Notes (Signed)
CSN: [id number]     Arrival date & time 11/22/15  0419 History   First MD Initiated Contact with Patient 11/22/15 [phone]     Chief Complaint  Patient presents with  . GI Bleeding     (Consider location/radiation/quality/duration/timing/severity/associated sxs/prior Treatment) HPI Comments: Patient with history of GI bleeding, colonoscopy 12/14 showing diverticulosis and polyps -- presents with complaint of red stools, lightheadedness. Patient states that he has been having some diarrhea overnight. He noted some red in the stool. He did not want to wait for symptoms to worsen before being seen. He denies any abdominal pain, nausea or vomiting. No fevers. Had one short-lived episode of 'vasovagal syncope' however he did not pass completely out. This has happened to him before. Denies chest pain or shortness of breath. Patient is taking a supplement that contains beets that is red in color. No bleeding elsewhere including of the gums, urine. No easy bruising reported. Patient is on aspirin 81 mg daily and no other blood thinners. The onset of this condition was acute. The course is intermittent. Aggravating factors: none. Alleviating factors: none.    The history is provided by medical records, the spouse and the patient.    Past Medical History  Diagnosis Date  . Hyperlipidemia   . Hypertension   . Coronary artery disease     a.  s/p stenting of the LAD in 2000;  b. Lexiscan Myoview (12/2013): No ischemia, EF 70%; normal study  . Obesity   . ED (erectile dysfunction)   . Allergic rhinitis   . Normal nuclear stress test 2008  . Syncope Jan 2013    felt to be vasovagal; had normal echo, carotids ok, 1st degree AV block with mention (but no tracing) of 2nd degree type I AV block while in [place]  . GERD (gastroesophageal reflux disease)   . Normal echocardiogram Jan 2013    EF of 55% and no wall motion abnormalities  . Carotid artery stenosis     12/2011:  50-69% on the left and 1-49% on the  right;  Carotid US (11/2013): Bilateral 40-59%; repeat 1 year  . Palpitations     Holter (11/2013): NSR, sinus brady, PVCs, rare blocked PAC, no sig arrhythmia  . Cataract   . Myocardial infarction ([place])   . Substance abuse    Past Surgical History  Procedure Laterality Date  . Cardiac catheterization  01/20/1999    EF 60%  . Tonsillectomy    . Appendectomy    . Doppler echocardiography  02/06/1998    EF 60%  . Cardiovascular stress test  04/19/2007    EF 74%  . Coronary stent placement  2000    LAD   Family History  Problem Relation Age of Onset  . Aneurysm Mother   . Stomach cancer Father   . Cancer Father   . Cancer Sister    Social History  Substance Use Topics  . Smoking status: Former Smoker -- 2.00 packs/day for 28 years    Types: Cigarettes    Quit date: 12/06/1968  . Smokeless tobacco: Never Used  . Alcohol Use: No    Review of Systems  Constitutional: Negative for fever.  HENT: Negative for rhinorrhea and sore throat.   Eyes: Negative for redness.  Respiratory: Negative for cough and shortness of breath.   Cardiovascular: Negative for chest pain.  Gastrointestinal: Positive for blood in stool (red stool). Negative for nausea, vomiting, abdominal pain and diarrhea.  Genitourinary: Negative for dysuria.  Musculoskeletal: Negative for  myalgias.  Skin: Negative for rash.  Neurological: Negative for headaches.    Allergies  Hctz  Home Medications   Prior to Admission medications   Medication Sig Start Date End Date Taking? Authorizing Provider  amLODipine (NORVASC) 5 MG tablet TAKE 1 TABLET BY MOUTH TWICE DAILY 08/20/15  Yes Peter M Martinique, MD  Ascorbic Acid (VITAMIN C) 1000 MG tablet Take 1,000 mg by mouth daily.     Yes Historical Provider, MD  aspirin 81 MG tablet Take 81 mg by mouth daily.   Yes Historical Provider, MD  b complex vitamins tablet Take 1 tablet by mouth daily.     Yes Historical Provider, MD  magnesium gluconate (MAGONATE) 500 MG tablet  Take 500 mg by mouth daily.     Yes Historical Provider, MD  Nattokinase 100 MG CAPS Take by mouth daily.     Yes Historical Provider, MD  omeprazole (PRILOSEC) 20 MG capsule Take 20 mg by mouth daily.   Yes Historical Provider, MD  vitamin E 400 UNIT capsule Take 400 Units by mouth daily.     Yes Historical Provider, MD  zinc gluconate 50 MG tablet Take 50 mg by mouth daily.     Yes Historical Provider, MD   BP 162/61 mmHg  Pulse 82  Temp(Src) 97.8 F (36.6 C) (Oral)  Resp 15  SpO2 100%   Physical Exam  Constitutional: He appears well-developed and well-nourished.  HENT:  Head: Normocephalic and atraumatic.  Eyes: Conjunctivae are normal. Right eye exhibits no discharge. Left eye exhibits no discharge.  Neck: Normal range of motion. Neck supple.  Cardiovascular: Normal rate, regular rhythm and normal heart sounds.   No murmur heard. Pulmonary/Chest: Effort normal and breath sounds normal. No respiratory distress. He has no wheezes. He has no rales.  Abdominal: Soft. Bowel sounds are normal. There is no tenderness. There is no rebound and no guarding.  Genitourinary:  Hemoccult performed by nursing prior to my exam.   Neurological: He is alert.  Skin: Skin is warm and dry.  Psychiatric: He has a normal mood and affect.  Nursing note and vitals reviewed.   ED Course  Procedures (including critical care time) Labs Review Labs Reviewed  COMPREHENSIVE METABOLIC PANEL - Abnormal; Notable for the following:    Glucose, Bld 149 (*)    BUN 22 (*)    GFR calc non Af Amer 52 (*)    All other components within normal limits  CBC - Abnormal; Notable for the following:    WBC 11.9 (*)    RBC 4.20 (*)    Hemoglobin 12.9 (*)    HCT 38.1 (*)    RDW 16.1 (*)    Platelets 427 (*)    All other components within normal limits  POC OCCULT BLOOD, ED  POC OCCULT BLOOD, ED  TYPE AND SCREEN  ABO/RH    Imaging Review No results found. I have personally reviewed and evaluated these  images and lab results as part of my medical decision-making.   EKG Interpretation   Date/Time:  Saturday November 22 2015 05:24:28 EST Ventricular Rate:  66 PR Interval:  204 QRS Duration: 99 QT Interval:  416 QTC Calculation: 436 R Axis:   70 Text Interpretation:  Sinus rhythm Abnormal R-wave progression, early  transition No significant change since last tracing Confirmed by Glynn Octave [phone]) on 11/22/2015 6:35:07 AM       Patient seen and examined. He appears well. Work-up reviewed. Discussed with Dr. Venora Maples who  will see.   Vital signs reviewed and are as follows: BP 162/61 mmHg  Pulse 82  Temp(Src) 97.8 F (36.6 C) (Oral)  Resp 15  SpO2 100%  Patient stable during ED stay. He feels well. He has been up ambulating without difficulty. No orthostasis. Patient wants to go home and appears well-developed so. Encourage primary care physician follow-up in the next 2-3 days for recheck. Patient to return to the emergency department immediately with lightheadedness, syncope, abdominal pain, increase of blood in stool. Patient wife verbalized understanding and agree with this plan.  MDM   Final diagnoses:  Diarrhea, unspecified type   Patient with diarrhea, history of GI bleeding -- thought to have seen blood in his stool however this may be the result of increased transit with beet supplement that he started taking 2 weeks ago. Hemoccult here is negative. Vital signs are stable. Hemoglobin is low normal at 12.9. No continued active bleeding or symptoms. Patient appears well. Ambulatory at baseline. Feel stable and safe for discharge to home at this point. Patient seems reliable to return if his symptoms or condition worsens.    Carlisle Cater, PA-C 11/22/15 0830  Jola Schmidt, MD 11/22/15 [phone]

## 2015-12-05 ENCOUNTER — Ambulatory Visit (INDEPENDENT_AMBULATORY_CARE_PROVIDER_SITE_OTHER): Payer: Medicare Other | Admitting: Family Medicine

## 2015-12-05 VITALS — BP 106/48 | HR 67 | Temp 97.9°F | Resp 16 | Ht 68.0 in | Wt 159.0 lb

## 2015-12-05 DIAGNOSIS — R110 Nausea: Secondary | ICD-10-CM

## 2015-12-05 DIAGNOSIS — R531 Weakness: Secondary | ICD-10-CM | POA: Diagnosis not present

## 2015-12-05 DIAGNOSIS — K529 Noninfective gastroenteritis and colitis, unspecified: Secondary | ICD-10-CM

## 2015-12-05 DIAGNOSIS — R42 Dizziness and giddiness: Secondary | ICD-10-CM

## 2015-12-05 DIAGNOSIS — E860 Dehydration: Secondary | ICD-10-CM | POA: Diagnosis not present

## 2015-12-05 LAB — POCT CBC
GRANULOCYTE PERCENT: 80.2 % — AB (ref 37–80)
HEMATOCRIT: 39.2 % — AB (ref 43.5–53.7)
HEMOGLOBIN: 13.7 g/dL — AB (ref 14.1–18.1)
Lymph, poc: 2.3 (ref 0.6–3.4)
MCH: 31.1 pg (ref 27–31.2)
MCHC: 34.9 g/dL (ref 31.8–35.4)
MCV: 89.3 fL (ref 80–97)
MID (cbc): 1.3 — AB (ref 0–0.9)
MPV: 6.9 fL (ref 0–99.8)
POC GRANULOCYTE: 14.3 — AB (ref 2–6.9)
POC LYMPH PERCENT: 12.7 %L (ref 10–50)
POC MID %: 7.1 % (ref 0–12)
Platelet Count, POC: 411 10*3/uL (ref 142–424)
RBC: 4.39 M/uL — AB (ref 4.69–6.13)
RDW, POC: 15.6 %
WBC: 17.8 10*3/uL — AB (ref 4.6–10.2)

## 2015-12-05 LAB — COMPREHENSIVE METABOLIC PANEL
ALT: 70 U/L — ABNORMAL HIGH (ref 9–46)
AST: 55 U/L — ABNORMAL HIGH (ref 10–35)
Albumin: 3.2 g/dL — ABNORMAL LOW (ref 3.6–5.1)
Alkaline Phosphatase: 45 U/L (ref 40–115)
BUN: 26 mg/dL — AB (ref 7–25)
CHLORIDE: 102 mmol/L (ref 98–110)
CO2: 23 mmol/L (ref 20–31)
CREATININE: 1.2 mg/dL — AB (ref 0.70–1.11)
Calcium: 8 mg/dL — ABNORMAL LOW (ref 8.6–10.3)
Glucose, Bld: 104 mg/dL — ABNORMAL HIGH (ref 65–99)
POTASSIUM: 5.1 mmol/L (ref 3.5–5.3)
SODIUM: 130 mmol/L — AB (ref 135–146)
Total Bilirubin: 0.6 mg/dL (ref 0.2–1.2)
Total Protein: 5.2 g/dL — ABNORMAL LOW (ref 6.1–8.1)

## 2015-12-05 LAB — LIPASE: Lipase: 12 U/L (ref 7–60)

## 2015-12-05 MED ORDER — CIPROFLOXACIN HCL 500 MG PO TABS: 500.0000 mg | ORAL_TABLET | Freq: Two times a day (BID) | ORAL | Status: DC

## 2015-12-05 MED ORDER — ONDANSETRON 4 MG PO TBDP: 4.0000 mg | ORAL_TABLET | Freq: Three times a day (TID) | ORAL | Status: DC | PRN

## 2015-12-05 MED ORDER — ONDANSETRON 4 MG PO TBDP
4.0000 mg | ORAL_TABLET | Freq: Once | ORAL | Status: AC
  Administered 2015-12-05: 4 mg via ORAL

## 2015-12-05 MED ORDER — METRONIDAZOLE 500 MG PO TABS: 500.0000 mg | ORAL_TABLET | Freq: Four times a day (QID) | ORAL | Status: DC

## 2015-12-05 NOTE — Patient Instructions (Signed)
Full Liquid Diet A full liquid diet may be used:   To help you transition from a clear liquid diet to a soft diet.   When your body is healing and can only tolerate foods that are easy to digest.  Before or after certain a procedure, test, or surgery (such as stomach or intestinal surgeries).   If you have trouble swallowing or chewing.  A full liquid diet includes fluids and foods that are liquid or will become liquid at room temperature. The full liquid diet gives you the proteins, fluids, salts, and minerals that you need for energy. If you continue this diet for more than 72 hours, talk to your health care provider about how many calories you need to consume. If you continue the diet for more than 5 days, talk to your health care provider about taking a multivitamin or a nutritional supplement. WHAT DO I NEED TO KNOW ABOUT A FULL LIQUID DIET?  You may have any liquid.  You may have any food that becomes a liquid at room temperature. The food is considered a liquid if it can be poured off a spoon at room temperature.  Drink one serving of citrus or vitamin C-enriched fruit juice daily. WHAT FOODS CAN I EAT? Grains Any grain food that can be pureed in soup (such as crackers, pasta, and rice). Hot cereal (such as farina or oatmeal) that has been blended. Talk to your health care provider or dietitian about these foods. Vegetables Pulp-free tomato or vegetable juice. Vegetables pureed in soup.  Fruits Fruit juice, including nectars and juices with pulp. Meats and Other Protein Sources Eggs in custard, eggnog mix, and eggs used in ice cream or pudding. Strained meats, like in baby food, may be allowed. Consult your health care provider.  Dairy Milk and milk-based beverages, including milk shakes and instant breakfast mixes. Smooth yogurt. Pureed cottage cheese. Avoid these foods if they are not well tolerated. Beverages All beverages, including liquid nutritional supplements. Ask  your health care provider if you can have carbonated beverages. They may not be well tolerated. Condiments Iodized salt, pepper, spices, and flavorings. Cocoa powder. Vinegar, ketchup, yellow mustard, smooth sauces (such as hollandaise, cheese sauce, or white sauce), and soy sauce. Sweets and Desserts Custard, smooth pudding. Flavored gelatin. Tapioca, junket. Plain ice cream, sherbet, fruit ices. Frozen ice pops, frozen fudge pops, pudding pops, and other frozen bars with cream. Syrups, including chocolate syrup. Sugar, honey, jelly.  Fats and Oils Margarine, butter, cream, sour cream, and oils. Other Broth and cream soups. Strained, broth-based soups. The items listed above may not be a complete list of recommended foods or beverages. Contact your dietitian for more options.  WHAT FOODS CAN I NOT EAT? Grains All breads. Grains are not allowed unless they are pureed into soup. Vegetables Vegetables are not allowed unless they are juiced, or cooked and pureed into soup. Fruits Fruits are not allowed unless they are juiced. Meats and Other Protein Sources Any meat or fish. Cooked or raw eggs. Nut butters.  Dairy Cheese.  Condiments Stone ground mustards. Fats and Oils Fats that are coarse or chunky. Sweets and Desserts Ice cream or other frozen desserts that have any solids in them or on top, such as nuts, chocolate chips, and pieces of cookies. Cakes. Cookies. Candy. Others Soups with chunks or pieces in them. The items listed above may not be a complete list of foods and beverages to avoid. Contact your dietitian for more information.   This information  is not intended to replace advice given to you by your health care provider. Make sure you discuss any questions you have with your health care provider.   Document Released: 11/22/2005 Document Revised: 11/27/2013 Document Reviewed: 09/27/2013 Elsevier Interactive Patient Education 2016 [employer].  Diverticulitis Diverticulitis is inflammation or infection of small pouches in your colon that form when you have a condition called diverticulosis. The pouches in your colon are called diverticula. Your colon, or large intestine, is where water is absorbed and stool is formed. Complications of diverticulitis can include:  Bleeding.  Severe infection.  Severe pain.  Perforation of your colon.  Obstruction of your colon. CAUSES  Diverticulitis is caused by bacteria. Diverticulitis happens when stool becomes trapped in diverticula. This allows bacteria to grow in the diverticula, which can lead to inflammation and infection. RISK FACTORS People with diverticulosis are at risk for diverticulitis. Eating a diet that does not include enough fiber from fruits and vegetables may make diverticulitis more likely to develop. SYMPTOMS  Symptoms of diverticulitis may include:  Abdominal pain and tenderness. The pain is normally located on the left side of the abdomen, but may occur in other areas.  Fever and chills.  Bloating.  Cramping.  Nausea.  Vomiting.  Constipation.  Diarrhea.  Blood in your stool. DIAGNOSIS  Your health care provider will ask you about your medical history and do a physical exam. You may need to have tests done because many medical conditions can cause the same symptoms as diverticulitis. Tests may include:  Blood tests.  Urine tests.  Imaging tests of the abdomen, including X-rays and CT scans. When your condition is under control, your health care provider may recommend that you have a colonoscopy. A colonoscopy can show how severe your diverticula are and whether something else is causing your symptoms. TREATMENT  Most cases of diverticulitis are mild and can be treated at home. Treatment may include:  Taking over-the-counter pain medicines.  Following a clear liquid diet.  Taking antibiotic medicines by mouth for 7-10 days. More severe  cases may be treated at a hospital. Treatment may include:  Not eating or drinking.  Taking prescription pain medicine.  Receiving antibiotic medicines through an IV tube.  Receiving fluids and nutrition through an IV tube.  Surgery. HOME CARE INSTRUCTIONS   Follow your health care provider's instructions carefully.  Follow a full liquid diet or other diet as directed by your health care provider. After your symptoms improve, your health care provider may tell you to change your diet. He or she may recommend you eat a high-fiber diet. Fruits and vegetables are good sources of fiber. Fiber makes it easier to pass stool.  Take fiber supplements or probiotics as directed by your health care provider.  Only take medicines as directed by your health care provider.  Keep all your follow-up appointments. SEEK MEDICAL CARE IF:   Your pain does not improve.  You have a hard time eating food.  Your bowel movements do not return to normal. SEEK IMMEDIATE MEDICAL CARE IF:   Your pain becomes worse.  Your symptoms do not get better.  Your symptoms suddenly get worse.  You have a fever.  You have repeated vomiting.  You have bloody or black, tarry stools. MAKE SURE YOU:   Understand these instructions.  Will watch your condition.  Will get help right away if you are not doing well or get worse.   This information is not intended to replace advice given  to you by your health care provider. Make sure you discuss any questions you have with your health care provider.   Document Released: 09/01/2005 Document Revised: 11/27/2013 Document Reviewed: 10/17/2013 Elsevier Interactive Patient Education 2016 [employer] Low-Fiber Diet Fiber is found in fruits, vegetables, and whole grains. A low-fiber diet restricts fibrous foods that are not digested in the small intestine. A diet containing about 10-15 grams of fiber per day is considered low fiber. Low-fiber diets may be used  to:  Promote healing and rest the bowel during intestinal flare-ups.  Prevent blockage of a partially obstructed or narrowed gastrointestinal tract.  Reduce fecal weight and volume.  Slow the movement of feces. You may be on a low-fiber diet as a transitional diet following surgery, after an injury (trauma), or because of a short (acute) or lifelong (chronic) illness. Your health care provider will determine the length of time you need to stay on this diet.  WHAT DO I NEED TO KNOW ABOUT A LOW-FIBER DIET? Always check the fiber content on the packaging's Nutrition Facts label, especially on foods from the grains list. Ask your dietitian if you have questions about specific foods that are related to your condition, especially if the food is not listed below. In general, a low-fiber food will have less than 2 g of fiber. WHAT FOODS CAN I EAT? Grains All breads and crackers made with white flour. Sweet rolls, doughnuts, waffles, pancakes, [place] toast, bagels. Pretzels, Melba toast, zwieback. Well-cooked cereals, such as cornmeal, farina, or cream cereals. Dry cereals that do not contain whole grains, fruit, or nuts, such as refined corn, wheat, rice, and oat cereals. Potatoes prepared any way without skins, plain pastas and noodles, refined white rice. Use white flour for baking and making sauces. Use allowed list of grains for casseroles, dumplings, and puddings.  Vegetables Strained tomato and vegetable juices. Fresh lettuce, cucumber, spinach. Well-cooked (no skin or pulp) or canned vegetables, such as asparagus, bean sprouts, beets, carrots, green beans, mushrooms, potatoes, pumpkin, spinach, yellow squash, tomato sauce/puree, turnips, yams, and zucchini. Keep servings limited to  cup.  Fruits All fruit juices except prune juice. Cooked or canned fruits without skin and seeds, such as applesauce, apricots, cherries, fruit cocktail, grapefruit, grapes, mandarin oranges, melons, peaches, pears,  pineapple, and plums. Fresh fruits without skin, such as apricots, avocados, bananas, melons, pineapple, nectarines, and peaches. Keep servings limited to  cup or 1 piece.  Meat and Other Protein Sources Ground or well-cooked tender beef, ham, veal, lamb, pork, or poultry. Eggs, plain cheese. Fish, oysters, shrimp, lobster, and other seafood. Liver, organ meats. Smooth nut butters. Dairy All milk products and alternative dairy substitutes, such as soy, rice, almond, and coconut, not containing added whole nuts, seeds, or added fruit. Beverages Decaf coffee, fruit, and vegetable juices or smoothies (small amounts, with no pulp or skins, and with fruits from allowed list), sports drinks, herbal tea. Condiments Ketchup, mustard, vinegar, cream sauce, cheese sauce, cocoa powder. Spices in moderation, such as allspice, basil, bay leaves, celery powder or leaves, cinnamon, cumin powder, curry powder, ginger, mace, marjoram, onion or garlic powder, oregano, paprika, parsley flakes, ground pepper, rosemary, sage, savory, tarragon, thyme, and turmeric. Sweets and Desserts Plain cakes and cookies, pie made with allowed fruit, pudding, custard, cream pie. Gelatin, fruit, ice, sherbet, frozen ice pops. Ice cream, ice milk without nuts. Plain hard candy, honey, jelly, molasses, syrup, sugar, chocolate syrup, gumdrops, marshmallows. Limit overall sugar intake.  Fats and Oil Margarine, butter, cream, mayonnaise,  salad oils, plain salad dressings made from allowed foods. Choose healthy fats such as olive oil, canola oil, and omega-3 fatty acids (such as found in salmon or tuna) when possible.  Other Bouillon, broth, or cream soups made from allowed foods. Any strained soup. Casseroles or mixed dishes made with allowed foods. The items listed above may not be a complete list of recommended foods or beverages. Contact your dietitian for more options.  WHAT FOODS ARE NOT RECOMMENDED? Grains All whole wheat and  whole grain breads and crackers. Multigrains, rye, bran seeds, nuts, or coconut. Cereals containing whole grains, multigrains, bran, coconut, nuts, raisins. Cooked or dry oatmeal, steel-cut oats. Coarse wheat cereals, granola. Cereals advertised as high fiber. Potato skins. Whole grain pasta, wild or brown rice. Popcorn. Coconut flour. Bran, buckwheat, corn bread, multigrains, rye, wheat germ.  Vegetables Fresh, cooked or canned vegetables, such as artichokes, asparagus, beet greens, broccoli, Brussels sprouts, cabbage, celery, cauliflower, corn, eggplant, kale, legumes or beans, okra, peas, and tomatoes. Avoid large servings of any vegetables, especially raw vegetables.  Fruits Fresh fruits, such as apples with or without skin, berries, cherries, figs, grapes, grapefruit, guavas, kiwis, mangoes, oranges, papayas, pears, persimmons, pineapple, and pomegranate. Prune juice and juices with pulp, stewed or dried prunes. Dried fruits, dates, raisins. Fruit seeds or skins. Avoid large servings of all fresh fruits. Meats and Other Protein Sources Tough, fibrous meats with gristle. Chunky nut butter. Cheese made with seeds, nuts, or other foods not recommended. Nuts, seeds, legumes (beans, including baked beans), dried peas, beans, lentils.  Dairy Yogurt or cheese that contains nuts, seeds, or added fruit.  Beverages Fruit juices with high pulp, prune juice. Caffeinated coffee and teas.  Condiments Coconut, maple syrup, pickles, olives. Sweets and Desserts Desserts, cookies, or candies that contain nuts or coconut, chunky peanut butter, dried fruits. Jams, preserves with seeds, marmalade. Large amounts of sugar and sweets. Any other dessert made with fruits from the not recommended list.  Other Soups made from vegetables that are not recommended or that contain other foods not recommended.  The items listed above may not be a complete list of foods and beverages to avoid. Contact your dietitian for more  information.   This information is not intended to replace advice given to you by your health care provider. Make sure you discuss any questions you have with your health care provider.   Document Released: 05/14/2002 Document Revised: 11/27/2013 Document Reviewed: 10/15/2013 Elsevier Interactive Patient Education 2016 [place] Choices to Help Relieve Diarrhea, Adult When you have diarrhea, the foods you eat and your eating habits are very important. Choosing the right foods and drinks can help relieve diarrhea. Also, because diarrhea can last up to 7 days, you need to replace lost fluids and electrolytes (such as sodium, potassium, and chloride) in order to help prevent dehydration.  WHAT GENERAL GUIDELINES DO I NEED TO FOLLOW?  Slowly drink 1 cup (8 oz) of fluid for each episode of diarrhea. If you are getting enough fluid, your urine will be clear or pale yellow.  Eat starchy foods. Some good choices include white rice, white toast, pasta, low-fiber cereal, baked potatoes (without the skin), saltine crackers, and bagels.  Avoid large servings of any cooked vegetables.  Limit fruit to two servings per day. A serving is  cup or 1 small piece.  Choose foods with less than 2 g of fiber per serving.  Limit fats to less than 8 tsp (38 g) per day.  Avoid fried  foods.  Eat foods that have probiotics in them. Probiotics can be found in certain dairy products.  Avoid foods and beverages that may increase the speed at which food moves through the stomach and intestines (gastrointestinal tract). Things to avoid include:  High-fiber foods, such as dried fruit, raw fruits and vegetables, nuts, seeds, and whole grain foods.  Spicy foods and high-fat foods.  Foods and beverages sweetened with high-fructose corn syrup, honey, or sugar alcohols such as xylitol, sorbitol, and mannitol. WHAT FOODS ARE RECOMMENDED? Grains White rice. White, [place], or pita breads (fresh or toasted),  including plain rolls, buns, or bagels. White pasta. Saltine, soda, or graham crackers. Pretzels. Low-fiber cereal. Cooked cereals made with water (such as cornmeal, farina, or cream cereals). Plain muffins. Matzo. Melba toast. Zwieback.  Vegetables Potatoes (without the skin). Strained tomato and vegetable juices. Most well-cooked and canned vegetables without seeds. Tender lettuce. Fruits Cooked or canned applesauce, apricots, cherries, fruit cocktail, grapefruit, peaches, pears, or plums. Fresh bananas, apples without skin, cherries, grapes, cantaloupe, grapefruit, peaches, oranges, or plums.  Meat and Other Protein Products Baked or boiled chicken. Eggs. Tofu. Fish. Seafood. Smooth peanut butter. Ground or well-cooked tender beef, ham, veal, lamb, pork, or poultry.  Dairy Plain yogurt, kefir, and unsweetened liquid yogurt. Lactose-free milk, buttermilk, or soy milk. Plain hard cheese. Beverages Sport drinks. Clear broths. Diluted fruit juices (except prune). Regular, caffeine-free sodas such as ginger ale. Water. Decaffeinated teas. Oral rehydration solutions. Sugar-free beverages not sweetened with sugar alcohols. Other Bouillon, broth, or soups made from recommended foods.  The items listed above may not be a complete list of recommended foods or beverages. Contact your dietitian for more options. WHAT FOODS ARE NOT RECOMMENDED? Grains Whole grain, whole wheat, bran, or rye breads, rolls, pastas, crackers, and cereals. Wild or brown rice. Cereals that contain more than 2 g of fiber per serving. Corn tortillas or taco shells. Cooked or dry oatmeal. Granola. Popcorn. Vegetables Raw vegetables. Cabbage, broccoli, Brussels sprouts, artichokes, baked beans, beet greens, corn, kale, legumes, peas, sweet potatoes, and yams. Potato skins. Cooked spinach and cabbage. Fruits Dried fruit, including raisins and dates. Raw fruits. Stewed or dried prunes. Fresh apples with skin, apricots, mangoes, pears,  raspberries, and strawberries.  Meat and Other Protein Products Chunky peanut butter. Nuts and seeds. Beans and lentils. Berniece Salines.  Dairy High-fat cheeses. Milk, chocolate milk, and beverages made with milk, such as milk shakes. Cream. Ice cream. Sweets and Desserts Sweet rolls, doughnuts, and sweet breads. Pancakes and waffles. Fats and Oils Butter. Cream sauces. Margarine. Salad oils. Plain salad dressings. Olives. Avocados.  Beverages Caffeinated beverages (such as coffee, tea, soda, or energy drinks). Alcoholic beverages. Fruit juices with pulp. Prune juice. Soft drinks sweetened with high-fructose corn syrup or sugar alcohols. Other Coconut. Hot sauce. Chili powder. Mayonnaise. Gravy. Cream-based or milk-based soups.  The items listed above may not be a complete list of foods and beverages to avoid. Contact your dietitian for more information. WHAT SHOULD I DO IF I BECOME DEHYDRATED? Diarrhea can sometimes lead to dehydration. Signs of dehydration include dark urine and dry mouth and skin. If you think you are dehydrated, you should rehydrate with an oral rehydration solution. These solutions can be purchased at pharmacies, retail stores, or online.  Drink -1 cup (120-240 mL) of oral rehydration solution each time you have an episode of diarrhea. If drinking this amount makes your diarrhea worse, try drinking smaller amounts more often. For example, drink 1-3 tsp (5-15 mL) every 5-10  minutes.  A general rule for staying hydrated is to drink 1-2 L of fluid per day. Talk to your health care provider about the specific amount you should be drinking each day. Drink enough fluids to keep your urine clear or pale yellow.   This information is not intended to replace advice given to you by your health care provider. Make sure you discuss any questions you have with your health care provider.   Document Released: 02/12/2004 Document Revised: 12/13/2014 Document Reviewed: 10/15/2013 Elsevier  Interactive Patient Education [employer].

## 2015-12-05 NOTE — Progress Notes (Signed)
Subjective:    Patient ID: Patrick Rodriguez, male    DOB: [birth year], 81 y.o.   MRN: [id number] By signing my name below, I, [name], attest that this documentation has been prepared under the direction and in the presence of Delman Cheadle, MD.  Electronically Signed: [name], Medical Scribe. 12/05/2015. 12:34 PM.  Chief Complaint  Patient presents with  . Diarrhea    x 10 days  . Fatigue    HPI HPI Comments: Patrick Rodriguez is a 81 y.o. male who presents to the [facility] complaining of severe, intermittent diarrhea that started 2 weeks ago. He was in the ER 2 weeks ago with concerns about GI bleed. His colonoscopy was December 2014 by Dr. Hilarie Fredrickson. He was hemoccult negative. Mildly dehydrated on CMP. WBC 11.9, HGB 12.9. Assumed to be temporary viral illness. No imaging needed. History of polyps and diverticulosis.  Patient notes that he has been feeling fatigued since he was discharged from the ED 2 weeks ago; he began eating again the following day. He began having diarrhea again 2 days ago. This lasted about 48 hours; he took some Imodium which seemed to relieve the diarrhea. He reports having associated mild nausea and mild abdominal pain. He has had one episode of vomiting and reports losing about 5 pounds over the past few weeks. He has also tried Pepto-Bismol which provided some relief. Patient denies fever, chills, abdominal distention, and blood in his stool; his stools have appeared normal. He also denies history of diverticulitis and recent antibiotic use. He has been urinating normally. His PSHx includes appendectomy when he was 81 years old. Patient also notes having a history of rectal bleeding; this has resolved, but the cause was unknown. He feels his electrolytes may be off and would like his electrolytes and hemoglobin checked.  Past Medical History  Diagnosis Date  . Hyperlipidemia   . Hypertension   . Coronary artery disease     a.  s/p stenting of the LAD in  2000;  b. Lexiscan Myoview (12/2013): No ischemia, EF 70%; normal study  . Obesity   . ED (erectile dysfunction)   . Allergic rhinitis   . Normal nuclear stress test 2008  . Syncope Jan 2013    felt to be vasovagal; had normal echo, carotids ok, 1st degree AV block with mention (but no tracing) of 2nd degree type I AV block while in [place]  . GERD (gastroesophageal reflux disease)   . Normal echocardiogram Jan 2013    EF of 55% and no wall motion abnormalities  . Carotid artery stenosis     12/2011:  50-69% on the left and 1-49% on the right;  Carotid US (11/2013): Bilateral 40-59%; repeat 1 year  . Palpitations     Holter (11/2013): NSR, sinus brady, PVCs, rare blocked PAC, no sig arrhythmia  . Cataract   . Myocardial infarction ([place])   . Substance abuse    Past Surgical History  Procedure Laterality Date  . Cardiac catheterization  01/20/1999    EF 60%  . Tonsillectomy    . Appendectomy    . Doppler echocardiography  02/06/1998    EF 60%  . Cardiovascular stress test  04/19/2007    EF 74%  . Coronary stent placement  2000    LAD   Current Outpatient Prescriptions on File Prior to Visit  Medication Sig Dispense Refill  . amLODipine (NORVASC) 5 MG tablet TAKE 1 TABLET BY MOUTH TWICE DAILY 60  tablet 6  . Ascorbic Acid (VITAMIN C) 1000 MG tablet Take 1,000 mg by mouth daily.      Marland Kitchen aspirin 81 MG tablet Take 81 mg by mouth daily.    Marland Kitchen b complex vitamins tablet Take 1 tablet by mouth daily.      . vitamin E 400 UNIT capsule Take 400 Units by mouth daily.      Marland Kitchen zinc gluconate 50 MG tablet Take 50 mg by mouth daily.      . magnesium gluconate (MAGONATE) 500 MG tablet Take 500 mg by mouth daily.      . Nattokinase 100 MG CAPS Take by mouth daily.      Marland Kitchen omeprazole (PRILOSEC) 20 MG capsule Take 20 mg by mouth daily.    . [DISCONTINUED] losartan-hydrochlorothiazide (HYZAAR) 100-25 MG per tablet Take 1 tablet by mouth daily. 30 tablet 5   No current facility-administered medications on file  prior to visit.   Allergies  Allergen Reactions  . Hctz [Hydrochlorothiazide]     Has had hyponatremia   Family History  Problem Relation Age of Onset  . Aneurysm Mother   . Stomach cancer Father   . Cancer Father   . Cancer Sister    Social History   Social History  . Marital Status: Married    Spouse Name: N/A  . Number of Children: Y  . Years of Education: N/A   Occupational History  . traveling paster    Social History Main Topics  . Smoking status: Former Smoker -- 2.00 packs/day for 28 years    Types: Cigarettes    Quit date: 12/06/1968  . Smokeless tobacco: Never Used  . Alcohol Use: No  . Drug Use: No  . Sexual Activity: Not Asked   Other Topics Concern  . None   Social History Narrative     Review of Systems  Constitutional: Positive for activity change, appetite change and fatigue. Negative for fever, chills and diaphoresis.  Cardiovascular: Negative for leg swelling.  Gastrointestinal: Positive for nausea, vomiting, abdominal pain and diarrhea. Negative for constipation, blood in stool, abdominal distention and anal bleeding.  Genitourinary: Positive for decreased urine volume. Negative for dysuria, urgency, frequency, flank pain and difficulty urinating.  Neurological: Positive for weakness and light-headedness. Negative for dizziness and syncope.  Hematological: Negative for adenopathy. Bruises/bleeds easily.  Psychiatric/Behavioral: Positive for sleep disturbance.       Objective:  BP 106/48 mmHg  Pulse 67  Temp(Src) 97.9 F (36.6 C)  Resp 16  Ht 5\' 8"  (1.727 m)  Wt 159 lb (72.122 kg)  BMI 24.18 kg/m2  SpO2 98%  Physical Exam  Constitutional: He is oriented to person, place, and time. He appears well-developed and well-nourished. No distress.  HENT:  Head: Normocephalic and atraumatic.  Mouth/Throat: Oropharynx is clear and moist. No oropharyngeal exudate.  Eyes: Pupils are equal, round, and reactive to light.  Neck: Neck supple.    Cardiovascular: Normal rate.   Pulmonary/Chest: Effort normal.  Abdominal: Bowel sounds are increased. There is no hepatosplenomegaly. There is generalized tenderness. There is guarding. There is no rebound, no tenderness at McBurney's point and negative Murphy's sign.  Hyperactive bowel sounds. Diffuse tenderness. No referred pain.  Musculoskeletal: He exhibits no edema.  Neurological: He is alert and oriented to person, place, and time. No cranial nerve deficit.  Skin: Skin is warm and dry. No rash noted.  Psychiatric: He has a normal mood and affect. His behavior is normal.  Nursing note and vitals reviewed.  Orthostatics negative     Results for orders placed or performed in visit on 12/05/15  Comprehensive metabolic panel  Result Value Ref Range   Sodium 130 (L) 135 - 146 mmol/L   Potassium 5.1 3.5 - 5.3 mmol/L   Chloride 102 98 - 110 mmol/L   CO2 23 20 - 31 mmol/L   Glucose, Bld 104 (H) 65 - 99 mg/dL   BUN 26 (H) 7 - 25 mg/dL   Creat 1.20 (H) 0.70 - 1.11 mg/dL   Total Bilirubin 0.6 0.2 - 1.2 mg/dL   Alkaline Phosphatase 45 40 - 115 U/L   AST 55 (H) 10 - 35 U/L   ALT 70 (H) 9 - 46 U/L   Total Protein 5.2 (L) 6.1 - 8.1 g/dL   Albumin 3.2 (L) 3.6 - 5.1 g/dL   Calcium 8.0 (L) 8.6 - 10.3 mg/dL  Lipase  Result Value Ref Range   Lipase 12 7 - 60 U/L  POCT CBC  Result Value Ref Range   WBC 17.8 (A) 4.6 - 10.2 K/uL   Lymph, poc 2.3 0.6 - 3.4   POC LYMPH PERCENT 12.7 10 - 50 %L   MID (cbc) 1.3 (A) 0 - 0.9   POC MID % 7.1 0 - 12 %M   POC Granulocyte 14.3 (A) 2 - 6.9   Granulocyte percent 80.2 (A) 37 - 80 %G   RBC 4.39 (A) 4.69 - 6.13 M/uL   Hemoglobin 13.7 (A) 14.1 - 18.1 g/dL   HCT, POC 39.2 (A) 43.5 - 53.7 %   MCV 89.3 80 - 97 fL   MCH, POC 31.1 27 - 31.2 pg   MCHC 34.9 31.8 - 35.4 g/dL   RDW, POC 15.6 %   Platelet Count, POC 411 142 - 424 K/uL   MPV 6.9 0 - 99.8 fL    Assessment & Plan:   1. Gastroenteritis, acute   2. Dehydration   3. Weakness   4.  Lightheaded   5. Nausea without vomiting   Sxs did mildly improve after 1L NS IVF today. Will cover for diverticulosis though could be be infectious gastroenteritis/colitis.  Pt does not have any peritoneal signs so will hold off on imaging at this point. Collect stool studies prior to starting antibiotics.  No more imodium. Liquid diet x 1-2d, then gradually advance as tolerated. Recheck in 2-3d, sooner if worse.  Orders Placed This Encounter  Procedures  . Comprehensive metabolic panel  . Lipase  . Gastrointestinal Pathogen Panel PCR  . Fecal lactoferrin  . Orthostatic vital signs  . POCT CBC  . IFOBT POC (occult bld, rslt in office)  . Insert peripheral IV    Meds ordered this encounter  Medications  . ondansetron (ZOFRAN-ODT) disintegrating tablet 4 mg    Sig:   . ciprofloxacin (CIPRO) 500 MG tablet    Sig: Take 1 tablet (500 mg total) by mouth 2 (two) times daily.    Dispense:  14 tablet    Refill:  0  . metroNIDAZOLE (FLAGYL) 500 MG tablet    Sig: Take 1 tablet (500 mg total) by mouth 4 (four) times daily.    Dispense:  28 tablet    Refill:  0  . ondansetron (ZOFRAN ODT) 4 MG disintegrating tablet    Sig: Take 1 tablet (4 mg total) by mouth every 8 (eight) hours as needed for nausea or vomiting.    Dispense:  20 tablet    Refill:  0    I personally  performed the services described in this documentation, which was scribed in my presence. The recorded information has been reviewed and considered, and addended by me as needed.  Delman Cheadle, MD MPH

## 2015-12-06 DIAGNOSIS — R110 Nausea: Secondary | ICD-10-CM | POA: Diagnosis not present

## 2015-12-06 DIAGNOSIS — R42 Dizziness and giddiness: Secondary | ICD-10-CM | POA: Diagnosis not present

## 2015-12-06 DIAGNOSIS — R531 Weakness: Secondary | ICD-10-CM | POA: Diagnosis not present

## 2015-12-06 DIAGNOSIS — E860 Dehydration: Secondary | ICD-10-CM | POA: Diagnosis not present

## 2015-12-06 DIAGNOSIS — K529 Noninfective gastroenteritis and colitis, unspecified: Secondary | ICD-10-CM | POA: Diagnosis not present

## 2015-12-06 LAB — IFOBT (OCCULT BLOOD): IMMUNOLOGICAL FECAL OCCULT BLOOD TEST: POSITIVE

## 2015-12-07 LAB — FECAL LACTOFERRIN, QUANT: LACTOFERRIN: POSITIVE

## 2015-12-09 ENCOUNTER — Telehealth: Payer: Self-pay | Admitting: Family Medicine

## 2015-12-09 ENCOUNTER — Ambulatory Visit (INDEPENDENT_AMBULATORY_CARE_PROVIDER_SITE_OTHER): Payer: Medicare Other | Admitting: Emergency Medicine

## 2015-12-09 VITALS — BP 147/70 | HR 83 | Temp 97.9°F | Resp 18 | Ht 68.0 in | Wt 162.0 lb

## 2015-12-09 DIAGNOSIS — K529 Noninfective gastroenteritis and colitis, unspecified: Secondary | ICD-10-CM

## 2015-12-09 LAB — POCT CBC
GRANULOCYTE PERCENT: 70.4 % (ref 37–80)
HEMATOCRIT: 37.1 % — AB (ref 43.5–53.7)
Hemoglobin: 13.2 g/dL — AB (ref 14.1–18.1)
Lymph, poc: 3.4 (ref 0.6–3.4)
MCH: 31.7 pg — AB (ref 27–31.2)
MCHC: 35.6 g/dL — AB (ref 31.8–35.4)
MCV: 88.9 fL (ref 80–97)
MID (CBC): 0.6 (ref 0–0.9)
MPV: 7.1 fL (ref 0–99.8)
POC GRANULOCYTE: 9.7 — AB (ref 2–6.9)
POC LYMPH %: 24.9 % (ref 10–50)
POC MID %: 4.7 % (ref 0–12)
Platelet Count, POC: 402 10*3/uL (ref 142–424)
RBC: 4.17 M/uL — AB (ref 4.69–6.13)
RDW, POC: 15.4 %
WBC: 13.8 10*3/uL — AB (ref 4.6–10.2)

## 2015-12-09 LAB — COMPREHENSIVE METABOLIC PANEL
ALBUMIN: 3.2 g/dL — AB (ref 3.6–5.1)
ALT: 34 U/L (ref 9–46)
AST: 41 U/L — ABNORMAL HIGH (ref 10–35)
Alkaline Phosphatase: 35 U/L — ABNORMAL LOW (ref 40–115)
BILIRUBIN TOTAL: 0.4 mg/dL (ref 0.2–1.2)
BUN: 11 mg/dL (ref 7–25)
CALCIUM: 8.2 mg/dL — AB (ref 8.6–10.3)
CO2: 25 mmol/L (ref 20–31)
Chloride: 103 mmol/L (ref 98–110)
Creat: 1.15 mg/dL — ABNORMAL HIGH (ref 0.70–1.11)
GLUCOSE: 136 mg/dL — AB (ref 65–99)
POTASSIUM: 4.4 mmol/L (ref 3.5–5.3)
Sodium: 135 mmol/L (ref 135–146)
Total Protein: 4.7 g/dL — ABNORMAL LOW (ref 6.1–8.1)

## 2015-12-09 LAB — GASTROINTESTINAL PATHOGEN PANEL PCR
C. DIFFICILE TOX A/B, PCR: NEGATIVE
CAMPYLOBACTER, PCR: NEGATIVE
CRYPTOSPORIDIUM, PCR: NEGATIVE
E COLI (STEC) STX1/STX2, PCR: NEGATIVE
E coli (ETEC) LT/ST PCR: NEGATIVE
E coli 0157, PCR: NEGATIVE
GIARDIA LAMBLIA, PCR: NEGATIVE
NOROVIRUS, PCR: NEGATIVE
ROTAVIRUS, PCR: POSITIVE — AB
Salmonella, PCR: NEGATIVE
Shigella, PCR: NEGATIVE

## 2015-12-09 NOTE — Telephone Encounter (Signed)
Received a call from the on call line about his recent stool culture- he is positive for rotavirus.  He was seen today and note indicates that he is doing much better.  Will not disturb pt tonight but will ask Dr. Brigitte Pulse to follow-up with him in the morning

## 2015-12-09 NOTE — Progress Notes (Signed)
Subjective:  Patient ID: Patrick Rodriguez, male    DOB: [birth year]  Age: 82 y.o. MRN: [id number]  CC: Other   HPI NAZAIR GRODIN presents   He was seen by Dr. Manuella Ghazi recently for diarrhea. She there is concern about him having an infectious diarrhea. He's been taking Cipro and Flagyl with rapid improvement in his symptoms. He had 3 loose stools yesterday denies an nausea or vomiting. He denies any fever or chills. He was concerned because he had some dark stools but they've been heme-negative  History Tomio has a past medical history of Hyperlipidemia; Hypertension; Coronary artery disease; Obesity; ED (erectile dysfunction); Allergic rhinitis; Normal nuclear stress test (2008); Syncope (Jan 2013); GERD (gastroesophageal reflux disease); Normal echocardiogram (Jan 2013); Carotid artery stenosis; Palpitations; Cataract; Myocardial infarction [facility]); and Substance abuse.   He has past surgical history that includes Cardiac catheterization (01/20/1999); Tonsillectomy; Appendectomy; doppler echocardiography (02/06/1998); Cardiovascular stress test (04/19/2007); and Coronary stent placement (2000).   His  family history includes Aneurysm in his mother; Cancer in his father and sister; Stomach cancer in his father.  He   reports that he quit smoking about 47 years ago. His smoking use included Cigarettes. He has a 56 pack-year smoking history. He has never used smokeless tobacco. He reports that he does not drink alcohol or use illicit drugs.  Outpatient Prescriptions Prior to Visit  Medication Sig Dispense Refill  . amLODipine (NORVASC) 5 MG tablet TAKE 1 TABLET BY MOUTH TWICE DAILY 60 tablet 6  . Ascorbic Acid (VITAMIN C) 1000 MG tablet Take 1,000 mg by mouth daily.      Marland Kitchen aspirin 81 MG tablet Take 81 mg by mouth daily.    Marland Kitchen b complex vitamins tablet Take 1 tablet by mouth daily.      . ciprofloxacin (CIPRO) 500 MG tablet Take 1 tablet (500 mg total) by mouth 2 (two) times daily. 14 tablet 0  .  magnesium gluconate (MAGONATE) 500 MG tablet Take 500 mg by mouth daily.      . metroNIDAZOLE (FLAGYL) 500 MG tablet Take 1 tablet (500 mg total) by mouth 4 (four) times daily. 28 tablet 0  . Nattokinase 100 MG CAPS Take by mouth daily.      Marland Kitchen omeprazole (PRILOSEC) 20 MG capsule Take 20 mg by mouth daily.    . ondansetron (ZOFRAN ODT) 4 MG disintegrating tablet Take 1 tablet (4 mg total) by mouth every 8 (eight) hours as needed for nausea or vomiting. 20 tablet 0  . vitamin E 400 UNIT capsule Take 400 Units by mouth daily.      Marland Kitchen zinc gluconate 50 MG tablet Take 50 mg by mouth daily.       No facility-administered medications prior to visit.    Social History   Social History  . Marital Status: Married    Spouse Name: N/A  . Number of Children: Y  . Years of Education: N/A   Occupational History  . traveling paster    Social History Main Topics  . Smoking status: Former Smoker -- 2.00 packs/day for 28 years    Types: Cigarettes    Quit date: 12/06/1968  . Smokeless tobacco: Never Used  . Alcohol Use: No  . Drug Use: No  . Sexual Activity: Not Asked   Other Topics Concern  . None   Social History Narrative     Review of Systems  Constitutional: Negative for fever, chills and appetite change.  HENT: Negative for congestion, ear pain,  postnasal drip, sinus pressure and sore throat.   Eyes: Negative for pain and redness.  Respiratory: Negative for cough, shortness of breath and wheezing.   Cardiovascular: Negative for leg swelling.  Gastrointestinal: Positive for diarrhea (three loose stools yesterday). Negative for nausea, vomiting, abdominal pain, constipation and blood in stool.  Endocrine: Negative for polyuria.  Genitourinary: Negative for dysuria, urgency, frequency and flank pain.  Musculoskeletal: Negative for gait problem.  Skin: Negative for rash.  Neurological: Negative for weakness and headaches.  Psychiatric/Behavioral: Negative for confusion and decreased  concentration. The patient is not nervous/anxious.     Objective:  BP 147/70 mmHg  Pulse 83  Temp(Src) 97.9 F (36.6 C) (Oral)  Resp 18  Ht 5\' 8"  (1.727 m)  Wt 162 lb (73.483 kg)  BMI 24.64 kg/m2  SpO2 98%  Physical Exam  Constitutional: He is oriented to person, place, and time. He appears well-developed and well-nourished. No distress.  HENT:  Head: Normocephalic and atraumatic.  Right Ear: External ear normal.  Left Ear: External ear normal.  Nose: Nose normal.  Eyes: Conjunctivae and EOM are normal. Pupils are equal, round, and reactive to light. No scleral icterus.  Neck: Normal range of motion. Neck supple. No tracheal deviation present.  Cardiovascular: Normal rate, regular rhythm and normal heart sounds.   Pulmonary/Chest: Effort normal. No respiratory distress. He has no wheezes. He has no rales.  Abdominal: He exhibits no mass. There is no tenderness. There is no rebound and no guarding.  Musculoskeletal: He exhibits no edema.  Lymphadenopathy:    He has no cervical adenopathy.  Neurological: He is alert and oriented to person, place, and time. Coordination normal.  Skin: Skin is warm and dry. No rash noted.  Psychiatric: He has a normal mood and affect. His behavior is normal.      Assessment & Plan:   Khing was seen today for other.  Diagnoses and all orders for this visit:  Gastroenteritis, acute -     POCT CBC -     Comprehensive metabolic panel  I am having Mr. Fitton maintain his Nattokinase, b complex vitamins, magnesium gluconate, zinc gluconate, vitamin E, vitamin C, omeprazole, aspirin, amLODipine, ciprofloxacin, metroNIDAZOLE, and ondansetron.  No orders of the defined types were placed in this encounter.    Appropriate red flag conditions were discussed with the patient as well as actions that should be taken.  Patient expressed his understanding.  Follow-up: Return if symptoms worsen or fail to improve.  Roselee Culver, MD

## 2015-12-09 NOTE — Patient Instructions (Signed)

## 2015-12-10 NOTE — Telephone Encounter (Signed)
Received a message from Dr. Brigitte Pulse that she was also contacted about this pt and she will take care of it

## 2015-12-16 ENCOUNTER — Telehealth: Payer: Self-pay | Admitting: Internal Medicine

## 2015-12-16 NOTE — Telephone Encounter (Signed)
Pt states he has diarrhea over the holidays and was very weak, received IV fluids several times. Pt states he tested positive for rotavirus and was given some antibiotics. Diarrhea went away but came back. He took an Imodium and now has not had a BM in 3 days. Pt requesting to be seen due to changes in bowels. Pt scheduled to see Amy Esterwood PA 12/18/15@2 :30pm. Pt aware of appt.

## 2015-12-17 ENCOUNTER — Encounter: Payer: Self-pay | Admitting: *Deleted

## 2015-12-18 ENCOUNTER — Encounter: Payer: Self-pay | Admitting: Physician Assistant

## 2015-12-18 ENCOUNTER — Other Ambulatory Visit (INDEPENDENT_AMBULATORY_CARE_PROVIDER_SITE_OTHER): Payer: Medicare Other

## 2015-12-18 ENCOUNTER — Ambulatory Visit (INDEPENDENT_AMBULATORY_CARE_PROVIDER_SITE_OTHER): Payer: Medicare Other | Admitting: Physician Assistant

## 2015-12-18 VITALS — BP 110/50 | HR 93 | Ht 68.0 in | Wt 163.0 lb

## 2015-12-18 DIAGNOSIS — K529 Noninfective gastroenteritis and colitis, unspecified: Secondary | ICD-10-CM | POA: Diagnosis not present

## 2015-12-18 LAB — CBC WITH DIFFERENTIAL/PLATELET
BASOS PCT: 1.2 % (ref 0.0–3.0)
Basophils Absolute: 0.2 10*3/uL — ABNORMAL HIGH (ref 0.0–0.1)
EOS PCT: 8.1 % — AB (ref 0.0–5.0)
Eosinophils Absolute: 1.1 10*3/uL — ABNORMAL HIGH (ref 0.0–0.7)
HEMATOCRIT: 40.4 % (ref 39.0–52.0)
HEMOGLOBIN: 13.5 g/dL (ref 13.0–17.0)
LYMPHS PCT: 11 % — AB (ref 12.0–46.0)
Lymphs Abs: 1.5 10*3/uL (ref 0.7–4.0)
MCHC: 33.5 g/dL (ref 30.0–36.0)
MCV: 92.5 fl (ref 78.0–100.0)
MONOS PCT: 12.6 % — AB (ref 3.0–12.0)
Monocytes Absolute: 1.7 10*3/uL — ABNORMAL HIGH (ref 0.1–1.0)
Neutro Abs: 9.3 10*3/uL — ABNORMAL HIGH (ref 1.4–7.7)
Neutrophils Relative %: 67.1 % (ref 43.0–77.0)
Platelets: 491 10*3/uL — ABNORMAL HIGH (ref 150.0–400.0)
RBC: 4.37 Mil/uL (ref 4.22–5.81)
RDW: 16.7 % — AB (ref 11.5–15.5)
WBC: 13.9 10*3/uL — AB (ref 4.0–10.5)

## 2015-12-18 LAB — COMPREHENSIVE METABOLIC PANEL
ALBUMIN: 2.7 g/dL — AB (ref 3.5–5.2)
ALT: 15 U/L (ref 0–53)
AST: 18 U/L (ref 0–37)
Alkaline Phosphatase: 33 U/L — ABNORMAL LOW (ref 39–117)
BUN: 21 mg/dL (ref 6–23)
CALCIUM: 8 mg/dL — AB (ref 8.4–10.5)
CHLORIDE: 100 meq/L (ref 96–112)
CO2: 29 mEq/L (ref 19–32)
CREATININE: 1.09 mg/dL (ref 0.40–1.50)
GFR: 67.76 mL/min (ref >60.00)
Glucose, Bld: 131 mg/dL — ABNORMAL HIGH (ref 70–99)
POTASSIUM: 4.2 meq/L (ref 3.5–5.1)
Sodium: 131 mEq/L — ABNORMAL LOW (ref 135–145)
Total Bilirubin: 0.3 mg/dL (ref 0.2–1.2)
Total Protein: 4.8 g/dL — ABNORMAL LOW (ref 6.0–8.3)

## 2015-12-18 NOTE — Progress Notes (Addendum)
Patient ID: Patrick Rodriguez, male   DOB: [birth year], 82 y.o.   MRN: [id number]   Subjective:    Patient ID: Patrick Rodriguez, male    DOB: [birth year], 82 y.o.   MRN: [id number]  HPI Patrick Rodriguez is a pleasant 82 year old white male known to Dr. Hilarie Fredrickson who had undergone colonoscopy in December 2014 for anemia and complaints of small-volume rectal bleeding. He was found to have pandiverticulosis and 6 polyps were removed during 3-6 mm in size. 3 of these were tubular adenomas and 3 were hyperplastic.   He does have history of coronary artery disease, congestive heart failure, COPD, hypertension and carotid stenosis. He comes in today after an acute diarrheal illness. He says his symptoms started around December 15 with diarrhea and abdominal cramping. He was diagnosed with a gastroenteritis during ER visit on the 17th. He did have an elevated WBC of 11.9, hemoglobin was 13.7. A few days later he was seen by primary care. He says the diarrhea initially resolved and then about 3 days later came back was associated with weakness and dizziness. He was given a course of Cipro and Flagyl until stool cultures returned. GI pathogen panel returned positive for the Rotavirus and he was advised to stop taking the antibiotics. Continues to have diarrhea for a few more days and then last Saturday after speaking with a physician friend he started taking Imodium. He took a couple of doses and then did not have a bowel movement for 4 days. Says last night he finally had some hard formed stool and had another bowel movement this morning which was formed. He is overall feeling better and eating better still feels mildly uncomfortable in his abdomen. He did take a walk today and was able to walk a mile  without any difficulty but  felt quite fatigued afterwards.  Review of Systems Pertinent positive and negative review of systems were noted in the above HPI section.  All other review of systems was otherwise negative.  Outpatient Encounter  Prescriptions as of 12/18/2015  Medication Sig  . amLODipine (NORVASC) 5 MG tablet TAKE 1 TABLET BY MOUTH TWICE DAILY  . Ascorbic Acid (VITAMIN C) 1000 MG tablet Take 1,000 mg by mouth daily.    Marland Kitchen aspirin 81 MG tablet Take 81 mg by mouth daily.  Marland Kitchen b complex vitamins tablet Take 1 tablet by mouth daily.    . magnesium gluconate (MAGONATE) 500 MG tablet Take 500 mg by mouth daily.    . Nattokinase 100 MG CAPS Take by mouth daily.    Marland Kitchen omeprazole (PRILOSEC) 20 MG capsule Take 20 mg by mouth daily.  . vitamin E 400 UNIT capsule Take 400 Units by mouth daily.    Marland Kitchen zinc gluconate 50 MG tablet Take 50 mg by mouth daily.    . [DISCONTINUED] ciprofloxacin (CIPRO) 500 MG tablet Take 1 tablet (500 mg total) by mouth 2 (two) times daily.  . [DISCONTINUED] metroNIDAZOLE (FLAGYL) 500 MG tablet Take 1 tablet (500 mg total) by mouth 4 (four) times daily.  . [DISCONTINUED] ondansetron (ZOFRAN ODT) 4 MG disintegrating tablet Take 1 tablet (4 mg total) by mouth every 8 (eight) hours as needed for nausea or vomiting.   No facility-administered encounter medications on file as of 12/18/2015.   Allergies  Allergen Reactions  . Hctz [Hydrochlorothiazide]     Has had hyponatremia   Patient Active Problem List   Diagnosis Date Noted  . Esophageal dysphagia ? gerd related 11/11/2014  . (HFpEF) heart failure  with preserved ejection fraction ([place]) 12/14/2013  . Anemia 12/14/2013  . Acute post-hemorrhagic anemia 12/04/2013  . Cough 12/03/2013  . Carotid stenosis 11/06/2013  . Psoriasiform dermatitis 09/20/2012  . Edema 05/01/2012  . Syncope 12/29/2011  . CAD (coronary artery disease) 10/26/2011  . Asthma exacerbation 09/06/2011  . Acute bronchitis 09/06/2011  . HYPERLIPIDEMIA 03/14/2008  . Essential hypertension 03/14/2008  . MYOCARDIAL INFARCTION 03/14/2008  . ALLERGIC RHINITIS 03/14/2008  . COPD GOLD I 03/14/2008  . ANGINA, HX OF 03/14/2008   Social History   Social History  . Marital Status:  Married    Spouse Name: N/A  . Number of Children: Y  . Years of Education: N/A   Occupational History  . traveling paster    Social History Main Topics  . Smoking status: Former Smoker -- 2.00 packs/day for 28 years    Types: Cigarettes    Quit date: 12/06/1968  . Smokeless tobacco: Never Used  . Alcohol Use: No  . Drug Use: No  . Sexual Activity: Not on file   Other Topics Concern  . Not on file   Social History Narrative    Patrick Rodriguez family history includes Aneurysm in his mother; Cancer in his father and sister; Stomach cancer in his father.      Objective:    Filed Vitals:   12/18/15 1435  BP: 110/50  Pulse: 93    Physical Exam  white male in no acute distress, pleasant blood pressure 110/50 pulse 93 height 5 foot 8 weight 163. HEENT ;nontraumatic normocephalic EOMI PERRLA sclera anicteric cardiovascular regular rate and rhythm with S1-S2 no murmur or gallop, Pulmonary; clear bilaterally, Abdomen; soft nontender is no palpable mass or hepatosplenomegaly no guarding or rebound, Rectal; exam not done, Extremities; no clubbing cyanosis or edema skin warm and dry, Neuropsych; mood and affect appropriate     Assessment & Plan:   #1 82 yo male with recent Viral gasrtoenteritis-documented Rotavirus +- better after 2 weeks but still with abdominal  discomfort, and now hard stool #2 hx Adenomatous colon polyps 2014 #3 pandiverticulosis #4 CAD #5 COPD #6 HTN  Plan; Reassurance- believe he is over the acute illness, but may need a few weeks for bowel function to return to baseline  Continue daily probiotic Colace as needed  Push fluids over next week Check CBC, CMET  He will call for any  recurrence of diarrhea    Amy Genia Harold PA-C 12/18/2015   Cc: Darlyne Russian, MD   Addendum: Reviewed and agree with management. Jerene Bears, MD

## 2015-12-18 NOTE — Patient Instructions (Signed)
Please go to the basement level to have your labs drawn.  Contiinue a probiotic daily.  Take a stool softner as needed and push fluids.

## 2016-02-16 DIAGNOSIS — H2512 Age-related nuclear cataract, left eye: Secondary | ICD-10-CM | POA: Diagnosis not present

## 2016-02-16 DIAGNOSIS — H5212 Myopia, left eye: Secondary | ICD-10-CM | POA: Diagnosis not present

## 2016-02-16 DIAGNOSIS — H25812 Combined forms of age-related cataract, left eye: Secondary | ICD-10-CM | POA: Diagnosis not present

## 2016-02-17 DIAGNOSIS — H25011 Cortical age-related cataract, right eye: Secondary | ICD-10-CM | POA: Diagnosis not present

## 2016-02-17 DIAGNOSIS — H25041 Posterior subcapsular polar age-related cataract, right eye: Secondary | ICD-10-CM | POA: Diagnosis not present

## 2016-02-17 DIAGNOSIS — H2511 Age-related nuclear cataract, right eye: Secondary | ICD-10-CM | POA: Diagnosis not present

## 2016-02-23 DIAGNOSIS — H5212 Myopia, left eye: Secondary | ICD-10-CM | POA: Diagnosis not present

## 2016-02-23 DIAGNOSIS — Z961 Presence of intraocular lens: Secondary | ICD-10-CM | POA: Diagnosis not present

## 2016-02-23 DIAGNOSIS — H5213 Myopia, bilateral: Secondary | ICD-10-CM | POA: Diagnosis not present

## 2016-02-24 ENCOUNTER — Telehealth: Payer: Self-pay | Admitting: Family Medicine

## 2016-02-24 NOTE — Telephone Encounter (Signed)
Called patient to see if they have had the flu shot.   If they have where and when?  If not ask them to come in and get it.  If they decline please document it in health maintenance. 

## 2016-03-01 DIAGNOSIS — H5211 Myopia, right eye: Secondary | ICD-10-CM | POA: Diagnosis not present

## 2016-03-01 DIAGNOSIS — H2511 Age-related nuclear cataract, right eye: Secondary | ICD-10-CM | POA: Diagnosis not present

## 2016-03-01 DIAGNOSIS — H524 Presbyopia: Secondary | ICD-10-CM | POA: Diagnosis not present

## 2016-03-01 DIAGNOSIS — H25811 Combined forms of age-related cataract, right eye: Secondary | ICD-10-CM | POA: Diagnosis not present

## 2016-03-08 DIAGNOSIS — Z961 Presence of intraocular lens: Secondary | ICD-10-CM | POA: Diagnosis not present

## 2016-03-22 DIAGNOSIS — Z961 Presence of intraocular lens: Secondary | ICD-10-CM | POA: Diagnosis not present

## 2016-04-27 ENCOUNTER — Ambulatory Visit (INDEPENDENT_AMBULATORY_CARE_PROVIDER_SITE_OTHER): Payer: Medicare Other | Admitting: Family Medicine

## 2016-04-27 VITALS — BP 126/78 | HR 72 | Temp 98.0°F | Resp 17 | Ht 68.0 in | Wt 152.0 lb

## 2016-04-27 DIAGNOSIS — H6501 Acute serous otitis media, right ear: Secondary | ICD-10-CM

## 2016-04-27 DIAGNOSIS — J209 Acute bronchitis, unspecified: Secondary | ICD-10-CM | POA: Diagnosis not present

## 2016-04-27 MED ORDER — AZITHROMYCIN 250 MG PO TABS: ORAL_TABLET | ORAL | Status: DC

## 2016-04-27 MED ORDER — FLUTICASONE PROPIONATE 50 MCG/ACT NA SUSP: 2.0000 | Freq: Every day | NASAL | Status: DC

## 2016-04-27 MED ORDER — GUAIFENESIN ER 1200 MG PO TB12: 1.0000 | ORAL_TABLET | Freq: Two times a day (BID) | ORAL | Status: DC | PRN

## 2016-04-27 MED ORDER — BENZONATATE 100 MG PO CAPS: 100.0000 mg | ORAL_CAPSULE | Freq: Three times a day (TID) | ORAL | Status: DC | PRN

## 2016-04-27 NOTE — Progress Notes (Signed)
Subjective:  By signing my name below, I, Patrick Rodriguez, attest that this documentation has been prepared under the direction and in the presence of Delman Cheadle, MD. Electronically Signed: [name], [place]. 04/27/2016 , 9:26 AM .  Patient was seen in Room 10 .   Patient ID: [name], male    DOB: 04-14-1925, 82 y.o.   MRN: [id number] Chief Complaint  Patient presents with  . Cough  . URI   HPI Patrick Rodriguez is a 82 y.o. male who presents to [facility] complaining of URI symptoms that started 3-4 days ago. He's been having productive cough with yellow mucus and chest congestion. He also notes having really sore throat last night, however this has improved today. He denies much nasal congestion or any ear pain. He's only taken aspirin. He is former smoker, quit in 1970. He denies taking any allergy medication.   Past Medical History  Diagnosis Date  . Hyperlipidemia   . Hypertension   . Coronary artery disease     a.  s/p stenting of the LAD in 2000;  b. Lexiscan Myoview (12/2013): No ischemia, EF 70%; normal study  . Obesity   . ED (erectile dysfunction)   . Allergic rhinitis   . Normal nuclear stress test 2008  . Syncope Jan 2013    felt to be vasovagal; had normal echo, carotids ok, 1st degree AV block with mention (but no tracing) of 2nd degree type I AV block while in [place]  . GERD (gastroesophageal reflux disease)   . Normal echocardiogram Jan 2013    EF of 55% and no wall motion abnormalities  . Carotid artery stenosis     12/2011:  50-69% on the left and 1-49% on the right;  Carotid US (11/2013): Bilateral 40-59%; repeat 1 year  . Palpitations     Holter (11/2013): NSR, sinus brady, PVCs, rare blocked PAC, no sig arrhythmia  . Cataract   . Myocardial infarction ([place])   . Substance abuse   . Hyperplastic colon polyp 11/15/2013    x3  . Tubular adenoma 11/15/2013    x2   Prior to Admission medications   Medication Sig Start Date End Date Taking? Authorizing Provider    amLODipine (NORVASC) 5 MG tablet TAKE 1 TABLET BY MOUTH TWICE DAILY 08/20/15  Yes Peter M Martinique, MD  Ascorbic Acid (VITAMIN C) 1000 MG tablet Take 1,000 mg by mouth daily.     Yes Historical Provider, MD  aspirin 81 MG tablet Take 81 mg by mouth daily.   Yes Historical Provider, MD  b complex vitamins tablet Take 1 tablet by mouth daily.     Yes Historical Provider, MD  magnesium gluconate (MAGONATE) 500 MG tablet Take 500 mg by mouth daily.     Yes Historical Provider, MD  Nattokinase 100 MG CAPS Take by mouth daily.     Yes Historical Provider, MD  omeprazole (PRILOSEC) 20 MG capsule Take 20 mg by mouth daily.   Yes Historical Provider, MD  vitamin E 400 UNIT capsule Take 400 Units by mouth daily.     Yes Historical Provider, MD  zinc gluconate 50 MG tablet Take 50 mg by mouth daily.     Yes Historical Provider, MD   Allergies  Allergen Reactions  . Hctz [Hydrochlorothiazide]     Has had hyponatremia    Review of Systems  Constitutional: Negative for fever, chills and fatigue.  HENT: Positive for congestion, postnasal drip and sore throat. Negative for ear pain,  rhinorrhea and sinus pressure.   Respiratory: Positive for cough and chest tightness. Negative for shortness of breath and wheezing.        Objective:   Physical Exam  Constitutional: He is oriented to person, place, and time. He appears well-developed and well-nourished. No distress.  HENT:  Head: Normocephalic and atraumatic.  Right Ear: Tympanic membrane is erythematous. A middle ear effusion is present.  Left Ear: Tympanic membrane normal.  Nose: Nose normal.  Oropharynx postnasal drip  Eyes: EOM are normal. Pupils are equal, round, and reactive to light.  Neck: Neck supple. No thyromegaly present.  Cardiovascular: Normal rate, regular rhythm, S1 normal, S2 normal and normal heart sounds.   No murmur heard. Pulmonary/Chest: Effort normal and breath sounds normal. No respiratory distress.  Abdominal: Bowel sounds  are normal.  Musculoskeletal: Normal range of motion.  Lymphadenopathy:    He has no cervical adenopathy.  Neurological: He is alert and oriented to person, place, and time.  Skin: Skin is warm and dry.  Psychiatric: He has a normal mood and affect. His behavior is normal.  Nursing note and vitals reviewed.   BP 126/78 mmHg  Pulse 72  Temp(Src) 98 F (36.7 C) (Oral)  Resp 17  Ht 5\' 8"  (1.727 m)  Wt 152 lb (68.947 kg)  BMI 23.12 kg/m2  SpO2 100%    Assessment & Plan:   1. Right acute serous otitis media, recurrence not specified   2. Acute bronchitis, unspecified organism      Meds ordered this encounter  Medications  . azithromycin (ZITHROMAX) 250 MG tablet    Sig: Take 2 tabs PO x 1 dose, then 1 tab PO QD x 4 days    Dispense:  6 tablet    Refill:  0  . Guaifenesin (MUCINEX MAXIMUM STRENGTH) 1200 MG TB12    Sig: Take 1 tablet (1,200 mg total) by mouth every 12 (twelve) hours as needed.    Dispense:  14 tablet    Refill:  1  . fluticasone (FLONASE) 50 MCG/ACT nasal spray    Sig: Place 2 sprays into both nostrils at bedtime.    Dispense:  16 g    Refill:  2  . benzonatate (TESSALON) 100 MG capsule    Sig: Take 1-2 capsules (100-200 mg total) by mouth 3 (three) times daily as needed for cough.    Dispense:  40 capsule    Refill:  0    I personally performed the services described in this documentation, which was scribed in my presence. The recorded information has been reviewed and considered, and addended by me as needed.  Delman Cheadle, MD MPH

## 2016-04-27 NOTE — Patient Instructions (Addendum)
Meds ordered this encounter  Medications  . azithromycin (ZITHROMAX) 250 MG tablet    Sig: Take 2 tabs PO x 1 dose, then 1 tab PO QD x 4 days    Dispense:  6 tablet    Refill:  0  . Guaifenesin (MUCINEX MAXIMUM STRENGTH) 1200 MG TB12    Sig: Take 1 tablet (1,200 mg total) by mouth every 12 (twelve) hours as needed.    Dispense:  14 tablet    Refill:  1  . fluticasone (FLONASE) 50 MCG/ACT nasal spray    Sig: Place 2 sprays into both nostrils at bedtime.    Dispense:  16 g    Refill:  2  . benzonatate (TESSALON) 100 MG capsule    Sig: Take 1-2 capsules (100-200 mg total) by mouth 3 (three) times daily as needed for cough.    Dispense:  40 capsule    Refill:  0      IF you received an x-ray today, you will receive an invoice from [facility]. Please contact [facility] at [phone] with questions or concerns regarding your invoice.   IF you received labwork today, you will receive an invoice from [employer]. Please contact [facility] at [phone] with questions or concerns regarding your invoice.   Our billing staff will not be able to assist you with questions regarding bills from these companies.  You will be contacted with the lab results as soon as they are available. The fastest way to get your results is to activate your My Chart account. Instructions are located on the last page of this paperwork. If you have not heard from [place] regarding the results in 2 weeks, please contact this office.    Serous Otitis Media Serous otitis media is fluid in the middle ear space. This space contains the bones for hearing and air. Air in the middle ear space helps to transmit sound.  The air gets there through the eustachian tube. This tube goes from the back of the nose (nasopharynx) to the middle ear space. It keeps the pressure in the middle ear the same as the outside world. It also helps to drain fluid from the middle ear space. CAUSES   Serous otitis media occurs when the eustachian tube gets blocked. Blockage can come from:  Ear infections.  Colds and other upper respiratory infections.  Allergies.  Irritants such as cigarette smoke.  Sudden changes in air pressure (such as descending in an airplane).  Enlarged adenoids.  A mass in the nasopharynx. During colds and upper respiratory infections, the middle ear space can become temporarily filled with fluid. This can happen after an ear infection also. Once the infection clears, the fluid will generally drain out of the ear through the eustachian tube. If it does not, then serous otitis media occurs. SIGNS AND SYMPTOMS   Hearing loss.  A feeling of fullness in the ear, without pain.  Young children may not show any symptoms but may show slight behavioral changes, such as agitation, ear pulling, or crying. DIAGNOSIS  Serous otitis media is diagnosed by an ear exam. Tests may be done to check on the movement of the eardrum. Hearing exams may also be done. TREATMENT  The fluid most often goes away without treatment. If allergy is the cause, allergy treatment may be helpful. Fluid that persists for several months may require minor surgery. A small tube is placed in the eardrum to:  Drain the fluid.  Restore the air in the  middle ear space. In certain situations, antibiotic medicines are used to avoid surgery. Surgery may be done to remove enlarged adenoids (if this is the cause). HOME CARE INSTRUCTIONS   Keep children away from tobacco smoke.  Keep all follow-up visits as directed by your health care provider. SEEK MEDICAL CARE IF:   Your hearing is not better in 3 months.  Your hearing is worse.  You have ear pain.  You have drainage from the ear.  You have dizziness.  You have serous otitis media only in one ear or have any bleeding from your nose (epistaxis).  You notice a lump on your neck. MAKE SURE YOU:  Understand these instructions.    Will watch your condition.   Will get help right away if you are not doing well or get worse.    This information is not intended to replace advice given to you by your health care provider. Make sure you discuss any questions you have with your health care provider.   Document Released: 02/12/2004 Document Revised: 12/13/2014 Document Reviewed: 06/19/2013 Elsevier Interactive Patient Education 2016 Elsevier Inc. Acute Bronchitis Bronchitis is inflammation of the airways that extend from the windpipe into the lungs (bronchi). The inflammation often causes mucus to develop. This leads to a cough, which is the most common symptom of bronchitis.  In acute bronchitis, the condition usually develops suddenly and goes away over time, usually in a couple weeks. Smoking, allergies, and asthma can make bronchitis worse. Repeated episodes of bronchitis may cause further lung problems.  CAUSES Acute bronchitis is most often caused by the same virus that causes a cold. The virus can spread from person to person (contagious) through coughing, sneezing, and touching contaminated objects. SIGNS AND SYMPTOMS   Cough.   Fever.   Coughing up mucus.   Body aches.   Chest congestion.   Chills.   Shortness of breath.   Sore throat.  DIAGNOSIS  Acute bronchitis is usually diagnosed through a physical exam. Your health care provider will also ask you questions about your medical history. Tests, such as chest X-rays, are sometimes done to rule out other conditions.  TREATMENT  Acute bronchitis usually goes away in a couple weeks. Oftentimes, no medical treatment is necessary. Medicines are sometimes given for relief of fever or cough. Antibiotic medicines are usually not needed but may be prescribed in certain situations. In some cases, an inhaler may be recommended to help reduce shortness of breath and control the cough. A cool mist vaporizer may also be used to help thin bronchial  secretions and make it easier to clear the chest.  HOME CARE INSTRUCTIONS  Get plenty of rest.   Drink enough fluids to keep your urine clear or pale yellow (unless you have a medical condition that requires fluid restriction). Increasing fluids may help thin your respiratory secretions (sputum) and reduce chest congestion, and it will prevent dehydration.   Take medicines only as directed by your health care provider.  If you were prescribed an antibiotic medicine, finish it all even if you start to feel better.  Avoid smoking and secondhand smoke. Exposure to cigarette smoke or irritating chemicals will make bronchitis worse. If you are a smoker, consider using nicotine gum or skin patches to help control withdrawal symptoms. Quitting smoking will help your lungs heal faster.   Reduce the chances of another bout of acute bronchitis by washing your hands frequently, avoiding people with cold symptoms, and trying not to touch your hands to your  mouth, nose, or eyes.   Keep all follow-up visits as directed by your health care provider.  SEEK MEDICAL CARE IF: Your symptoms do not improve after 1 week of treatment.  SEEK IMMEDIATE MEDICAL CARE IF:  You develop an increased fever or chills.   You have chest pain.   You have severe shortness of breath.  You have bloody sputum.   You develop dehydration.  You faint or repeatedly feel like you are going to pass out.  You develop repeated vomiting.  You develop a severe headache. MAKE SURE YOU:   Understand these instructions.  Will watch your condition.  Will get help right away if you are not doing well or get worse.   This information is not intended to replace advice given to you by your health care provider. Make sure you discuss any questions you have with your health care provider.   Document Released: 12/30/2004 Document Revised: 12/13/2014 Document Reviewed: 05/15/2013 Elsevier Interactive Patient Education  [employer].

## 2016-07-05 DIAGNOSIS — I2510 Atherosclerotic heart disease of native coronary artery without angina pectoris: Secondary | ICD-10-CM | POA: Diagnosis not present

## 2016-07-05 DIAGNOSIS — Z1389 Encounter for screening for other disorder: Secondary | ICD-10-CM | POA: Diagnosis not present

## 2016-07-05 DIAGNOSIS — I10 Essential (primary) hypertension: Secondary | ICD-10-CM | POA: Diagnosis not present

## 2016-07-05 DIAGNOSIS — Z23 Encounter for immunization: Secondary | ICD-10-CM | POA: Diagnosis not present

## 2016-07-05 DIAGNOSIS — Z0000 Encounter for general adult medical examination without abnormal findings: Secondary | ICD-10-CM | POA: Diagnosis not present

## 2016-07-26 DIAGNOSIS — R3915 Urgency of urination: Secondary | ICD-10-CM | POA: Diagnosis not present

## 2016-07-26 DIAGNOSIS — N401 Enlarged prostate with lower urinary tract symptoms: Secondary | ICD-10-CM | POA: Diagnosis not present

## 2016-08-24 ENCOUNTER — Other Ambulatory Visit: Payer: Self-pay | Admitting: Cardiology

## 2016-08-24 ENCOUNTER — Other Ambulatory Visit: Payer: Self-pay | Admitting: *Deleted

## 2016-08-24 DIAGNOSIS — R109 Unspecified abdominal pain: Secondary | ICD-10-CM | POA: Diagnosis not present

## 2016-08-24 DIAGNOSIS — Z23 Encounter for immunization: Secondary | ICD-10-CM | POA: Diagnosis not present

## 2016-08-24 DIAGNOSIS — R079 Chest pain, unspecified: Secondary | ICD-10-CM | POA: Diagnosis not present

## 2016-08-24 DIAGNOSIS — I10 Essential (primary) hypertension: Secondary | ICD-10-CM | POA: Diagnosis not present

## 2016-08-24 MED ORDER — AMLODIPINE BESYLATE 5 MG PO TABS: 5.0000 mg | ORAL_TABLET | Freq: Two times a day (BID) | ORAL | 3 refills | Status: DC

## 2016-08-24 NOTE — Telephone Encounter (Signed)
New Message   *STAT* If patient is at the pharmacy, call can be transferred to refill team.   1. Which medications need to be refilled? (please list name of each medication and dose if known) amlodipine 5 gm 1 tablet twice daily  2. Which pharmacy/location (including street and city if local pharmacy) is medication to be sent to? [facility], [address] [place], [place]  3. Do they need a 30 day or 90 day supply? 90 days  Pt voiced he only has 1 pill left.

## 2016-08-24 NOTE — Telephone Encounter (Signed)
Rx request sent to pharmacy.  

## 2016-08-27 ENCOUNTER — Ambulatory Visit
Admission: RE | Admit: 2016-08-27 | Discharge: 2016-08-27 | Disposition: A | Discharge status: Home / Self Care | Payer: Medicare Other | Source: Ambulatory Visit | Attending: Family Medicine | Admitting: Family Medicine

## 2016-08-27 ENCOUNTER — Other Ambulatory Visit: Payer: Self-pay | Admitting: Family Medicine

## 2016-08-27 DIAGNOSIS — K5790 Diverticulosis of intestine, part unspecified, without perforation or abscess without bleeding: Secondary | ICD-10-CM | POA: Diagnosis not present

## 2016-08-27 DIAGNOSIS — R1032 Left lower quadrant pain: Secondary | ICD-10-CM

## 2016-08-27 MED ORDER — IOPAMIDOL (ISOVUE-300) INJECTION 61%
100.0000 mL | Freq: Once | INTRAVENOUS | Status: AC | PRN
  Administered 2016-08-27: 100 mL via INTRAVENOUS

## 2016-10-13 DIAGNOSIS — M9903 Segmental and somatic dysfunction of lumbar region: Secondary | ICD-10-CM | POA: Diagnosis not present

## 2016-10-13 DIAGNOSIS — M4726 Other spondylosis with radiculopathy, lumbar region: Secondary | ICD-10-CM | POA: Diagnosis not present

## 2016-10-18 DIAGNOSIS — M9903 Segmental and somatic dysfunction of lumbar region: Secondary | ICD-10-CM | POA: Diagnosis not present

## 2016-10-18 DIAGNOSIS — M4726 Other spondylosis with radiculopathy, lumbar region: Secondary | ICD-10-CM | POA: Diagnosis not present

## 2016-10-20 DIAGNOSIS — M4726 Other spondylosis with radiculopathy, lumbar region: Secondary | ICD-10-CM | POA: Diagnosis not present

## 2016-10-20 DIAGNOSIS — M9903 Segmental and somatic dysfunction of lumbar region: Secondary | ICD-10-CM | POA: Diagnosis not present

## 2016-10-25 DIAGNOSIS — M9903 Segmental and somatic dysfunction of lumbar region: Secondary | ICD-10-CM | POA: Diagnosis not present

## 2016-10-25 DIAGNOSIS — M4726 Other spondylosis with radiculopathy, lumbar region: Secondary | ICD-10-CM | POA: Diagnosis not present

## 2016-11-01 DIAGNOSIS — M4726 Other spondylosis with radiculopathy, lumbar region: Secondary | ICD-10-CM | POA: Diagnosis not present

## 2016-11-01 DIAGNOSIS — M9903 Segmental and somatic dysfunction of lumbar region: Secondary | ICD-10-CM | POA: Diagnosis not present

## 2016-11-04 DIAGNOSIS — M4726 Other spondylosis with radiculopathy, lumbar region: Secondary | ICD-10-CM | POA: Diagnosis not present

## 2016-11-04 DIAGNOSIS — M9903 Segmental and somatic dysfunction of lumbar region: Secondary | ICD-10-CM | POA: Diagnosis not present

## 2016-11-08 DIAGNOSIS — M9903 Segmental and somatic dysfunction of lumbar region: Secondary | ICD-10-CM | POA: Diagnosis not present

## 2016-11-08 DIAGNOSIS — M4726 Other spondylosis with radiculopathy, lumbar region: Secondary | ICD-10-CM | POA: Diagnosis not present

## 2016-11-10 NOTE — Progress Notes (Signed)
Patrick Rodriguez Date of Birth: 1925/03/28 Medical Record [id number]  History of Present Illness: Patrick Rodriguez is seen for follow up CAD and HTN. He has a history of HLD, HTN, CAD with remote stenting of the LAD in 2000, obesity, ED, syncope in 2013 felt to be due to dehydration. HCTZ was stopped at that time.  Stress Myoview 1/15 was normal with EF 70%. Monitor showed PVCs and PACs but no serious arrhythmia. He had a new anemia with Hgb 8.8. He underwent GI evaluation which demonstrated several polyps and diverticulosis. He is on iron therapy. Hgb is now normal.   On follow up today he is feeling  well. Not as active with exercise or traveling this year. Reports BP at home [id number] systolic. Diastolic readings always low. Denies any chest pain. Had some LUQ pain in September. CT negative for diverticulitis.  Current Outpatient Prescriptions on File Prior to Visit  Medication Sig Dispense Refill  . amLODipine (NORVASC) 5 MG tablet Take 1 tablet (5 mg total) by mouth 2 (two) times daily. 180 tablet 3  . Ascorbic Acid (VITAMIN C) 1000 MG tablet Take 1,000 mg by mouth daily.      Marland Kitchen aspirin 81 MG tablet Take 81 mg by mouth daily.    Marland Kitchen b complex vitamins tablet Take 1 tablet by mouth daily.      . benzonatate (TESSALON) 100 MG capsule Take 1-2 capsules (100-200 mg total) by mouth 3 (three) times daily as needed for cough. 40 capsule 0  . magnesium gluconate (MAGONATE) 500 MG tablet Take 500 mg by mouth daily.      . Nattokinase 100 MG CAPS Take by mouth daily.      . vitamin E 400 UNIT capsule Take 400 Units by mouth daily.      Marland Kitchen zinc gluconate 50 MG tablet Take 50 mg by mouth daily.      . [DISCONTINUED] losartan-hydrochlorothiazide (HYZAAR) 100-25 MG per tablet Take 1 tablet by mouth daily. 30 tablet 5   No current facility-administered medications on file prior to visit.     Allergies  Allergen Reactions  . Hctz [Hydrochlorothiazide]     Has had hyponatremia    Past Medical History:   Diagnosis Date  . Allergic rhinitis   . Carotid artery stenosis    12/2011:  50-69% on the left and 1-49% on the right;  Carotid US (11/2013): Bilateral 40-59%; repeat 1 year  . Cataract   . Coronary artery disease    a.  s/p stenting of the LAD in 2000;  b. Lexiscan Myoview (12/2013): No ischemia, EF 70%; normal study  . ED (erectile dysfunction)   . GERD (gastroesophageal reflux disease)   . Hyperlipidemia   . Hyperplastic colon polyp 11/15/2013   x3  . Hypertension   . Myocardial infarction   . Normal echocardiogram Jan 2013   EF of 55% and no wall motion abnormalities  . Normal nuclear stress test 2008  . Obesity   . Palpitations    Holter (11/2013): NSR, sinus brady, PVCs, rare blocked PAC, no sig arrhythmia  . Substance abuse   . Syncope Jan 2013   felt to be vasovagal; had normal echo, carotids ok, 1st degree AV block with mention (but no tracing) of 2nd degree type I AV block while in [place]  . Tubular adenoma 11/15/2013   x2    Past Surgical History:  Procedure Laterality Date  . APPENDECTOMY    . CARDIAC CATHETERIZATION  01/20/1999   EF  60%  . CARDIOVASCULAR STRESS TEST  04/19/2007   EF 74%  . CORONARY STENT PLACEMENT  2000   LAD  . DOPPLER ECHOCARDIOGRAPHY  02/06/1998   EF 60%  . TONSILLECTOMY      History  Smoking Status  . Former Smoker  . Packs/day: 2.00  . Years: 28.00  . Types: Cigarettes  . Quit date: 12/06/1968  Smokeless Tobacco  . Never Used    History  Alcohol Use No    Family History  Problem Relation Age of Onset  . Aneurysm Mother   . Stomach cancer Father   . Cancer Father   . Cancer Sister     Review of Systems: As noted in history of present illness. All other systems were reviewed and are negative.  Physical Exam: BP (!) 182/72   Pulse 71   Ht 5\' 8"  (1.727 m)   Wt 161 lb 12.8 oz (73.4 kg)   BMI 24.60 kg/m  Patient is very pleasant and in no acute distress.  Skin is warm and dry. Color is normal.  HEENT is unremarkable.  Normocephalic/atraumatic. PERRL. Sclera are nonicteric. Neck is supple. No masses. No JVD. Lungs are clear. Cardiac exam shows a regular rate and rhythm. Abdomen is soft. Extremities are with trace edema. Gait and ROM are intact. No gross neurologic deficits noted.    LABORATORY DATA:   Ecg today shows NSR with first degree AV block. Otherwise normal. I have personally reviewed and interpreted this study.  Lab Results  Component Value Date   WBC 13.9 (H) 12/18/2015   HGB 13.5 12/18/2015   HCT 40.4 12/18/2015   PLT 491.0 (H) 12/18/2015   GLUCOSE 131 (H) 12/18/2015   CHOL 157 07/05/2013   TRIG 46.0 07/05/2013   HDL 38.90 (L) 07/05/2013   LDLCALC 109 (H) 07/05/2013   ALT 15 12/18/2015   AST 18 12/18/2015   NA 131 (L) 12/18/2015   K 4.2 12/18/2015   CL 100 12/18/2015   CREATININE 1.09 12/18/2015   BUN 21 12/18/2015   CO2 29 12/18/2015   TSH 1.813 06/19/2015   PSA 1.28 06/19/2015   INR 1.11 08/11/2011   Labs reviewed from 07/05/16: cholesterol 190, triglycerides 72, LDL 119, HDL 56. From 08/24/16- CMET normal.   CT ABDOMEN AND PELVIS WITH CONTRAST  TECHNIQUE: Multidetector CT imaging of the abdomen and pelvis was performed using the standard protocol following bolus administration of intravenous contrast.  CONTRAST:  133mL ISOVUE-300 IOPAMIDOL (ISOVUE-300) INJECTION 61%  COMPARISON:  07/14/2009  FINDINGS: Lower Chest: No acute findings.  Hepatobiliary: No mass identified. A few tiny sub-cm hepatic cysts remain stable. 2 cm calcified gallstone is seen. No evidence cholecystitis or biliary ductal dilatation.  Pancreas:  No mass or inflammatory changes.  Spleen:  Unremarkable.  Adrenals/Urinary Tract: No masses identified. Tiny sub-cm right lower pole renal cyst noted. No evidence of hydronephrosis. Unopacified urinary bladder is unremarkable in appearance.  Stomach/Bowel: Large proximal duodenal diverticulum again seen measuring approximately 5 cm. Diffuse  colonic diverticulosis is again demonstrated, without evidence of diverticulitis. No evidence of obstruction, inflammatory process or abnormal fluid collections.  Vascular/Lymphatic: No pathologically enlarged lymph nodes. No abdominal aortic aneurysm. Aortic atherosclerosis.  Reproductive:  Stable mildly enlarged prostate.  Other: Stable small right inguinal hernia containing only fat. No evidence of herniated bowel loops.  Musculoskeletal:  No suspicious bone lesions identified.  IMPRESSION: No acute findings within the abdomen or pelvis.  Cholelithiasis.  No radiographic evidence of cholecystitis.  Colonic diverticulosis and large duodenal  diverticulum. No radiographic evidence of diverticulitis.  Stable mildly enlarged prostate and small fat containing right inguinal hernia.  Aortic atherosclerosis.   Electronically Signed   By: Earle Gell M.D.   On: 08/27/2016 17:02    Assessment / Plan: 1. Coronary disease with prior stenting of LAD in 2000. Myoview Jan. 2015 was normal. He remains asymptomatic. We'll continue medical therapy.  2. Hypertension blood pressure is elevated today but under reasonable control at home. Continue amlodipine 5 mg bid. Encourage getting back into his exercise routine to maintain his conditioning.   3. Hyperlipidemia.  4. History of vasovagal syncope.  5.  Anemia. Hgb normal on iron therapy  I will follow up in one year

## 2016-11-11 ENCOUNTER — Encounter: Payer: Self-pay | Admitting: Cardiology

## 2016-11-11 ENCOUNTER — Ambulatory Visit (INDEPENDENT_AMBULATORY_CARE_PROVIDER_SITE_OTHER): Payer: Medicare Other | Admitting: Cardiology

## 2016-11-11 VITALS — BP 182/72 | HR 71 | Ht 68.0 in | Wt 161.8 lb

## 2016-11-11 DIAGNOSIS — E7800 Pure hypercholesterolemia, unspecified: Secondary | ICD-10-CM | POA: Diagnosis not present

## 2016-11-11 DIAGNOSIS — I10 Essential (primary) hypertension: Secondary | ICD-10-CM | POA: Diagnosis not present

## 2016-11-11 DIAGNOSIS — M4726 Other spondylosis with radiculopathy, lumbar region: Secondary | ICD-10-CM | POA: Diagnosis not present

## 2016-11-11 DIAGNOSIS — I2510 Atherosclerotic heart disease of native coronary artery without angina pectoris: Secondary | ICD-10-CM

## 2016-11-11 DIAGNOSIS — M9903 Segmental and somatic dysfunction of lumbar region: Secondary | ICD-10-CM | POA: Diagnosis not present

## 2016-11-11 NOTE — Patient Instructions (Signed)
Continue your current therapy  I will see you in one year   

## 2016-11-15 DIAGNOSIS — M4726 Other spondylosis with radiculopathy, lumbar region: Secondary | ICD-10-CM | POA: Diagnosis not present

## 2016-11-15 DIAGNOSIS — M9903 Segmental and somatic dysfunction of lumbar region: Secondary | ICD-10-CM | POA: Diagnosis not present

## 2016-11-17 DIAGNOSIS — Z961 Presence of intraocular lens: Secondary | ICD-10-CM | POA: Diagnosis not present

## 2016-11-18 DIAGNOSIS — M9903 Segmental and somatic dysfunction of lumbar region: Secondary | ICD-10-CM | POA: Diagnosis not present

## 2016-11-18 DIAGNOSIS — M4726 Other spondylosis with radiculopathy, lumbar region: Secondary | ICD-10-CM | POA: Diagnosis not present

## 2016-11-22 DIAGNOSIS — M9903 Segmental and somatic dysfunction of lumbar region: Secondary | ICD-10-CM | POA: Diagnosis not present

## 2016-11-22 DIAGNOSIS — M4726 Other spondylosis with radiculopathy, lumbar region: Secondary | ICD-10-CM | POA: Diagnosis not present

## 2016-12-08 DIAGNOSIS — M4726 Other spondylosis with radiculopathy, lumbar region: Secondary | ICD-10-CM | POA: Diagnosis not present

## 2016-12-08 DIAGNOSIS — M9903 Segmental and somatic dysfunction of lumbar region: Secondary | ICD-10-CM | POA: Diagnosis not present

## 2016-12-29 DIAGNOSIS — M9903 Segmental and somatic dysfunction of lumbar region: Secondary | ICD-10-CM | POA: Diagnosis not present

## 2016-12-29 DIAGNOSIS — M4726 Other spondylosis with radiculopathy, lumbar region: Secondary | ICD-10-CM | POA: Diagnosis not present

## 2017-01-24 DIAGNOSIS — Z85828 Personal history of other malignant neoplasm of skin: Secondary | ICD-10-CM | POA: Diagnosis not present

## 2017-01-24 DIAGNOSIS — L570 Actinic keratosis: Secondary | ICD-10-CM | POA: Diagnosis not present

## 2017-01-24 DIAGNOSIS — D692 Other nonthrombocytopenic purpura: Secondary | ICD-10-CM | POA: Diagnosis not present

## 2017-01-24 DIAGNOSIS — L821 Other seborrheic keratosis: Secondary | ICD-10-CM | POA: Diagnosis not present

## 2017-01-24 DIAGNOSIS — D1801 Hemangioma of skin and subcutaneous tissue: Secondary | ICD-10-CM | POA: Diagnosis not present

## 2017-07-18 DIAGNOSIS — N400 Enlarged prostate without lower urinary tract symptoms: Secondary | ICD-10-CM | POA: Diagnosis not present

## 2017-07-18 DIAGNOSIS — Z1389 Encounter for screening for other disorder: Secondary | ICD-10-CM | POA: Diagnosis not present

## 2017-07-18 DIAGNOSIS — I2510 Atherosclerotic heart disease of native coronary artery without angina pectoris: Secondary | ICD-10-CM | POA: Diagnosis not present

## 2017-07-18 DIAGNOSIS — R739 Hyperglycemia, unspecified: Secondary | ICD-10-CM | POA: Diagnosis not present

## 2017-07-18 DIAGNOSIS — Z0000 Encounter for general adult medical examination without abnormal findings: Secondary | ICD-10-CM | POA: Diagnosis not present

## 2017-07-18 DIAGNOSIS — I10 Essential (primary) hypertension: Secondary | ICD-10-CM | POA: Diagnosis not present

## 2017-07-18 DIAGNOSIS — K219 Gastro-esophageal reflux disease without esophagitis: Secondary | ICD-10-CM | POA: Diagnosis not present

## 2017-08-16 ENCOUNTER — Other Ambulatory Visit: Payer: Self-pay | Admitting: Cardiology

## 2017-08-16 NOTE — Telephone Encounter (Signed)
REFILL 

## 2017-08-19 DIAGNOSIS — E860 Dehydration: Secondary | ICD-10-CM | POA: Diagnosis not present

## 2017-08-19 DIAGNOSIS — D649 Anemia, unspecified: Secondary | ICD-10-CM | POA: Diagnosis not present

## 2017-09-09 DIAGNOSIS — D500 Iron deficiency anemia secondary to blood loss (chronic): Secondary | ICD-10-CM | POA: Diagnosis not present

## 2017-11-14 ENCOUNTER — Ambulatory Visit: Payer: Medicare Other | Admitting: Cardiology

## 2017-11-16 ENCOUNTER — Other Ambulatory Visit: Payer: Self-pay | Admitting: Cardiology

## 2017-11-16 NOTE — Telephone Encounter (Signed)
Rx(s) sent to pharmacy electronically.  

## 2017-12-10 DIAGNOSIS — J209 Acute bronchitis, unspecified: Secondary | ICD-10-CM | POA: Diagnosis not present

## 2017-12-13 ENCOUNTER — Ambulatory Visit (INDEPENDENT_AMBULATORY_CARE_PROVIDER_SITE_OTHER)
Admission: RE | Admit: 2017-12-13 | Discharge: 2017-12-13 | Disposition: A | Discharge status: Home / Self Care | Payer: Medicare Other | Source: Ambulatory Visit | Attending: Internal Medicine | Admitting: Internal Medicine

## 2017-12-13 ENCOUNTER — Ambulatory Visit: Payer: Medicare Other | Admitting: Internal Medicine

## 2017-12-13 ENCOUNTER — Encounter: Payer: Self-pay | Admitting: Internal Medicine

## 2017-12-13 VITALS — BP 130/60 | HR 77 | Temp 97.2°F | Ht 68.0 in | Wt 164.8 lb

## 2017-12-13 DIAGNOSIS — R05 Cough: Secondary | ICD-10-CM | POA: Diagnosis not present

## 2017-12-13 DIAGNOSIS — J441 Chronic obstructive pulmonary disease with (acute) exacerbation: Secondary | ICD-10-CM | POA: Diagnosis not present

## 2017-12-13 DIAGNOSIS — J449 Chronic obstructive pulmonary disease, unspecified: Secondary | ICD-10-CM

## 2017-12-13 MED ORDER — MOMETASONE FURO-FORMOTEROL FUM 100-5 MCG/ACT IN AERO: 2.0000 | INHALATION_SPRAY | Freq: Two times a day (BID) | RESPIRATORY_TRACT | 0 refills | Status: DC

## 2017-12-13 MED ORDER — PREDNISONE 10 MG PO TABS
ORAL_TABLET | ORAL | 0 refills | Status: DC
Start: 2017-12-13 — End: 2018-08-03

## 2017-12-13 NOTE — Progress Notes (Signed)
Spoke with pt and notified of results per Dr. Wert. Pt verbalized understanding and denied any questions. 

## 2017-12-13 NOTE — Patient Instructions (Addendum)
Finish your zpak and take Prednisone 10 mg take  4 each am x 2 days,   2 each am x 2 days,  1 each am x 2 days and stop   For wheeze/ congestion/ short of breath >  dulera 100 up to 12 hours as needed   Work on inhaler technique:  relax and gently blow all the way out then take a nice smooth deep breath back in, triggering the inhaler at same time you start breathing in.  Hold for up to 5 seconds if you can. Blow out thru nose. Rinse and gargle with water when done    Only use your albuterol [facility]) is a rescue medication to be used if you can't catch your breath by resting or doing a relaxed purse lip breathing pattern.  - The less you use it, the better it will work when you need it. - Ok to use up to 2 puffs  every 4 hours if you must but call for immediate appointment if use goes up over your usual need - Don't leave home without it !!  (think of it like the spare tire for your car)        When you any resp flare > Prilosec 40 mg Take 30- 60 min before your first and last meals of the day until better then resume previous dose  GERD (REFLUX)  is an extremely common cause of respiratory symptoms just like yours , many times with no obvious heartburn at all.    It can be treated with medication, but also with lifestyle changes including elevation of the head of your bed (ideally with 6 inch  bed blocks),  Smoking cessation, avoidance of late meals, excessive alcohol, and avoid fatty foods, chocolate, peppermint, colas, red wine, and acidic juices such as orange juice.  NO MINT OR MENTHOL PRODUCTS SO NO COUGH DROPS   USE SUGARLESS CANDY INSTEAD (Jolley ranchers or Stover's or Life Savers) or even ice chips will also do - the key is to swallow to prevent all throat clearing. NO OIL BASED VITAMINS - use powdered substitutes.    Please remember to go to the  x-ray department downstairs in the basement  for your tests - we will call you with the results when they are available.  If not 100%  in 2 weeks

## 2017-12-13 NOTE — Assessment & Plan Note (Addendum)
-   quit smoking 1970 [place] 2009:  FEV1 1.72 (64%), ratio 52  - poorly tolerant to ICS due to dysphonia, even with rinsing and spacer    10/24/2014 p extensive coaching HFA effectiveness =    75% > prn dulera 100 rec - PFT's  FEV1 2.44 (106%) ratio 68 no change p saba off dulera x weeks/ dlco 51% corrects to 61 with alv vol   - 12/13/2017  After extensive coaching inhaler device  effectiveness =    75% > resume dulera 100 up to 2 every 12 h as needed  Acute flare likely related to viral uri plus gerd  Of the three most common causes of  Sub-acute or recurrent or chronic cough, only one (GERD)  can actually contribute to/ trigger  the other two (asthma and post nasal drip syndrome)  and perpetuate the cylce of cough.  While not intuitively obvious, many patients with chronic low grade reflux do not cough until there is a primary insult that disturbs the protective epithelial barrier and exposes sensitive nerve endings.   This is typically viral but can be direct physical injury such as with an endotracheal tube.   The point is that once this occurs, it is difficult to eliminate the cycle  using anything but a maximally effective acid suppression regimen at least in the short run, accompanied by an appropriate diet to address non acid GERD  Though may only need this aggressive rx for the duration of the uri/ reviewed with pt  If back to baseline no need for f/u/ otherwise see in 2 weeks  see avs for instructions unique to this ov

## 2017-12-13 NOTE — Progress Notes (Signed)
Subjective:     Patient ID: Patrick Rodriguez, male   DOB: 03/19/1925    MRN: [id number]    Brief patient profile:  89 yowm quit smoking 1970 with documented  copd on prn symbiocrt 160 2bid due to severe hoarseness only  GOLD I on f/u pfts 01/14/2015    History of Present Illness  12/03/13 ov/Patrick Rodriguez  Abruptly more sob with exertion since acute GIB x one month prior to OV assoc with fatigue  And more cough x one - two weeks assoc with noct wheeze so started symbicort 160 2 bid but more hoarse since. Sob with more than slow adls, some better p restarted symbicort, some better with saba. rec Add pepcid 20 mg ac at bedtime as long as having night time symptoms (available over the counter) Stop symbicort  Start dulera 100 Take 2 puffs first thing in am and then another 2 puffs about 12 hours later.  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 Follow-up    cough clear SOB  Not limited by breathing from desired activities  Including treadmill 3 x weekly  Sleep ok unless obvious gerd once or twice weekly > cough  More than sob  rec Bed blocks would be a good idea Prilosec Take 30-60 min before first meal of the day  Pepcid 20 mg one at bedtime Dulera 100 1-2 pffs every 12 hours if needed for breathing/ coughing  GERD diet     01/14/2015 f/u ov/Patrick Rodriguez re: GOLD I copd  Chief Complaint  Patient presents with  . Follow-up    PFT done today. Breathing is doing well. No new co's today.   Not limited by breathing from desired activities   Rarely using dulera / never saba  rec You do not have significant copd now and so never will There may be some mild asthma present for which dulera taken up to 2 puffs evey 12 hours should be adequate whenever you have cough/ short of breath Most of your symptoms are likely reflux related for which diet/ wt loss are the most  important treatments     12/13/2017 acute extended ov/Patrick Rodriguez re:  Chief Complaint  Patient presents with  . Acute Visit    Pt c/o cough and SOB for the past 6 days. He went to [facility] and was told he had bronchitis and was given zpack, proair and promethazine cough syrup. He states he is not really improving much. He is coughing with clear sputum, wheezing and also has some chest tightness.   not needing dulera very several years Diet/ wt loss / no elevation / taking prilosec 20 mg at breakfast  Acutely ill x 6 days started with nasal congestion and pressure over your frontal sinuses which improved and clear mucus  Sore throat better.  Wheeze worse at hs / better with plm  Or proair for a few hours    No obvious day to day or daytime variability or assoc  mucus plugs or hemoptysis or cp or chest tightness, subjective wheeze or overt sinus or hb symptoms. No unusual exposure hx or h/o childhood pna/ asthma or knowledge of premature birth.   . Also denies any obvious fluctuation of symptoms with weather or environmental changes or other aggravating or alleviating factors except as outlined above   Current Allergies, Complete  Past Medical History, Past Surgical History, Family History, and Social History were reviewed in [employer] record.  ROS  The following are not active complaints unless bolded Hoarseness, sore throat, dysphagia, dental problems, itching, sneezing,  nasal congestion or discharge of excess mucus or purulent secretions, ear ache,   fever, chills, sweats, unintended wt loss or wt gain, classically pleuritic or exertional cp,  orthopnea pnd or leg swelling, presyncope, palpitations, abdominal pain, anorexia, nausea, vomiting, diarrhea  or change in bowel habits or change in bladder habits, change in stools or change in urine, dysuria, hematuria,  rash, arthralgias, visual complaints, headache, numbness, weakness or ataxia or problems with walking or coordination,   change in mood/affect or memory.        Current Meds  Medication Sig  . amLODipine (NORVASC) 5 MG tablet TAKE 1 TABLET BY MOUTH TWICE DAILY  . Ascorbic Acid (VITAMIN C) 1000 MG tablet Take 1,000 mg by mouth daily.    Marland Kitchen aspirin 81 MG tablet Take 81 mg by mouth daily.  Marland Kitchen azithromycin (ZITHROMAX) 250 MG tablet 1-2 puffs every 4-6 hours as needed  . b complex vitamins tablet Take 1 tablet by mouth daily.    Marland Kitchen FINASTERIDE PO Take 1 tablet by mouth daily.  . magnesium gluconate (MAGONATE) 500 MG tablet Take 500 mg by mouth daily.    . Nattokinase 100 MG CAPS Take by mouth daily.    Marland Kitchen omeprazole (PRILOSEC) 40 MG capsule Take 40 mg by mouth daily.  Marland Kitchen PROAIR HFA 108 (90 Base) MCG/ACT inhaler Inhale 2 puffs into the lungs every 4 (four) hours as needed.  . vitamin E 400 UNIT capsule Take 400 Units by mouth daily.    Marland Kitchen zinc gluconate 50 MG tablet Take 50 mg by mouth daily.                        Objective:   Physical Exam  amb wm nad   10/24/2014      170  > 11/11/2014  180 > 01/14/2015  180 > 12/13/2017  165     12/03/13 190 lb (86.183 kg)  11/21/13 185 lb (83.915 kg)  11/15/13 178 lb (80.74 kg)   Vital signs reviewed - Note on arrival 02 sats  95 % on RA     HEENT: nl dentition,  and oropharynx. Nl external ear canals without cough reflex - moderate bilateral non-specific turbinate edema     NECK :  without JVD/Nodes/TM/ nl carotid upstrokes bilaterally   LUNGS: no acc muscle use,  Nl contour chest with mid exp wheeze bilaterally better with plm    CV:  RRR  no s3 or murmur or increase in P2, and no edema   ABD:  soft and nontender with nl inspiratory excursion in the supine position. No bruits or organomegaly appreciated, bowel sounds nl  MS:  Nl gait/ ext warm without deformities, calf tenderness, cyanosis or clubbing No obvious joint restrictions   SKIN: warm and dry without lesions    NEURO:  alert, approp, nl sensorium with  no motor or cerebellar deficits apparent.        CXR PA and Lateral:   12/13/2017 :    I personally reviewed images and agree with radiology impression as follows:   Chronic bronchitic changes with mild superimposed increased interstitial density likely reflecting acute bronchitis. There is no alveolar pneumonia nor CHF.   Assessment:

## 2017-12-13 NOTE — Assessment & Plan Note (Signed)
rx zpak 12/08/17 and added prn dulera 100 / pred x 6 d and max gerd rx 12/13/2017   See copd a/p but doubt any further f/u will be needed based on how mild the copd is and how well he's done x several years on only ppi with bfast and no need for any inhalers so pulmonary f/u can be prn    I had an extended discussion with the patient reviewing all relevant studies completed to date and  lasting 25 minutes of a 40  minute acute office visit in pt not seen in several years     re  severe non-specific but potentially very serious refractory respiratory symptoms of uncertain and potentially multiple  etiologies.   Each maintenance medication was reviewed in detail including most importantly the difference between maintenance and prns and under what circumstances the prns are to be triggered using an action plan format that is not reflected in the computer generated alphabetically organized AVS.    Please see AVS for specific instructions unique to this office visit that I personally wrote and verbalized to the the pt in detail and then reviewed with pt  by my nurse highlighting any changes in therapy/plan of care  recommended at today's visit.

## 2017-12-15 ENCOUNTER — Telehealth: Payer: Self-pay

## 2017-12-15 NOTE — Telephone Encounter (Signed)
Called patient to schedule AWV and see if patient would like to establish with new provider as well. Patient was previous patient of Dr. Everlene Farrier. Patient declined to schedule and said he had a new primary care office.    Josepha Pigg, B.A.  Care Guide - [facility]

## 2017-12-25 NOTE — Progress Notes (Signed)
d  Patrick Rodriguez Date of Birth: [birth year] Medical Record #[id number]  History of Present Illness: Patrick Rodriguez is seen for follow up CAD and HTN. He has a history of HLD, HTN, CAD with remote stenting of the LAD in 2000, obesity, ED, syncope in 2013 felt to be due to dehydration. HCTZ was stopped at that time.  Stress Myoview 1/15 was normal with EF 70%. Monitor showed PVCs and PACs but no serious arrhythmia. He had a new anemia with Hgb 8.8. He underwent GI evaluation which demonstrated several polyps and diverticulosis. He was on iron therapy. Hgb is now normal.   On follow up today he is feeling  well. Not as active with exercise or traveling this year. [place]. He was treated for bronchitis 3 weeks ago and has recovered well from this.  Reports BP at home 536-644 systolic at rest.   Current Outpatient Medications on File Prior to Visit  Medication Sig Dispense Refill  . amLODipine (NORVASC) 5 MG tablet TAKE 1 TABLET BY MOUTH TWICE DAILY 180 tablet 0  . Ascorbic Acid (VITAMIN C) 1000 MG tablet Take 1,000 mg by mouth daily.      Marland Kitchen aspirin 81 MG tablet Take 81 mg by mouth daily.    Marland Kitchen azithromycin (ZITHROMAX) 250 MG tablet 1-2 puffs every 4-6 hours as needed    . b complex vitamins tablet Take 1 tablet by mouth daily.      Marland Kitchen FINASTERIDE PO Take 1 tablet by mouth daily.    . magnesium gluconate (MAGONATE) 500 MG tablet Take 500 mg by mouth daily.      . mometasone-formoterol (DULERA) 100-5 MCG/ACT AERO Inhale 2 puffs into the lungs 2 (two) times daily. 1 Inhaler 0  . omeprazole (PRILOSEC) 40 MG capsule Take 40 mg by mouth daily.    . vitamin E 400 UNIT capsule Take 400 Units by mouth daily.      Marland Kitchen zinc gluconate 50 MG tablet Take 50 mg by mouth daily.      . Nattokinase 100 MG CAPS Take by mouth daily.      . predniSONE (DELTASONE) 10 MG tablet Take  4 each am x 2 days,   2 each am x 2 days,  1 each am x 2 days and stop 14 tablet 0  . PROAIR HFA 108 (90 Base) MCG/ACT  inhaler Inhale 2 puffs into the lungs every 4 (four) hours as needed.    . [DISCONTINUED] losartan-hydrochlorothiazide (HYZAAR) 100-25 MG per tablet Take 1 tablet by mouth daily. 30 tablet 5   No current facility-administered medications on file prior to visit.     Allergies  Allergen Reactions  . Hctz [Hydrochlorothiazide]     Has had hyponatremia    Past Medical History:  Diagnosis Date  . Allergic rhinitis   . Carotid artery stenosis    12/2011:  50-69% on the left and 1-49% on the right;  Carotid US (11/2013): Bilateral 40-59%; repeat 1 year  . Cataract   . Coronary artery disease    a.  s/p stenting of the LAD in 2000;  b. Lexiscan Myoview (12/2013): No ischemia, EF 70%; normal study  . ED (erectile dysfunction)   . GERD (gastroesophageal reflux disease)   . Hyperlipidemia   . Hyperplastic colon polyp 11/15/2013   x3  . Hypertension   . Myocardial infarction ([place])   . Normal echocardiogram Jan 2013   EF of 55% and no wall motion abnormalities  . Normal  nuclear stress test 2008  . Obesity   . Palpitations    Holter (11/2013): NSR, sinus brady, PVCs, rare blocked PAC, no sig arrhythmia  . Substance abuse ([place])   . Syncope Jan 2013   felt to be vasovagal; had normal echo, carotids ok, 1st degree AV block with mention (but no tracing) of 2nd degree type I AV block while in [place]  . Tubular adenoma 11/15/2013   x2    Past Surgical History:  Procedure Laterality Date  . APPENDECTOMY    . CARDIAC CATHETERIZATION  01/20/1999   EF 60%  . CARDIOVASCULAR STRESS TEST  04/19/2007   EF 74%  . CORONARY STENT PLACEMENT  2000   LAD  . DOPPLER ECHOCARDIOGRAPHY  02/06/1998   EF 60%  . TONSILLECTOMY      Social History   Tobacco Use  Smoking Status Former Smoker  . Packs/day: 2.00  . Years: 28.00  . Pack years: 56.00  . Types: Cigarettes  . Last attempt to quit: 12/06/1968  . Years since quitting: 49.0  Smokeless Tobacco Never Used    Social History   Substance and Sexual  Activity  Alcohol Use No  . Alcohol/week: 0.0 oz    Family History  Problem Relation Age of Onset  . Aneurysm Mother   . Stomach cancer Father   . Cancer Father   . Cancer Sister     Review of Systems: As noted in history of present illness. All other systems were reviewed and are negative.  Physical Exam: BP (!) 156/58   Pulse 64   Ht 5\' 8"  (1.727 m)   Wt 159 lb (72.1 kg)   BMI 24.18 kg/m  GENERAL:  Well appearing elderly WM in NAD. HEENT:  PERRL, EOMI, sclera are clear. Oropharynx is clear. NECK:  No jugular venous distention, carotid upstroke brisk and symmetric, no bruits, no thyromegaly or adenopathy LUNGS:  Clear to auscultation bilaterally CHEST:  Unremarkable HEART:  RRR,  PMI not displaced or sustained,S1 and S2 within normal limits, no S3, no S4: no clicks, no rubs, no murmurs ABD:  Soft, nontender. BS +, no masses or bruits. No hepatomegaly, no splenomegaly EXT:  2 + pulses throughout, no edema, no cyanosis no clubbing SKIN:  Warm and dry.  No rashes NEURO:  Alert and oriented x 3. Cranial nerves II through XII intact. PSYCH:  Cognitively intact    LABORATORY DATA:   Ecg today shows NSR with first degree AV block. Otherwise normal. Rate 64 bpm. I have personally reviewed and interpreted this study.  Lab Results  Component Value Date   WBC 13.9 (H) 12/18/2015   HGB 13.5 12/18/2015   HCT 40.4 12/18/2015   PLT 491.0 (H) 12/18/2015   GLUCOSE 131 (H) 12/18/2015   CHOL 157 07/05/2013   TRIG 46.0 07/05/2013   HDL 38.90 (L) 07/05/2013   LDLCALC 109 (H) 07/05/2013   ALT 15 12/18/2015   AST 18 12/18/2015   NA 131 (L) 12/18/2015   K 4.2 12/18/2015   CL 100 12/18/2015   CREATININE 1.09 12/18/2015   BUN 21 12/18/2015   CO2 29 12/18/2015   TSH 1.813 06/19/2015   PSA 1.28 06/19/2015   INR 1.11 08/11/2011   Labs reviewed from 07/05/16: cholesterol 190, triglycerides 72, LDL 119, HDL 56. From 08/24/16- CMET normal.  Dated 07/18/17: cholesterol 188,  triglycerides 73, HDL 51, LDL 123. A1c 5.6%. Hgb 11.2. Creatinine 1.16. Other chemistries normal.   Assessment / Plan: 1. Coronary disease with prior stenting  of LAD in 2000. Myoview Jan. 2015 was normal. He remains asymptomatic. We'll continue medical therapy.  2. Hypertension blood pressure is somewhat elevated today but under good control at home. Continue amlodipine 5 mg bid. Encourage getting back into his exercise routine to maintain his conditioning.   3. Hyperlipidemia. LDL is a little high- we discussed statin therapy but he does not want to take additional medication at his age.   4. History of vasovagal syncope. None recently.    I will follow up in one year

## 2017-12-26 ENCOUNTER — Ambulatory Visit: Payer: Medicare Other | Admitting: Cardiology

## 2017-12-26 ENCOUNTER — Encounter: Payer: Self-pay | Admitting: Cardiology

## 2017-12-26 VITALS — BP 156/58 | HR 64 | Ht 68.0 in | Wt 159.0 lb

## 2017-12-26 DIAGNOSIS — E7800 Pure hypercholesterolemia, unspecified: Secondary | ICD-10-CM

## 2017-12-26 DIAGNOSIS — I10 Essential (primary) hypertension: Secondary | ICD-10-CM

## 2017-12-26 DIAGNOSIS — I2510 Atherosclerotic heart disease of native coronary artery without angina pectoris: Secondary | ICD-10-CM | POA: Diagnosis not present

## 2018-01-11 ENCOUNTER — Ambulatory Visit: Payer: Medicare Other | Admitting: Cardiology

## 2018-01-12 DIAGNOSIS — Z961 Presence of intraocular lens: Secondary | ICD-10-CM | POA: Diagnosis not present

## 2018-01-24 DIAGNOSIS — Z85828 Personal history of other malignant neoplasm of skin: Secondary | ICD-10-CM | POA: Diagnosis not present

## 2018-01-24 DIAGNOSIS — D225 Melanocytic nevi of trunk: Secondary | ICD-10-CM | POA: Diagnosis not present

## 2018-01-24 DIAGNOSIS — L821 Other seborrheic keratosis: Secondary | ICD-10-CM | POA: Diagnosis not present

## 2018-01-24 DIAGNOSIS — D692 Other nonthrombocytopenic purpura: Secondary | ICD-10-CM | POA: Diagnosis not present

## 2018-01-24 DIAGNOSIS — L570 Actinic keratosis: Secondary | ICD-10-CM | POA: Diagnosis not present

## 2018-02-23 ENCOUNTER — Other Ambulatory Visit: Payer: Self-pay | Admitting: Cardiology

## 2018-07-26 DIAGNOSIS — K219 Gastro-esophageal reflux disease without esophagitis: Secondary | ICD-10-CM | POA: Diagnosis not present

## 2018-07-26 DIAGNOSIS — I129 Hypertensive chronic kidney disease with stage 1 through stage 4 chronic kidney disease, or unspecified chronic kidney disease: Secondary | ICD-10-CM | POA: Diagnosis not present

## 2018-07-26 DIAGNOSIS — Z0000 Encounter for general adult medical examination without abnormal findings: Secondary | ICD-10-CM | POA: Diagnosis not present

## 2018-07-26 DIAGNOSIS — I2510 Atherosclerotic heart disease of native coronary artery without angina pectoris: Secondary | ICD-10-CM | POA: Diagnosis not present

## 2018-07-26 DIAGNOSIS — D500 Iron deficiency anemia secondary to blood loss (chronic): Secondary | ICD-10-CM | POA: Diagnosis not present

## 2018-07-26 DIAGNOSIS — Z1389 Encounter for screening for other disorder: Secondary | ICD-10-CM | POA: Diagnosis not present

## 2018-07-26 DIAGNOSIS — R739 Hyperglycemia, unspecified: Secondary | ICD-10-CM | POA: Diagnosis not present

## 2018-07-26 DIAGNOSIS — Z136 Encounter for screening for cardiovascular disorders: Secondary | ICD-10-CM | POA: Diagnosis not present

## 2018-08-02 NOTE — Progress Notes (Signed)
d  Patrick Rodriguez Date of Birth: 26-Mar-1925 Medical Record #[id number]  History of Present Illness: Patrick Rodriguez is seen for follow up CAD and HTN. He has a history of HLD, HTN, CAD with remote stenting of the LAD in 2000, obesity, ED, syncope in 2013 felt to be due to dehydration. HCTZ was stopped at that time.  Stress Myoview 1/15 was normal with EF 70%. Monitor showed PVCs and PACs but no serious arrhythmia. He had a new anemia with Hgb 8.8. He underwent GI evaluation which demonstrated several polyps and diverticulosis. He was on iron therapy. Hgb is now normal.   On follow up today he notes he is having more problems with his BP running high. Notes more equilibrium problems. BP running in 180s.  He is active and still travels. Reports labs done by Patrick Rodriguez showed some renal insufficiency and low Hgb.   Current Outpatient Medications on File Prior to Visit  Medication Sig Dispense Refill  . Ascorbic Acid (VITAMIN C) 1000 MG tablet Take 1,000 mg by mouth daily.      Marland Kitchen aspirin 81 MG tablet Take 81 mg by mouth daily.    Marland Kitchen b complex vitamins tablet Take 1 tablet by mouth daily.      . magnesium gluconate (MAGONATE) 500 MG tablet Take 500 mg by mouth daily.      . Nattokinase 100 MG CAPS Take by mouth daily.      Marland Kitchen omeprazole (PRILOSEC) 40 MG capsule Take 40 mg by mouth daily.    . vitamin E 400 UNIT capsule Take 400 Units by mouth daily.      Marland Kitchen zinc gluconate 50 MG tablet Take 50 mg by mouth daily.      . [DISCONTINUED] losartan-hydrochlorothiazide (HYZAAR) 100-25 MG per tablet Take 1 tablet by mouth daily. 30 tablet 5   No current facility-administered medications on file prior to visit.     Allergies  Allergen Reactions  . Hctz [Hydrochlorothiazide]     Has had hyponatremia    Past Medical History:  Diagnosis Date  . Allergic rhinitis   . Carotid artery stenosis    12/2011:  50-69% on the left and 1-49% on the right;  Carotid US (11/2013): Bilateral 40-59%; repeat 1 year  . Cataract    . Coronary artery disease    a.  s/p stenting of the LAD in 2000;  b. Lexiscan Myoview (12/2013): No ischemia, EF 70%; normal study  . ED (erectile dysfunction)   . GERD (gastroesophageal reflux disease)   . Hyperlipidemia   . Hyperplastic colon polyp 11/15/2013   x3  . Hypertension   . Myocardial infarction ([place])   . Normal echocardiogram Jan 2013   EF of 55% and no wall motion abnormalities  . Normal nuclear stress test 2008  . Obesity   . Palpitations    Holter (11/2013): NSR, sinus brady, PVCs, rare blocked PAC, no sig arrhythmia  . Substance abuse ([place])   . Syncope Jan 2013   felt to be vasovagal; had normal echo, carotids ok, 1st degree AV block with mention (but no tracing) of 2nd degree type I AV block while in [place]  . Tubular adenoma 11/15/2013   x2    Past Surgical History:  Procedure Laterality Date  . APPENDECTOMY    . CARDIAC CATHETERIZATION  01/20/1999   EF 60%  . CARDIOVASCULAR STRESS TEST  04/19/2007   EF 74%  . CORONARY STENT PLACEMENT  2000   LAD  . DOPPLER ECHOCARDIOGRAPHY  02/06/1998   EF 60%  . TONSILLECTOMY      Social History   Tobacco Use  Smoking Status Former Smoker  . Packs/day: 2.00  . Years: 28.00  . Pack years: 56.00  . Types: Cigarettes  . Last attempt to quit: 12/06/1968  . Years since quitting: 49.6  Smokeless Tobacco Never Used    Social History   Substance and Sexual Activity  Alcohol Use No  . Alcohol/week: 0.0 standard drinks    Family History  Problem Relation Age of Onset  . Aneurysm Mother   . Stomach cancer Father   . Cancer Father   . Cancer Sister     Review of Systems: As noted in history of present illness. All other systems were reviewed and are negative.  Physical Exam: BP (!) 172/60 (BP Location: Right Arm, Patient Position: Sitting, Cuff Size: Normal)   Pulse 67   Ht 5\' 8"  (1.727 m)   Wt 160 lb (72.6 kg)   BMI 24.33 kg/m  BP equal in both arms. GENERAL:  Well appearing elderly WM in NAD. HEENT:   PERRL, EOMI, sclera are clear. Oropharynx is clear. NECK:  No jugular venous distention, carotid upstroke brisk and symmetric, soft right carotid bruit, no thyromegaly or adenopathy LUNGS:  Clear to auscultation bilaterally CHEST:  Unremarkable HEART:  RRR,  PMI not displaced or sustained,S1 and S2 within normal limits, no S3, no S4: no clicks, no rubs, no murmurs ABD:  Soft, nontender. BS +, no masses or bruits. No hepatomegaly, no splenomegaly EXT:  2 + pulses throughout, no edema, no cyanosis no clubbing SKIN:  Warm and dry.  No rashes NEURO:  Alert and oriented x 3. Cranial nerves II through XII intact. PSYCH:  Cognitively intact    LABORATORY DATA:    Lab Results  Component Value Date   WBC 13.9 (H) 12/18/2015   HGB 13.5 12/18/2015   HCT 40.4 12/18/2015   PLT 491.0 (H) 12/18/2015   GLUCOSE 131 (H) 12/18/2015   CHOL 157 07/05/2013   TRIG 46.0 07/05/2013   HDL 38.90 (L) 07/05/2013   LDLCALC 109 (H) 07/05/2013   ALT 15 12/18/2015   AST 18 12/18/2015   NA 131 (L) 12/18/2015   K 4.2 12/18/2015   CL 100 12/18/2015   CREATININE 1.09 12/18/2015   BUN 21 12/18/2015   CO2 29 12/18/2015   TSH 1.813 06/19/2015   PSA 1.28 06/19/2015   INR 1.11 08/11/2011   Labs reviewed from 07/05/16: cholesterol 190, triglycerides 72, LDL 119, HDL 56. From 08/24/16- CMET normal.  Dated 07/18/17: cholesterol 188, triglycerides 73, HDL 51, LDL 123. A1c 5.6%. Hgb 11.2. Creatinine 1.16. Other chemistries normal.   Assessment / Plan: 1. Coronary disease with prior stenting of LAD in 2000. Myoview Jan. 2015 was normal. He remains asymptomatic. We'll continue medical therapy.  2. Hypertension blood pressure is high. Primarily systolic HTN, I would avoid diuretics or ARB at this point with some CKD. he has taken Cardura in the past without side effects. I have recommended resuming Cardura 2 mg at bedtime and change amlodipine to 10 mg in the am. Sodium restriction.   3. Hyperlipidemia. LDL is elevated  but he has not wanted to take statins.   4. History of vasovagal syncope. None recently.  5. Carotid arterial disease. 40-59% bilateral by dopplers in 2014. Will update.    I will follow up in 4 months.

## 2018-08-03 ENCOUNTER — Ambulatory Visit: Payer: Medicare Other | Admitting: Cardiology

## 2018-08-03 ENCOUNTER — Encounter

## 2018-08-03 ENCOUNTER — Encounter: Payer: Self-pay | Admitting: Cardiology

## 2018-08-03 VITALS — BP 172/60 | HR 67 | Ht 68.0 in | Wt 160.0 lb

## 2018-08-03 DIAGNOSIS — I10 Essential (primary) hypertension: Secondary | ICD-10-CM

## 2018-08-03 DIAGNOSIS — E7800 Pure hypercholesterolemia, unspecified: Secondary | ICD-10-CM | POA: Diagnosis not present

## 2018-08-03 DIAGNOSIS — I2510 Atherosclerotic heart disease of native coronary artery without angina pectoris: Secondary | ICD-10-CM | POA: Diagnosis not present

## 2018-08-03 MED ORDER — DOXAZOSIN MESYLATE 2 MG PO TABS: 2.0000 mg | ORAL_TABLET | Freq: Every day | ORAL | 3 refills | Status: DC

## 2018-08-03 MED ORDER — AMLODIPINE BESYLATE 10 MG PO TABS: 10.0000 mg | ORAL_TABLET | Freq: Every day | ORAL | 3 refills | Status: DC

## 2018-08-03 NOTE — Patient Instructions (Addendum)
Take amlodipine 10 mg daily  We will start Cardura 2 mg daily  We will check carotid dopplers.  I will see you in 4 months

## 2018-08-11 ENCOUNTER — Ambulatory Visit: Payer: Medicare Other | Admitting: Physician Assistant

## 2018-08-11 ENCOUNTER — Other Ambulatory Visit: Payer: Self-pay | Admitting: Cardiology

## 2018-08-11 DIAGNOSIS — I10 Essential (primary) hypertension: Secondary | ICD-10-CM

## 2018-08-11 DIAGNOSIS — E7800 Pure hypercholesterolemia, unspecified: Secondary | ICD-10-CM

## 2018-08-11 DIAGNOSIS — I6529 Occlusion and stenosis of unspecified carotid artery: Secondary | ICD-10-CM

## 2018-08-14 DIAGNOSIS — N183 Chronic kidney disease, stage 3 (moderate): Secondary | ICD-10-CM | POA: Diagnosis not present

## 2018-08-17 ENCOUNTER — Ambulatory Visit (HOSPITAL_COMMUNITY)
Admission: RE | Admit: 2018-08-17 | Discharge: 2018-08-17 | Disposition: A | Discharge status: Home / Self Care | Payer: Medicare Other | Source: Ambulatory Visit | Attending: Cardiology | Admitting: Cardiology

## 2018-08-17 DIAGNOSIS — I10 Essential (primary) hypertension: Secondary | ICD-10-CM | POA: Insufficient documentation

## 2018-08-17 DIAGNOSIS — I6529 Occlusion and stenosis of unspecified carotid artery: Secondary | ICD-10-CM | POA: Diagnosis not present

## 2018-08-17 DIAGNOSIS — E7800 Pure hypercholesterolemia, unspecified: Secondary | ICD-10-CM | POA: Insufficient documentation

## 2018-11-01 NOTE — Progress Notes (Signed)
d  KAELEN CAUGHLIN Date of Birth: Feb 08, 1925 Medical Record #[id number]  History of Present Illness: Mr. Haskell is seen for follow up CAD and HTN. He has a history of HLD, HTN, CAD with remote stenting of the LAD in 2000, obesity, ED, syncope in 2013 felt to be due to dehydration. HCTZ was stopped at that time. He also had hyponatremia.  Stress Myoview 1/15 was normal with EF 70%. Monitor showed PVCs and PACs but no serious arrhythmia. He had a new anemia with Hgb 8.8. He underwent GI evaluation which demonstrated several polyps and diverticulosis. He was on iron therapy. Hgb is now normal.   On his last visit we added Cardura 2 mg qhs for persistent HTN.   On follow up today he notes his BP may still be high in the evening. Up to 681 systolic when supine.  Finds it harder to exercise now. His legs tire more easily. He has some mild vertigo at times. No chest pain or dyspnea.   Current Outpatient Medications on File Prior to Visit  Medication Sig Dispense Refill  . amLODipine (NORVASC) 10 MG tablet Take 1 tablet (10 mg total) by mouth daily. 90 tablet 3  . Ascorbic Acid (VITAMIN C) 1000 MG tablet Take 1,000 mg by mouth daily.      Marland Kitchen aspirin 81 MG tablet Take 81 mg by mouth daily.    Marland Kitchen b complex vitamins tablet Take 1 tablet by mouth daily.      Marland Kitchen doxazosin (CARDURA) 2 MG tablet Take 1 tablet (2 mg total) by mouth at bedtime. 90 tablet 3  . magnesium gluconate (MAGONATE) 500 MG tablet Take 500 mg by mouth daily.      . Nattokinase 100 MG CAPS Take by mouth daily.      Marland Kitchen omeprazole (PRILOSEC) 40 MG capsule Take 40 mg by mouth daily.    . Red Yeast Rice Extract (RED YEAST RICE PO) Take 1 tablet by mouth 3 (three) times a week.    . vitamin E 400 UNIT capsule Take 400 Units by mouth daily.      Marland Kitchen zinc gluconate 50 MG tablet Take 50 mg by mouth daily.       No current facility-administered medications on file prior to visit.     Allergies  Allergen Reactions  . Hctz [Hydrochlorothiazide]     Has  had hyponatremia    Past Medical History:  Diagnosis Date  . Allergic rhinitis   . Carotid artery stenosis    12/2011:  50-69% on the left and 1-49% on the right;  Carotid US (11/2013): Bilateral 40-59%; repeat 1 year  . Cataract   . Coronary artery disease    a.  s/p stenting of the LAD in 2000;  b. Lexiscan Myoview (12/2013): No ischemia, EF 70%; normal study  . ED (erectile dysfunction)   . GERD (gastroesophageal reflux disease)   . Hyperlipidemia   . Hyperplastic colon polyp 11/15/2013   x3  . Hypertension   . Myocardial infarction ([place])   . Normal echocardiogram Jan 2013   EF of 55% and no wall motion abnormalities  . Normal nuclear stress test 2008  . Obesity   . Palpitations    Holter (11/2013): NSR, sinus brady, PVCs, rare blocked PAC, no sig arrhythmia  . Substance abuse ([place])   . Syncope Jan 2013   felt to be vasovagal; had normal echo, carotids ok, 1st degree AV block with mention (but no tracing) of 2nd degree type I AV  block while in [place]  . Tubular adenoma 11/15/2013   x2    Past Surgical History:  Procedure Laterality Date  . APPENDECTOMY    . CARDIAC CATHETERIZATION  01/20/1999   EF 60%  . CARDIOVASCULAR STRESS TEST  04/19/2007   EF 74%  . CORONARY STENT PLACEMENT  2000   LAD  . DOPPLER ECHOCARDIOGRAPHY  02/06/1998   EF 60%  . TONSILLECTOMY      Social History   Tobacco Use  Smoking Status Former Smoker  . Packs/day: 2.00  . Years: 28.00  . Pack years: 56.00  . Types: Cigarettes  . Last attempt to quit: 12/06/1968  . Years since quitting: 49.9  Smokeless Tobacco Never Used    Social History   Substance and Sexual Activity  Alcohol Use No  . Alcohol/week: 0.0 standard drinks    Family History  Problem Relation Age of Onset  . Aneurysm Mother   . Stomach cancer Father   . Cancer Father   . Cancer Sister     Review of Systems: As noted in history of present illness. All other systems were reviewed and are negative.  Physical Exam: BP (!)  148/60   Pulse 70   Ht 5\' 8"  (1.727 m)   Wt 170 lb 9.6 oz (77.4 kg)   BMI 25.94 kg/m   GENERAL:  Well appearing, elderly WM in NAD HEENT:  PERRL, EOMI, sclera are clear. Oropharynx is clear. NECK:  No jugular venous distention, carotid upstroke brisk and symmetric, no bruits, no thyromegaly or adenopathy LUNGS:  Clear to auscultation bilaterally CHEST:  Unremarkable HEART:  RRR,  PMI not displaced or sustained,S1 and S2 within normal limits, no S3, no S4: no clicks, no rubs, no murmurs ABD:  Soft, nontender. BS +, no masses or bruits. No hepatomegaly, no splenomegaly EXT:  2 + pulses throughout, 1+ edema, no cyanosis no clubbing SKIN:  Warm and dry.  No rashes NEURO:  Alert and oriented x 3. Cranial nerves II through XII intact. PSYCH:  Cognitively intact       LABORATORY DATA:    Lab Results  Component Value Date   WBC 13.9 (H) 12/18/2015   HGB 13.5 12/18/2015   HCT 40.4 12/18/2015   PLT 491.0 (H) 12/18/2015   GLUCOSE 131 (H) 12/18/2015   CHOL 157 07/05/2013   TRIG 46.0 07/05/2013   HDL 38.90 (L) 07/05/2013   LDLCALC 109 (H) 07/05/2013   ALT 15 12/18/2015   AST 18 12/18/2015   NA 131 (L) 12/18/2015   K 4.2 12/18/2015   CL 100 12/18/2015   CREATININE 1.09 12/18/2015   BUN 21 12/18/2015   CO2 29 12/18/2015   TSH 1.813 06/19/2015   PSA 1.28 06/19/2015   INR 1.11 08/11/2011   Labs reviewed from 07/05/16: cholesterol 190, triglycerides 72, LDL 119, HDL 56. From 08/24/16- CMET normal.  Dated 07/18/17: cholesterol 188, triglycerides 73, HDL 51, LDL 123. A1c 5.6%. Hgb 11.2. Creatinine 1.16. Other chemistries normal. Dated 07/26/18: cholesterol 194, triglycerides 56, HDL 55, LDL 127. A1c 5.4%. Hgb 11.5. Creatinine 1.31. Potassium and ALT normal.  Carotid dopplers in September 8921 showed 19-41% LICA stenosis. RICA looked good.   Ecg today  Shows NSR with first degree AV block. I have personally reviewed and interpreted this study.   Assessment / Plan: 1. Coronary  disease with prior stenting of LAD in 2000. Myoview Jan. 2015 was normal. He remains asymptomatic. We'll continue medical therapy.  2. Hypertension blood pressure improved with Cardura but  still not ideal. I am concerned that if we are too aggressive with BP control this could lead to dizziness or syncope at his advanced age and with low diastolic BP.  Primarily systolic HTN, I would avoid diuretics or ARB at this point with some CKD. he has taken will continue with permissive HTN control. Sodium restriction.   3. Hyperlipidemia. LDL is elevated but he has not wanted to take statins.   4. History of vasovagal syncope. None recently.  5. Carotid arterial disease. 41-44% LICA     I will follow up in 6 months.

## 2018-11-06 ENCOUNTER — Encounter: Payer: Self-pay | Admitting: Cardiology

## 2018-11-06 ENCOUNTER — Ambulatory Visit: Payer: Medicare Other | Admitting: Cardiology

## 2018-11-06 VITALS — BP 148/60 | HR 70 | Ht 68.0 in | Wt 170.6 lb

## 2018-11-06 DIAGNOSIS — I2510 Atherosclerotic heart disease of native coronary artery without angina pectoris: Secondary | ICD-10-CM | POA: Diagnosis not present

## 2018-11-06 DIAGNOSIS — E7800 Pure hypercholesterolemia, unspecified: Secondary | ICD-10-CM

## 2018-11-06 DIAGNOSIS — I10 Essential (primary) hypertension: Secondary | ICD-10-CM

## 2019-01-30 DIAGNOSIS — L821 Other seborrheic keratosis: Secondary | ICD-10-CM | POA: Diagnosis not present

## 2019-01-30 DIAGNOSIS — L723 Sebaceous cyst: Secondary | ICD-10-CM | POA: Diagnosis not present

## 2019-01-30 DIAGNOSIS — L570 Actinic keratosis: Secondary | ICD-10-CM | POA: Diagnosis not present

## 2019-01-30 DIAGNOSIS — C4442 Squamous cell carcinoma of skin of scalp and neck: Secondary | ICD-10-CM | POA: Diagnosis not present

## 2019-01-30 DIAGNOSIS — D225 Melanocytic nevi of trunk: Secondary | ICD-10-CM | POA: Diagnosis not present

## 2019-01-30 DIAGNOSIS — Z85828 Personal history of other malignant neoplasm of skin: Secondary | ICD-10-CM | POA: Diagnosis not present

## 2019-02-21 DIAGNOSIS — Z961 Presence of intraocular lens: Secondary | ICD-10-CM | POA: Diagnosis not present

## 2019-04-12 ENCOUNTER — Other Ambulatory Visit (HOSPITAL_COMMUNITY): Payer: Self-pay | Admitting: Cardiology

## 2019-04-12 DIAGNOSIS — I6523 Occlusion and stenosis of bilateral carotid arteries: Secondary | ICD-10-CM

## 2019-04-19 DIAGNOSIS — U071 COVID-19: Secondary | ICD-10-CM | POA: Diagnosis not present

## 2019-06-12 DIAGNOSIS — C44729 Squamous cell carcinoma of skin of left lower limb, including hip: Secondary | ICD-10-CM | POA: Diagnosis not present

## 2019-06-12 DIAGNOSIS — C44622 Squamous cell carcinoma of skin of right upper limb, including shoulder: Secondary | ICD-10-CM | POA: Diagnosis not present

## 2019-06-12 DIAGNOSIS — C44629 Squamous cell carcinoma of skin of left upper limb, including shoulder: Secondary | ICD-10-CM | POA: Diagnosis not present

## 2019-06-18 ENCOUNTER — Ambulatory Visit (INDEPENDENT_AMBULATORY_CARE_PROVIDER_SITE_OTHER): Payer: Medicare Other | Admitting: Physician Assistant

## 2019-06-18 ENCOUNTER — Other Ambulatory Visit: Payer: Self-pay

## 2019-06-18 VITALS — BP 156/56 | HR 77 | Temp 97.3°F | Ht 68.0 in | Wt 156.0 lb

## 2019-06-18 DIAGNOSIS — I6529 Occlusion and stenosis of unspecified carotid artery: Secondary | ICD-10-CM

## 2019-06-18 DIAGNOSIS — R55 Syncope and collapse: Secondary | ICD-10-CM | POA: Diagnosis not present

## 2019-06-18 DIAGNOSIS — E7800 Pure hypercholesterolemia, unspecified: Secondary | ICD-10-CM

## 2019-06-18 DIAGNOSIS — I2510 Atherosclerotic heart disease of native coronary artery without angina pectoris: Secondary | ICD-10-CM | POA: Diagnosis not present

## 2019-06-18 DIAGNOSIS — I10 Essential (primary) hypertension: Secondary | ICD-10-CM | POA: Diagnosis not present

## 2019-06-18 MED ORDER — DOXAZOSIN MESYLATE 4 MG PO TABS: 4.0000 mg | ORAL_TABLET | Freq: Every day | ORAL | 1 refills | Status: DC

## 2019-06-18 NOTE — Progress Notes (Signed)
Cardiology Office Note    Date:  06/20/2019   ID:  Patrick Rodriguez, DOB 07-11-1925, MRN [id number]  PCP:  Maury Dus, MD  Cardiologist:  Dr. Martinique   Chief Complaint  Patient presents with   Follow-up    seen for Dr. Martinique.     History of Present Illness:  Patrick Rodriguez is a 85 y.o. male with PMH of HTN, HLD, carotid artery disease, and CAD.  Patient had a syncope in 2013 which was felt to be due to dehydration.  HCTZ was stopped at the time he also had hyponatremia.  Stress Myoview in January 2015 was normal with EF 70%.  Heart monitor showed PVCs and PACs but no significant ventricular ectopy.  Due to history of anemia, he underwent a GI evaluation that demonstrated polyps and diverticulosis.  Patient was last seen by Dr. Martinique in December 2019, his blood pressure was 148/60 at the time.  Blood pressure improved with Cardura however it was felt aggressive blood pressure control may lead to worsening dizziness and the recurrent syncope, therefore no further medication changes were made.  Patient presents today for cardiology office visit.  He denies any recent chest pain, he does mention his shortness of breath is getting a little bit worse.  One of his major concern recently was his elevated blood pressure.  He says his home systolic blood pressure has been ranging in the 150-170s.  He occasionally also has a headache as well with elevated blood pressure.  His blood pressure in the office today was 156/56.  I recommended increasing Cardura to 4 mg daily.  Otherwise he did not have any lower extremity edema or any symptoms to suggest heart failure.   Past Medical History:  Diagnosis Date   Allergic rhinitis    Carotid artery stenosis    12/2011:  50-69% on the left and 1-49% on the right;  Carotid US (11/2013): Bilateral 40-59%; repeat 1 year   Cataract    Coronary artery disease    a.  s/p stenting of the LAD in 2000;  b. Lexiscan Myoview (12/2013): No ischemia, EF 70%; normal  study   ED (erectile dysfunction)    GERD (gastroesophageal reflux disease)    Hyperlipidemia    Hyperplastic colon polyp 11/15/2013   x3   Hypertension    Myocardial infarction [facility])    Normal echocardiogram Jan 2013   EF of 55% and no wall motion abnormalities   Normal nuclear stress test 2008   Obesity    Palpitations    Holter (11/2013): NSR, sinus brady, PVCs, rare blocked PAC, no sig arrhythmia   Substance abuse ([place])    Syncope Jan 2013   felt to be vasovagal; had normal echo, carotids ok, 1st degree AV block with mention (but no tracing) of 2nd degree type I AV block while in [place]   Tubular adenoma 11/15/2013   x2    Past Surgical History:  Procedure Laterality Date   APPENDECTOMY     CARDIAC CATHETERIZATION  01/20/1999   EF 60%   CARDIOVASCULAR STRESS TEST  04/19/2007   EF 74%   CORONARY STENT PLACEMENT  2000   LAD   DOPPLER ECHOCARDIOGRAPHY  02/06/1998   EF 60%   TONSILLECTOMY      Current Medications: Outpatient Medications Prior to Visit  Medication Sig Dispense Refill   amLODipine (NORVASC) 10 MG tablet Take 1 tablet (10 mg total) by mouth daily. 90 tablet 3   Ascorbic Acid (VITAMIN C) 1000  MG tablet Take 1,000 mg by mouth daily.       aspirin 81 MG tablet Take 81 mg by mouth daily.     b complex vitamins tablet Take 1 tablet by mouth daily.       magnesium gluconate (MAGONATE) 500 MG tablet Take 500 mg by mouth daily.       Nattokinase 100 MG CAPS Take by mouth daily.       omeprazole (PRILOSEC) 40 MG capsule Take 40 mg by mouth daily.     Red Yeast Rice Extract (RED YEAST RICE PO) Take 1 tablet by mouth 3 (three) times a week.     vitamin E 400 UNIT capsule Take 400 Units by mouth daily.       zinc gluconate 50 MG tablet Take 50 mg by mouth daily.       doxazosin (CARDURA) 2 MG tablet Take 1 tablet (2 mg total) by mouth at bedtime. 90 tablet 3   No facility-administered medications prior to visit.      Allergies:   Hctz  [hydrochlorothiazide]   Social History   Socioeconomic History   Marital status: Married    Spouse name: Not on file   Number of children: Y   Years of education: Not on file   Highest education level: Not on file  Occupational History   Occupation: traveling Company secretary strain: Not on file   Food insecurity    Worry: Not on file    Inability: Not on file   Transportation needs    Medical: Not on file    Non-medical: Not on file  Tobacco Use   Smoking status: Former Smoker    Packs/day: 2.00    Years: 28.00    Pack years: 56.00    Types: Cigarettes    Quit date: 12/06/1968    Years since quitting: 50.5   Smokeless tobacco: Never Used  Substance and Sexual Activity   Alcohol use: No    Alcohol/week: 0.0 standard drinks   Drug use: No   Sexual activity: Not on file  Lifestyle   Physical activity    Days per week: Not on file    Minutes per session: Not on file   Stress: Not on file  Relationships   Social connections    Talks on phone: Not on file    Gets together: Not on file    Attends religious service: Not on file    Active member of club or organization: Not on file    Attends meetings of clubs or organizations: Not on file    Relationship status: Not on file  Other Topics Concern   Not on file  Social History Narrative   Not on file     Family History:  The patient's family history includes Aneurysm in his mother; Cancer in his father and sister; Stomach cancer in his father.   ROS:   Please see the history of present illness.    ROS All other systems reviewed and are negative.   PHYSICAL EXAM:   VS:  BP (!) 156/56    Pulse 77    Temp (!) 97.3 F (36.3 C)    Ht 5\' 8"  (1.727 m)    Wt 156 lb (70.8 kg)    SpO2 97%    BMI 23.72 kg/m    GEN: Well nourished, well developed, in no acute distress  HEENT: normal  Neck: no JVD, carotid bruits, or masses Cardiac: RRR; no  murmurs, rubs, or gallops,no edema    Respiratory:  clear to auscultation bilaterally, normal work of breathing GI: soft, nontender, nondistended, + BS MS: no deformity or atrophy  Skin: warm and dry, no rash Neuro:  Alert and Oriented x 3, Strength and sensation are intact Psych: euthymic mood, full affect  Wt Readings from Last 3 Encounters:  06/18/19 156 lb (70.8 kg)  11/06/18 170 lb 9.6 oz (77.4 kg)  08/03/18 160 lb (72.6 kg)      Studies/Labs Reviewed:   EKG:  EKG is not ordered today.    Recent Labs: No results found for requested labs within last 8760 hours.   Lipid Panel    Component Value Date/Time   CHOL 157 07/05/2013 0915   TRIG 46.0 07/05/2013 0915   HDL 38.90 (L) 07/05/2013 0915   CHOLHDL 4 07/05/2013 0915   VLDL 9.2 07/05/2013 0915   LDLCALC 109 (H) 07/05/2013 0915    Additional studies/ records that were reviewed today include:   Myoview 12/11/2013 Overall Impression:  Normal stress nuclear study. This is a low-risk scan. There is no scar or ischemia. There is no change in the study from the report of the study of 2008. The patient did have PVCs that were scattered throughout the study.  LV Ejection Fraction: 70%.  LV Wall Motion:  Normal Wall Motion.    ASSESSMENT:    1. Coronary artery disease involving native coronary artery of native heart without angina pectoris   2. Essential hypertension   3. Hypercholesterolemia   4. Stenosis of carotid artery, unspecified laterality   5. Vasovagal syncope      PLAN:  In order of problems listed above:  1. CAD: Continue aspirin.  Denies any recent chest pain.  Last Myoview was in 2015.  2. Hypertension: Blood pressure continue to be borderline elevated.  Will increase Cardura to 4 mg daily  3. Hyperlipidemia: Continue red yeast rice.  4. Carotid artery disease: Last carotid Doppler obtained in September 2019 showed 40 to 59% left carotid artery disease, no significant disease on the right side.  Continue medical therapy with aspirin  daily.  Need to repeat carotid Doppler on the next office visit.  5. History of vasovagal syncope: No recurrence    Medication Adjustments/Labs and Tests Ordered: Current medicines are reviewed at length with the patient today.  Concerns regarding medicines are outlined above.  Medication changes, Labs and Tests ordered today are listed in the Patient Instructions below. Patient Instructions  Medication Instructions:   INCREASE CARDURA TO 4 MG DAILY  If you need a refill on your cardiac medications before your next appointment, please call your pharmacy.   Lab work: NONE ordered at this time of appointment   If you have labs (blood work) drawn today and your tests are completely normal, you will receive your results only by:  [place] (if you have MyChart) OR  A paper copy in the mail If you have any lab test that is abnormal or we need to change your treatment, we will call you to review the results.  Testing/Procedures: NONE ordered at this time of appointment   Follow-Up: At [facility], you and your health needs are our priority.  As part of our continuing mission to provide you with exceptional heart care, we have created designated Provider Care Teams.  These Care Teams include your primary Cardiologist (physician) and Advanced Practice Providers (APPs -  Physician Assistants and Nurse Practitioners) who all work together to provide you  with the care you need, when you need it. You will need a follow up appointment in 3-4 weeks with Peter Martinique, MD or Almyra Deforest, PA-C   Any Other Special Instructions Will Be Listed Below (If Applicable).  Keep a blood pressure log with 2 readings a day and you may take an additional reading you are feeling light headed and/or dizziness      Weston Brass Almyra Deforest, [place]  06/20/2019 2:30 PM    [address], [place], [place]  [address] Phone: [phone]; Fax: [phone]

## 2019-06-18 NOTE — Patient Instructions (Signed)
Medication Instructions:   INCREASE CARDURA TO 4 MG DAILY  If you need a refill on your cardiac medications before your next appointment, please call your pharmacy.   Lab work: NONE ordered at this time of appointment   If you have labs (blood work) drawn today and your tests are completely normal, you will receive your results only by: Marland Kitchen MyChart Message (if you have MyChart) OR . A paper copy in the mail If you have any lab test that is abnormal or we need to change your treatment, we will call you to review the results.  Testing/Procedures: NONE ordered at this time of appointment   Follow-Up: At [facility], you and your health needs are our priority.  As part of our continuing mission to provide you with exceptional heart care, we have created designated Provider Care Teams.  These Care Teams include your primary Cardiologist (physician) and Advanced Practice Providers (APPs -  Physician Assistants and Nurse Practitioners) who all work together to provide you with the care you need, when you need it. You will need a follow up appointment in 3-4 weeks with Peter Martinique, MD or Almyra Deforest, PA-C   Any Other Special Instructions Will Be Listed Below (If Applicable).  Keep a blood pressure log with 2 readings a day and you may take an additional reading you are feeling light headed and/or dizziness

## 2019-06-20 ENCOUNTER — Encounter: Payer: Self-pay | Admitting: Physician Assistant

## 2019-07-17 ENCOUNTER — Other Ambulatory Visit: Payer: Self-pay

## 2019-07-17 ENCOUNTER — Ambulatory Visit (INDEPENDENT_AMBULATORY_CARE_PROVIDER_SITE_OTHER): Payer: Medicare Other | Admitting: Physician Assistant

## 2019-07-17 VITALS — BP 173/61 | HR 74 | Ht 68.5 in | Wt 160.0 lb

## 2019-07-17 DIAGNOSIS — I10 Essential (primary) hypertension: Secondary | ICD-10-CM

## 2019-07-17 DIAGNOSIS — E785 Hyperlipidemia, unspecified: Secondary | ICD-10-CM

## 2019-07-17 DIAGNOSIS — I2510 Atherosclerotic heart disease of native coronary artery without angina pectoris: Secondary | ICD-10-CM

## 2019-07-17 DIAGNOSIS — R29898 Other symptoms and signs involving the musculoskeletal system: Secondary | ICD-10-CM | POA: Diagnosis not present

## 2019-07-17 NOTE — Progress Notes (Signed)
Cardiology Office Note    Date:  07/18/2019   ID:  Patrick Cruise., DOB 1925/04/01, MRN [id number]  PCP:  Maury Dus, MD  Cardiologist:  Dr. Martinique   Chief Complaint  Patient presents with  . Follow-up    seen for Dr. Martinique.     History of Present Illness:  Patrick Szilagyi. is a 85 y.o. male with PMH of HTN, HLD, carotid artery disease, and CAD.  Patient had a syncope in 2013 which was felt to be due to dehydration.  HCTZ was stopped at the time he also had hyponatremia.  Stress Myoview in January 2015 was normal with EF 70%.  Heart monitor showed PVCs and PACs but no significant ventricular ectopy.  Due to history of anemia, he underwent a GI evaluation that demonstrated polyps and diverticulosis.  Patient was last seen by Dr. Martinique in December 2019, his blood pressure was 148/60 at the time.  Blood pressure improved with Cardura however it was felt aggressive blood pressure control may lead to worsening dizziness and recurrent syncope, therefore no further medication changes were made.  I last saw the patient on 06/18/2019, he denied any chest discomfort at the time.  His blood pressure has been ranging in the 150-170s, therefore I increased his Cardura to 40 mg daily.  He returns today for cardiology follow-up.  His initial blood pressure was 173/61.  Manual recheck by myself 10 minutes later was 150/40.  He denies any recent dizzy spell.  He is a traveling Theme park manager.  He was in [place] back in March.  This morning he just returned from [place].  He says he does notice some lower extremity weakness, worse on the left side.  He has decreased pulses in the left lower extremity, I would not be too surprised if he has some degree of PAD.  He denies any claudication symptoms at rest.  For the time being I recommend continuing the current medication and monitor the degree of lower extremity weakness.  He says the lower extremity weakness can be overcome by increase walking distance.   Past  Medical History:  Diagnosis Date  . Allergic rhinitis   . Carotid artery stenosis    12/2011:  50-69% on the left and 1-49% on the right;  Carotid US (11/2013): Bilateral 40-59%; repeat 1 year  . Cataract   . Coronary artery disease    a.  s/p stenting of the LAD in 2000;  b. Lexiscan Myoview (12/2013): No ischemia, EF 70%; normal study  . ED (erectile dysfunction)   . GERD (gastroesophageal reflux disease)   . Hyperlipidemia   . Hyperplastic colon polyp 11/15/2013   x3  . Hypertension   . Myocardial infarction ([place])   . Normal echocardiogram Jan 2013   EF of 55% and no wall motion abnormalities  . Normal nuclear stress test 2008  . Obesity   . Palpitations    Holter (11/2013): NSR, sinus brady, PVCs, rare blocked PAC, no sig arrhythmia  . Substance abuse ([place])   . Syncope Jan 2013   felt to be vasovagal; had normal echo, carotids ok, 1st degree AV block with mention (but no tracing) of 2nd degree type I AV block while in [place]  . Tubular adenoma 11/15/2013   x2    Past Surgical History:  Procedure Laterality Date  . APPENDECTOMY    . CARDIAC CATHETERIZATION  01/20/1999   EF 60%  . CARDIOVASCULAR STRESS TEST  04/19/2007   EF 74%  .  CORONARY STENT PLACEMENT  2000   LAD  . DOPPLER ECHOCARDIOGRAPHY  02/06/1998   EF 60%  . TONSILLECTOMY      Current Medications: Outpatient Medications Prior to Visit  Medication Sig Dispense Refill  . amLODipine (NORVASC) 10 MG tablet Take 1 tablet (10 mg total) by mouth daily. 90 tablet 3  . Ascorbic Acid (VITAMIN C) 1000 MG tablet Take 1,000 mg by mouth daily.      Marland Kitchen aspirin 81 MG tablet Take 81 mg by mouth daily.    Marland Kitchen b complex vitamins tablet Take 1 tablet by mouth daily.      Marland Kitchen doxazosin (CARDURA) 4 MG tablet Take 1 tablet (4 mg total) by mouth daily. 90 tablet 1  . magnesium gluconate (MAGONATE) 500 MG tablet Take 500 mg by mouth daily.      . Nattokinase 100 MG CAPS Take by mouth daily.      Marland Kitchen omeprazole (PRILOSEC) 40 MG capsule Take 40  mg by mouth daily.    . Red Yeast Rice Extract (RED YEAST RICE PO) Take 1 tablet by mouth 3 (three) times a week.    . vitamin E 400 UNIT capsule Take 400 Units by mouth daily.      Marland Kitchen zinc gluconate 50 MG tablet Take 50 mg by mouth daily.       No facility-administered medications prior to visit.      Allergies:   Hctz [hydrochlorothiazide]   Social History   Socioeconomic History  . Marital status: Married    Spouse name: Not on file  . Number of children: Y  . Years of education: Not on file  . Highest education level: Not on file  Occupational History  . Occupation: traveling Database administrator  Social Needs  . Financial resource strain: Not on file  . Food insecurity    Worry: Not on file    Inability: Not on file  . Transportation needs    Medical: Not on file    Non-medical: Not on file  Tobacco Use  . Smoking status: Former Smoker    Packs/day: 2.00    Years: 28.00    Pack years: 56.00    Types: Cigarettes    Quit date: 12/06/1968    Years since quitting: 50.6  . Smokeless tobacco: Never Used  Substance and Sexual Activity  . Alcohol use: No    Alcohol/week: 0.0 standard drinks  . Drug use: No  . Sexual activity: Not on file  Lifestyle  . Physical activity    Days per week: Not on file    Minutes per session: Not on file  . Stress: Not on file  Relationships  . Social Herbalist on phone: Not on file    Gets together: Not on file    Attends religious service: Not on file    Active member of club or organization: Not on file    Attends meetings of clubs or organizations: Not on file    Relationship status: Not on file  Other Topics Concern  . Not on file  Social History Narrative  . Not on file     Family History:  The patient's family history includes Aneurysm in his mother; Cancer in his father and sister; Stomach cancer in his father.   ROS:   Please see the history of present illness.    ROS All other systems reviewed and are negative.    PHYSICAL EXAM:   VS:  BP (!) 173/61   Pulse  74   Ht 5' 8.5" (1.74 m)   Wt 160 lb (72.6 kg)   SpO2 99%   BMI 23.97 kg/m    GEN: Well nourished, well developed, in no acute distress  HEENT: normal  Neck: no JVD, carotid bruits, or masses Cardiac: RRR; no murmurs, rubs, or gallops,no edema  Respiratory:  clear to auscultation bilaterally, normal work of breathing GI: soft, nontender, nondistended, + BS MS: no deformity or atrophy  Skin: warm and dry, no rash Neuro:  Alert and Oriented x 3, Strength and sensation are intact Psych: euthymic mood, full affect  Wt Readings from Last 3 Encounters:  07/17/19 160 lb (72.6 kg)  06/18/19 156 lb (70.8 kg)  11/06/18 170 lb 9.6 oz (77.4 kg)      Studies/Labs Reviewed:   EKG:  EKG is ordered today.  The ekg ordered today demonstrates NSR without significant ST-T wave changes  Recent Labs: No results found for requested labs within last 8760 hours.   Lipid Panel    Component Value Date/Time   CHOL 157 07/05/2013 0915   TRIG 46.0 07/05/2013 0915   HDL 38.90 (L) 07/05/2013 0915   CHOLHDL 4 07/05/2013 0915   VLDL 9.2 07/05/2013 0915   LDLCALC 109 (H) 07/05/2013 0915    Additional studies/ records that were reviewed today include:   Normal Myoview on 12/12/2013   ASSESSMENT:    1. Essential hypertension   2. Coronary artery disease involving native coronary artery of native heart without angina pectoris   3. Hyperlipidemia LDL goal <70   4. Weakness of both lower extremities      PLAN:  In order of problems listed above:  1. Hypertension: Cardura was increased during the last office visit.  Initial blood pressure on arrival was in the 170s, however after he rested his blood pressure quickly improved to 150s.  Based on his home blood pressure diary, his blood pressure has been in the 140s at home.  I will continue on his current medication.  2. CAD: Denies any anginal symptoms.  Continue aspirin  3. Hyperlipidemia: Continue  red yeast rice.  4. Bilateral leg weakness: Worse on the left side.  I would not be too surprised if he has some degree of peripheral arterial disease.  He has no claudication symptoms with everyday activity, leg weakness only occurs with more strenuous activity.  I wish to hold off on ABI at this time unless symptom worsens.    Medication Adjustments/Labs and Tests Ordered: Current medicines are reviewed at length with the patient today.  Concerns regarding medicines are outlined above.  Medication changes, Labs and Tests ordered today are listed in the Patient Instructions below. Patient Instructions  Medication Instructions:  The current medical regimen is effective;  continue present plan and medications.  If you need a refill on your cardiac medications before your next appointment, please call your pharmacy.   Follow-Up: At [facility], you and your health needs are our priority.  As part of our continuing mission to provide you with exceptional heart care, we have created designated Provider Care Teams.  These Care Teams include your primary Cardiologist (physician) and Advanced Practice Providers (APPs -  Physician Assistants and Nurse Practitioners) who all work together to provide you with the care you need, when you need it. You will need a follow up appointment in 6 months.  Please call our office 2 months in advance to schedule this appointment.  You may see Peter Martinique, MD or one of  the following Advanced Practice Providers on your designated Care Team: [employer], [place] . Fabian Sharp, PA-C        Signed, [place], [place]  07/18/2019 11:24 PM    [facility] [address], [place], [place]  [address] Phone: [phone]; Fax: [phone]

## 2019-07-17 NOTE — Patient Instructions (Signed)
Medication Instructions:  The current medical regimen is effective;  continue present plan and medications.  If you need a refill on your cardiac medications before your next appointment, please call your pharmacy.   Follow-Up: At [facility], you and your health needs are our priority.  As part of our continuing mission to provide you with exceptional heart care, we have created designated Provider Care Teams.  These Care Teams include your primary Cardiologist (physician) and Advanced Practice Providers (APPs -  Physician Assistants and Nurse Practitioners) who all work together to provide you with the care you need, when you need it. You will need a follow up appointment in 6 months.  Please call our office 2 months in advance to schedule this appointment.  You may see Peter Martinique, MD or one of the following Advanced Practice Providers on your designated Care Team: [place], [place] . Fabian Sharp, PA-C

## 2019-07-18 ENCOUNTER — Encounter: Payer: Self-pay | Admitting: Physician Assistant

## 2019-07-31 DIAGNOSIS — L308 Other specified dermatitis: Secondary | ICD-10-CM | POA: Diagnosis not present

## 2019-08-07 DIAGNOSIS — N183 Chronic kidney disease, stage 3 (moderate): Secondary | ICD-10-CM | POA: Diagnosis not present

## 2019-08-07 DIAGNOSIS — D500 Iron deficiency anemia secondary to blood loss (chronic): Secondary | ICD-10-CM | POA: Diagnosis not present

## 2019-08-07 DIAGNOSIS — I129 Hypertensive chronic kidney disease with stage 1 through stage 4 chronic kidney disease, or unspecified chronic kidney disease: Secondary | ICD-10-CM | POA: Diagnosis not present

## 2019-08-07 DIAGNOSIS — Z13220 Encounter for screening for lipoid disorders: Secondary | ICD-10-CM | POA: Diagnosis not present

## 2019-08-07 DIAGNOSIS — R739 Hyperglycemia, unspecified: Secondary | ICD-10-CM | POA: Diagnosis not present

## 2019-08-07 DIAGNOSIS — Z0000 Encounter for general adult medical examination without abnormal findings: Secondary | ICD-10-CM | POA: Diagnosis not present

## 2019-08-07 DIAGNOSIS — I2510 Atherosclerotic heart disease of native coronary artery without angina pectoris: Secondary | ICD-10-CM | POA: Diagnosis not present

## 2019-08-07 DIAGNOSIS — Z1389 Encounter for screening for other disorder: Secondary | ICD-10-CM | POA: Diagnosis not present

## 2019-08-07 DIAGNOSIS — K219 Gastro-esophageal reflux disease without esophagitis: Secondary | ICD-10-CM | POA: Diagnosis not present

## 2019-08-22 DIAGNOSIS — D72829 Elevated white blood cell count, unspecified: Secondary | ICD-10-CM | POA: Diagnosis not present

## 2019-08-22 DIAGNOSIS — N183 Chronic kidney disease, stage 3 (moderate): Secondary | ICD-10-CM | POA: Diagnosis not present

## 2019-08-23 ENCOUNTER — Other Ambulatory Visit: Payer: Self-pay | Admitting: Cardiology

## 2019-08-28 ENCOUNTER — Ambulatory Visit (HOSPITAL_COMMUNITY): Payer: Medicare Other

## 2019-09-12 DIAGNOSIS — C44629 Squamous cell carcinoma of skin of left upper limb, including shoulder: Secondary | ICD-10-CM | POA: Diagnosis not present

## 2019-09-20 DIAGNOSIS — N183 Chronic kidney disease, stage 3 unspecified: Secondary | ICD-10-CM | POA: Diagnosis not present

## 2019-09-20 DIAGNOSIS — D500 Iron deficiency anemia secondary to blood loss (chronic): Secondary | ICD-10-CM | POA: Diagnosis not present

## 2019-10-15 DIAGNOSIS — D649 Anemia, unspecified: Secondary | ICD-10-CM | POA: Diagnosis not present

## 2019-10-16 DIAGNOSIS — D500 Iron deficiency anemia secondary to blood loss (chronic): Secondary | ICD-10-CM | POA: Diagnosis not present

## 2019-10-26 ENCOUNTER — Telehealth: Payer: Self-pay | Admitting: Hematology and Oncology

## 2019-10-26 NOTE — Telephone Encounter (Signed)
Patrick Rodriguez has been cld and scheduled to see Dr. Lorenso Courier on 12/2 at 1pm He's ben made aware to arrive 15 minutes early.

## 2019-10-29 DIAGNOSIS — L570 Actinic keratosis: Secondary | ICD-10-CM | POA: Diagnosis not present

## 2019-10-29 DIAGNOSIS — L820 Inflamed seborrheic keratosis: Secondary | ICD-10-CM | POA: Diagnosis not present

## 2019-10-29 DIAGNOSIS — L308 Other specified dermatitis: Secondary | ICD-10-CM | POA: Diagnosis not present

## 2019-10-29 DIAGNOSIS — D692 Other nonthrombocytopenic purpura: Secondary | ICD-10-CM | POA: Diagnosis not present

## 2019-10-29 DIAGNOSIS — R21 Rash and other nonspecific skin eruption: Secondary | ICD-10-CM | POA: Diagnosis not present

## 2019-10-29 DIAGNOSIS — L853 Xerosis cutis: Secondary | ICD-10-CM | POA: Diagnosis not present

## 2019-11-06 NOTE — Progress Notes (Addendum)
[place] [phone]   [phone] [place] NOTE  Patient Care Team: Maury Dus, MD as PCP - General (Family Medicine) Martinique, Peter M, MD as PCP - Cardiology (Cardiology)  Hematological/Oncological History # Normocytic Anemia 1) 07/26/2018: WBC 5.1, Hgb 11.6, MCV 94.4, Plt 363 2) 9//2020: WBC 25.1, Hgb 9.2, Plt 1083, MCV 93.2 (in context of eczema flare) 3) 09/20/2019: Iron 128, TIBC 242 (low), Iron sat 53%, transferrin 173 4) 10/15/2019: WBC 14.3, Hgb 8.8, Plt 485, MCV 90.6. FOB negative 5) Establish care with Dr. Lorenso Courier   CHIEF COMPLAINTS/PURPOSE OF CONSULTATION:  Anemia  HISTORY OF PRESENTING ILLNESS:  Patrick Rodriguez. 85 y.o. male with medical history significant for CAD s/p stent in 2000 and 2015, HTN, and GERD who presents for evaluation of anemia.   On review of prior records Patrick Rodriguez was noted to have a hemoglobin of 11.6 on 07/26/2018.  At that time his white blood cell count was 5.1 and his platelet count was 363.  His labs were repeated again in 1 year on 08/07/2019.  At that time his white blood cell count was found to be 25.1 hemoglobin 9.2 and a platelet count of 1083.  This was in the setting of an eczema flare, for which the patient was on steroid therapy.  Over the next 2 months his hemoglobin remained low leveling out at 8.8.  His platelet count steadily declined into the 400s and his white count remains elevated at 14.3 last checked on 10/15/2019.  He was recommended to start iron therapy and was referred to hematology for further evaluation management.  On exam today Patrick Rodriguez notes that his energy has been low.  He reports that he does have some shortness of breath after trying to walk up the cellar steps.  He is still able to walk several miles and uses his Bowflex exercise.  He notes that these do not bother him as much.  He denies any overt signs of bleeding with no nosebleeds or dark stools.  He notes that he does have easy  bruising but that this has been a problem in his old age.  He does eat a well-balanced diet inclusive of red meat chicken fish as well as vegetables.  He is currently undergoing an eczema flare covering his arms and chest.  He has been managed during this time with oral steroids as well as a triamcinolone cream.  He denies any fevers, chills, sweats, nausea, vomiting, diarrhea.  His list of medications includes nutritional supplements such as zinc, vitamin B complex, and now iron.  Full 10 point ROS is otherwise negative listed below.  MEDICAL HISTORY:  Past Medical History:  Diagnosis Date   Allergic rhinitis    Carotid artery stenosis    12/2011:  50-69% on the left and 1-49% on the right;  Carotid US (11/2013): Bilateral 40-59%; repeat 1 year   Cataract    Coronary artery disease    a.  s/p stenting of the LAD in 2000;  b. Lexiscan Myoview (12/2013): No ischemia, EF 70%; normal study   ED (erectile dysfunction)    GERD (gastroesophageal reflux disease)    Hyperlipidemia    Hyperplastic colon polyp 11/15/2013   x3   Hypertension    Myocardial infarction [facility])    Normal echocardiogram Jan 2013   EF of 55% and no wall motion abnormalities   Normal nuclear stress test 2008   Obesity    Palpitations    Holter (11/2013):  NSR, sinus brady, PVCs, rare blocked PAC, no sig arrhythmia   Substance abuse ([place])    Syncope Jan 2013   felt to be vasovagal; had normal echo, carotids ok, 1st degree AV block with mention (but no tracing) of 2nd degree type I AV block while in [place]   Tubular adenoma 11/15/2013   x2    SURGICAL HISTORY: Past Surgical History:  Procedure Laterality Date   APPENDECTOMY     CARDIAC CATHETERIZATION  01/20/1999   EF 60%   CARDIOVASCULAR STRESS TEST  04/19/2007   EF 74%   CORONARY STENT PLACEMENT  2000   LAD   DOPPLER ECHOCARDIOGRAPHY  02/06/1998   EF 60%   TONSILLECTOMY      SOCIAL HISTORY: Social History   Socioeconomic History   Marital  status: Married    Spouse name: Not on file   Number of children: Y   Years of education: Not on file   Highest education level: Not on file  Occupational History   Occupation: traveling Company secretary strain: Not on file   Food insecurity    Worry: Not on file    Inability: Not on file   Transportation needs    Medical: Not on file    Non-medical: Not on file  Tobacco Use   Smoking status: Former Smoker    Packs/day: 2.00    Years: 28.00    Pack years: 56.00    Types: Cigarettes    Quit date: 12/06/1968    Years since quitting: 50.9   Smokeless tobacco: Never Used  Substance and Sexual Activity   Alcohol use: No    Alcohol/week: 0.0 standard drinks   Drug use: No   Sexual activity: Not on file  Lifestyle   Physical activity    Days per week: Not on file    Minutes per session: Not on file   Stress: Not on file  Relationships   Social connections    Talks on phone: Not on file    Gets together: Not on file    Attends religious service: Not on file    Active member of club or organization: Not on file    Attends meetings of clubs or organizations: Not on file    Relationship status: Not on file   Intimate partner violence    Fear of current or ex partner: Not on file    Emotionally abused: Not on file    Physically abused: Not on file    Forced sexual activity: Not on file  Other Topics Concern   Not on file  Social History Narrative   Not on file    FAMILY HISTORY: Family History  Problem Relation Age of Onset   Aneurysm Mother    Stomach cancer Father    Cancer Father    Cancer Sister     ALLERGIES:  is allergic to hctz [hydrochlorothiazide].  MEDICATIONS:  Current Outpatient Medications  Medication Sig Dispense Refill   Coenzyme Q10 (CO Q-10) 100 MG CAPS Take 1 capsule by mouth every morning.     Nutritional Supplements (SALMON OIL) CAPS Take 1 capsule by mouth every morning.     amLODipine  (NORVASC) 10 MG tablet TAKE 1 TABLET BY MOUTH DAILY 90 tablet 3   Ascorbic Acid (VITAMIN C) 1000 MG tablet Take 1,000 mg by mouth daily.       aspirin 81 MG tablet Take 81 mg by mouth daily.     b complex vitamins  tablet Take 1 tablet by mouth daily.       doxazosin (CARDURA) 4 MG tablet Take 1 tablet (4 mg total) by mouth daily. 90 tablet 1   magnesium gluconate (MAGONATE) 500 MG tablet Take 500 mg by mouth daily.       Nattokinase 100 MG CAPS Take by mouth daily.       omeprazole (PRILOSEC) 40 MG capsule Take 40 mg by mouth daily.     Red Yeast Rice Extract (RED YEAST RICE PO) Take 1 tablet by mouth 3 (three) times a week.     vitamin E 400 UNIT capsule Take 400 Units by mouth daily.       zinc gluconate 50 MG tablet Take 50 mg by mouth daily.       No current facility-administered medications for this visit.     REVIEW OF SYSTEMS:   Constitutional: ( - ) fevers, ( - )  chills , ( - ) night sweats Eyes: ( - ) blurriness of vision, ( - ) double vision, ( - ) watery eyes Ears, nose, mouth, throat, and face: ( - ) mucositis, ( - ) sore throat Respiratory: ( - ) cough, ( + ) dyspnea on exertion, ( - ) wheezes Cardiovascular: ( - ) palpitation, ( - ) chest discomfort, ( - ) lower extremity swelling Gastrointestinal:  ( - ) nausea, ( - ) heartburn, ( - ) change in bowel habits Skin: ( +) abnormal skin rashes Lymphatics: ( - ) new lymphadenopathy, ( + ) easy bruising Neurological: ( - ) numbness, ( - ) tingling, ( - ) new weaknesses Behavioral/Psych: ( - ) mood change, ( - ) new changes  All other systems were reviewed with the patient and are negative.  PHYSICAL EXAMINATION: ECOG PERFORMANCE STATUS: 1 - Symptomatic but completely ambulatory  Vitals:   11/07/19 1250  BP: (!) 164/51  Pulse: 71  Resp: 18  Temp: 98.7 F (37.1 C)  SpO2: 100%   Filed Weights   11/07/19 1250  Weight: 156 lb (70.8 kg)    GENERAL: well appearing elderly Caucasian male in NAD  SKIN: skin  color, texture, turgor are normal, no rashes or significant lesions EYES: conjunctiva are pink and non-injected, sclera clear LUNGS: clear to auscultation and percussion with normal breathing effort HEART: regular rate & rhythm and no murmurs and no lower extremity edema ABDOMEN: soft, non-tender, non-distended, normal bowel sounds. No HSM.  Musculoskeletal: no cyanosis of digits and no clubbing  PSYCH: alert & oriented x 3, fluent speech NEURO: no focal motor/sensory deficits  LABORATORY DATA:  I have reviewed the data as listed Lab Results  Component Value Date   WBC 13.9 (H) 12/18/2015   HGB 13.5 12/18/2015   HCT 40.4 12/18/2015   MCV 92.5 12/18/2015   PLT 491.0 (H) 12/18/2015   NEUTROABS 9.3 (H) 12/18/2015    PATHOLOGY: None relevant to review.  BLOOD FILM:   Review of the peripheral blood smear showed normal appearing white cells with neutrophils that were appropriately lobated and granulated. There was no predominance of bi-lobed or hyper-segmented neutrophils appreciated. No Dohle bodies were noted. There was some left shifting, but no immature forms or blasts noted *(on repeat viewing after submission of note, a blast cell was found on tech review. A few other myelocytes/metamyelocytes were also located. Sent for pathology review). Lymphocytes remain normal in size without any predominance of large granular lymphocytes. Red cells show no anisopoikilocytosis, macrocytes , microcytes or polychromasia. There were no  schistocytes, target cells, echinocytes, acanthocytes, dacrocytes, or stomatocytes.There was no rouleaux formation, nucleated red cells, or intra-cellular inclusions noted. The platelets are normal in size, shape, and color without any clumping evident. Platelets are markedly increased in number with rare large/giant platelet.   RADIOGRAPHIC STUDIES: None relevant to review.  ASSESSMENT & PLAN Patrick Rodriguez. 85 y.o. male with medical history significant for CAD s/p  stent in 2000 and 2015, HTN, and GERD who presents for evaluation of anemia.  His anemia is normocytic and developed relatively recently.  He has near normal labs dating back to August 2019 however 1 year later in September 2020 he had a decline in his hemoglobin to 9.2. On recheck over the last 2 months it has leveled off at 8.8.  During this time he has had other hematological abnormalities such as a pronounced leukocytosis as well as thrombocytosis, however these are in the setting of an eczema flare treated with steroid therapy.  He was told to start p.o. iron, however his iron studies show an iron sat of 53% as well as a decreased total iron binding capacity.  These findings are not entirely consistent with an iron deficiency anemia.  Today for Patrick Rodriguez we will attempt a broad work-up including nutritional studies, bone marrow production capacity, hemolysis studies, as well as a malignancy rule out.  He had negative fecal occult blood cards which effectively rule out a GI cause, additionally he had a colonoscopy in 2014 which had no major concerning abnormalities.  In the event that our work-up below does not reveal an etiology we will need to consider a bone marrow biopsy.  Also in the context of his eczema flare it is difficult to determine if there are any white blood cell or light abnormalities.  We will have him return in 6 weeks time, and consider bone marrow biopsy if no etiology is clear by that time.  Of note, he is on a B vitamin complex which often contains sub-therapeutic levels of Vitamin b12 (137mg or so). He is taking zinc tablets, which can decrease copper levels.   #Normocytic Anemia --full nutritional evaluation to include iron panel, Vitamin B12, folate, MMA, homocysteine, and copper --CMP and CBC with peripheral blood film --will assess for hemolysis and RBC productivity with reticulocytes, LDH, haptoglobin, and DAT as well as TSH and erythropoetin.  --MM screening with SPEP and  SFLC (lower suspicion due to low normal protein in serum and no protein detected in the urine). --assess inflammatory status with ESR, CRP (likely elevated in setting of eczema).  -- no clear indication for GI evaluation as iron level is normal, FOB cards on 10/16/19 were negative. Last colonoscopy in 2014 with polyps and diverticula --may need to consider bone marrow biopsy if none of the above testing is revealing for an etiology. --RTC in 6 weeks time to reassess or sooner if an abnormality is noted.   Orders Placed This Encounter  Procedures   [place]    Standing Status:   Future    Number of Occurrences:   1    Standing Expiration Date:   11/06/2020   Retic Panel    Standing Status:   Future    Number of Occurrences:   1    Standing Expiration Date:   11/06/2020   Save Smear ([facility])    Standing Status:   Future    Number of Occurrences:   1    Standing Expiration Date:  11/06/2020   Sedimentation rate    Standing Status:   Future    Number of Occurrences:   1    Standing Expiration Date:   11/06/2020   CMP ([place] only)    Standing Status:   Future    Number of Occurrences:   1    Standing Expiration Date:   11/06/2020   Lactate dehydrogenase (LDH)    Standing Status:   Future    Number of Occurrences:   1    Standing Expiration Date:   11/06/2020   TSH    Standing Status:   Future    Number of Occurrences:   1    Standing Expiration Date:   11/06/2020   Iron and TIBC    Standing Status:   Future    Number of Occurrences:   1    Standing Expiration Date:   11/06/2020   Ferritin    Standing Status:   Future    Number of Occurrences:   1    Standing Expiration Date:   11/06/2020   Vitamin B12    Standing Status:   Future    Number of Occurrences:   1    Standing Expiration Date:   11/06/2020   Folate, Serum    Standing Status:   Future    Number of Occurrences:   1    Standing Expiration Date:   11/06/2020   Haptoglobin     Standing Status:   Future    Number of Occurrences:   1    Standing Expiration Date:   11/06/2020   Methylmalonic acid, serum    Standing Status:   Future    Number of Occurrences:   1    Standing Expiration Date:   11/06/2020   Erythropoietin    Standing Status:   Future    Number of Occurrences:   1    Standing Expiration Date:   11/06/2020   SPEP with reflex to IFE    Standing Status:   Future    Number of Occurrences:   1    Standing Expiration Date:   11/06/2020   Kappa/lambda light chains    Standing Status:   Future    Number of Occurrences:   1    Standing Expiration Date:   11/06/2020   Homocysteine, serum    Standing Status:   Future    Number of Occurrences:   1    Standing Expiration Date:   11/06/2020   Copper, serum    Standing Status:   Future    Number of Occurrences:   1    Standing Expiration Date:   11/06/2020   C-reactive protein    Standing Status:   Future    Number of Occurrences:   1    Standing Expiration Date:   11/06/2020   Direct antiglobulin test ([facility])    Standing Status:   Future    Number of Occurrences:   1    Standing Expiration Date:   11/06/2020    All questions were answered. The patient knows to call the clinic with any problems, questions or concerns.  A total of more than 60 minutes were spent on this encounter and over half of that time was spent on counseling and coordination of care as outlined above.   Ledell Peoples, MD Department of Hematology/Oncology [place] at [facility] Phone: [phone] Pager: [phone] Email: [email or web address]  11/07/2019 2:08 PM

## 2019-11-07 ENCOUNTER — Inpatient Hospital Stay: Payer: Medicare Other | Attending: Hematology and Oncology | Admitting: Hematology and Oncology

## 2019-11-07 ENCOUNTER — Inpatient Hospital Stay: Payer: Medicare Other

## 2019-11-07 ENCOUNTER — Other Ambulatory Visit: Payer: Self-pay

## 2019-11-07 VITALS — BP 164/51 | HR 71 | Temp 98.7°F | Resp 18 | Ht 68.5 in | Wt 156.0 lb

## 2019-11-07 DIAGNOSIS — L309 Dermatitis, unspecified: Secondary | ICD-10-CM | POA: Diagnosis not present

## 2019-11-07 DIAGNOSIS — D649 Anemia, unspecified: Secondary | ICD-10-CM

## 2019-11-07 DIAGNOSIS — K219 Gastro-esophageal reflux disease without esophagitis: Secondary | ICD-10-CM | POA: Insufficient documentation

## 2019-11-07 DIAGNOSIS — L308 Other specified dermatitis: Secondary | ICD-10-CM | POA: Diagnosis not present

## 2019-11-07 DIAGNOSIS — R0602 Shortness of breath: Secondary | ICD-10-CM | POA: Insufficient documentation

## 2019-11-07 DIAGNOSIS — I2510 Atherosclerotic heart disease of native coronary artery without angina pectoris: Secondary | ICD-10-CM | POA: Insufficient documentation

## 2019-11-07 DIAGNOSIS — L570 Actinic keratosis: Secondary | ICD-10-CM | POA: Diagnosis not present

## 2019-11-07 DIAGNOSIS — D72829 Elevated white blood cell count, unspecified: Secondary | ICD-10-CM | POA: Diagnosis not present

## 2019-11-07 DIAGNOSIS — I10 Essential (primary) hypertension: Secondary | ICD-10-CM | POA: Diagnosis not present

## 2019-11-07 DIAGNOSIS — D464 Refractory anemia, unspecified: Secondary | ICD-10-CM | POA: Diagnosis not present

## 2019-11-07 LAB — RETIC PANEL
Immature Retic Fract: 15.4 % (ref 2.3–15.9)
RBC.: 3.05 MIL/uL — ABNORMAL LOW (ref 4.22–5.81)
Retic Count, Absolute: 53.4 10*3/uL (ref 19.0–186.0)
Retic Ct Pct: 1.8 % (ref 0.4–3.1)
Reticulocyte Hemoglobin: 34.8 pg (ref >27.9)

## 2019-11-07 LAB — CBC WITH DIFFERENTIAL (CANCER CENTER ONLY)
Abs Immature Granulocytes: 0.51 10*3/uL — ABNORMAL HIGH (ref 0.00–0.07)
Basophils Absolute: 0.1 10*3/uL (ref 0.0–0.1)
Basophils Relative: 0 %
Eosinophils Absolute: 1.1 10*3/uL — ABNORMAL HIGH (ref 0.0–0.5)
Eosinophils Relative: 6 %
HCT: 28.1 % — ABNORMAL LOW (ref 39.0–52.0)
Hemoglobin: 9.3 g/dL — ABNORMAL LOW (ref 13.0–17.0)
Immature Granulocytes: 3 %
Lymphocytes Relative: 5 %
Lymphs Abs: 1.1 10*3/uL (ref 0.7–4.0)
MCH: 30.4 pg (ref 26.0–34.0)
MCHC: 33.1 g/dL (ref 30.0–36.0)
MCV: 91.8 fL (ref 80.0–100.0)
Monocytes Absolute: 0.2 10*3/uL (ref 0.1–1.0)
Monocytes Relative: 1 %
Neutro Abs: 17.1 10*3/uL — ABNORMAL HIGH (ref 1.7–7.7)
Neutrophils Relative %: 85 %
Platelet Count: 696 10*3/uL — ABNORMAL HIGH (ref 150–400)
RBC: 3.06 MIL/uL — ABNORMAL LOW (ref 4.22–5.81)
RDW: 21.6 % — ABNORMAL HIGH (ref 11.5–15.5)
WBC Count: 20 10*3/uL — ABNORMAL HIGH (ref 4.0–10.5)
nRBC: 0 % (ref 0.0–0.2)

## 2019-11-07 LAB — CMP (CANCER CENTER ONLY)
ALT: 19 U/L (ref 0–44)
AST: 22 U/L (ref 15–41)
Albumin: 4.2 g/dL (ref 3.5–5.0)
Alkaline Phosphatase: 55 U/L (ref 38–126)
Anion gap: 11 (ref 5–15)
BUN: 25 mg/dL — ABNORMAL HIGH (ref 8–23)
CO2: 24 mmol/L (ref 22–32)
Calcium: 9.3 mg/dL (ref 8.9–10.3)
Chloride: 106 mmol/L (ref 98–111)
Creatinine: 1.47 mg/dL — ABNORMAL HIGH (ref 0.61–1.24)
GFR, Est AFR Am: 47 mL/min — ABNORMAL LOW (ref >60)
GFR, Estimated: 41 mL/min — ABNORMAL LOW (ref >60)
Glucose, Bld: 99 mg/dL (ref 70–99)
Potassium: 4.2 mmol/L (ref 3.5–5.1)
Sodium: 141 mmol/L (ref 135–145)
Total Bilirubin: 0.3 mg/dL (ref 0.3–1.2)
Total Protein: 6.9 g/dL (ref 6.5–8.1)

## 2019-11-07 LAB — FERRITIN: Ferritin: 131 ng/mL (ref 24–336)

## 2019-11-07 LAB — DIRECT ANTIGLOBULIN TEST (NOT AT ARMC)
DAT, IgG: NEGATIVE
DAT, complement: NEGATIVE

## 2019-11-07 LAB — SAVE SMEAR(SSMR), FOR PROVIDER SLIDE REVIEW

## 2019-11-07 LAB — C-REACTIVE PROTEIN: CRP: 0.6 mg/dL (ref <1.0)

## 2019-11-07 LAB — IRON AND TIBC
Iron: 73 ug/dL (ref 42–163)
Saturation Ratios: 30 % (ref 20–55)
TIBC: 243 ug/dL (ref 202–409)
UIBC: 170 ug/dL (ref 117–376)

## 2019-11-07 LAB — TSH: TSH: 2.401 u[IU]/mL (ref 0.320–4.118)

## 2019-11-07 LAB — LACTATE DEHYDROGENASE: LDH: 284 U/L — ABNORMAL HIGH (ref 98–192)

## 2019-11-07 LAB — FOLATE: Folate: 21.1 ng/mL (ref >5.9)

## 2019-11-07 LAB — VITAMIN B12: Vitamin B-12: 2846 pg/mL — ABNORMAL HIGH (ref 180–914)

## 2019-11-07 LAB — SEDIMENTATION RATE: Sed Rate: 20 mm/hr — ABNORMAL HIGH (ref 0–16)

## 2019-11-08 LAB — PROTEIN ELECTROPHORESIS, SERUM, WITH REFLEX
A/G Ratio: 1.6 (ref 0.7–1.7)
Albumin ELP: 4.1 g/dL (ref 2.9–4.4)
Alpha-1-Globulin: 0.3 g/dL (ref 0.0–0.4)
Alpha-2-Globulin: 0.7 g/dL (ref 0.4–1.0)
Beta Globulin: 0.8 g/dL (ref 0.7–1.3)
Gamma Globulin: 0.7 g/dL (ref 0.4–1.8)
Globulin, Total: 2.5 g/dL (ref 2.2–3.9)
Total Protein ELP: 6.6 g/dL (ref 6.0–8.5)

## 2019-11-08 LAB — KAPPA/LAMBDA LIGHT CHAINS
Kappa free light chain: 40.8 mg/L — ABNORMAL HIGH (ref 3.3–19.4)
Kappa, lambda light chain ratio: 1.54 (ref 0.26–1.65)
Lambda free light chains: 26.5 mg/L — ABNORMAL HIGH (ref 5.7–26.3)

## 2019-11-08 LAB — ERYTHROPOIETIN: Erythropoietin: 43.4 m[IU]/mL — ABNORMAL HIGH (ref 2.6–18.5)

## 2019-11-08 LAB — HAPTOGLOBIN: Haptoglobin: 105 mg/dL (ref 38–329)

## 2019-11-08 LAB — HOMOCYSTEINE: Homocysteine: 12.6 umol/L (ref 0.0–21.3)

## 2019-11-08 LAB — PATHOLOGIST SMEAR REVIEW

## 2019-11-09 DIAGNOSIS — L309 Dermatitis, unspecified: Secondary | ICD-10-CM | POA: Diagnosis not present

## 2019-11-09 LAB — COPPER, SERUM: Copper: 106 ug/dL (ref 72–166)

## 2019-11-10 LAB — METHYLMALONIC ACID, SERUM: Methylmalonic Acid, Quantitative: 232 nmol/L (ref 0–378)

## 2019-11-13 ENCOUNTER — Telehealth: Payer: Self-pay | Admitting: Hematology and Oncology

## 2019-11-13 NOTE — Telephone Encounter (Signed)
Called Mr. Frazer to discuss the lab results from our visit last week. Patient has anemia with Hgb 9.3, but no clear nutritional deficiencies. EPO is elevated and retic count is down. Peripheral blood film was concerning for precusor WBC and markedly elevated WBC/plts. Concern for MPN vs myelofibrosis. Recommend bone marrow biopsy. Also needs BCR/ABL.   Will schedule patient for bone marrow biopsy. The plan was discussed with the patient who voiced understanding.   Ledell Peoples, MD Department of Hematology/Oncology [place] at [facility] Phone: [phone] Pager: [phone] Email: [email or web address]

## 2019-11-14 DIAGNOSIS — L309 Dermatitis, unspecified: Secondary | ICD-10-CM | POA: Diagnosis not present

## 2019-11-14 DIAGNOSIS — D649 Anemia, unspecified: Secondary | ICD-10-CM | POA: Diagnosis not present

## 2019-11-14 DIAGNOSIS — L258 Unspecified contact dermatitis due to other agents: Secondary | ICD-10-CM | POA: Diagnosis not present

## 2019-11-14 DIAGNOSIS — L570 Actinic keratosis: Secondary | ICD-10-CM | POA: Diagnosis not present

## 2019-11-23 ENCOUNTER — Telehealth: Payer: Self-pay | Admitting: Hematology and Oncology

## 2019-11-26 ENCOUNTER — Other Ambulatory Visit: Payer: Self-pay | Admitting: Adult Health

## 2019-11-26 ENCOUNTER — Other Ambulatory Visit: Payer: Self-pay

## 2019-11-26 ENCOUNTER — Encounter: Payer: Self-pay | Admitting: Adult Health

## 2019-11-26 ENCOUNTER — Telehealth: Payer: Self-pay | Admitting: Adult Health

## 2019-11-26 ENCOUNTER — Telehealth: Payer: Self-pay | Admitting: *Deleted

## 2019-11-26 ENCOUNTER — Inpatient Hospital Stay (HOSPITAL_BASED_OUTPATIENT_CLINIC_OR_DEPARTMENT_OTHER): Payer: Medicare Other | Admitting: Adult Health

## 2019-11-26 ENCOUNTER — Inpatient Hospital Stay: Payer: Medicare Other

## 2019-11-26 VITALS — BP 161/56 | HR 81 | Temp 98.7°F | Resp 16

## 2019-11-26 DIAGNOSIS — K219 Gastro-esophageal reflux disease without esophagitis: Secondary | ICD-10-CM | POA: Diagnosis not present

## 2019-11-26 DIAGNOSIS — D464 Refractory anemia, unspecified: Secondary | ICD-10-CM

## 2019-11-26 DIAGNOSIS — I10 Essential (primary) hypertension: Secondary | ICD-10-CM | POA: Diagnosis not present

## 2019-11-26 DIAGNOSIS — L309 Dermatitis, unspecified: Secondary | ICD-10-CM | POA: Diagnosis not present

## 2019-11-26 DIAGNOSIS — D72829 Elevated white blood cell count, unspecified: Secondary | ICD-10-CM | POA: Diagnosis not present

## 2019-11-26 DIAGNOSIS — R0602 Shortness of breath: Secondary | ICD-10-CM | POA: Diagnosis not present

## 2019-11-26 DIAGNOSIS — I2510 Atherosclerotic heart disease of native coronary artery without angina pectoris: Secondary | ICD-10-CM | POA: Diagnosis not present

## 2019-11-26 LAB — CBC WITH DIFFERENTIAL (CANCER CENTER ONLY)
Abs Immature Granulocytes: 1.45 10*3/uL — ABNORMAL HIGH (ref 0.00–0.07)
Basophils Absolute: 0.1 10*3/uL (ref 0.0–0.1)
Basophils Relative: 0 %
Eosinophils Absolute: 1.6 10*3/uL — ABNORMAL HIGH (ref 0.0–0.5)
Eosinophils Relative: 6 %
HCT: 25.6 % — ABNORMAL LOW (ref 39.0–52.0)
Hemoglobin: 8.3 g/dL — ABNORMAL LOW (ref 13.0–17.0)
Immature Granulocytes: 5 %
Lymphocytes Relative: 4 %
Lymphs Abs: 1.1 10*3/uL (ref 0.7–4.0)
MCH: 30.4 pg (ref 26.0–34.0)
MCHC: 32.4 g/dL (ref 30.0–36.0)
MCV: 93.8 fL (ref 80.0–100.0)
Monocytes Absolute: 0.3 10*3/uL (ref 0.1–1.0)
Monocytes Relative: 1 %
Neutro Abs: 24.7 10*3/uL — ABNORMAL HIGH (ref 1.7–7.7)
Neutrophils Relative %: 84 %
Platelet Count: 1008 10*3/uL (ref 150–400)
RBC: 2.73 MIL/uL — ABNORMAL LOW (ref 4.22–5.81)
RDW: 22.1 % — ABNORMAL HIGH (ref 11.5–15.5)
WBC Count: 29.3 10*3/uL — ABNORMAL HIGH (ref 4.0–10.5)
nRBC: 0.1 % (ref 0.0–0.2)

## 2019-11-26 MED ORDER — LIDOCAINE HCL 2 % IJ SOLN
INTRAMUSCULAR | Status: AC
  Filled 2019-11-26: qty 20

## 2019-11-26 NOTE — Telephone Encounter (Signed)
Med Tech called with critical value PLT 1,008. Provider Wilber Bihari NP,  made aware.

## 2019-11-26 NOTE — Patient Instructions (Signed)

## 2019-11-26 NOTE — Progress Notes (Signed)
INDICATION: refractory anemia  Brief examination was performed. ENT: adequate airway clearance Heart: regular rate and rhythm.No Murmurs Lungs: clear to auscultation, no wheezes, normal respiratory effort  Bone Marrow Biopsy and Aspiration Procedure Note   Informed consent was obtained and potential risks including bleeding, infection and pain were reviewed with the patient.  The patient's name, date of birth, identification, consent and allergies were verified prior to the start of procedure and time out was performed.  The right posterior iliac crest was chosen as the site of biopsy.  The skin was prepped with ChloraPrep.   10 cc of 2% lidocaine was used to provide local anaesthesia.   10 cc of bone marrow aspirate was obtained followed by 1cm biopsy.  Pressure was applied to the biopsy site and bandage was placed over the biopsy site. Patient was made to lie on the back for 30 mins prior to discharge.  The procedure was tolerated well. COMPLICATIONS: None BLOOD LOSS: none The patient was discharged home in stable condition with a 1 week follow up to review results.  Patient was provided with post bone marrow biopsy instructions and instructed to call if there was any bleeding or worsening pain.  Specimens sent for flow cytometry, cytogenetics and additional studies.  Signed Scot Dock, NP

## 2019-11-26 NOTE — Telephone Encounter (Signed)
I talk with patient regarding schedule  

## 2019-11-26 NOTE — Progress Notes (Signed)
Pt observed 30 minutes post procedure.  VSS.  Bandage clean, dry, and intact.  Discharge instructions given.

## 2019-11-26 NOTE — Progress Notes (Signed)
Reviewed patient platelet count of 1008 with Dr. Lorenso Courier along with his WBC elevation, and hemoglobin of 8.3.  Discussed that patient tolerated bone marrow biopsy well.    Patrick Bihari, NP

## 2019-11-28 DIAGNOSIS — L258 Unspecified contact dermatitis due to other agents: Secondary | ICD-10-CM | POA: Diagnosis not present

## 2019-11-28 DIAGNOSIS — D649 Anemia, unspecified: Secondary | ICD-10-CM | POA: Diagnosis not present

## 2019-11-28 DIAGNOSIS — L209 Atopic dermatitis, unspecified: Secondary | ICD-10-CM | POA: Diagnosis not present

## 2019-12-03 LAB — SURGICAL PATHOLOGY

## 2019-12-05 ENCOUNTER — Encounter (HOSPITAL_COMMUNITY): Payer: Self-pay | Admitting: Specialist

## 2019-12-10 ENCOUNTER — Inpatient Hospital Stay: Payer: Medicare Other | Attending: Hematology and Oncology | Admitting: Hematology and Oncology

## 2019-12-10 ENCOUNTER — Encounter (HOSPITAL_COMMUNITY): Payer: Self-pay | Admitting: Hematology and Oncology

## 2019-12-10 ENCOUNTER — Other Ambulatory Visit: Payer: Medicare Other

## 2019-12-10 DIAGNOSIS — D649 Anemia, unspecified: Secondary | ICD-10-CM | POA: Diagnosis not present

## 2019-12-10 DIAGNOSIS — D464 Refractory anemia, unspecified: Secondary | ICD-10-CM | POA: Insufficient documentation

## 2019-12-10 LAB — SURGICAL PATHOLOGY

## 2019-12-11 ENCOUNTER — Telehealth: Payer: Self-pay | Admitting: Hematology and Oncology

## 2019-12-11 NOTE — Telephone Encounter (Signed)
Scheduled appt per 1/4 sch message - pt is aware of appt date and time

## 2019-12-11 NOTE — Progress Notes (Signed)
[place] [phone]   [phone]  PROGRESS NOTE--TELEPHONE VISIT  Patient Care Team: Maury Dus, MD as PCP - General (Family Medicine) Martinique, Peter M, MD as PCP - Cardiology (Cardiology)  Hematological/Oncological History  # Normocytic Anemia 1) 07/26/2018: WBC 5.1, Hgb 11.6, MCV 94.4, Plt 363 2) 9//2020: WBC 25.1, Hgb 9.2, Plt 1083, MCV 93.2 (in context of eczema flare) 3) 09/20/2019: Iron 128, TIBC 242 (low), Iron sat 53%, transferrin 173 4) 10/15/2019: WBC 14.3, Hgb 8.8, Plt 485, MCV 90.6. FOB negative 5) 11/07/2019: Establish care with Dr. Lorenso Courier  6) 11/26/2019: Bone marrow biopsy performed. Showed hypercellular marrow concern for MDS vs MPN. MDS panel negative for definitive mutation.    Interval History:  Patrick Rodriguez. 86 y.o. male with medical history significant for normocytic anemia presents for a follow up telephone visit.   On discussion today Mr. Corniel notes that he has recovered well from his bone marrow biopsy.  He notes that it was uncomfortable at the time, but he had had no subsequent bleeding or issues following the procedure.  He reports that he continues to have shortness of breath and marked fatigue.  He notes it is difficult to live this way and is hopeful that we find a solution to help fix his current symptoms.  Otherwise he reports he has had no fevers, chills, sweats, nausea or vomiting.  He reports he is at his otherwise baseline level of health with no major changes in the interim since his last visit in early December.  Full 10 point ROS is listed below.  MEDICAL HISTORY:  Past Medical History:  Diagnosis Date  . Allergic rhinitis   . Carotid artery stenosis    12/2011:  50-69% on the left and 1-49% on the right;  Carotid US (11/2013): Bilateral 40-59%; repeat 1 year  . Cataract   . Coronary artery disease    a.  s/p stenting of the LAD in 2000;  b. Lexiscan Myoview (12/2013): No ischemia, EF 70%; normal study    . ED (erectile dysfunction)   . GERD (gastroesophageal reflux disease)   . Hyperlipidemia   . Hyperplastic colon polyp 11/15/2013   x3  . Hypertension   . Myocardial infarction ([place])   . Normal echocardiogram Jan 2013   EF of 55% and no wall motion abnormalities  . Normal nuclear stress test 2008  . Obesity   . Palpitations    Holter (11/2013): NSR, sinus brady, PVCs, rare blocked PAC, no sig arrhythmia  . Substance abuse ([place])   . Syncope Jan 2013   felt to be vasovagal; had normal echo, carotids ok, 1st degree AV block with mention (but no tracing) of 2nd degree type I AV block while in [place]  . Tubular adenoma 11/15/2013   x2    SURGICAL HISTORY: Past Surgical History:  Procedure Laterality Date  . APPENDECTOMY    . CARDIAC CATHETERIZATION  01/20/1999   EF 60%  . CARDIOVASCULAR STRESS TEST  04/19/2007   EF 74%  . CORONARY STENT PLACEMENT  2000   LAD  . DOPPLER ECHOCARDIOGRAPHY  02/06/1998   EF 60%  . TONSILLECTOMY      SOCIAL HISTORY: Social History   Socioeconomic History  . Marital status: Married    Spouse name: Not on file  . Number of children: Y  . Years of education: Not on file  . Highest education level: Not on file  Occupational History  . Occupation: traveling paster  Tobacco Use  .  Smoking status: Former Smoker    Packs/day: 2.00    Years: 28.00    Pack years: 56.00    Types: Cigarettes    Quit date: 12/06/1968    Years since quitting: 51.0  . Smokeless tobacco: Never Used  Substance and Sexual Activity  . Alcohol use: No    Alcohol/week: 0.0 standard drinks  . Drug use: No  . Sexual activity: Not on file  Other Topics Concern  . Not on file  Social History Narrative  . Not on file   Social Determinants of Health   Financial Resource Strain:   . Difficulty of Paying Living Expenses: Not on file  Food Insecurity:   . Worried About Charity fundraiser in the Last Year: Not on file  . Ran Out of Food in the Last Year: Not on file   Transportation Needs:   . Lack of Transportation (Medical): Not on file  . Lack of Transportation (Non-Medical): Not on file  Physical Activity:   . Days of Exercise per Week: Not on file  . Minutes of Exercise per Session: Not on file  Stress:   . Feeling of Stress : Not on file  Social Connections:   . Frequency of Communication with Friends and Family: Not on file  . Frequency of Social Gatherings with Friends and Family: Not on file  . Attends Religious Services: Not on file  . Active Member of Clubs or Organizations: Not on file  . Attends Archivist Meetings: Not on file  . Marital Status: Not on file  Intimate Partner Violence:   . Fear of Current or Ex-Partner: Not on file  . Emotionally Abused: Not on file  . Physically Abused: Not on file  . Sexually Abused: Not on file    FAMILY HISTORY: Family History  Problem Relation Age of Onset  . Aneurysm Mother   . Stomach cancer Father   . Cancer Father   . Cancer Sister     ALLERGIES:  is allergic to hctz [hydrochlorothiazide].  MEDICATIONS:  Current Outpatient Medications  Medication Sig Dispense Refill  . amLODipine (NORVASC) 10 MG tablet TAKE 1 TABLET BY MOUTH DAILY 90 tablet 3  . Ascorbic Acid (VITAMIN C) 1000 MG tablet Take 1,000 mg by mouth daily.      Marland Kitchen aspirin 81 MG tablet Take 81 mg by mouth daily.    Marland Kitchen b complex vitamins tablet Take 1 tablet by mouth daily.      . Coenzyme Q10 (CO Q-10) 100 MG CAPS Take 1 capsule by mouth every morning.    Marland Kitchen doxazosin (CARDURA) 4 MG tablet Take 1 tablet (4 mg total) by mouth daily. 90 tablet 1  . magnesium gluconate (MAGONATE) 500 MG tablet Take 500 mg by mouth daily.      . Nattokinase 100 MG CAPS Take by mouth daily.      . Nutritional Supplements (SALMON OIL) CAPS Take 1 capsule by mouth every morning.    Marland Kitchen omeprazole (PRILOSEC) 40 MG capsule Take 40 mg by mouth daily.    . Red Yeast Rice Extract (RED YEAST RICE PO) Take 1 tablet by mouth 3 (three) times a  week.    . vitamin E 400 UNIT capsule Take 400 Units by mouth daily.      Marland Kitchen zinc gluconate 50 MG tablet Take 50 mg by mouth daily.       No current facility-administered medications for this visit.    REVIEW OF SYSTEMS:   Constitutional: ( - )  fevers, ( - )  chills , ( - ) night sweats Eyes: ( - ) blurriness of vision, ( - ) double vision, ( - ) watery eyes Ears, nose, mouth, throat, and face: ( - ) mucositis, ( - ) sore throat Respiratory: ( - ) cough, ( + ) dyspnea, ( - ) wheezes Cardiovascular: ( - ) palpitation, ( - ) chest discomfort, ( - ) lower extremity swelling Gastrointestinal:  ( - ) nausea, ( - ) heartburn, ( - ) change in bowel habits Skin: ( - ) abnormal skin rashes Lymphatics: ( - ) new lymphadenopathy, ( - ) easy bruising Neurological: ( - ) numbness, ( - ) tingling, ( - ) new weaknesses Behavioral/Psych: ( - ) mood change, ( - ) new changes  All other systems were reviewed with the patient and are negative.  PHYSICAL EXAMINATION: Not performed as this was a telephone visit.   LABORATORY DATA:  I have reviewed the data as listed Lab Results  Component Value Date   WBC 29.3 (H) 11/26/2019   HGB 8.3 (L) 11/26/2019   HCT 25.6 (L) 11/26/2019   MCV 93.8 11/26/2019   PLT 1,008 ([place]) 11/26/2019   NEUTROABS 24.7 (H) 11/26/2019    ASSESSMENT & PLAN [name] 86 y.o. male with medical history significant for CAD s/p stent in 2000 and 2015, HTN, and GERD who presents for evaluation of anemia.  His anemia is normocytic and developed relatively recently.  He has near normal labs dating back to August 2019 however 1 year later in September 2020 he had a decline in his hemoglobin to 9.2. On recheck over the last 2 months it has leveled off at 8.8.  During this time he has had other hematological abnormalities such as a pronounced leukocytosis as well as thrombocytosis, however these are in the setting of an eczema flare treated with steroid therapy.    Bone marrow biopsy  performed on 11/26/2019 showed a hypercellular marrow consistent with MDS versus MPN.  MDS panel was negative for definitive mutation at that time.  After discussion with Mr. Adelson he notes that he continues to have fatigue and shortness of breath.    We discussed that we do not currently have a definitive diagnosis that we are currently narrowing down the options.  We will do an MPN work-up and attempt to determine if he has some form of hyper proliferative disorder that potentially would be easier to treat then MDS.  We talked about treatments for MDS, however the patient noted that he would not want any chemotherapy regimens.  I did briefly note that hypomethylation therapy is not technically chemotherapy, but we can discuss this in greater detail once the diagnosis is more clear.  Given his advanced age I would note that supportive therapy with transfusions would not be an unreasonable option if this was consistent with his goals of care.  In the event this is a treatable condition such as CML that would markedly change the prognosis and our decision to proceed forward therapy.  #Normocytic Anemia --bone marrow biopsy on 11/26/2019 showed hypercellular marrow with concern for MDS vs MPN. MDS panel negative for definitive mutation --will have patient return for MPN lab workup, to include JAK2, CALR, MPL, and BCR/ABL --no indication for blood transfusion at this time -- d/c PO iron therapy.  --RTC once MPN workup has been completed.   No orders of the defined types were placed in this encounter.  All questions were answered. The  patient knows to call the clinic with any problems, questions or concerns.  A total of more than 15 minutes were spent on this encounter and over half of that time was spent on counseling and coordination of care as outlined above.   Ledell Peoples, MD Department of Hematology/Oncology [place] [facility] Phone: [phone] Pager:  [phone] Email: [email or web address]  12/11/2019 6:50 PM

## 2019-12-12 DIAGNOSIS — L570 Actinic keratosis: Secondary | ICD-10-CM | POA: Diagnosis not present

## 2019-12-12 DIAGNOSIS — D692 Other nonthrombocytopenic purpura: Secondary | ICD-10-CM | POA: Diagnosis not present

## 2019-12-12 DIAGNOSIS — L209 Atopic dermatitis, unspecified: Secondary | ICD-10-CM | POA: Diagnosis not present

## 2019-12-12 DIAGNOSIS — D469 Myelodysplastic syndrome, unspecified: Secondary | ICD-10-CM | POA: Diagnosis not present

## 2019-12-12 DIAGNOSIS — L820 Inflamed seborrheic keratosis: Secondary | ICD-10-CM | POA: Diagnosis not present

## 2019-12-13 ENCOUNTER — Other Ambulatory Visit: Payer: Self-pay | Admitting: *Deleted

## 2019-12-13 DIAGNOSIS — D464 Refractory anemia, unspecified: Secondary | ICD-10-CM

## 2019-12-13 DIAGNOSIS — D649 Anemia, unspecified: Secondary | ICD-10-CM | POA: Diagnosis not present

## 2019-12-14 ENCOUNTER — Inpatient Hospital Stay: Payer: Medicare Other

## 2019-12-14 ENCOUNTER — Other Ambulatory Visit: Payer: Self-pay | Admitting: *Deleted

## 2019-12-14 ENCOUNTER — Other Ambulatory Visit: Payer: Self-pay

## 2019-12-14 DIAGNOSIS — D464 Refractory anemia, unspecified: Secondary | ICD-10-CM | POA: Diagnosis not present

## 2019-12-14 DIAGNOSIS — D649 Anemia, unspecified: Secondary | ICD-10-CM

## 2019-12-14 LAB — SAMPLE TO BLOOD BANK

## 2019-12-14 LAB — PREPARE RBC (CROSSMATCH)

## 2019-12-14 LAB — CBC WITH DIFFERENTIAL (CANCER CENTER ONLY)
Abs Immature Granulocytes: 2.82 10*3/uL — ABNORMAL HIGH (ref 0.00–0.07)
Basophils Absolute: 0.2 10*3/uL — ABNORMAL HIGH (ref 0.0–0.1)
Basophils Relative: 1 %
Eosinophils Absolute: 1.3 10*3/uL — ABNORMAL HIGH (ref 0.0–0.5)
Eosinophils Relative: 4 %
HCT: 23.7 % — ABNORMAL LOW (ref 39.0–52.0)
Hemoglobin: 7.8 g/dL — ABNORMAL LOW (ref 13.0–17.0)
Immature Granulocytes: 9 %
Lymphocytes Relative: 6 %
Lymphs Abs: 1.7 10*3/uL (ref 0.7–4.0)
MCH: 31.1 pg (ref 26.0–34.0)
MCHC: 32.9 g/dL (ref 30.0–36.0)
MCV: 94.4 fL (ref 80.0–100.0)
Monocytes Absolute: 0.3 10*3/uL (ref 0.1–1.0)
Monocytes Relative: 1 %
Neutro Abs: 23.9 10*3/uL — ABNORMAL HIGH (ref 1.7–7.7)
Neutrophils Relative %: 79 %
Platelet Count: 1718 10*3/uL (ref 150–400)
RBC: 2.51 MIL/uL — ABNORMAL LOW (ref 4.22–5.81)
RDW: 20.2 % — ABNORMAL HIGH (ref 11.5–15.5)
WBC Count: 30.1 10*3/uL — ABNORMAL HIGH (ref 4.0–10.5)
nRBC: 0.1 % (ref 0.0–0.2)

## 2019-12-14 LAB — CMP (CANCER CENTER ONLY)
ALT: 16 U/L (ref 0–44)
AST: 18 U/L (ref 15–41)
Albumin: 3 g/dL — ABNORMAL LOW (ref 3.5–5.0)
Alkaline Phosphatase: 53 U/L (ref 38–126)
Anion gap: 8 (ref 5–15)
BUN: 25 mg/dL — ABNORMAL HIGH (ref 8–23)
CO2: 24 mmol/L (ref 22–32)
Calcium: 8.9 mg/dL (ref 8.9–10.3)
Chloride: 106 mmol/L (ref 98–111)
Creatinine: 1.5 mg/dL — ABNORMAL HIGH (ref 0.61–1.24)
GFR, Est AFR Am: 46 mL/min — ABNORMAL LOW (ref >60)
GFR, Estimated: 40 mL/min — ABNORMAL LOW (ref >60)
Glucose, Bld: 138 mg/dL — ABNORMAL HIGH (ref 70–99)
Potassium: 4.3 mmol/L (ref 3.5–5.1)
Sodium: 138 mmol/L (ref 135–145)
Total Bilirubin: 0.3 mg/dL (ref 0.3–1.2)
Total Protein: 6 g/dL — ABNORMAL LOW (ref 6.5–8.1)

## 2019-12-14 LAB — ABO/RH: ABO/RH(D): A POS

## 2019-12-14 MED ORDER — ACETAMINOPHEN 325 MG PO TABS
ORAL_TABLET | ORAL | Status: AC
  Filled 2019-12-14: qty 2

## 2019-12-14 MED ORDER — SODIUM CHLORIDE 0.9% IV SOLUTION
250.0000 mL | Freq: Once | INTRAVENOUS | Status: AC
  Administered 2019-12-14: 250 mL via INTRAVENOUS
  Filled 2019-12-14: qty 250

## 2019-12-14 MED ORDER — ACETAMINOPHEN 325 MG PO TABS
650.0000 mg | ORAL_TABLET | Freq: Once | ORAL | Status: AC
  Administered 2019-12-14: 650 mg via ORAL

## 2019-12-14 NOTE — Patient Instructions (Signed)

## 2019-12-15 LAB — BPAM RBC
Blood Product Expiration Date: 202101302359
ISSUE DATE / TIME: 202101081222
Unit Type and Rh: 6200

## 2019-12-15 LAB — TYPE AND SCREEN
ABO/RH(D): A POS
Antibody Screen: NEGATIVE
Unit division: 0

## 2019-12-15 LAB — ERYTHROPOIETIN: Erythropoietin: 110 m[IU]/mL — ABNORMAL HIGH (ref 2.6–18.5)

## 2019-12-18 ENCOUNTER — Telehealth: Payer: Self-pay | Admitting: *Deleted

## 2019-12-18 NOTE — Telephone Encounter (Signed)
Patient wanted to find out if he was approved to get the COVID vaccine.  Per Dr. Lorenso Courier he can proceed with getting the vaccine.

## 2019-12-24 DIAGNOSIS — L98499 Non-pressure chronic ulcer of skin of other sites with unspecified severity: Secondary | ICD-10-CM | POA: Diagnosis not present

## 2019-12-24 DIAGNOSIS — D485 Neoplasm of uncertain behavior of skin: Secondary | ICD-10-CM | POA: Diagnosis not present

## 2019-12-24 DIAGNOSIS — L820 Inflamed seborrheic keratosis: Secondary | ICD-10-CM | POA: Diagnosis not present

## 2019-12-24 DIAGNOSIS — L209 Atopic dermatitis, unspecified: Secondary | ICD-10-CM | POA: Diagnosis not present

## 2019-12-24 DIAGNOSIS — L821 Other seborrheic keratosis: Secondary | ICD-10-CM | POA: Diagnosis not present

## 2019-12-24 DIAGNOSIS — R238 Other skin changes: Secondary | ICD-10-CM | POA: Diagnosis not present

## 2019-12-24 NOTE — Progress Notes (Signed)
[place] Telephone:(336) [phone]   Fax:(336) [phone]  PROGRESS NOTE  Patient Care Team: Maury Dus, MD as PCP - General (Family Medicine) [place], Peter M, MD as PCP - Cardiology (Cardiology)  Hematological/Oncological History  #Myelodysplastic Syndrome/ NOS Myeloproliferative Syndrome 1)07/26/2018: WBC 5.1, Hgb 11.6, MCV 94.4, Plt 363 2) 9//2020: WBC 25.1, Hgb 9.2, Plt 1083, MCV 93.2 (in context of eczema flare) 3) 09/20/2019: Iron 128, TIBC 242 (low), Iron sat 53%, transferrin 173 4) 10/15/2019: WBC 14.3, Hgb 8.8, Plt 485, MCV 90.6. FOB negative 5) 11/07/2019: Establish care with Dr. Lorenso Rodriguez 6) 11/26/2019: Bone marrow biopsy performed. Showed hypercellular marrow with 9% blasts, concern for MDS vs MPN. MDS panel negative for definitive mutation. WBC 29.3, Hgb 8.3, Plt 1008.  7) 12/14/2019: WBC 30.1, Hgb 7.8, Plt 1718. Received 1 units of PRBC for symptomatic anemia. BCR/ABL and JAK2 w/ reflex panels sent. Positive for U2AF1 and SRSF2 gene mutations. Neither are canonical mutations for MPN/MDS  Interval History:  Patrick Rodriguez. 86 y.o. male with medical history significant for leukocytosis, thrombocytosis, and anemia presents for a follow up visit. The patient's last visit was on 12/10/19. In the interim since the last visit Patrick Rodriguez notes he has been well.  He reports that after receiving the blood transfusion on 12/14/2019 he noticed an immediate increase in his energy and performance status.  He was delighted with how he felt and notes that he continues to feel well since that time.  He reports that he is not having a hard time breathing and has had a decrease in the feeling of being cold.  The only other major issue he has been dealing with is treatment of his eczema for which he recently started [place].  On further discussion we talked about the nature of his disease and the fact that he carries 2 nonclinical mutations but no targetable mutations at this time.  We  also discussed that this could be treated as an MDS with hypomethylating therapy, but he noted that that is not something that he would want to consider at this time.  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  On further discussion he is having no clear signs or symptoms of active bleed, though he does report he had some bleeding from his upper and lower lips after conference call.  He does note that he was having some joint aches and pains for which he was taking a full dose aspirins prior to this event for muscle pain. A full 10 point ROS is listed below.   MEDICAL HISTORY:  Past Medical History:  Diagnosis Date  . Allergic rhinitis   . Carotid artery stenosis    12/2011:  50-69% on the left and 1-49% on the right;  Carotid US (11/2013): Bilateral 40-59%; repeat 1 year  . Cataract   . Coronary artery disease    a.  s/p stenting of the LAD in 2000;  b. Lexiscan Myoview (12/2013): No ischemia, EF 70%; normal study  . ED (erectile dysfunction)   . GERD (gastroesophageal reflux disease)   . Hyperlipidemia   . Hyperplastic colon polyp 11/15/2013   x3  . Hypertension   . Myocardial infarction ([place])   . Normal echocardiogram Jan 2013   EF of 55% and no wall motion abnormalities  . Normal nuclear stress test 2008  . Obesity   . Palpitations    Holter (11/2013):  NSR, sinus brady, PVCs, rare blocked PAC, no sig arrhythmia  . Substance abuse ([facility])   . Syncope Jan 2013   felt to be vasovagal; had normal echo, carotids ok, 1st degree AV block with mention (but no tracing) of 2nd degree type I AV block while in [place]  . Tubular adenoma 11/15/2013   x2    SURGICAL HISTORY: Past Surgical History:  Procedure Laterality Date  . APPENDECTOMY    . CARDIAC CATHETERIZATION  01/20/1999   EF 60%  . CARDIOVASCULAR STRESS TEST  04/19/2007   EF 74%  . CORONARY STENT PLACEMENT  2000   LAD  . DOPPLER ECHOCARDIOGRAPHY  02/06/1998   EF 60%  .  TONSILLECTOMY      SOCIAL HISTORY: Social History   Socioeconomic History  . Marital status: Married    Spouse name: Not on file  . Number of children: Y  . Years of education: Not on file  . Highest education level: Not on file  Occupational History  . Occupation: traveling paster  Tobacco Use  . Smoking status: Former Smoker    Packs/day: 2.00    Years: 28.00    Pack years: 56.00    Types: Cigarettes    Quit date: 12/06/1968    Years since quitting: 51.0  . Smokeless tobacco: Never Used  Substance and Sexual Activity  . Alcohol use: No    Alcohol/week: 0.0 standard drinks  . Drug use: No  . Sexual activity: Not on file  Other Topics Concern  . Not on file  Social History Narrative  . Not on file   Social Determinants of Health   Financial Resource Strain:   . Difficulty of Paying Living Expenses: Not on file  Food Insecurity:   . Worried About Running Out of Food in the Last Year: Not on file  . Ran Out of Food in the Last Year: Not on file  Transportation Needs:   . Lack of Transportation (Medical): Not on file  . Lack of Transportation (Non-Medical): Not on file  Physical Activity:   . Days of Exercise per Week: Not on file  . Minutes of Exercise per Session: Not on file  Stress:   . Feeling of Stress : Not on file  Social Connections:   . Frequency of Communication with Friends and Family: Not on file  . Frequency of Social Gatherings with Friends and Family: Not on file  . Attends Religious Services: Not on file  . Active Member of Clubs or Organizations: Not on file  . Attends Club or Organization Meetings: Not on file  . Marital Status: Not on file  Intimate Partner Violence:   . Fear of Current or Ex-Partner: Not on file  . Emotionally Abused: Not on file  . Physically Abused: Not on file  . Sexually Abused: Not on file    FAMILY HISTORY: Family History  Problem Relation Age of Onset  . Aneurysm Mother   . Stomach cancer Father   . Cancer  Father   . Cancer Sister     ALLERGIES:  is allergic to hctz [hydrochlorothiazide].  MEDICATIONS:  Current Outpatient Medications  Medication Sig Dispense Refill  . Dupilumab (DUPIXENT [place]) Inject into the skin every 14 (fourteen) days.    . amLODipine (NORVASC) 10 MG tablet TAKE 1 TABLET BY MOUTH DAILY 90 tablet 3  . Ascorbic Acid (VITAMIN C) 1000 MG tablet Take 1,000 mg by mouth daily.      . aspirin 81 MG tablet Take 81   mg by mouth daily.    . b complex vitamins tablet Take 1 tablet by mouth daily.      . Coenzyme Q10 (CO Q-10) 100 MG CAPS Take 1 capsule by mouth every morning.    . doxazosin (CARDURA) 4 MG tablet Take 1 tablet (4 mg total) by mouth daily. 90 tablet 1  . magnesium gluconate (MAGONATE) 500 MG tablet Take 500 mg by mouth daily.      . Nattokinase 100 MG CAPS Take by mouth daily.      . Nutritional Supplements (SALMON OIL) CAPS Take 1 capsule by mouth every morning.    . omeprazole (PRILOSEC) 40 MG capsule Take 40 mg by mouth daily.    . Red Yeast Rice Extract (RED YEAST RICE PO) Take 1 tablet by mouth 3 (three) times a week.    . vitamin E 400 UNIT capsule Take 400 Units by mouth daily.       No current facility-administered medications for this visit.    REVIEW OF SYSTEMS:   Constitutional: ( - ) fevers, ( - )  chills , ( - ) night sweats Eyes: ( - ) blurriness of vision, ( - ) double vision, ( - ) watery eyes Ears, nose, mouth, throat, and face: ( - ) mucositis, ( - ) sore throat Respiratory: ( - ) cough, ( - ) dyspnea, ( - ) wheezes Cardiovascular: ( - ) palpitation, ( - ) chest discomfort, ( - ) lower extremity swelling Gastrointestinal:  ( - ) nausea, ( - ) heartburn, ( - ) change in bowel habits Skin: ( - ) abnormal skin rashes Lymphatics: ( - ) new lymphadenopathy, ( - ) easy bruising Neurological: ( - ) numbness, ( - ) tingling, ( - ) new weaknesses Behavioral/Psych: ( - ) mood change, ( - ) new changes  All other systems were reviewed with the patient  and are negative.  PHYSICAL EXAMINATION: ECOG PERFORMANCE STATUS: 1 - Symptomatic but completely ambulatory  Vitals:   12/27/19 1147  BP: (!) 178/65  Pulse: 81  Resp: 16  Temp: 98.9 F (37.2 C)  SpO2: 99%   Filed Weights   12/27/19 1147  Weight: 152 lb 12.8 oz (69.3 kg)    GENERAL: well appearing elderly Caucasian male in NAD SKIN: skin color, texture, turgor are normal, no rashes or significant lesions EYES: conjunctiva are pink and non-injected, sclera clear LUNGS: clear to auscultation and percussion with normal breathing effort HEART: regular rate & rhythm and no murmurs and +2 bilateral lower extremity edema Musculoskeletal: no cyanosis of digits and no clubbing  PSYCH: alert & oriented x 3, fluent speech NEURO: no focal motor/sensory deficits  LABORATORY DATA:  I have reviewed the data as listed CBC Latest Ref Rng & Units 12/27/2019 12/14/2019 11/26/2019  WBC 4.0 - 10.5 K/uL 20.6(H) 30.1(H) 29.3(H)  Hemoglobin 13.0 - 17.0 g/dL 9.5(L) 7.8(L) 8.3(L)  Hematocrit 39.0 - 52.0 % 28.9(L) 23.7(L) 25.6(L)  Platelets 150 - 400 K/uL 1,052(HH) 1,718(HH) 1,008(HH)    CMP Latest Ref Rng & Units 12/27/2019 12/14/2019 11/07/2019  Glucose 70 - 99 mg/dL 141(H) 138(H) 99  BUN 8 - 23 mg/dL 28(H) 25(H) 25(H)  Creatinine 0.61 - 1.24 mg/dL 1.25(H) 1.50(H) 1.47(H)  Sodium 135 - 145 mmol/L 137 138 141  Potassium 3.5 - 5.1 mmol/L 4.4 4.3 4.2  Chloride 98 - 111 mmol/L 105 106 106  CO2 22 - 32 mmol/L 25 24 24  Calcium 8.9 - 10.3 mg/dL 8.9 8.9 9.3  Total   Protein 6.5 - 8.1 g/dL 6.3(L) 6.0(L) 6.9  Total Bilirubin 0.3 - 1.2 mg/dL 0.4 0.3 0.3  Alkaline Phos 38 - 126 U/L 49 53 55  AST 15 - 41 U/L 21 18 22  ALT 0 - 44 U/L 20 16 19    RADIOGRAPHIC STUDIES: No results found.  ASSESSMENT & PLAN Patrick P Bourne Jr.86 y.o.malewith medical history significant for CAD s/p stent in 2000 and 2015, HTN, and GERD who presents for evaluation of anemia.His anemia is normocytic and developed relatively  recently. He has near normal labs dating back to August 2019 however 1 year later in September 2020 he had a decline in his hemoglobin to 9.2. On recheck over the last 2 months it has leveled off at 8.8. During this time he has had other hematological abnormalities such as a pronounced leukocytosis as well as thrombocytosis.   Bone marrow biopsy performed on 11/26/2019 showed a hypercellular marrow consistent with MDS versus MPN with 9% blasts.  MDS panel was negative for definitive mutation at that time. Additional workup showed no JAK2, CALR, MPL, or BCR/ABL. He does carry 2 mutations  U2AF1 and SRSF2 which are of unclear clinical significance  We discussed that we do not currently have a definitive diagnosis that we are currently narrowing down the options.  We will do an MPN work-up and attempt to determine if he has some form of hyper proliferative disorder that potentially would be easier to treat then MDS.  We talked about treatments for MDS, however the patient noted that he would not want any chemotherapy regimens.  I did briefly note that hypomethylation therapy is not technically chemotherapy, but he declined these IV treatments at this time.  Given his advanced age I would agree that supportive therapy with transfusions would not be an unreasonable option if this was consistent with his goals of care.    #Myelodysplastic Syndrome/ NOS Myeloproliferative Syndrome --bone marrow biopsy on 11/26/2019 showed hypercellular marrow with concern for MDS vs MPN. MDS panel negative for definitive mutation and canonical mutations such as JAK2, CALR, MPL, and BCR/ABL were negative. He does carry 2 mutations  U2AF1 and SRSF2 which are of unclear clinical significance --no indication for blood transfusion at this time, will have patient return for blood work and possible transfusion in 4 weeks time.  -- d/c PO iron therapy.  --RTC in approximately 3 months to reassess or sooner if indicated by his  blood work.   All questions were answered. The patient knows to call the clinic with any problems, questions or concerns.  A total of more than 30 minutes were spent on this encounter and over half of that time was spent on counseling and coordination of care as outlined above.   John T. Dorsey, MD Department of Hematology/Oncology [facility] Phone: [phone] Pager: [phone] Email: [email or web address]  12/27/2019 6:03 PM 

## 2019-12-26 ENCOUNTER — Other Ambulatory Visit: Payer: Self-pay | Admitting: *Deleted

## 2019-12-26 DIAGNOSIS — D464 Refractory anemia, unspecified: Secondary | ICD-10-CM

## 2019-12-27 ENCOUNTER — Encounter: Payer: Self-pay | Admitting: Hematology and Oncology

## 2019-12-27 ENCOUNTER — Inpatient Hospital Stay: Payer: Medicare Other | Admitting: Hematology and Oncology

## 2019-12-27 ENCOUNTER — Other Ambulatory Visit: Payer: Self-pay

## 2019-12-27 ENCOUNTER — Inpatient Hospital Stay: Payer: Medicare Other

## 2019-12-27 VITALS — BP 178/65 | HR 81 | Temp 98.9°F | Resp 16 | Ht 68.5 in | Wt 152.8 lb

## 2019-12-27 DIAGNOSIS — D469 Myelodysplastic syndrome, unspecified: Secondary | ICD-10-CM | POA: Diagnosis not present

## 2019-12-27 DIAGNOSIS — D473 Essential (hemorrhagic) thrombocythemia: Secondary | ICD-10-CM

## 2019-12-27 DIAGNOSIS — D464 Refractory anemia, unspecified: Secondary | ICD-10-CM

## 2019-12-27 DIAGNOSIS — D75839 Thrombocytosis, unspecified: Secondary | ICD-10-CM

## 2019-12-27 DIAGNOSIS — D649 Anemia, unspecified: Secondary | ICD-10-CM | POA: Diagnosis not present

## 2019-12-27 LAB — CMP (CANCER CENTER ONLY)
ALT: 20 U/L (ref 0–44)
AST: 21 U/L (ref 15–41)
Albumin: 3.3 g/dL — ABNORMAL LOW (ref 3.5–5.0)
Alkaline Phosphatase: 49 U/L (ref 38–126)
Anion gap: 7 (ref 5–15)
BUN: 28 mg/dL — ABNORMAL HIGH (ref 8–23)
CO2: 25 mmol/L (ref 22–32)
Calcium: 8.9 mg/dL (ref 8.9–10.3)
Chloride: 105 mmol/L (ref 98–111)
Creatinine: 1.25 mg/dL — ABNORMAL HIGH (ref 0.61–1.24)
GFR, Est AFR Am: 58 mL/min — ABNORMAL LOW (ref >60)
GFR, Estimated: 50 mL/min — ABNORMAL LOW (ref >60)
Glucose, Bld: 141 mg/dL — ABNORMAL HIGH (ref 70–99)
Potassium: 4.4 mmol/L (ref 3.5–5.1)
Sodium: 137 mmol/L (ref 135–145)
Total Bilirubin: 0.4 mg/dL (ref 0.3–1.2)
Total Protein: 6.3 g/dL — ABNORMAL LOW (ref 6.5–8.1)

## 2019-12-27 LAB — CBC WITH DIFFERENTIAL (CANCER CENTER ONLY)
Abs Immature Granulocytes: 1.8 10*3/uL — ABNORMAL HIGH (ref 0.00–0.07)
Basophils Absolute: 0 10*3/uL (ref 0.0–0.1)
Basophils Relative: 0 %
Eosinophils Absolute: 1.2 10*3/uL — ABNORMAL HIGH (ref 0.0–0.5)
Eosinophils Relative: 6 %
HCT: 28.9 % — ABNORMAL LOW (ref 39.0–52.0)
Hemoglobin: 9.5 g/dL — ABNORMAL LOW (ref 13.0–17.0)
Immature Granulocytes: 9 %
Lymphocytes Relative: 6 %
Lymphs Abs: 1.3 10*3/uL (ref 0.7–4.0)
MCH: 30 pg (ref 26.0–34.0)
MCHC: 32.9 g/dL (ref 30.0–36.0)
MCV: 91.2 fL (ref 80.0–100.0)
Monocytes Absolute: 0.3 10*3/uL (ref 0.1–1.0)
Monocytes Relative: 1 %
Neutro Abs: 16 10*3/uL — ABNORMAL HIGH (ref 1.7–7.7)
Neutrophils Relative %: 78 %
Platelet Count: 1052 10*3/uL (ref 150–400)
RBC: 3.17 MIL/uL — ABNORMAL LOW (ref 4.22–5.81)
RDW: 18.4 % — ABNORMAL HIGH (ref 11.5–15.5)
WBC Count: 20.6 10*3/uL — ABNORMAL HIGH (ref 4.0–10.5)
nRBC: 0 % (ref 0.0–0.2)

## 2019-12-27 LAB — SAMPLE TO BLOOD BANK

## 2019-12-27 NOTE — Progress Notes (Signed)
11:36, Verbally notified with readback,  Platelets 1,052 to E. Linus Orn, RN

## 2019-12-28 LAB — BCR ABL1 FISH (GENPATH)

## 2019-12-28 LAB — JAK2 (INCLUDING V617F AND EXON 12), MPL,& CALR W/RFL MPN PANEL (NGS)

## 2020-01-02 NOTE — Telephone Encounter (Signed)
Entered in error

## 2020-01-03 ENCOUNTER — Telehealth: Payer: Self-pay | Admitting: Hematology and Oncology

## 2020-01-03 NOTE — Telephone Encounter (Signed)
Scheduled per sch msg. Called and left msg. Mailed printout  

## 2020-01-07 DIAGNOSIS — L299 Pruritus, unspecified: Secondary | ICD-10-CM | POA: Diagnosis not present

## 2020-01-07 DIAGNOSIS — C4492 Squamous cell carcinoma of skin, unspecified: Secondary | ICD-10-CM

## 2020-01-07 DIAGNOSIS — C44319 Basal cell carcinoma of skin of other parts of face: Secondary | ICD-10-CM | POA: Diagnosis not present

## 2020-01-07 DIAGNOSIS — C4491 Basal cell carcinoma of skin, unspecified: Secondary | ICD-10-CM

## 2020-01-07 DIAGNOSIS — L570 Actinic keratosis: Secondary | ICD-10-CM | POA: Diagnosis not present

## 2020-01-07 DIAGNOSIS — C44622 Squamous cell carcinoma of skin of right upper limb, including shoulder: Secondary | ICD-10-CM | POA: Diagnosis not present

## 2020-01-07 DIAGNOSIS — L209 Atopic dermatitis, unspecified: Secondary | ICD-10-CM | POA: Diagnosis not present

## 2020-01-07 HISTORY — DX: Basal cell carcinoma of skin, unspecified: C44.91

## 2020-01-07 HISTORY — DX: Squamous cell carcinoma of skin, unspecified: C44.92

## 2020-01-21 DIAGNOSIS — C44622 Squamous cell carcinoma of skin of right upper limb, including shoulder: Secondary | ICD-10-CM | POA: Diagnosis not present

## 2020-01-21 DIAGNOSIS — L820 Inflamed seborrheic keratosis: Secondary | ICD-10-CM | POA: Diagnosis not present

## 2020-01-21 DIAGNOSIS — L570 Actinic keratosis: Secondary | ICD-10-CM | POA: Diagnosis not present

## 2020-01-21 DIAGNOSIS — L209 Atopic dermatitis, unspecified: Secondary | ICD-10-CM | POA: Diagnosis not present

## 2020-01-21 DIAGNOSIS — C44319 Basal cell carcinoma of skin of other parts of face: Secondary | ICD-10-CM | POA: Diagnosis not present

## 2020-01-23 ENCOUNTER — Other Ambulatory Visit: Payer: Self-pay | Admitting: *Deleted

## 2020-01-23 ENCOUNTER — Telehealth: Payer: Self-pay | Admitting: *Deleted

## 2020-01-23 DIAGNOSIS — D469 Myelodysplastic syndrome, unspecified: Secondary | ICD-10-CM

## 2020-01-23 NOTE — Telephone Encounter (Signed)
Received call from pt as he had received a call from our scheduler about re-scheduling his appt on 01/24/20 d/t weather. Spoke with patient and he states he plans on being here tomorrow.  He understands to call if he cannot make in tomorrow

## 2020-01-24 ENCOUNTER — Inpatient Hospital Stay: Payer: Medicare Other

## 2020-01-24 ENCOUNTER — Inpatient Hospital Stay: Payer: Medicare Other | Attending: Hematology and Oncology

## 2020-01-24 ENCOUNTER — Other Ambulatory Visit: Payer: Self-pay

## 2020-01-24 DIAGNOSIS — D469 Myelodysplastic syndrome, unspecified: Secondary | ICD-10-CM | POA: Insufficient documentation

## 2020-01-24 LAB — CBC WITH DIFFERENTIAL (CANCER CENTER ONLY)
Abs Immature Granulocytes: 0.75 10*3/uL — ABNORMAL HIGH (ref 0.00–0.07)
Basophils Absolute: 0 10*3/uL (ref 0.0–0.1)
Basophils Relative: 0 %
Eosinophils Absolute: 1.3 10*3/uL — ABNORMAL HIGH (ref 0.0–0.5)
Eosinophils Relative: 7 %
HCT: 27.6 % — ABNORMAL LOW (ref 39.0–52.0)
Hemoglobin: 8.9 g/dL — ABNORMAL LOW (ref 13.0–17.0)
Immature Granulocytes: 4 %
Lymphocytes Relative: 7 %
Lymphs Abs: 1.2 10*3/uL (ref 0.7–4.0)
MCH: 30.2 pg (ref 26.0–34.0)
MCHC: 32.2 g/dL (ref 30.0–36.0)
MCV: 93.6 fL (ref 80.0–100.0)
Monocytes Absolute: 0.2 10*3/uL (ref 0.1–1.0)
Monocytes Relative: 1 %
Neutro Abs: 13.8 10*3/uL — ABNORMAL HIGH (ref 1.7–7.7)
Neutrophils Relative %: 81 %
Platelet Count: 777 10*3/uL — ABNORMAL HIGH (ref 150–400)
RBC: 2.95 MIL/uL — ABNORMAL LOW (ref 4.22–5.81)
RDW: 20.9 % — ABNORMAL HIGH (ref 11.5–15.5)
WBC Count: 17.2 10*3/uL — ABNORMAL HIGH (ref 4.0–10.5)
nRBC: 0 % (ref 0.0–0.2)

## 2020-01-24 LAB — CMP (CANCER CENTER ONLY)
ALT: 17 U/L (ref 0–44)
AST: 19 U/L (ref 15–41)
Albumin: 3.6 g/dL (ref 3.5–5.0)
Alkaline Phosphatase: 48 U/L (ref 38–126)
Anion gap: 7 (ref 5–15)
BUN: 25 mg/dL — ABNORMAL HIGH (ref 8–23)
CO2: 24 mmol/L (ref 22–32)
Calcium: 8.5 mg/dL — ABNORMAL LOW (ref 8.9–10.3)
Chloride: 107 mmol/L (ref 98–111)
Creatinine: 1.57 mg/dL — ABNORMAL HIGH (ref 0.61–1.24)
GFR, Est AFR Am: 44 mL/min — ABNORMAL LOW (ref >60)
GFR, Estimated: 38 mL/min — ABNORMAL LOW (ref >60)
Glucose, Bld: 138 mg/dL — ABNORMAL HIGH (ref 70–99)
Potassium: 4.4 mmol/L (ref 3.5–5.1)
Sodium: 138 mmol/L (ref 135–145)
Total Bilirubin: 0.3 mg/dL (ref 0.3–1.2)
Total Protein: 6.4 g/dL — ABNORMAL LOW (ref 6.5–8.1)

## 2020-01-24 LAB — SAMPLE TO BLOOD BANK

## 2020-01-24 NOTE — Progress Notes (Signed)
Based on lab work confirmed with laboratory, patient is not needing blood transfusion today. Patient aware and heading home.

## 2020-02-04 DIAGNOSIS — L570 Actinic keratosis: Secondary | ICD-10-CM | POA: Diagnosis not present

## 2020-02-04 DIAGNOSIS — D649 Anemia, unspecified: Secondary | ICD-10-CM | POA: Diagnosis not present

## 2020-02-04 DIAGNOSIS — D469 Myelodysplastic syndrome, unspecified: Secondary | ICD-10-CM | POA: Diagnosis not present

## 2020-02-04 DIAGNOSIS — L209 Atopic dermatitis, unspecified: Secondary | ICD-10-CM | POA: Diagnosis not present

## 2020-02-04 DIAGNOSIS — D692 Other nonthrombocytopenic purpura: Secondary | ICD-10-CM | POA: Diagnosis not present

## 2020-02-11 ENCOUNTER — Ambulatory Visit: Payer: Medicare Other | Admitting: Cardiology

## 2020-02-14 ENCOUNTER — Other Ambulatory Visit: Payer: Self-pay | Admitting: Physician Assistant

## 2020-02-19 ENCOUNTER — Encounter: Payer: Self-pay | Admitting: Dermatology

## 2020-02-19 ENCOUNTER — Ambulatory Visit: Payer: Medicare Other | Admitting: Dermatology

## 2020-02-19 ENCOUNTER — Other Ambulatory Visit: Payer: Self-pay

## 2020-02-19 DIAGNOSIS — D649 Anemia, unspecified: Secondary | ICD-10-CM | POA: Diagnosis not present

## 2020-02-19 DIAGNOSIS — L209 Atopic dermatitis, unspecified: Secondary | ICD-10-CM

## 2020-02-19 DIAGNOSIS — D692 Other nonthrombocytopenic purpura: Secondary | ICD-10-CM | POA: Diagnosis not present

## 2020-02-19 DIAGNOSIS — L570 Actinic keratosis: Secondary | ICD-10-CM | POA: Diagnosis not present

## 2020-02-19 MED ORDER — DUPILUMAB 300 MG/2ML ~~LOC~~ SOAJ: 300.0000 mg | Freq: Once | SUBCUTANEOUS | Status: DC

## 2020-02-19 MED ORDER — DUPILUMAB 300 MG/2ML ~~LOC~~ SOAJ
300.0000 mg | SUBCUTANEOUS | Status: DC
  Administered 2020-02-19 – 2020-06-04 (×4): 300 mg via SUBCUTANEOUS

## 2020-02-19 NOTE — Progress Notes (Signed)
   Follow-Up Visit   Subjective  Patrick Rodriguez. is a 86 y.o. male who presents for the following: atopic dermatitis (pt still experiencing itching. His last infusion for anemia was last week. ).  The following portions of the chart were reviewed this encounter and updated as appropriate:     Review of Systems: No other skin or systemic complaints.  Objective  Well appearing patient in no apparent distress; mood and affect are within normal limits.  The scalp, arms, and face were examined today.   Objective  scalp: Erythematous thin papules/macules with gritty scale.   Objective  Face, trunk, extremities: No active rash today  Objective  B/L arms: Violaceous macules and patches.   Assessment & Plan  AK (actinic keratosis) scalp  Recheck at follow up appointment. Plan to treat any residual.  Anemia, unspecified type Left Breast  Continue care with hematologist. Some or all his residual pruritus may be due to his anemia. We will review with him at next fu in 2 weeks whether his itch is improved after his transfusion coming up.   Atopic dermatitis, unspecified type Face, trunk, extremities  Severe but improved on treatment. Continue Dupixent injections 300mg /83mL SQ QOW, mild soaps, and moisturizers. Dupxient auto injector pen 300mg /30mL injected SQ into the R upper arm post. Lot number: 08/05/[id number], expiration date: [id number], CMA Pt is improving on treatment but has some persistent itch.  Ordered Medications: Dupilumab SOPN 300 mg  Other nonthrombocytopenic purpura (HCC) B/L arms  The patient will observe these symptoms, and report promptly any worsening or unexpected persistence.     Return in about 2 weeks (around 03/04/2020).

## 2020-02-20 ENCOUNTER — Telehealth: Payer: Self-pay

## 2020-02-20 ENCOUNTER — Other Ambulatory Visit: Payer: Self-pay | Admitting: *Deleted

## 2020-02-20 DIAGNOSIS — D469 Myelodysplastic syndrome, unspecified: Secondary | ICD-10-CM

## 2020-02-20 NOTE — Telephone Encounter (Signed)
Opened in error

## 2020-02-21 ENCOUNTER — Other Ambulatory Visit: Payer: Self-pay

## 2020-02-21 ENCOUNTER — Inpatient Hospital Stay: Payer: Medicare Other

## 2020-02-21 ENCOUNTER — Other Ambulatory Visit: Payer: Self-pay | Admitting: *Deleted

## 2020-02-21 ENCOUNTER — Inpatient Hospital Stay: Payer: Medicare Other | Attending: Hematology and Oncology

## 2020-02-21 DIAGNOSIS — D469 Myelodysplastic syndrome, unspecified: Secondary | ICD-10-CM

## 2020-02-21 LAB — CBC WITH DIFFERENTIAL (CANCER CENTER ONLY)
Abs Immature Granulocytes: 0.77 10*3/uL — ABNORMAL HIGH (ref 0.00–0.07)
Basophils Absolute: 0.1 10*3/uL (ref 0.0–0.1)
Basophils Relative: 0 %
Eosinophils Absolute: 1.2 10*3/uL — ABNORMAL HIGH (ref 0.0–0.5)
Eosinophils Relative: 6 %
HCT: 26.1 % — ABNORMAL LOW (ref 39.0–52.0)
Hemoglobin: 8.5 g/dL — ABNORMAL LOW (ref 13.0–17.0)
Immature Granulocytes: 4 %
Lymphocytes Relative: 6 %
Lymphs Abs: 1.2 10*3/uL (ref 0.7–4.0)
MCH: 30.1 pg (ref 26.0–34.0)
MCHC: 32.6 g/dL (ref 30.0–36.0)
MCV: 92.6 fL (ref 80.0–100.0)
Monocytes Absolute: 0.4 10*3/uL (ref 0.1–1.0)
Monocytes Relative: 2 %
Neutro Abs: 16.3 10*3/uL — ABNORMAL HIGH (ref 1.7–7.7)
Neutrophils Relative %: 82 %
Platelet Count: 897 10*3/uL — ABNORMAL HIGH (ref 150–400)
RBC: 2.82 MIL/uL — ABNORMAL LOW (ref 4.22–5.81)
RDW: 22.2 % — ABNORMAL HIGH (ref 11.5–15.5)
WBC Count: 19.9 10*3/uL — ABNORMAL HIGH (ref 4.0–10.5)
nRBC: 0 % (ref 0.0–0.2)

## 2020-02-21 LAB — PREPARE RBC (CROSSMATCH)

## 2020-02-21 LAB — SAMPLE TO BLOOD BANK

## 2020-02-21 MED ORDER — ACETAMINOPHEN 325 MG PO TABS
ORAL_TABLET | ORAL | Status: AC
  Filled 2020-02-21: qty 2

## 2020-02-21 MED ORDER — ACETAMINOPHEN 325 MG PO TABS
650.0000 mg | ORAL_TABLET | Freq: Once | ORAL | Status: AC
  Administered 2020-02-21: 10:00:00 650 mg via ORAL

## 2020-02-21 MED ORDER — SODIUM CHLORIDE 0.9% IV SOLUTION
250.0000 mL | Freq: Once | INTRAVENOUS | Status: AC
  Administered 2020-02-21: 250 mL via INTRAVENOUS
  Filled 2020-02-21: qty 250

## 2020-02-21 NOTE — Progress Notes (Signed)
Spoke with patient after CBC results available. HGB is 8.5  Pt states he feels more SOB and more fatigued in the last few days. Informed Dr. Lorenso Courier that patient is more symptomatic in regards to his anemia today. Received orders to tranfuse 1 unit of PRBC's today. Pt and infusion area aware.

## 2020-02-21 NOTE — Patient Instructions (Signed)

## 2020-02-22 LAB — TYPE AND SCREEN
ABO/RH(D): A POS
Antibody Screen: NEGATIVE
Unit division: 0

## 2020-02-22 LAB — BPAM RBC
Blood Product Expiration Date: 202104152359
ISSUE DATE / TIME: 202103181032
Unit Type and Rh: 6200

## 2020-03-03 ENCOUNTER — Ambulatory Visit: Payer: Medicare Other | Admitting: Dermatology

## 2020-03-03 ENCOUNTER — Other Ambulatory Visit: Payer: Self-pay

## 2020-03-03 DIAGNOSIS — D692 Other nonthrombocytopenic purpura: Secondary | ICD-10-CM | POA: Diagnosis not present

## 2020-03-03 DIAGNOSIS — L209 Atopic dermatitis, unspecified: Secondary | ICD-10-CM | POA: Diagnosis not present

## 2020-03-03 DIAGNOSIS — D619 Aplastic anemia, unspecified: Secondary | ICD-10-CM

## 2020-03-03 DIAGNOSIS — Z872 Personal history of diseases of the skin and subcutaneous tissue: Secondary | ICD-10-CM

## 2020-03-03 DIAGNOSIS — L853 Xerosis cutis: Secondary | ICD-10-CM

## 2020-03-03 DIAGNOSIS — S51812A Laceration without foreign body of left forearm, initial encounter: Secondary | ICD-10-CM | POA: Diagnosis not present

## 2020-03-03 NOTE — Progress Notes (Signed)
   Follow-Up Visit   Subjective  Patrick Rodriguez. is a 86 y.o. male who presents for the following: Rash (Atopic Derm, Dupixent injection today.). Patient states that he is still itching a lot. Although the patient is still itching he admits that his itching is remarkably better than prior to starting [place].  He had a recent blood transfusion for his aplastic anemia but the transfusion did not seem to help his itch.  We have discussed in the past that some of his itch may be due to his anemia and aplastic anemia which can cause itch. The patient's rash is overall improved and he is very pleased with this improvement and decrease in his symptoms.  Patient lacerated his arm 03/02/2020.  The following portions of the chart were reviewed this encounter and updated as appropriate:     Review of Systems: No other skin or systemic complaints.  Objective  Well appearing patient in no apparent distress; mood and affect are within normal limits.  All skin waist up examined.  Objective  Trunk and extremities: No active rash today        Severe but improved on treatment. Continue Dupixent injections 300mg /70mL SQ QOW, mild soaps, and moisturizers. Dupxient auto injector pen 300mg /2mL injected SQ into the Left Deltoid. Lot number: [id number], expiration date: 10/05/2021  Pt is improving on treatment but has some persistent itch.         Objective  Scalp: History of Erythematous thin papules/macules with gritty scale  Objective  B/L forearms: Violaceous macules and patches.   Objective  Trunk and extremities: Rough, dry skin with that may have scales or small cracks.  Assessment & Plan  Atopic dermatitis, unspecified type Trunk and extremities Very severe and possibly exacerbated by his diagnosis of aplastic anemia for which he is followed by hematology and getting transfusions.  He has improved on his Dupixent treatment. Patient will receive his dupixent injection  today. Dupixent injection performed today.  Recommend using OTC Allegra in the morning for itch and OTC benadryl at night prn itch  Patient will return in 2 weeks for Dupixent injection  Other Related Medications Dupilumab SOPN 300 mg  History of actinic keratoses Scalp  Plan on Cryotherapy on next follow up  Laceration of left forearm, initial encounter Left Forearm - Anterior Date 03/02/2020.  Wound care discussed and performed today. The patient will observe these symptoms, and report promptly any worsening or unexpected persistence.  If well, may return prn.   Other nonthrombocytopenic purpura (HCC) B/L forearms  Benign, observe.    Xerosis cutis Trunk and extremities  Continue using moisturizers  Return in about 2 weeks (around 03/17/2020) for Follow up Atopic Derm/Dupixent.

## 2020-03-08 NOTE — Progress Notes (Signed)
Cardiology Office Note    Date:  03/11/2020   ID:  Patrick Cruise., DOB November 26, 1925, MRN [id number]  PCP:  Maury Dus, MD  Cardiologist:  Dr. Martinique   Chief Complaint  Patient presents with  . Hypertension  . Coronary Artery Disease    History of Present Illness:  Patrick Rodriguez. is a 86 y.o. male with PMH of HTN, HLD, carotid artery disease, and CAD.  Patient had a syncope in 2013 which was felt to be due to dehydration.  HCTZ was stopped at the time he also had hyponatremia.  Stress Myoview in January 2015 was normal with EF 70%.  Heart monitor showed PVCs and PACs but no significant ventricular ectopy.  Due to history of anemia, he underwent a GI evaluation that demonstrated polyps and diverticulosis.  He has been diagnosed with myelodysplasia and is followed by oncology. Gets transfusions about every other month.   He reports he stays active walking and traveling when he has a chance. His skin is poor with rash. His heart skips off and on. BP still elevated. On last visit Cardura was increased.     Past Medical History:  Diagnosis Date  . Allergic rhinitis   . Carotid artery stenosis    12/2011:  50-69% on the left and 1-49% on the right;  Carotid US (11/2013): Bilateral 40-59%; repeat 1 year  . Cataract   . Coronary artery disease    a.  s/p stenting of the LAD in 2000;  b. Lexiscan Myoview (12/2013): No ischemia, EF 70%; normal study  . ED (erectile dysfunction)   . GERD (gastroesophageal reflux disease)   . Hyperlipidemia   . Hyperplastic colon polyp 11/15/2013   x3  . Hypertension   . Myocardial infarction ([place])   . Normal echocardiogram Jan 2013   EF of 55% and no wall motion abnormalities  . Normal nuclear stress test 2008  . Obesity   . Palpitations    Holter (11/2013): NSR, sinus brady, PVCs, rare blocked PAC, no sig arrhythmia  . Substance abuse ([place])   . Syncope Jan 2013   felt to be vasovagal; had normal echo, carotids ok, 1st degree AV block with mention  (but no tracing) of 2nd degree type I AV block while in [place]  . Tubular adenoma 11/15/2013   x2    Past Surgical History:  Procedure Laterality Date  . APPENDECTOMY    . CARDIAC CATHETERIZATION  01/20/1999   EF 60%  . CARDIOVASCULAR STRESS TEST  04/19/2007   EF 74%  . CORONARY STENT PLACEMENT  2000   LAD  . DOPPLER ECHOCARDIOGRAPHY  02/06/1998   EF 60%  . TONSILLECTOMY      Current Medications: Outpatient Medications Prior to Visit  Medication Sig Dispense Refill  . amLODipine (NORVASC) 10 MG tablet TAKE 1 TABLET BY MOUTH DAILY 90 tablet 3  . Ascorbic Acid (VITAMIN C) 1000 MG tablet Take 1,000 mg by mouth daily.      Marland Kitchen aspirin 81 MG tablet Take 81 mg by mouth daily.    Marland Kitchen b complex vitamins tablet Take 1 tablet by mouth daily.      . Coenzyme Q10 (CO Q-10) 100 MG CAPS Take 1 capsule by mouth every morning.    Marland Kitchen doxazosin (CARDURA) 4 MG tablet TAKE 1 TABLET BY MOUTH DAILY 90 tablet 1  . Dupilumab (DUPIXENT [place]) Inject into the skin every 14 (fourteen) days.    . DUPIXENT 300 MG/2ML SOPN     .  magnesium gluconate (MAGONATE) 500 MG tablet Take 500 mg by mouth daily.      . Multiple Vitamins-Minerals (ZINC PO) Take by mouth.    . Nattokinase 100 MG CAPS Take by mouth daily.      . Nutritional Supplements (SALMON OIL) CAPS Take 1 capsule by mouth every morning.    Marland Kitchen omeprazole (PRILOSEC) 40 MG capsule Take 40 mg by mouth daily.    . Red Yeast Rice Extract (RED YEAST RICE PO) Take 1 tablet by mouth 3 (three) times a week.    . vitamin E 400 UNIT capsule Take 400 Units by mouth daily.       Facility-Administered Medications Prior to Visit  Medication Dose Route Frequency Provider Last Rate Last Admin  . Dupilumab SOPN 300 mg  300 mg Subcutaneous Q14 Days Ralene Bathe, MD   300 mg at 03/03/20 1142     Allergies:   Hctz [hydrochlorothiazide]   Social History   Socioeconomic History  . Marital status: Married    Spouse name: Not on file  . Number of children: Y  . Years of  education: Not on file  . Highest education level: Not on file  Occupational History  . Occupation: traveling paster  Tobacco Use  . Smoking status: Former Smoker    Packs/day: 2.00    Years: 28.00    Pack years: 56.00    Types: Cigarettes    Quit date: 12/06/1968    Years since quitting: 51.2  . Smokeless tobacco: Never Used  Substance and Sexual Activity  . Alcohol use: No    Alcohol/week: 0.0 standard drinks  . Drug use: No  . Sexual activity: Not on file  Other Topics Concern  . Not on file  Social History Narrative  . Not on file   Social Determinants of Health   Financial Resource Strain:   . Difficulty of Paying Living Expenses:   Food Insecurity:   . Worried About Charity fundraiser in the Last Year:   . Arboriculturist in the Last Year:   Transportation Needs:   . Film/video editor (Medical):   Marland Kitchen Lack of Transportation (Non-Medical):   Physical Activity:   . Days of Exercise per Week:   . Minutes of Exercise per Session:   Stress:   . Feeling of Stress :   Social Connections:   . Frequency of Communication with Friends and Family:   . Frequency of Social Gatherings with Friends and Family:   . Attends Religious Services:   . Active Member of Clubs or Organizations:   . Attends Archivist Meetings:   Marland Kitchen Marital Status:      Family History:  The patient's family history includes Aneurysm in his mother; Cancer in his father and sister; Stomach cancer in his father.   ROS:   Please see the history of present illness.    ROS All other systems reviewed and are negative.   PHYSICAL EXAM:   VS:  BP (!) 152/64   Pulse 76   Ht 5\' 8"  (1.727 m)   Wt 154 lb (69.9 kg)   SpO2 97%   BMI 23.42 kg/m    GEN: Well nourished, well developed, in no acute distress  HEENT: normal  Neck: no JVD, carotid bruits, or masses Cardiac: RRR; no murmurs, rubs, or gallops,no edema  Respiratory:  clear to auscultation bilaterally, normal work of breathing GI:  soft, nontender, nondistended, + BS MS: no deformity or atrophy  Skin: warm and dry, no rash Neuro:  Alert and Oriented x 3, Strength and sensation are intact Psych: euthymic mood, full affect  Wt Readings from Last 3 Encounters:  03/11/20 154 lb (69.9 kg)  12/27/19 152 lb 12.8 oz (69.3 kg)  11/07/19 156 lb (70.8 kg)      Studies/Labs Reviewed:   EKG:  EKG is ordered today.  The ekg ordered today demonstrates NSR with first degree AV block. I have personally reviewed and interpreted this study.   Recent Labs: 11/07/2019: TSH 2.401 01/24/2020: ALT 17; BUN 25; Creatinine 1.57; Potassium 4.4; Sodium 138 02/21/2020: Hemoglobin 8.5; Platelet Count 897   Lipid Panel    Component Value Date/Time   CHOL 157 07/05/2013 0915   TRIG 46.0 07/05/2013 0915   HDL 38.90 (L) 07/05/2013 0915   CHOLHDL 4 07/05/2013 0915   VLDL 9.2 07/05/2013 0915   LDLCALC 109 (H) 07/05/2013 0915   Dated 08/07/19: cholesterol 181, triglycerides 60, HDL 50, LDL 119.   Additional studies/ records that were reviewed today include:   Normal Myoview on 12/12/2013   ASSESSMENT:    No diagnosis found.   PLAN:  In order of problems listed above:  1. Hypertension: BP still not ideal. On Cardura and amlodipine. Will add Toprol XL 25 mg daily. Avoid diuretics/ARB,ACEi due to CKD.  I will continue on his current medication.  2. CAD: Denies any anginal symptoms.  Continue aspirin  3. Hyperlipidemia: Continue red yeast rice.  4. Carotid arterial disease. [id number] LICA   Medication Adjustments/Labs and Tests Ordered: Current medicines are reviewed at length with the patient today.  Concerns regarding medicines are outlined above.  Medication changes, Labs and Tests ordered today are listed in the Patient Instructions below. Patient Instructions  Start Toprol XL 25 mg daily for blood pressure     Signed, Patrick Blackson Martinique, MD  03/11/2020 3:21 PM    [facility] [place], [place], [place]   [address] Phone: [phone]; Fax: [phone]

## 2020-03-10 DIAGNOSIS — Z961 Presence of intraocular lens: Secondary | ICD-10-CM | POA: Diagnosis not present

## 2020-03-11 ENCOUNTER — Ambulatory Visit: Payer: Medicare Other | Admitting: Cardiology

## 2020-03-11 ENCOUNTER — Encounter: Payer: Self-pay | Admitting: Cardiology

## 2020-03-11 ENCOUNTER — Other Ambulatory Visit: Payer: Self-pay

## 2020-03-11 VITALS — BP 152/64 | HR 76 | Ht 68.0 in | Wt 154.0 lb

## 2020-03-11 DIAGNOSIS — I2510 Atherosclerotic heart disease of native coronary artery without angina pectoris: Secondary | ICD-10-CM | POA: Diagnosis not present

## 2020-03-11 DIAGNOSIS — I10 Essential (primary) hypertension: Secondary | ICD-10-CM | POA: Diagnosis not present

## 2020-03-11 DIAGNOSIS — E785 Hyperlipidemia, unspecified: Secondary | ICD-10-CM | POA: Diagnosis not present

## 2020-03-11 MED ORDER — METOPROLOL SUCCINATE ER 25 MG PO TB24: 25.0000 mg | ORAL_TABLET | Freq: Every day | ORAL | 3 refills | Status: DC

## 2020-03-11 NOTE — Patient Instructions (Signed)
Start Toprol XL 25 mg daily for blood pressure

## 2020-03-17 ENCOUNTER — Other Ambulatory Visit: Payer: Self-pay

## 2020-03-17 ENCOUNTER — Ambulatory Visit: Payer: Medicare Other

## 2020-03-17 ENCOUNTER — Ambulatory Visit (INDEPENDENT_AMBULATORY_CARE_PROVIDER_SITE_OTHER): Payer: Medicare Other | Admitting: Dermatology

## 2020-03-17 DIAGNOSIS — L570 Actinic keratosis: Secondary | ICD-10-CM | POA: Diagnosis not present

## 2020-03-17 DIAGNOSIS — L2089 Other atopic dermatitis: Secondary | ICD-10-CM

## 2020-03-17 DIAGNOSIS — L578 Other skin changes due to chronic exposure to nonionizing radiation: Secondary | ICD-10-CM | POA: Diagnosis not present

## 2020-03-17 MED ORDER — DUPILUMAB 300 MG/2ML ~~LOC~~ SOAJ
300.0000 mg | Freq: Once | SUBCUTANEOUS | Status: AC
  Administered 2020-03-17: 300 mg via SUBCUTANEOUS

## 2020-03-17 NOTE — Progress Notes (Signed)
   Follow-Up Visit   Subjective  Patrick Rodriguez. is a 86 y.o. male who presents for the following: Follow-up (2 week atopic dermatitis follow - treating with Dupixent) and Other (Spot of left cheek near nose that gets irritated). Other rough spots he wants evaluated. Hx of AKs and skin cancer treated in past.   The following portions of the chart were reviewed this encounter and updated as appropriate: Tobacco  Allergies  Meds  Problems  Med Hx  Surg Hx  Fam Hx      Review of Systems: No other skin or systemic complaints.  Objective  Well appearing patient in no apparent distress; mood and affect are within normal limits.  A focused examination was performed including scalp, face, right upper arm. Relevant physical exam findings are noted in the Assessment and Plan.  Objective  Left Alar Crease, Right Sup Forehead, Scalp (6): Erythematous thin papules/macules with gritty scale.   Objective  Chest - Medial [facility]): No active rash today.  Assessment & Plan    Actinic Damage - diffuse scaly erythematous macules with underlying dyspigmentation - Recommend daily broad spectrum sunscreen SPF 30+ to sun-exposed areas, reapply every 2 hours as needed.  - Call for new or changing lesions.   AK (actinic keratosis) (8) Scalp (4); Left Alar Crease; Right Sup Forehead (3)  Destruction of lesion - Left Alar Crease, Right Sup Forehead Complexity: simple   Destruction method: cryotherapy   Informed consent: discussed and consent obtained   Timeout:  patient name, date of birth, surgical site, and procedure verified Lesion destroyed using liquid nitrogen: Yes   Region frozen until ice ball extended beyond lesion: Yes   Outcome: patient tolerated procedure well with no complications   Post-procedure details: wound care instructions given    Other atopic dermatitis Severe but improved on treatment. Continue Dupixent injections q2weeks. Dupixent injection given today to right  upper arm.  Return in about 2 weeks (around 03/31/2020).   I, Ashok Cordia, CMA, am acting as scribe for Sarina Ser, MD . Documentation: I have reviewed the above documentation for accuracy and completeness, and I agree with the above.  Sarina Ser, MD

## 2020-03-18 ENCOUNTER — Encounter: Payer: Self-pay | Admitting: Dermatology

## 2020-03-25 ENCOUNTER — Other Ambulatory Visit: Payer: Self-pay | Admitting: Hematology and Oncology

## 2020-03-25 DIAGNOSIS — D469 Myelodysplastic syndrome, unspecified: Secondary | ICD-10-CM

## 2020-03-25 NOTE — Progress Notes (Signed)
[place] Telephone:([phone]) [phone]   Fax:([phone]) [phone]  PROGRESS NOTE  Patient Care Team: Maury Dus, MD as PCP - General (Family Medicine) Martinique, Peter M, MD as PCP - Cardiology (Cardiology)  Hematological/Oncological History  #Myelodysplastic Syndrome/ NOS Myeloproliferative Syndrome 1)07/26/2018: WBC 5.1, Hgb 11.6, MCV 94.4, Plt 363 2) 9//2020: WBC 25.1, Hgb 9.2, Plt 1083, MCV 93.2 (in context of eczema flare) 3) 09/20/2019: Iron 128, TIBC 242 (low), Iron sat 53%, transferrin 173 4) 10/15/2019: WBC 14.3, Hgb 8.8, Plt 485, MCV 90.6. FOB negative 5) 11/07/2019: Establish care with Dr. Lorenso Courier 6) 11/26/2019: Bone marrow biopsy performed. Showed hypercellular marrow with 9% blasts, concern for MDS vs MPN. MDS panel negative for definitive mutation. WBC 29.3, Hgb 8.3, Plt 1008.  7) 12/14/2019: WBC 30.1, Hgb 7.8, Plt 1718. Received 1 units of PRBC for symptomatic anemia. BCR/ABL and JAK2 w/ reflex panels sent. Positive for U2AF1 and SRSF2 gene mutations. Neither are canonical mutations for MPN/MDS 8) 02/21/2020: received 1 unit of PRBC for Hgb 8.5 (symptomatic)  Interval History:  Patrick Rodriguez. 86 y.o. male with medical history significant for leukocytosis, thrombocytosis, and anemia presents for a follow up visit. The patient's last visit was on 12/27/19. In the interim since the last visit Patrick Rodriguez has received a blood transfusion on 02/21/2020 for Hgb of 8.5 with symptoms.   Patrick Rodriguez notes that he feels "listless" for most of the day.  He reports that his primary care provider Dr. Martinique prescribed him a beta-blocker and that he feels like this has caused him to have some lightheadedness and increase in fatigue.  He reports that he has been cutting this pill in half and will discuss it with his primary care provider at the next opportunity.  He reports that he has been sleeping about 8 to 9 hours per night, but still feels fatigued throughout the day.  He reports that he  has a strong desire to feel better and have the energy that he used to have.  He notes that he has not noticed a big difference in the way he is felt since our last visit in January.    He is not having any shortness of breath at rest, but upon exertion he notes that he does not have as much winded him as he used to.  He reports he can walk about 1/2 mile before he has to stop, noting that he does not have to sit down but simply has to catch his breath.  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  Overall he still denies having any chest pain, syncopal episodes, falls, or other physical limitations.  He denies having any overt signs of bleeding such as nosebleeds, bruising, or dark stools.  A full 10 point ROS is listed below.  MEDICAL HISTORY:  Past Medical History:  Diagnosis Date  . Allergic rhinitis   . Carotid artery stenosis    12/2011:  50-69% on the left and 1-49% on the right;  Carotid US (11/2013): Bilateral 40-59%; repeat 1 year  . Cataract   . Coronary artery disease    a.  s/p stenting of the LAD in 2000;  b. Lexiscan Myoview (12/2013): No ischemia, EF 70%; normal study  . ED (erectile dysfunction)   . GERD (gastroesophageal reflux disease)   . Hyperlipidemia   . Hyperplastic colon polyp 11/15/2013   x3  . Hypertension   . Myocardial  infarction ([place])   . Normal echocardiogram Jan 2013   EF of 55% and no wall motion abnormalities  . Normal nuclear stress test 2008  . Obesity   . Palpitations    Holter (11/2013): NSR, sinus brady, PVCs, rare blocked PAC, no sig arrhythmia  . Substance abuse ([place])   . Syncope Jan 2013   felt to be vasovagal; had normal echo, carotids ok, 1st degree AV block with mention (but no tracing) of 2nd degree type I AV block while in [place]  . Tubular adenoma 11/15/2013   x2    SURGICAL HISTORY: Past Surgical History:  Procedure Laterality Date  . APPENDECTOMY    . CARDIAC  CATHETERIZATION  01/20/1999   EF 60%  . CARDIOVASCULAR STRESS TEST  04/19/2007   EF 74%  . CORONARY STENT PLACEMENT  2000   LAD  . DOPPLER ECHOCARDIOGRAPHY  02/06/1998   EF 60%  . TONSILLECTOMY     ALLERGIES:  is allergic to hctz [hydrochlorothiazide].  MEDICATIONS:  Current Outpatient Medications  Medication Sig Dispense Refill  . diphenhydrAMINE (BENADRYL) 25 MG tablet Take 25 mg by mouth at bedtime as needed.    Marland Kitchen amLODipine (NORVASC) 10 MG tablet TAKE 1 TABLET BY MOUTH DAILY 90 tablet 3  . Ascorbic Acid (VITAMIN C) 1000 MG tablet Take 1,000 mg by mouth daily.      Marland Kitchen aspirin 81 MG tablet Take 81 mg by mouth daily.    Marland Kitchen b complex vitamins tablet Take 1 tablet by mouth daily.      . Coenzyme Q10 (CO Q-10) 100 MG CAPS Take 1 capsule by mouth every morning.    Marland Kitchen doxazosin (CARDURA) 4 MG tablet TAKE 1 TABLET BY MOUTH DAILY 90 tablet 1  . Dupilumab (DUPIXENT [place]) Inject into the skin every 14 (fourteen) days.    . DUPIXENT 300 MG/2ML SOPN     . magnesium gluconate (MAGONATE) 500 MG tablet Take 500 mg by mouth daily.      . metoprolol succinate (TOPROL XL) 25 MG 24 hr tablet Take 1 tablet (25 mg total) by mouth daily. 90 tablet 3  . Multiple Vitamins-Minerals (ZINC PO) Take by mouth.    . Nattokinase 100 MG CAPS Take by mouth daily.      . Nutritional Supplements (SALMON OIL) CAPS Take 1 capsule by mouth every morning.    Marland Kitchen omeprazole (PRILOSEC) 40 MG capsule Take 40 mg by mouth daily.    . Red Yeast Rice Extract (RED YEAST RICE PO) Take 1 tablet by mouth 3 (three) times a week.    . vitamin E 400 UNIT capsule Take 400 Units by mouth daily.       Current Facility-Administered Medications  Medication Dose Route Frequency Provider Last Rate Last Admin  . Dupilumab SOPN 300 mg  300 mg Subcutaneous Q14 Days Ralene Bathe, MD   300 mg at 03/03/20 1142    REVIEW OF SYSTEMS:   Constitutional: ( - ) fevers, ( - )  chills , ( - ) night sweats Eyes: ( - ) blurriness of vision, ( - ) double  vision, ( - ) watery eyes Ears, nose, mouth, throat, and face: ( - ) mucositis, ( - ) sore throat Respiratory: ( - ) cough, ( - ) dyspnea, ( - ) wheezes Cardiovascular: ( - ) palpitation, ( - ) chest discomfort, ( - ) lower extremity swelling Gastrointestinal:  ( - ) nausea, ( - ) heartburn, ( - ) change in bowel habits Skin: ( - )  abnormal skin rashes Lymphatics: ( - ) new lymphadenopathy, ( - ) easy bruising Neurological: ( - ) numbness, ( - ) tingling, ( - ) new weaknesses Behavioral/Psych: ( - ) mood change, ( - ) new changes  All other systems were reviewed with the patient and are negative.  PHYSICAL EXAMINATION: ECOG PERFORMANCE STATUS: 1 - Symptomatic but completely ambulatory  Vitals:   03/26/20 1111  BP: (!) 159/48  Pulse: 75  Resp: 20  Temp: 98.7 F (37.1 C)  SpO2: 100%   Filed Weights   03/26/20 1111  Weight: 161 lb 14.4 oz (73.4 kg)    GENERAL: well appearing elderly Caucasian male in NAD SKIN: skin color, texture, turgor are normal, no rashes or significant lesions EYES: conjunctiva are pink and non-injected, sclera clear LUNGS: clear to auscultation and percussion with normal breathing effort HEART: regular rate & rhythm and no murmurs and +2 bilateral lower extremity edema Musculoskeletal: no cyanosis of digits and no clubbing  PSYCH: alert & oriented x 3, fluent speech NEURO: no focal motor/sensory deficits  LABORATORY DATA:  I have reviewed the data as listed CBC Latest Ref Rng & Units 03/26/2020 02/21/2020 01/24/2020  WBC 4.0 - 10.5 K/uL 25.9(H) 19.9(H) 17.2(H)  Hemoglobin 13.0 - 17.0 g/dL 8.8(L) 8.5(L) 8.9(L)  Hematocrit 39.0 - 52.0 % 27.1(L) 26.1(L) 27.6(L)  Platelets 150 - 400 K/uL 816(H) 897(H) 777(H)    CMP Latest Ref Rng & Units 03/26/2020 01/24/2020 12/27/2019  Glucose 70 - 99 mg/dL 119(H) 138(H) 141(H)  BUN 8 - 23 mg/dL 23 25(H) 28(H)  Creatinine 0.61 - 1.24 mg/dL 1.51(H) 1.57(H) 1.25(H)  Sodium 135 - 145 mmol/L 141 138 137  Potassium 3.5 - 5.1  mmol/L 4.4 4.4 4.4  Chloride 98 - 111 mmol/L 108 107 105  CO2 22 - 32 mmol/L 23 24 25  Calcium 8.9 - 10.3 mg/dL 8.9 8.5(L) 8.9  Total Protein 6.5 - 8.1 g/dL 6.6 6.4(L) 6.3(L)  Total Bilirubin 0.3 - 1.2 mg/dL 0.4 0.3 0.4  Alkaline Phos 38 - 126 U/L 53 48 49  AST 15 - 41 U/L 19 19 21  ALT 0 - 44 U/L 13 17 20    RADIOGRAPHIC STUDIES: No results found.  ASSESSMENT & PLAN Patrick P Greener Jr. 86 y.o. male with medical history significant for leukocytosis, thrombocytosis, and anemia presents for a follow up visit. His lab findings and bone marrow biopsy are consistent with MDS vs NOS MPN.   Bone marrow biopsy performed on 11/26/2019 showed a hypercellular marrow consistent with MDS versus MPN with 9% blasts.  MDS panel was negative for definitive mutation at that time. Additional workup showed no JAK2, CALR, MPL, or BCR/ABL. He does carry 2 mutations  U2AF1 and SRSF2 which are of unclear clinical significance  We discussed that are working diagnosis at this time is MDS with some component of MPN.  We talked about treatments for MDS, however the patient noted that he would not want any chemotherapy regimens.  I did briefly note that hypomethylation therapy is not technically chemotherapy, but he declined these IV treatments at this time. On further discussion we talked about the nature of his disease and the fact that he carries 2 nonclinical mutations but no targetable mutations at this time.  We also discussed that this could be treated as an MDS with hypomethylating therapy, but he noted that that is not something that he would want to consider at this time.  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    Given his advanced age I would agree that supportive therapy with transfusions would not be an unreasonable option if this was consistent with his goals of care. We can also consider erythropoetin therapy if the frequency of blood  transfusions increases to >2 per month.   #Myelodysplastic Syndrome/ NOS Myeloproliferative Syndrome --bone marrow biopsy on 11/26/2019 showed hypercellular marrow with concern for MDS vs MPN. MDS panel negative for definitive mutation and canonical mutations such as JAK2, CALR, MPL, and BCR/ABL were negative. He does carry 2 mutations  U2AF1 and SRSF2 which are of unclear clinical significance --no indication for blood transfusion today, we will have patient return monthly for lab check CBC and possible transfusion.  -- d/c PO iron therapy.  --RTC in approximately 3 months to reassess or sooner if indicated by his blood work.   All questions were answered. The patient knows to call the clinic with any problems, questions or concerns.  A total of more than 30 minutes were spent on this encounter and over half of that time was spent on counseling and coordination of care as outlined above.   Patrick T. Dorsey, MD Department of Hematology/Oncology [facility] Phone: [phone] Pager: [phone] Email: [email or web address]  03/30/2020 4:26 PM 

## 2020-03-26 ENCOUNTER — Other Ambulatory Visit: Payer: Self-pay

## 2020-03-26 ENCOUNTER — Inpatient Hospital Stay: Payer: Medicare Other

## 2020-03-26 ENCOUNTER — Inpatient Hospital Stay: Payer: Medicare Other | Attending: Hematology and Oncology | Admitting: Hematology and Oncology

## 2020-03-26 VITALS — BP 159/48 | HR 75 | Temp 98.7°F | Resp 20 | Ht 68.0 in | Wt 161.9 lb

## 2020-03-26 DIAGNOSIS — D473 Essential (hemorrhagic) thrombocythemia: Secondary | ICD-10-CM

## 2020-03-26 DIAGNOSIS — D649 Anemia, unspecified: Secondary | ICD-10-CM

## 2020-03-26 DIAGNOSIS — D469 Myelodysplastic syndrome, unspecified: Secondary | ICD-10-CM

## 2020-03-26 DIAGNOSIS — D75839 Thrombocytosis, unspecified: Secondary | ICD-10-CM

## 2020-03-26 DIAGNOSIS — D72825 Bandemia: Secondary | ICD-10-CM

## 2020-03-26 LAB — CBC WITH DIFFERENTIAL (CANCER CENTER ONLY)
Abs Immature Granulocytes: 1.78 10*3/uL — ABNORMAL HIGH (ref 0.00–0.07)
Basophils Absolute: 0.1 10*3/uL (ref 0.0–0.1)
Basophils Relative: 0 %
Eosinophils Absolute: 1.2 10*3/uL — ABNORMAL HIGH (ref 0.0–0.5)
Eosinophils Relative: 5 %
HCT: 27.1 % — ABNORMAL LOW (ref 39.0–52.0)
Hemoglobin: 8.8 g/dL — ABNORMAL LOW (ref 13.0–17.0)
Immature Granulocytes: 7 %
Lymphocytes Relative: 6 %
Lymphs Abs: 1.5 10*3/uL (ref 0.7–4.0)
MCH: 29.7 pg (ref 26.0–34.0)
MCHC: 32.5 g/dL (ref 30.0–36.0)
MCV: 91.6 fL (ref 80.0–100.0)
Monocytes Absolute: 0.6 10*3/uL (ref 0.1–1.0)
Monocytes Relative: 2 %
Neutro Abs: 20.7 10*3/uL — ABNORMAL HIGH (ref 1.7–7.7)
Neutrophils Relative %: 80 %
Platelet Count: 816 10*3/uL — ABNORMAL HIGH (ref 150–400)
RBC: 2.96 MIL/uL — ABNORMAL LOW (ref 4.22–5.81)
RDW: 22 % — ABNORMAL HIGH (ref 11.5–15.5)
WBC Count: 25.9 10*3/uL — ABNORMAL HIGH (ref 4.0–10.5)
nRBC: 0 % (ref 0.0–0.2)

## 2020-03-26 LAB — CMP (CANCER CENTER ONLY)
ALT: 13 U/L (ref 0–44)
AST: 19 U/L (ref 15–41)
Albumin: 3.6 g/dL (ref 3.5–5.0)
Alkaline Phosphatase: 53 U/L (ref 38–126)
Anion gap: 10 (ref 5–15)
BUN: 23 mg/dL (ref 8–23)
CO2: 23 mmol/L (ref 22–32)
Calcium: 8.9 mg/dL (ref 8.9–10.3)
Chloride: 108 mmol/L (ref 98–111)
Creatinine: 1.51 mg/dL — ABNORMAL HIGH (ref 0.61–1.24)
GFR, Est AFR Am: 46 mL/min — ABNORMAL LOW (ref >60)
GFR, Estimated: 40 mL/min — ABNORMAL LOW (ref >60)
Glucose, Bld: 119 mg/dL — ABNORMAL HIGH (ref 70–99)
Potassium: 4.4 mmol/L (ref 3.5–5.1)
Sodium: 141 mmol/L (ref 135–145)
Total Bilirubin: 0.4 mg/dL (ref 0.3–1.2)
Total Protein: 6.6 g/dL (ref 6.5–8.1)

## 2020-03-26 LAB — SAVE SMEAR(SSMR), FOR PROVIDER SLIDE REVIEW

## 2020-03-30 ENCOUNTER — Encounter: Payer: Self-pay | Admitting: Hematology and Oncology

## 2020-04-04 ENCOUNTER — Other Ambulatory Visit: Payer: Self-pay

## 2020-04-04 ENCOUNTER — Ambulatory Visit: Payer: Medicare Other

## 2020-04-04 DIAGNOSIS — L209 Atopic dermatitis, unspecified: Secondary | ICD-10-CM

## 2020-04-04 MED ORDER — DUPILUMAB 300 MG/2ML ~~LOC~~ SOSY
300.0000 mg | PREFILLED_SYRINGE | Freq: Once | SUBCUTANEOUS | Status: AC
  Administered 2020-04-04: 300 mg via SUBCUTANEOUS

## 2020-04-04 NOTE — Progress Notes (Signed)
Patient here for Dupixent injection.   Dupixent 300mg /3mL injected into left upper arm. Patient tolerated well.  Lot: [id number] EXP: 12/2021

## 2020-04-11 ENCOUNTER — Telehealth: Payer: Self-pay | Admitting: Hematology and Oncology

## 2020-04-11 NOTE — Telephone Encounter (Signed)
Scheduled appt per 5/4 shc message - unable to reach pt . Left message for patient with appt date and time. Next appt 5/18

## 2020-04-22 ENCOUNTER — Other Ambulatory Visit: Payer: Self-pay | Admitting: *Deleted

## 2020-04-22 ENCOUNTER — Other Ambulatory Visit: Payer: Self-pay

## 2020-04-22 ENCOUNTER — Inpatient Hospital Stay: Payer: Medicare Other

## 2020-04-22 ENCOUNTER — Inpatient Hospital Stay: Payer: Medicare Other | Attending: Hematology and Oncology

## 2020-04-22 ENCOUNTER — Telehealth: Payer: Self-pay

## 2020-04-22 DIAGNOSIS — D469 Myelodysplastic syndrome, unspecified: Secondary | ICD-10-CM

## 2020-04-22 DIAGNOSIS — Z95828 Presence of other vascular implants and grafts: Secondary | ICD-10-CM

## 2020-04-22 LAB — CBC WITH DIFFERENTIAL (CANCER CENTER ONLY)
Abs Immature Granulocytes: 4.42 10*3/uL — ABNORMAL HIGH (ref 0.00–0.07)
Basophils Absolute: 0.2 10*3/uL — ABNORMAL HIGH (ref 0.0–0.1)
Basophils Relative: 1 %
Eosinophils Absolute: 1.1 10*3/uL — ABNORMAL HIGH (ref 0.0–0.5)
Eosinophils Relative: 3 %
HCT: 24.8 % — ABNORMAL LOW (ref 39.0–52.0)
Hemoglobin: 8 g/dL — ABNORMAL LOW (ref 13.0–17.0)
Immature Granulocytes: 12 %
Lymphocytes Relative: 8 %
Lymphs Abs: 2.9 10*3/uL (ref 0.7–4.0)
MCH: 30.2 pg (ref 26.0–34.0)
MCHC: 32.3 g/dL (ref 30.0–36.0)
MCV: 93.6 fL (ref 80.0–100.0)
Monocytes Absolute: 0.4 10*3/uL (ref 0.1–1.0)
Monocytes Relative: 1 %
Neutro Abs: 26.5 10*3/uL — ABNORMAL HIGH (ref 1.7–7.7)
Neutrophils Relative %: 75 %
Platelet Count: 1000 10*3/uL (ref 150–400)
RBC: 2.65 MIL/uL — ABNORMAL LOW (ref 4.22–5.81)
RDW: 22.5 % — ABNORMAL HIGH (ref 11.5–15.5)
WBC Count: 35.5 10*3/uL — ABNORMAL HIGH (ref 4.0–10.5)
nRBC: 0.1 % (ref 0.0–0.2)

## 2020-04-22 LAB — SAMPLE TO BLOOD BANK

## 2020-04-22 LAB — TYPE AND SCREEN
ABO/RH(D): A POS
Antibody Screen: NEGATIVE

## 2020-04-22 NOTE — Telephone Encounter (Addendum)
CRITICAL VALUE STICKER  CRITICAL VALUE: Platelets  RECEIVER (on-site recipient of call): Lenox Ponds LPN  DATE & TIME NOTIFIED: 04/22/20 1317  MESSENGER (representative from lab): Donnie Aho   MD NOTIFIED: Dr. Julien Nordmann  TIME OF NOTIFICATION: 1320  RESPONSE: Pt will FU with Dr Lorenso Courier

## 2020-04-23 ENCOUNTER — Other Ambulatory Visit: Payer: Self-pay | Admitting: *Deleted

## 2020-04-23 ENCOUNTER — Telehealth: Payer: Self-pay | Admitting: Hematology and Oncology

## 2020-04-23 ENCOUNTER — Ambulatory Visit: Payer: Medicare Other | Admitting: Dermatology

## 2020-04-23 DIAGNOSIS — L578 Other skin changes due to chronic exposure to nonionizing radiation: Secondary | ICD-10-CM | POA: Diagnosis not present

## 2020-04-23 DIAGNOSIS — L820 Inflamed seborrheic keratosis: Secondary | ICD-10-CM

## 2020-04-23 DIAGNOSIS — D692 Other nonthrombocytopenic purpura: Secondary | ICD-10-CM | POA: Diagnosis not present

## 2020-04-23 DIAGNOSIS — L853 Xerosis cutis: Secondary | ICD-10-CM | POA: Diagnosis not present

## 2020-04-23 DIAGNOSIS — L2089 Other atopic dermatitis: Secondary | ICD-10-CM | POA: Diagnosis not present

## 2020-04-23 DIAGNOSIS — L570 Actinic keratosis: Secondary | ICD-10-CM

## 2020-04-23 DIAGNOSIS — D469 Myelodysplastic syndrome, unspecified: Secondary | ICD-10-CM

## 2020-04-23 MED ORDER — DUPILUMAB 300 MG/2ML ~~LOC~~ SOAJ
300.0000 mg | Freq: Once | SUBCUTANEOUS | Status: AC
  Administered 2020-04-23: 300 mg via SUBCUTANEOUS

## 2020-04-23 NOTE — Progress Notes (Signed)
   Follow-Up Visit   Subjective  Patrick Rodriguez. is a 86 y.o. male who presents for the following: Atopic dermatitis (2 weeks f/u body- pt here for Dupixent injection ). He has some new rough spots on his scalp.  The following portions of the chart were reviewed this encounter and updated as appropriate:  Tobacco  Allergies  Meds  Problems  Med Hx  Surg Hx  Fam Hx      Review of Systems:  No other skin or systemic complaints except as noted in HPI or Assessment and Plan.  Objective  Well appearing patient in no apparent distress; mood and affect are within normal limits.  A focused examination was performed including exts,trunk, face, scalp . Relevant physical exam findings are noted in the Assessment and Plan.  Objective  scalp (6): Erythematous thin papules/macules with gritty scale.   Objective  Head - Anterior (Face): Diffuse scaly erythematous macules with underlying dyspigmentation.   Objective  Mid Back (3): Erythematous keratotic or waxy stuck-on papule or plaque.   Objective  trunk, exts: Skin appears mostly clear   Objective  arms: Violaceous macules and patches.   Objective  body: Dry    Assessment & Plan  AK (actinic keratosis) (6) scalp  Destruction of lesion - scalp Complexity: simple   Destruction method: cryotherapy   Informed consent: discussed and consent obtained   Timeout:  patient name, date of birth, surgical site, and procedure verified Lesion destroyed using liquid nitrogen: Yes   Region frozen until ice ball extended beyond lesion: Yes   Outcome: patient tolerated procedure well with no complications   Post-procedure details: wound care instructions given    Actinic skin damage Head - Anterior (Face)  Observe  Recommend daily broad spectrum sunscreen SPF 30+ to sun-exposed areas, reapply every 2 hours as needed. Call for new or changing lesions.   Inflamed seborrheic keratosis (3) Mid Back  Destruction of lesion - Mid  Back Complexity: simple   Destruction method: cryotherapy   Informed consent: discussed and consent obtained   Timeout:  patient name, date of birth, surgical site, and procedure verified Lesion destroyed using liquid nitrogen: Yes   Region frozen until ice ball extended beyond lesion: Yes   Outcome: patient tolerated procedure well with no complications   Post-procedure details: wound care instructions given    Other atopic dermatitis Severe atopic dermatitis with severe pruritus better controlled on Dupixent but still persistent  trunk, exts  Better controlled on Dupixent injection  Better controlled on Antihistamines   Other nonthrombocytopenic purpura (HCC) arms  Extensive Purpura likely associated with the patient's aplastic anemia and scratching and rubbing  Xerosis cutis body  Use good moisturizers daily   Return in about 2 weeks (around 05/07/2020) for nurse visit, 4 weeks Dr Sherri Sear, CMA, am acting as scribe for Sarina Ser, MD .  Documentation: I have reviewed the above documentation for accuracy and completeness, and I agree with the above.  Sarina Ser, MD

## 2020-04-23 NOTE — Telephone Encounter (Signed)
Scheduled apt per 5/19 sch message - pt is aware of appt date and time

## 2020-04-24 ENCOUNTER — Inpatient Hospital Stay: Payer: Medicare Other

## 2020-04-24 ENCOUNTER — Other Ambulatory Visit: Payer: Self-pay | Admitting: *Deleted

## 2020-04-24 ENCOUNTER — Other Ambulatory Visit: Payer: Self-pay

## 2020-04-24 DIAGNOSIS — D469 Myelodysplastic syndrome, unspecified: Secondary | ICD-10-CM | POA: Diagnosis not present

## 2020-04-24 LAB — CBC WITH DIFFERENTIAL (CANCER CENTER ONLY)
Abs Immature Granulocytes: 4.49 10*3/uL — ABNORMAL HIGH (ref 0.00–0.07)
Basophils Absolute: 0.2 10*3/uL — ABNORMAL HIGH (ref 0.0–0.1)
Basophils Relative: 1 %
Eosinophils Absolute: 1 10*3/uL — ABNORMAL HIGH (ref 0.0–0.5)
Eosinophils Relative: 3 %
HCT: 25.1 % — ABNORMAL LOW (ref 39.0–52.0)
Hemoglobin: 8.2 g/dL — ABNORMAL LOW (ref 13.0–17.0)
Immature Granulocytes: 13 %
Lymphocytes Relative: 6 %
Lymphs Abs: 2.2 10*3/uL (ref 0.7–4.0)
MCH: 30.3 pg (ref 26.0–34.0)
MCHC: 32.7 g/dL (ref 30.0–36.0)
MCV: 92.6 fL (ref 80.0–100.0)
Monocytes Absolute: 1.3 10*3/uL — ABNORMAL HIGH (ref 0.1–1.0)
Monocytes Relative: 4 %
Neutro Abs: 25.3 10*3/uL — ABNORMAL HIGH (ref 1.7–7.7)
Neutrophils Relative %: 73 %
Platelet Count: 1038 10*3/uL (ref 150–400)
RBC: 2.71 MIL/uL — ABNORMAL LOW (ref 4.22–5.81)
RDW: 22.3 % — ABNORMAL HIGH (ref 11.5–15.5)
WBC Count: 34.4 10*3/uL — ABNORMAL HIGH (ref 4.0–10.5)
nRBC: 0.1 % (ref 0.0–0.2)

## 2020-04-24 LAB — PREPARE RBC (CROSSMATCH)

## 2020-04-24 MED ORDER — SODIUM CHLORIDE 0.9% FLUSH
3.0000 mL | INTRAVENOUS | Status: DC | PRN
  Filled 2020-04-24: qty 10

## 2020-04-24 MED ORDER — ACETAMINOPHEN 325 MG PO TABS
650.0000 mg | ORAL_TABLET | Freq: Once | ORAL | Status: AC
  Administered 2020-04-24: 650 mg via ORAL

## 2020-04-24 MED ORDER — SODIUM CHLORIDE 0.9% IV SOLUTION
250.0000 mL | Freq: Once | INTRAVENOUS | Status: DC
  Filled 2020-04-24: qty 250

## 2020-04-24 MED ORDER — ACETAMINOPHEN 325 MG PO TABS
ORAL_TABLET | ORAL | Status: AC
  Filled 2020-04-24: qty 2

## 2020-04-24 NOTE — Patient Instructions (Signed)

## 2020-04-25 LAB — TYPE AND SCREEN
ABO/RH(D): A POS
Antibody Screen: NEGATIVE
Unit division: 0

## 2020-04-25 LAB — BPAM RBC
Blood Product Expiration Date: 202106042359
ISSUE DATE / TIME: 202105201421
Unit Type and Rh: 6200

## 2020-04-27 ENCOUNTER — Encounter: Payer: Self-pay | Admitting: Dermatology

## 2020-04-28 ENCOUNTER — Other Ambulatory Visit: Payer: Self-pay | Admitting: Pharmacist

## 2020-04-28 ENCOUNTER — Telehealth: Payer: Self-pay | Admitting: *Deleted

## 2020-04-28 ENCOUNTER — Other Ambulatory Visit: Payer: Self-pay | Admitting: Hematology and Oncology

## 2020-04-28 ENCOUNTER — Inpatient Hospital Stay: Payer: Medicare Other

## 2020-04-28 ENCOUNTER — Other Ambulatory Visit: Payer: Self-pay | Admitting: *Deleted

## 2020-04-28 ENCOUNTER — Other Ambulatory Visit: Payer: Self-pay

## 2020-04-28 ENCOUNTER — Inpatient Hospital Stay (HOSPITAL_BASED_OUTPATIENT_CLINIC_OR_DEPARTMENT_OTHER): Payer: Medicare Other | Admitting: Medical

## 2020-04-28 VITALS — BP 163/56 | HR 92 | Temp 97.9°F | Resp 18 | Ht 68.0 in | Wt 155.1 lb

## 2020-04-28 DIAGNOSIS — D469 Myelodysplastic syndrome, unspecified: Secondary | ICD-10-CM

## 2020-04-28 DIAGNOSIS — K068 Other specified disorders of gingiva and edentulous alveolar ridge: Secondary | ICD-10-CM | POA: Diagnosis not present

## 2020-04-28 LAB — CBC WITH DIFFERENTIAL (CANCER CENTER ONLY)
Abs Immature Granulocytes: 4.67 10*3/uL — ABNORMAL HIGH (ref 0.00–0.07)
Basophils Absolute: 0.1 10*3/uL (ref 0.0–0.1)
Basophils Relative: 0 %
Eosinophils Absolute: 1.1 10*3/uL — ABNORMAL HIGH (ref 0.0–0.5)
Eosinophils Relative: 3 %
HCT: 26.2 % — ABNORMAL LOW (ref 39.0–52.0)
Hemoglobin: 8.5 g/dL — ABNORMAL LOW (ref 13.0–17.0)
Immature Granulocytes: 14 %
Lymphocytes Relative: 6 %
Lymphs Abs: 2.1 10*3/uL (ref 0.7–4.0)
MCH: 29.3 pg (ref 26.0–34.0)
MCHC: 32.4 g/dL (ref 30.0–36.0)
MCV: 90.3 fL (ref 80.0–100.0)
Monocytes Absolute: 1.3 10*3/uL — ABNORMAL HIGH (ref 0.1–1.0)
Monocytes Relative: 4 %
Neutro Abs: 24.6 10*3/uL — ABNORMAL HIGH (ref 1.7–7.7)
Neutrophils Relative %: 73 %
Platelet Count: 999 10*3/uL (ref 150–400)
RBC: 2.9 MIL/uL — ABNORMAL LOW (ref 4.22–5.81)
RDW: 21 % — ABNORMAL HIGH (ref 11.5–15.5)
WBC Count: 33.9 10*3/uL — ABNORMAL HIGH (ref 4.0–10.5)
nRBC: 0 % (ref 0.0–0.2)

## 2020-04-28 LAB — SAMPLE TO BLOOD BANK

## 2020-04-28 LAB — SAVE SMEAR(SSMR), FOR PROVIDER SLIDE REVIEW

## 2020-04-28 MED ORDER — AMINOCAPROIC ACID SOLUTION 5% (50 MG/ML): 10.0000 mL | Freq: Four times a day (QID) | ORAL | 0 refills | Status: DC

## 2020-04-28 MED ORDER — TRANEXAMIC ACID 650 MG PO TABS: 650.0000 mg | ORAL_TABLET | Freq: Four times a day (QID) | ORAL | 0 refills | Status: DC

## 2020-04-28 MED FILL — TRANEXAMIC ACID 650 MG TAB: 650 | 5 days supply | Qty: 20 | Fill #0

## 2020-04-28 NOTE — Progress Notes (Signed)
Advised Dr. Lorenso Courier re: gingival bleeding Provided a dental preparation of tranexamic acid 4.8% In [place], available as 650 mg tabs (~$5/tab).  Also pt should be advised to continue to hold ASA along w/ Salmon oil, Red Yeast & Vit E which can all increase bleeding risk.  Kennith Center, Pharm.D., CPP 04/28/2020@4 :27 PM

## 2020-04-28 NOTE — Patient Instructions (Signed)

## 2020-04-28 NOTE — Telephone Encounter (Signed)
Received call from patient this morning stating that his upper gums have been bleeding for 4 days and that it has not stopped. He is asking what he should do. Discussed with Dr. Lorenso Courier. Pt can be seen in [facility] today with labs prior to visit.  Pt is agreeable to this plan. And is on his way here. Also asked pt to stop his daily Aspirin 81 mg.  Pt states he stopped it yesterday.  Sandi Mealy, PA aware of this pt coming in this morning. High priority scheduling message sent Lab orders placed including sample to blood bank/peripheral smear for Dr. Lorenso Courier and CBC

## 2020-04-28 NOTE — Telephone Encounter (Signed)
[name] patient regarding medication to stop the bleeding in his mouth.Spoke with patient and advised that Dr. Lorenso Courier was able to obtain Tranexamic Acid 650 mg tablets @ [facility]. The cash cost is $109. Pt had called earlier in the day that the Amicar Solution was $1100 at [facility] and was not covered by his insurance.  Educated the patient on this medication: Dissolve 1 tablet in 10-15ml water, allow 3-5 minutes for it to dissolve. Then swish in mouth for 2 minutes, then spit completely out. He is to do this every 6 hours until bleeding in mouth stops, and then for an additional 24 hours.  Pt is to also stop the following meds: Red Yeast Rice extract, Vitamin E and salmon Oil.  Pt voiced understanding to all of the above. and is leaving to go to the pharmacy now.  Advised they are open until 6pm.

## 2020-04-30 NOTE — Progress Notes (Signed)
[facility] Progress Note   Patrick Rodriguez [id number] 09-11-1925 86 y.o.  Patrick Rodriguez. is managed by Dr. Ledell Peoples  Actively treated with chemotherapy/immunotherapy/hormonal therapy: not applicable  Next scheduled appointment with provider: 06/18/2020  Assessment: Plan:    Gingival bleeding  Myelodysplastic syndrome ([place])   Gingival bleeding: His CBC returned with a hemoglobin of 8.5, hematocrit 26.2, and a platelet count at 999.  The patient was seen with Dr. Narda Rutherford and was given a prescription for Amicar 25% solution with instructions to swish and spit 4 mL every hour as needed for gingival bleeding.  Myelodysplastic syndrome: Patrick Rodriguez continues to be followed by Dr. Lorenso Courier.  His CBC returned today with a WBC of 33.9, hemoglobin of 8.5, hematocrit 26.2, and a platelet count at 999.  Patrick Rodriguez is on no treatment per his request.  He is scheduled to see Dr. Lorenso Courier in follow-up on 06/18/2020.  Please see After Visit Summary for patient specific instructions.  Future Appointments  Date Time Provider [place]  05/07/2020 11:00 AM [facility]SE ROOM [facility] None  05/21/2020 10:00 AM [facility] LAB 4 [facility] None  05/21/2020 11:00 AM [facility] INFUSION [facility] None  06/18/2020 10:00 AM [facility] LAB 4 [facility] None  06/18/2020 10:30 AM Orson Slick, MD [facility] None  06/18/2020 11:45 AM [facility] INFUSION [facility] None    No orders of the defined types were placed in this encounter.      Subjective:   Patient ID:  Patrick Rodriguez. is a 85 y.o. (DOB 1925/10/16) male.  Chief Complaint:  Chief Complaint  Patient presents with  . Bleeding/Bruising    Oral    HPI Patrick Thu. is a 86 y.o. male with a diagnosis of myelodysplastic syndrome who is followed by Dr. Narda Rutherford.  He was last seen by Dr. Lorenso Courier on 03/26/2020.  He has elected not to receive therapy for his myelodysplasia.  He presents today with bleeding in  his gums over the last several days.  He reports that the bleeding started last Thursday after he received a unit of packed red blood cells.  The bleeding in his gums intermittently stops after drinking coffee but then recurs.  He has some stable dyspnea on exertion.  He denies any changes in activity or trauma.  He denies dizziness, weakness, or pain.   Medications: I have reviewed the patient's current medications.  Allergies:  Allergies  Allergen Reactions  . Hctz [Hydrochlorothiazide]     Has had hyponatremia    Past Medical History:  Diagnosis Date  . Allergic rhinitis   . Carotid artery stenosis    12/2011:  50-69% on the left and 1-49% on the right;  Carotid US (11/2013): Bilateral 40-59%; repeat 1 year  . Cataract   . Coronary artery disease    a.  s/p stenting of the LAD in 2000;  b. Lexiscan Myoview (12/2013): No ischemia, EF 70%; normal study  . ED (erectile dysfunction)   . GERD (gastroesophageal reflux disease)   . Hyperlipidemia   . Hyperplastic colon polyp 11/15/2013   x3  . Hypertension   . Myocardial infarction ([place])   . Normal echocardiogram Jan 2013   EF of 55% and no wall motion abnormalities  . Normal nuclear stress test 2008  . Obesity   . Palpitations    Holter (11/2013): NSR, sinus brady, PVCs, rare blocked PAC, no sig arrhythmia  . Substance abuse ([place])   . Syncope Jan  2013   felt to be vasovagal; had normal echo, carotids ok, 1st degree AV block with mention (but no tracing) of 2nd degree type I AV block while in [place]  . Tubular adenoma 11/15/2013   x2    Past Surgical History:  Procedure Laterality Date  . APPENDECTOMY    . CARDIAC CATHETERIZATION  01/20/1999   EF 60%  . CARDIOVASCULAR STRESS TEST  04/19/2007   EF 74%  . CORONARY STENT PLACEMENT  2000   LAD  . DOPPLER ECHOCARDIOGRAPHY  02/06/1998   EF 60%  . TONSILLECTOMY      Family History  Problem Relation Age of Onset  . Aneurysm Mother   . Stomach cancer Father   . Cancer Father   .  Cancer Sister     Social History   Socioeconomic History  . Marital status: Married    Spouse name: Not on file  . Number of children: Y  . Years of education: Not on file  . Highest education level: Not on file  Occupational History  . Occupation: traveling paster  Tobacco Use  . Smoking status: Former Smoker    Packs/day: 2.00    Years: 28.00    Pack years: 56.00    Types: Cigarettes    Quit date: 12/06/1968    Years since quitting: 51.4  . Smokeless tobacco: Never Used  Substance and Sexual Activity  . Alcohol use: No    Alcohol/week: 0.0 standard drinks  . Drug use: No  . Sexual activity: Not on file  Other Topics Concern  . Not on file  Social History Narrative  . Not on file   Social Determinants of Health   Financial Resource Strain:   . Difficulty of Paying Living Expenses:   Food Insecurity:   . Worried About Charity fundraiser in the Last Year:   . Arboriculturist in the Last Year:   Transportation Needs:   . Film/video editor (Medical):   Marland Kitchen Lack of Transportation (Non-Medical):   Physical Activity:   . Days of Exercise per Week:   . Minutes of Exercise per Session:   Stress:   . Feeling of Stress :   Social Connections:   . Frequency of Communication with Friends and Family:   . Frequency of Social Gatherings with Friends and Family:   . Attends Religious Services:   . Active Member of Clubs or Organizations:   . Attends Archivist Meetings:   Marland Kitchen Marital Status:   Intimate Partner Violence:   . Fear of Current or Ex-Partner:   . Emotionally Abused:   Marland Kitchen Physically Abused:   . Sexually Abused:     Past Medical History, Surgical history, Social history, and Family history were reviewed and updated as appropriate.   Please see review of systems for further details on the patient's review from today.   Review of Systems:  Review of Systems  Constitutional: Negative for chills, diaphoresis and fever.  HENT: Negative for trouble  swallowing and voice change.        Gingival bleeding  Respiratory: Positive for shortness of breath. Negative for cough, chest tightness and wheezing.   Cardiovascular: Negative for chest pain and palpitations.  Gastrointestinal: Negative for abdominal pain, constipation, diarrhea, nausea and vomiting.  Musculoskeletal: Negative for back pain and myalgias.  Neurological: Negative for dizziness, light-headedness and headaches.    Objective:   Physical Exam:  BP (!) 163/56 (BP Location: Left Arm, Patient Position: Sitting)  Pulse 92   Temp 97.9 F (36.6 C) (Temporal)   Resp 18   Ht 5\' 8"  (1.727 m)   Wt 70.4 kg (155 lb 1.6 oz)   SpO2 99%   BMI 23.58 kg/m  ECOG: 0  Physical Exam Constitutional:      General: He is not in acute distress.    Appearance: Normal appearance. He is not ill-appearing or diaphoretic.  HENT:     Head: Normocephalic and atraumatic.     Mouth/Throat:     Mouth: Mucous membranes are moist.     Comments: Bleeding noted and the upper lateral back gingiva.  No trauma is noted. Neurological:     Mental Status: He is alert.     Coordination: Coordination normal.     Gait: Gait normal.  Psychiatric:        Mood and Affect: Mood normal.        Behavior: Behavior normal.        Thought Content: Thought content normal.        Judgment: Judgment normal.     Lab Review:     Component Value Date/Time   NA 141 03/26/2020 1050   K 4.4 03/26/2020 1050   CL 108 03/26/2020 1050   CO2 23 03/26/2020 1050   GLUCOSE 119 (H) 03/26/2020 1050   BUN 23 03/26/2020 1050   CREATININE 1.51 (H) 03/26/2020 1050   CREATININE 1.15 (H) 12/09/2015 1144   CALCIUM 8.9 03/26/2020 1050   PROT 6.6 03/26/2020 1050   ALBUMIN 3.6 03/26/2020 1050   AST 19 03/26/2020 1050   ALT 13 03/26/2020 1050   ALKPHOS 53 03/26/2020 1050   BILITOT 0.4 03/26/2020 1050   GFRNONAA 40 (L) 03/26/2020 1050   GFRNONAA 60 06/19/2015 1119   GFRAA 46 (L) 03/26/2020 1050   GFRAA 70 06/19/2015 1119         Component Value Date/Time   WBC 33.9 (H) 04/28/2020 1003   WBC 13.9 (H) 12/18/2015 1531   RBC 2.90 (L) 04/28/2020 1003   HGB 8.5 (L) 04/28/2020 1003   HCT 26.2 (L) 04/28/2020 1003   PLT 999 (HH) 04/28/2020 1003   MCV 90.3 04/28/2020 1003   MCV 88.9 12/09/2015 1152   MCH 29.3 04/28/2020 1003   MCHC 32.4 04/28/2020 1003   RDW 21.0 (H) 04/28/2020 1003   LYMPHSABS 2.1 04/28/2020 1003   MONOABS 1.3 (H) 04/28/2020 1003   EOSABS 1.1 (H) 04/28/2020 1003   BASOSABS 0.1 04/28/2020 1003   -------------------------------  Imaging from last 24 hours (if applicable):  Radiology interpretation: No results found.      The patient was seen and examined with Dr. Narda Rutherford.

## 2020-05-01 ENCOUNTER — Telehealth: Payer: Self-pay | Admitting: *Deleted

## 2020-05-01 NOTE — Telephone Encounter (Signed)
[name] patient regarding recent bleeding from his gums. Pt was started on tranexamic oral solution to control bleeding. Spoke with patient and he states that the bleeding stopped within the 1st 24 hours of using the solution. No bleeding since then.  He states he feels well at this time. Encouraged to call back with any further bleeding.  He is aware of his upcoming appts.

## 2020-05-07 ENCOUNTER — Other Ambulatory Visit: Payer: Self-pay

## 2020-05-07 ENCOUNTER — Ambulatory Visit (INDEPENDENT_AMBULATORY_CARE_PROVIDER_SITE_OTHER): Payer: Medicare Other

## 2020-05-07 DIAGNOSIS — L209 Atopic dermatitis, unspecified: Secondary | ICD-10-CM | POA: Diagnosis not present

## 2020-05-07 MED ORDER — DUPILUMAB 300 MG/2ML ~~LOC~~ SOSY
300.0000 mg | PREFILLED_SYRINGE | Freq: Once | SUBCUTANEOUS | Status: AC
  Administered 2020-05-07: 300 mg via SUBCUTANEOUS

## 2020-05-07 NOTE — Progress Notes (Signed)
Patient here today for 2 week Dupixent injection.   Injected 300mg /23mL into the left arm. Patient tolerated well.   Lot: [id number] EXP: 12/2021

## 2020-05-19 ENCOUNTER — Other Ambulatory Visit: Payer: Self-pay

## 2020-05-19 ENCOUNTER — Inpatient Hospital Stay: Payer: Medicare Other | Attending: Hematology and Oncology

## 2020-05-19 ENCOUNTER — Inpatient Hospital Stay: Payer: Medicare Other

## 2020-05-19 DIAGNOSIS — D649 Anemia, unspecified: Secondary | ICD-10-CM

## 2020-05-19 DIAGNOSIS — D469 Myelodysplastic syndrome, unspecified: Secondary | ICD-10-CM

## 2020-05-19 LAB — CBC WITH DIFFERENTIAL (CANCER CENTER ONLY)
Abs Immature Granulocytes: 3.96 10*3/uL — ABNORMAL HIGH (ref 0.00–0.07)
Basophils Absolute: 0.1 10*3/uL (ref 0.0–0.1)
Basophils Relative: 0 %
Eosinophils Absolute: 1 10*3/uL — ABNORMAL HIGH (ref 0.0–0.5)
Eosinophils Relative: 3 %
HCT: 25.1 % — ABNORMAL LOW (ref 39.0–52.0)
Hemoglobin: 8 g/dL — ABNORMAL LOW (ref 13.0–17.0)
Immature Granulocytes: 12 %
Lymphocytes Relative: 9 %
Lymphs Abs: 3 10*3/uL (ref 0.7–4.0)
MCH: 30.1 pg (ref 26.0–34.0)
MCHC: 31.9 g/dL (ref 30.0–36.0)
MCV: 94.4 fL (ref 80.0–100.0)
Monocytes Absolute: 0.3 10*3/uL (ref 0.1–1.0)
Monocytes Relative: 1 %
Neutro Abs: 24.1 10*3/uL — ABNORMAL HIGH (ref 1.7–7.7)
Neutrophils Relative %: 74 %
Platelet Count: 870 10*3/uL — ABNORMAL HIGH (ref 150–400)
RBC: 2.66 MIL/uL — ABNORMAL LOW (ref 4.22–5.81)
RDW: 21.4 % — ABNORMAL HIGH (ref 11.5–15.5)
WBC Count: 32.4 10*3/uL — ABNORMAL HIGH (ref 4.0–10.5)
nRBC: 0.1 % (ref 0.0–0.2)

## 2020-05-19 LAB — SAMPLE TO BLOOD BANK

## 2020-05-19 LAB — PREPARE RBC (CROSSMATCH)

## 2020-05-19 MED ORDER — SODIUM CHLORIDE 0.9% IV SOLUTION
250.0000 mL | Freq: Once | INTRAVENOUS | Status: AC
  Administered 2020-05-19: 250 mL via INTRAVENOUS
  Filled 2020-05-19: qty 250

## 2020-05-19 NOTE — Patient Instructions (Signed)

## 2020-05-20 LAB — TYPE AND SCREEN
ABO/RH(D): A POS
Antibody Screen: NEGATIVE
Unit division: 0

## 2020-05-20 LAB — BPAM RBC
Blood Product Expiration Date: 202106292359
ISSUE DATE / TIME: 202106141437
Unit Type and Rh: 6200

## 2020-05-21 ENCOUNTER — Other Ambulatory Visit: Payer: Self-pay

## 2020-05-21 ENCOUNTER — Ambulatory Visit: Payer: Medicare Other | Admitting: Dermatology

## 2020-05-21 ENCOUNTER — Other Ambulatory Visit: Payer: Medicare Other

## 2020-05-21 DIAGNOSIS — L570 Actinic keratosis: Secondary | ICD-10-CM

## 2020-05-21 DIAGNOSIS — L209 Atopic dermatitis, unspecified: Secondary | ICD-10-CM

## 2020-05-21 DIAGNOSIS — Z1283 Encounter for screening for malignant neoplasm of skin: Secondary | ICD-10-CM | POA: Diagnosis not present

## 2020-05-21 DIAGNOSIS — Z85828 Personal history of other malignant neoplasm of skin: Secondary | ICD-10-CM

## 2020-05-21 DIAGNOSIS — D692 Other nonthrombocytopenic purpura: Secondary | ICD-10-CM | POA: Diagnosis not present

## 2020-05-21 DIAGNOSIS — L578 Other skin changes due to chronic exposure to nonionizing radiation: Secondary | ICD-10-CM

## 2020-05-21 DIAGNOSIS — Z862 Personal history of diseases of the blood and blood-forming organs and certain disorders involving the immune mechanism: Secondary | ICD-10-CM

## 2020-05-21 DIAGNOSIS — L299 Pruritus, unspecified: Secondary | ICD-10-CM

## 2020-05-21 NOTE — Progress Notes (Signed)
   Follow-Up Visit   Subjective  Patrick Rodriguez. is a 86 y.o. male who presents for the following: Dermatitis (Atopic derm severe, Dupixent 300mg /54ml sq injections q 2wks, much improved per pt no itching).   The following portions of the chart were reviewed this encounter and updated as appropriate:  Tobacco  Allergies  Meds  Problems  Med Hx  Surg Hx  Fam Hx      Review of Systems:  No other skin or systemic complaints except as noted in HPI or Assessment and Plan.  Objective  Well appearing patient in no apparent distress; mood and affect are within normal limits.  A focused examination was performed including face, scalp, trunk, arms. Relevant physical exam findings are noted in the Assessment and Plan.  Objective  trunk, extremities: Skin clear today  Objective  Left Preauricular Area: Well healed scar with no evidence of recurrence.   Objective  Left Temple x 1: Pink scaly macules   Objective  Right lateral elbow: Well healed scar with no evidence of recurrence, no lymphadenopathy.    Assessment & Plan    Purpura 2ndary to Myelodysplastic Syndrome and pruritis/itching - Violaceous macules and patches - Benign - Related to age, sun damage and/or use of blood thinners - Observe - Can use OTC arnica containing moisturizer such as Dermend Bruise Formula if desired - Call for worsening or other concerns  Actinic Damage - diffuse scaly erythematous macules with underlying dyspigmentation - Recommend daily broad spectrum sunscreen SPF 30+ to sun-exposed areas, reapply every 2 hours as needed.  - Call for new or changing lesions.  Skin cancer screening performed today.  Hx of Myelodysplastic Syndrome - Reviewed notes from Dr. Narda Rutherford 03/26/20  Atopic dermatitis, severe - persistent, but much better controlled on Dupixent.  He is best I've ever seen him today. trunk, extremities  Severe, improved on Dupixent  Cont Dupixent 300mg /37ml  sq injections  q 2wks Cont Cerave cream qd  Dupixent 300mg /67ml sq injection today to R upper arm, Lot# [id number] exp 12/2021  Other Related Medications Dupilumab SOPN 300 mg  History of basal cell carcinoma (BCC) Left Preauricular Area  Clear. Observe for recurrence. Call clinic for new or changing lesions.  Recommend regular skin exams, daily broad-spectrum spf 30+ sunscreen use, and photoprotection.     AK (actinic keratosis) Left Temple x 1  Destruction of lesion - Left Temple x 1 Complexity: simple   Destruction method: cryotherapy   Informed consent: discussed and consent obtained   Timeout:  patient name, date of birth, surgical site, and procedure verified Lesion destroyed using liquid nitrogen: Yes   Region frozen until ice ball extended beyond lesion: Yes   Outcome: patient tolerated procedure well with no complications   Post-procedure details: wound care instructions given    History of SCC (squamous cell carcinoma) of skin Right lateral elbow  Clear. Observe for recurrence. Call clinic for new or changing lesions.  Recommend regular skin exams, daily broad-spectrum spf 30+ sunscreen use, and photoprotection.     Return in about 2 weeks (around 06/04/2020) for 2 wks nurse visit, 4wks Dr. Raliegh Ip Atopic Derm.  I, Othelia Pulling, RMA, am acting as scribe for Sarina Ser, MD .  Documentation: I have reviewed the above documentation for accuracy and completeness, and I agree with the above.  Sarina Ser, MD

## 2020-05-22 ENCOUNTER — Encounter: Payer: Self-pay | Admitting: Dermatology

## 2020-06-04 ENCOUNTER — Other Ambulatory Visit: Payer: Self-pay

## 2020-06-04 ENCOUNTER — Ambulatory Visit (INDEPENDENT_AMBULATORY_CARE_PROVIDER_SITE_OTHER): Payer: Medicare Other

## 2020-06-04 DIAGNOSIS — L209 Atopic dermatitis, unspecified: Secondary | ICD-10-CM

## 2020-06-04 NOTE — Progress Notes (Signed)
Patient came in today for his 2 week Dupixent injection. Patient has been approved and start receiving free medication through [facility].   He wants to be trained to do injections at home every 2 weeks.   Patient injected himself in the right thigh with no complications.  Lot: [id number] Exp: 08/2022

## 2020-06-17 DIAGNOSIS — Z20822 Contact with and (suspected) exposure to covid-19: Secondary | ICD-10-CM | POA: Diagnosis not present

## 2020-06-17 DIAGNOSIS — J441 Chronic obstructive pulmonary disease with (acute) exacerbation: Secondary | ICD-10-CM | POA: Diagnosis not present

## 2020-06-18 ENCOUNTER — Other Ambulatory Visit: Payer: Medicare Other

## 2020-06-18 ENCOUNTER — Ambulatory Visit: Payer: Medicare Other | Admitting: Hematology and Oncology

## 2020-06-19 ENCOUNTER — Telehealth: Payer: Self-pay | Admitting: Hematology and Oncology

## 2020-06-19 ENCOUNTER — Telehealth: Payer: Self-pay

## 2020-06-19 NOTE — Telephone Encounter (Signed)
R/s appt per 7/15 sch msg - pt is aware of new apt date and time

## 2020-06-19 NOTE — Telephone Encounter (Signed)
Received call from pt. requesting his appts. on Monday 06/23/20 be rescheduled to a week after. Pt. states he is currently on the beach and has come down with bronchitis, states he has been coughing and is very weak. Encouraged pt. to go to the ER if symptoms are severe and persists, pt. verbalized understanding.   Scheduling message sent to reschedule upcoming appts. as stated above.

## 2020-06-23 ENCOUNTER — Inpatient Hospital Stay: Payer: Medicare Other

## 2020-06-23 ENCOUNTER — Inpatient Hospital Stay: Payer: Medicare Other | Admitting: Hematology and Oncology

## 2020-06-25 ENCOUNTER — Other Ambulatory Visit: Payer: Self-pay

## 2020-06-25 ENCOUNTER — Ambulatory Visit: Payer: Medicare Other | Admitting: Dermatology

## 2020-06-25 DIAGNOSIS — I872 Venous insufficiency (chronic) (peripheral): Secondary | ICD-10-CM

## 2020-06-25 DIAGNOSIS — D492 Neoplasm of unspecified behavior of bone, soft tissue, and skin: Secondary | ICD-10-CM

## 2020-06-25 DIAGNOSIS — C44311 Basal cell carcinoma of skin of nose: Secondary | ICD-10-CM

## 2020-06-25 DIAGNOSIS — L570 Actinic keratosis: Secondary | ICD-10-CM | POA: Diagnosis not present

## 2020-06-25 DIAGNOSIS — L2089 Other atopic dermatitis: Secondary | ICD-10-CM

## 2020-06-25 DIAGNOSIS — D692 Other nonthrombocytopenic purpura: Secondary | ICD-10-CM | POA: Diagnosis not present

## 2020-06-25 NOTE — Progress Notes (Signed)
Follow-Up Visit   Subjective  Patrick Rodriguez. is a 86 y.o. male who presents for the following: Dermatitis (Atopic dermatitis. Dupixent therapy, tolerating well, no injection site reactions. Patient does own injections at home.). He also has a new sore on his nose present for days he would like evaluated.  The following portions of the chart were reviewed this encounter and updated as appropriate: Tobacco  Allergies  Meds  Problems  Med Hx  Surg Hx  Fam Hx      Objective  Well appearing patient in no apparent distress; mood and affect are within normal limits.  Review of Systems: No other skin or systemic complaints except as noted in HPI or Assessment and Plan.  A focused examination was performed including upper extremities, including the arms, hands, fingers, and fingernails. Relevant physical exam findings are noted in the Assessment and Plan.  Objective  Torso, extremeties: Scaly erythematous papules and patches +/- dyspigmentation, lichenification, excoriations.   Objective  Left Lower Leg - Anterior: Erythematous, scaly patches involving the ankle and distal lower leg with associated lower leg edema.   Objective  Left Forearm - Posterior, Right Forearm - Posterior: Violaceous macules and patches.   Objective  Right Tip of Nose: 0.6cm pink pearly papule  Objective  Scalp x4 (4): Erythematous thin papules/macules with gritty scale.   Assessment & Plan    Atopic dermatitis -severe on systemic Dupixent shots Much improved with no itch any longer. Torso, extremeties Continue Dupixent 300mg /52ml injections every 2 weeks. Pruritis has resolved since starting Dupixent. Severe atopic derm. Improving.  Venous stasis dermatitis of lower extremities Left Lower Leg - Anterior Continue wearing graduated compression stockings. Elevate legs as much as possible. Okay to use TAC 0.1% cream BID PRN when he has a rash, patient has Rx at home.   Senile purpura  Left  Forearm - Posterior; Right Forearm - Posterior Observe. Wear sunscreen QD. Much improved.  To new to avoid trauma and may use Derm and with Arnica  Neoplasm of skin Right Tip of Nose  Epidermal / dermal shaving  Lesion diameter (cm):  0.6 Informed consent: discussed and consent obtained   Timeout: patient name, date of birth, surgical site, and procedure verified   Procedure prep:  Patient was prepped and draped in usual sterile fashion Prep type:  Isopropyl alcohol Anesthesia: the lesion was anesthetized in a standard fashion   Anesthetic:  1% lidocaine w/ epinephrine 1-100,000 buffered w/ 8.4% NaHCO3 Instrument used: flexible razor blade   Hemostasis achieved with: pressure, aluminum chloride and electrodesiccation   Outcome: patient tolerated procedure well   Post-procedure details: sterile dressing applied and wound care instructions given   Dressing type: bandage and petrolatum   Additional details:  Post treatment defect -   Destruction of lesion Complexity: extensive   Destruction method: electrodesiccation and curettage   Informed consent: discussed and consent obtained   Timeout:  patient name, date of birth, surgical site, and procedure verified Procedure prep:  Patient was prepped and draped in usual sterile fashion Prep type:  Isopropyl alcohol Anesthesia: the lesion was anesthetized in a standard fashion   Anesthetic:  1% lidocaine w/ epinephrine 1-100,000 buffered w/ 8.4% NaHCO3 Curettage performed in three different directions: Yes   Electrodesiccation performed over the curetted area: Yes   Lesion length (cm):  0.6 Lesion width (cm):  0.6 Margin per side (cm):  0.2 Final wound size (cm):  1 Hemostasis achieved with:  pressure and aluminum chloride Outcome: patient tolerated  procedure well with no complications   Post-procedure details: sterile dressing applied and wound care instructions given   Dressing type: bandage and petrolatum    Specimen 1 - Surgical  pathology Differential Diagnosis: BCC vs other Check Margins: No 0.6cm pink pearly papule  Actinic keratosis x4 Scalp x4 Destruction of lesion - Scalp x4 Complexity: simple   Destruction method: cryotherapy   Informed consent: discussed and consent obtained   Timeout:  patient name, date of birth, surgical site, and procedure verified Lesion destroyed using liquid nitrogen: Yes   Region frozen until ice ball extended beyond lesion: Yes   Outcome: patient tolerated procedure well with no complications   Post-procedure details: wound care instructions given    Return in about 2 months (around 08/26/2020) for recheck AKs, check sun exposed areas.Loraine Maple, CMA, am acting as scribe for Sarina Ser, MD.  Documentation: I have reviewed the above documentation for accuracy and completeness, and I agree with the above.  Sarina Ser, MD

## 2020-06-25 NOTE — Patient Instructions (Signed)

## 2020-06-28 ENCOUNTER — Encounter: Payer: Self-pay | Admitting: Dermatology

## 2020-06-29 ENCOUNTER — Other Ambulatory Visit: Payer: Self-pay | Admitting: Hematology and Oncology

## 2020-06-29 DIAGNOSIS — D469 Myelodysplastic syndrome, unspecified: Secondary | ICD-10-CM

## 2020-06-30 ENCOUNTER — Inpatient Hospital Stay: Payer: Medicare Other | Admitting: Hematology and Oncology

## 2020-06-30 ENCOUNTER — Telehealth: Payer: Self-pay | Admitting: Hematology and Oncology

## 2020-06-30 ENCOUNTER — Inpatient Hospital Stay: Payer: Medicare Other

## 2020-06-30 ENCOUNTER — Telehealth: Payer: Self-pay

## 2020-06-30 ENCOUNTER — Other Ambulatory Visit: Payer: Self-pay

## 2020-06-30 ENCOUNTER — Inpatient Hospital Stay: Payer: Medicare Other | Attending: Hematology and Oncology

## 2020-06-30 VITALS — BP 167/59 | HR 82 | Temp 98.1°F | Resp 18 | Ht 68.0 in | Wt 152.5 lb

## 2020-06-30 DIAGNOSIS — D473 Essential (hemorrhagic) thrombocythemia: Secondary | ICD-10-CM | POA: Diagnosis not present

## 2020-06-30 DIAGNOSIS — D469 Myelodysplastic syndrome, unspecified: Secondary | ICD-10-CM | POA: Diagnosis not present

## 2020-06-30 DIAGNOSIS — D464 Refractory anemia, unspecified: Secondary | ICD-10-CM

## 2020-06-30 DIAGNOSIS — D649 Anemia, unspecified: Secondary | ICD-10-CM

## 2020-06-30 DIAGNOSIS — D75839 Thrombocytosis, unspecified: Secondary | ICD-10-CM

## 2020-06-30 LAB — SAMPLE TO BLOOD BANK

## 2020-06-30 LAB — CMP (CANCER CENTER ONLY)
ALT: 15 U/L (ref 0–44)
AST: 23 U/L (ref 15–41)
Albumin: 3.4 g/dL — ABNORMAL LOW (ref 3.5–5.0)
Alkaline Phosphatase: 46 U/L (ref 38–126)
Anion gap: 8 (ref 5–15)
BUN: 26 mg/dL — ABNORMAL HIGH (ref 8–23)
CO2: 24 mmol/L (ref 22–32)
Calcium: 9.5 mg/dL (ref 8.9–10.3)
Chloride: 107 mmol/L (ref 98–111)
Creatinine: 1.56 mg/dL — ABNORMAL HIGH (ref 0.61–1.24)
GFR, Est AFR Am: 44 mL/min — ABNORMAL LOW (ref >60)
GFR, Estimated: 38 mL/min — ABNORMAL LOW (ref >60)
Glucose, Bld: 143 mg/dL — ABNORMAL HIGH (ref 70–99)
Potassium: 4.8 mmol/L (ref 3.5–5.1)
Sodium: 139 mmol/L (ref 135–145)
Total Bilirubin: 0.3 mg/dL (ref 0.3–1.2)
Total Protein: 6.5 g/dL (ref 6.5–8.1)

## 2020-06-30 LAB — CBC WITH DIFFERENTIAL (CANCER CENTER ONLY)
Abs Immature Granulocytes: 5.07 10*3/uL — ABNORMAL HIGH (ref 0.00–0.07)
Basophils Absolute: 0.1 10*3/uL (ref 0.0–0.1)
Basophils Relative: 0 %
Eosinophils Absolute: 0.9 10*3/uL — ABNORMAL HIGH (ref 0.0–0.5)
Eosinophils Relative: 3 %
HCT: 26.7 % — ABNORMAL LOW (ref 39.0–52.0)
Hemoglobin: 8.6 g/dL — ABNORMAL LOW (ref 13.0–17.0)
Immature Granulocytes: 15 %
Lymphocytes Relative: 7 %
Lymphs Abs: 2.3 10*3/uL (ref 0.7–4.0)
MCH: 29.8 pg (ref 26.0–34.0)
MCHC: 32.2 g/dL (ref 30.0–36.0)
MCV: 92.4 fL (ref 80.0–100.0)
Monocytes Absolute: 1.6 10*3/uL — ABNORMAL HIGH (ref 0.1–1.0)
Monocytes Relative: 5 %
Neutro Abs: 23.6 10*3/uL — ABNORMAL HIGH (ref 1.7–7.7)
Neutrophils Relative %: 70 %
Platelet Count: 1556 10*3/uL (ref 150–400)
RBC: 2.89 MIL/uL — ABNORMAL LOW (ref 4.22–5.81)
RDW: 20 % — ABNORMAL HIGH (ref 11.5–15.5)
WBC Count: 33.6 10*3/uL — ABNORMAL HIGH (ref 4.0–10.5)
nRBC: 0.2 % (ref 0.0–0.2)

## 2020-06-30 LAB — LACTATE DEHYDROGENASE: LDH: 506 U/L — ABNORMAL HIGH (ref 98–192)

## 2020-06-30 NOTE — Telephone Encounter (Signed)
-----   Message from Ralene Bathe, MD sent at 06/26/2020  6:42 PM EDT ----- Skin , right tip of nose BASAL CELL CARCINOMA, NODULAR PATTERN  Cancer - BCC Already treated Recheck next visit

## 2020-06-30 NOTE — Telephone Encounter (Signed)
Scheduled per 7/26 los. Printed avs and calendar for pt.  

## 2020-06-30 NOTE — Telephone Encounter (Signed)
Patient informed of pathology results 

## 2020-07-01 ENCOUNTER — Encounter: Payer: Self-pay | Admitting: Hematology and Oncology

## 2020-07-01 NOTE — Progress Notes (Signed)
[employer] [phone]   [phone]  PROGRESS NOTE  Patient Care Team: Maury Dus, MD as PCP - General (Family Medicine) Martinique, Peter M, MD as PCP - Cardiology (Cardiology)  Hematological/Oncological History  #Myelodysplastic Syndrome/ NOS Myeloproliferative Syndrome 1)07/26/2018: WBC 5.1, Hgb 11.6, MCV 94.4, Plt 363 2) 9//2020: WBC 25.1, Hgb 9.2, Plt 1083, MCV 93.2 (in context of eczema flare) 3) 09/20/2019: Iron 128, TIBC 242 (low), Iron sat 53%, transferrin 173 4) 10/15/2019: WBC 14.3, Hgb 8.8, Plt 485, MCV 90.6. FOB negative 5) 11/07/2019: Establish care with Dr. Lorenso Courier 6) 11/26/2019: Bone marrow biopsy performed. Showed hypercellular marrow with 9% blasts, concern for MDS vs MPN. MDS panel negative for definitive mutation. WBC 29.3, Hgb 8.3, Plt 1008.  7) 12/14/2019: WBC 30.1, Hgb 7.8, Plt 1718. Received 1 units of PRBC for symptomatic anemia. BCR/ABL and JAK2 w/ reflex panels sent. Positive for U2AF1 and SRSF2 gene mutations. Neither are canonical mutations for MPN/MDS 8) 02/21/2020: received 1 unit of PRBC for Hgb 8.5 (symptomatic) 9) 04/24/2020: received 1 unit of PRBC for Hgb 8.2 (symptomatic) 10) 05/19/2020:  received 1 unit of PRBC for Hgb 8.0 (symptomatic)  Interval History:  [name] 86 y.o. male with medical history significant for leukocytosis, thrombocytosis, and anemia presents for a follow up visit. The patient's last visit was on 03/26/20. In the interim since the last visit Mr. Dibble has received a blood transfusion on 04/24/2020 and 05/19/2020 for Hgb of 8.2 and 8.0 (respectively) with symptoms.   On exam today her back notes that he has been at his baseline level of health.  He reports that he went to the beach for a week to see his grandchildren, but unfortunately he spent most the time staying in the house due to a cough that he had.  He did have a temperature of 101 F and did present to the local emergency department where  he was given treatment.  He notes that he is subsequently "got over".  He notes that he does have weakness from his shortness of breath.  He does have occasional gum bleeding, but no bleeding consistent with the type of bleeding that he had during the episode earlier in the year.  On further discussion he notes that his energy is "nonexistent".  He says that he cannot seem to make it work.  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  Otherwise he currently denies having any issues with fevers, chills, sweats, nausea, vomiting or diarrhea.  A full ten point ROS is listed below   MEDICAL HISTORY:  Past Medical History:  Diagnosis Date  . Allergic rhinitis   . Basal cell carcinoma 01/07/2020   left preauricular  . Carotid artery stenosis    12/2011:  50-69% on the left and 1-49% on the right;  Carotid US (11/2013): Bilateral 40-59%; repeat 1 year  . Cataract   . Coronary artery disease    a.  s/p stenting of the LAD in 2000;  b. Lexiscan Myoview (12/2013): No ischemia, EF 70%; normal study  . ED (erectile dysfunction)   . GERD (gastroesophageal reflux disease)   . Hyperlipidemia   . Hyperplastic colon polyp 11/15/2013   x3  . Hypertension   . Myocardial infarction ([place])   . Normal echocardiogram Jan 2013   EF of 55% and no wall motion abnormalities  . Normal nuclear stress test 2008  .  Obesity   . Palpitations    Holter (11/2013): NSR, sinus brady, PVCs, rare blocked PAC, no sig arrhythmia  . Squamous cell carcinoma of skin 01/07/2020   right lat elbow  . Substance abuse ([place])   . Syncope Jan 2013   felt to be vasovagal; had normal echo, carotids ok, 1st degree AV block with mention (but no tracing) of 2nd degree type I AV block while in [place]  . Tubular adenoma 11/15/2013   x2    SURGICAL HISTORY: Past Surgical History:  Procedure Laterality Date  . APPENDECTOMY    . CARDIAC CATHETERIZATION  01/20/1999   EF 60%    . CARDIOVASCULAR STRESS TEST  04/19/2007   EF 74%  . CORONARY STENT PLACEMENT  2000   LAD  . DOPPLER ECHOCARDIOGRAPHY  02/06/1998   EF 60%  . TONSILLECTOMY     ALLERGIES:  is allergic to hctz [hydrochlorothiazide].  MEDICATIONS:  Current Outpatient Medications  Medication Sig Dispense Refill  . TURMERIC PO Take by mouth.    Marland Kitchen amLODipine (NORVASC) 10 MG tablet TAKE 1 TABLET BY MOUTH DAILY 90 tablet 3  . Ascorbic Acid (VITAMIN C) 1000 MG tablet Take 1,000 mg by mouth daily.      Marland Kitchen aspirin 81 MG tablet Take 81 mg by mouth daily.    Marland Kitchen b complex vitamins tablet Take 1 tablet by mouth daily.      . Coenzyme Q10 (CO Q-10) 100 MG CAPS Take 1 capsule by mouth every morning.    . diphenhydrAMINE (BENADRYL) 25 MG tablet Take 25 mg by mouth at bedtime as needed.    . doxazosin (CARDURA) 4 MG tablet TAKE 1 TABLET BY MOUTH DAILY 90 tablet 1  . Dupilumab (DUPIXENT [place]) Inject into the skin every 14 (fourteen) days.    . DUPIXENT 300 MG/2ML SOPN     . magnesium gluconate (MAGONATE) 500 MG tablet Take 500 mg by mouth daily.      . metoprolol succinate (TOPROL XL) 25 MG 24 hr tablet Take 1 tablet (25 mg total) by mouth daily. 90 tablet 3  . Multiple Vitamins-Minerals (ZINC PO) Take by mouth.    . Nattokinase 100 MG CAPS Take by mouth daily.      Marland Kitchen omeprazole (PRILOSEC) 40 MG capsule Take 40 mg by mouth daily.    . tranexamic acid (LYSTEDA) 650 MG TABS tablet Take 1 tablet (650 mg total) by mouth in the morning, at noon, in the evening, and at bedtime. Dissolve 1 tablet in 10-15 ml of water, allow it to dissolve over 3-5 minutes. Swish in mouth x 2 minutes then spit. Repeat every 6 hours until bleeding stops, then continue for another 24 hours. 20 tablet 0   Current Facility-Administered Medications  Medication Dose Route Frequency Provider Last Rate Last Admin  . Dupilumab SOPN 300 mg  300 mg Subcutaneous Q14 Days Ralene Bathe, MD   300 mg at 06/04/20 1359    REVIEW OF SYSTEMS:   Constitutional:  ( - ) fevers, ( - )  chills , ( - ) night sweats Eyes: ( - ) blurriness of vision, ( - ) double vision, ( - ) watery eyes Ears, nose, mouth, throat, and face: ( - ) mucositis, ( - ) sore throat Respiratory: ( - ) cough, ( - ) dyspnea, ( - ) wheezes Cardiovascular: ( - ) palpitation, ( - ) chest discomfort, ( - ) lower extremity swelling Gastrointestinal:  ( - ) nausea, ( - ) heartburn, ( - )  change in bowel habits Skin: ( - ) abnormal skin rashes Lymphatics: ( - ) new lymphadenopathy, ( - ) easy bruising Neurological: ( - ) numbness, ( - ) tingling, ( - ) new weaknesses Behavioral/Psych: ( - ) mood change, ( - ) new changes  All other systems were reviewed with the patient and are negative.  PHYSICAL EXAMINATION: ECOG PERFORMANCE STATUS: 1 - Symptomatic but completely ambulatory  Vitals:   06/30/20 1101  BP: (!) 167/59  Pulse: 82  Resp: 18  Temp: 98.1 F (36.7 C)  SpO2: 99%   Filed Weights   06/30/20 1101  Weight: 152 lb 8 oz (69.2 kg)    GENERAL: well appearing elderly Caucasian male in NAD SKIN: skin color, texture, turgor are normal, no rashes or significant lesions EYES: conjunctiva are pink and non-injected, sclera clear LUNGS: clear to auscultation and percussion with normal breathing effort HEART: regular rate & rhythm and no murmurs and +2 bilateral lower extremity edema Musculoskeletal: no cyanosis of digits and no clubbing  PSYCH: alert & oriented x 3, fluent speech NEURO: no focal motor/sensory deficits  LABORATORY DATA:  I have reviewed the data as listed CBC Latest Ref Rng & Units 06/30/2020 05/19/2020 04/28/2020  WBC 4.0 - 10.5 K/uL 33.6(H) 32.4(H) 33.9(H)  Hemoglobin 13.0 - 17.0 g/dL 8.6(L) 8.0(L) 8.5(L)  Hematocrit 39 - 52 % 26.7(L) 25.1(L) 26.2(L)  Platelets 150 - 400 K/uL 1,556(HH) 870(H) 999(HH)    CMP Latest Ref Rng & Units 06/30/2020 03/26/2020 01/24/2020  Glucose 70 - 99 mg/dL 143(H) 119(H) 138(H)  BUN 8 - 23 mg/dL 26(H) 23 25(H)  Creatinine 0.61 -  1.24 mg/dL 1.56(H) 1.51(H) 1.57(H)  Sodium 135 - 145 mmol/L 139 141 138  Potassium 3.5 - 5.1 mmol/L 4.8 4.4 4.4  Chloride 98 - 111 mmol/L 107 108 107  CO2 22 - 32 mmol/L '24 23 24  ' Calcium 8.9 - 10.3 mg/dL 9.5 8.9 8.5(L)  Total Protein 6.5 - 8.1 g/dL 6.5 6.6 6.4(L)  Total Bilirubin 0.3 - 1.2 mg/dL 0.3 0.4 0.3  Alkaline Phos 38 - 126 U/L 46 53 48  AST 15 - 41 U/L '23 19 19  ' ALT 0 - 44 U/L '15 13 17    ' RADIOGRAPHIC STUDIES: No results found.  ASSESSMENT & PLAN Patrick Rodriguez. 86 y.o. male with medical history significant for leukocytosis, thrombocytosis, and anemia presents for a follow up visit. His lab findings and bone marrow biopsy are consistent with MDS vs NOS MPN.   On exam today back is at baseline with the exception of a recent episode of bronchitis that he had.  He is currently recovering from this infection and has been otherwise quite well.  He denies having any further issues with bleeding or excessive bruising.  He is agreeable to continuing surveillance of condition with transfusion support as needed.  Bone marrow biopsy performed on 11/26/2019 showed a hypercellular marrow consistent with MDS versus MPN with 9% blasts.  MDS panel was negative for definitive mutation at that time. Additional workup showed no JAK2, CALR, MPL, or BCR/ABL. He does carry 2 mutations  U2AF1 and SRSF2 which are of unclear clinical significance  We previously discussed that are working diagnosis at this time is MDS with some component of MPN.  We talked about treatments for MDS, however the patient noted that he would not want any chemotherapy regimens.  I did briefly note that hypomethylation therapy is not technically chemotherapy, but he declined these IV treatments at this time. On  further discussion we talked about the nature of his disease and the fact that he carries 2 nonclinical mutations but no targetable mutations at this time.  We also discussed that this could be treated as an MDS with  hypomethylating therapy, but he noted that that is not something that he would want to consider at this time.  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    Given his advanced age I would agree that supportive therapy with transfusions would not be an unreasonable option if this was consistent with his goals of care. We can also consider erythropoetin therapy if the frequency of blood transfusions increases to >2 per month.   #Myelodysplastic Syndrome/ NOS Myeloproliferative Syndrome --bone marrow biopsy on 11/26/2019 showed hypercellular marrow with concern for MDS vs MPN. MDS panel negative for definitive mutation and canonical mutations such as JAK2, CALR, MPL, and BCR/ABL were negative. He does carry 2 mutations  U2AF1 and SRSF2 which are of unclear clinical significance --no indication for blood transfusion today, we will have patient return monthly for lab check CBC and possible transfusion.  --RTC in approximately 2 months to reassess or sooner if indicated by his blood work.   All questions were answered. The patient knows to call the clinic with any problems, questions or concerns.  A total of more than 30 minutes were spent on this encounter and over half of that time was spent on counseling and coordination of care as outlined above.   Ledell Peoples, MD Department of Hematology/Oncology [place] [facility] Phone: [phone] Pager: [phone] Email: [email or web address]  07/01/2020 2:11 PM

## 2020-07-09 DIAGNOSIS — R0981 Nasal congestion: Secondary | ICD-10-CM | POA: Diagnosis not present

## 2020-07-28 ENCOUNTER — Ambulatory Visit: Payer: Medicare Other

## 2020-07-28 ENCOUNTER — Inpatient Hospital Stay: Payer: Medicare Other

## 2020-07-28 ENCOUNTER — Other Ambulatory Visit: Payer: Self-pay

## 2020-07-28 ENCOUNTER — Inpatient Hospital Stay: Payer: Medicare Other | Attending: Hematology and Oncology

## 2020-07-28 DIAGNOSIS — D469 Myelodysplastic syndrome, unspecified: Secondary | ICD-10-CM | POA: Insufficient documentation

## 2020-07-28 LAB — CBC WITH DIFFERENTIAL (CANCER CENTER ONLY)
Abs Immature Granulocytes: 1.24 10*3/uL — ABNORMAL HIGH (ref 0.00–0.07)
Basophils Absolute: 0.1 10*3/uL (ref 0.0–0.1)
Basophils Relative: 1 %
Eosinophils Absolute: 0.6 10*3/uL — ABNORMAL HIGH (ref 0.0–0.5)
Eosinophils Relative: 4 %
HCT: 26.8 % — ABNORMAL LOW (ref 39.0–52.0)
Hemoglobin: 8.7 g/dL — ABNORMAL LOW (ref 13.0–17.0)
Immature Granulocytes: 7 %
Lymphocytes Relative: 8 %
Lymphs Abs: 1.4 10*3/uL (ref 0.7–4.0)
MCH: 29.1 pg (ref 26.0–34.0)
MCHC: 32.5 g/dL (ref 30.0–36.0)
MCV: 89.6 fL (ref 80.0–100.0)
Monocytes Absolute: 0.5 10*3/uL (ref 0.1–1.0)
Monocytes Relative: 3 %
Neutro Abs: 13.5 10*3/uL — ABNORMAL HIGH (ref 1.7–7.7)
Neutrophils Relative %: 77 %
Platelet Count: 481 10*3/uL — ABNORMAL HIGH (ref 150–400)
RBC: 2.99 MIL/uL — ABNORMAL LOW (ref 4.22–5.81)
RDW: 19.9 % — ABNORMAL HIGH (ref 11.5–15.5)
WBC Count: 17.3 10*3/uL — ABNORMAL HIGH (ref 4.0–10.5)
nRBC: 0 % (ref 0.0–0.2)

## 2020-07-28 LAB — SAMPLE TO BLOOD BANK

## 2020-07-28 NOTE — Patient Instructions (Signed)

## 2020-07-28 NOTE — Progress Notes (Signed)
Hgb 8.7 today.  MD Lorenso Courier aware.  Pt asymptomatic.  No blood today per MD Lorenso Courier.

## 2020-08-13 ENCOUNTER — Telehealth: Payer: Self-pay | Admitting: *Deleted

## 2020-08-13 NOTE — Telephone Encounter (Signed)
Received vm message from patient asking for a call back today. [name] patient and spoke with him. He is asking to change his appt times to later in the day on 08/25/20 due to a schedule conflict.  Scheduling message sent. Pt aware of new time on 08/25/20 of 1:30 for labs and 2 pm for Dr. Lorenso Courier

## 2020-08-14 ENCOUNTER — Other Ambulatory Visit: Payer: Self-pay | Admitting: Physician Assistant

## 2020-08-14 ENCOUNTER — Other Ambulatory Visit: Payer: Self-pay | Admitting: Cardiology

## 2020-08-15 ENCOUNTER — Telehealth: Payer: Self-pay | Admitting: Hematology and Oncology

## 2020-08-15 NOTE — Telephone Encounter (Signed)
R/s appt per 9/08 sch msg - pt is aware of change

## 2020-08-19 ENCOUNTER — Telehealth: Payer: Self-pay | Admitting: Hematology and Oncology

## 2020-08-19 NOTE — Telephone Encounter (Signed)
Moved 9/20 MD appt to 9/23 per provider on call. Called and spoke with patient. Confirmed new appt

## 2020-08-25 ENCOUNTER — Other Ambulatory Visit: Payer: Medicare Other

## 2020-08-25 ENCOUNTER — Other Ambulatory Visit: Payer: Self-pay

## 2020-08-25 ENCOUNTER — Ambulatory Visit: Payer: Medicare Other | Admitting: Hematology and Oncology

## 2020-08-25 ENCOUNTER — Inpatient Hospital Stay: Payer: Medicare Other | Attending: Hematology and Oncology

## 2020-08-25 ENCOUNTER — Ambulatory Visit: Payer: Medicare Other

## 2020-08-25 ENCOUNTER — Inpatient Hospital Stay: Payer: Medicare Other

## 2020-08-25 DIAGNOSIS — K219 Gastro-esophageal reflux disease without esophagitis: Secondary | ICD-10-CM | POA: Diagnosis not present

## 2020-08-25 DIAGNOSIS — Z1389 Encounter for screening for other disorder: Secondary | ICD-10-CM | POA: Diagnosis not present

## 2020-08-25 DIAGNOSIS — D469 Myelodysplastic syndrome, unspecified: Secondary | ICD-10-CM | POA: Insufficient documentation

## 2020-08-25 DIAGNOSIS — R7309 Other abnormal glucose: Secondary | ICD-10-CM | POA: Diagnosis not present

## 2020-08-25 DIAGNOSIS — Z136 Encounter for screening for cardiovascular disorders: Secondary | ICD-10-CM | POA: Diagnosis not present

## 2020-08-25 DIAGNOSIS — I129 Hypertensive chronic kidney disease with stage 1 through stage 4 chronic kidney disease, or unspecified chronic kidney disease: Secondary | ICD-10-CM | POA: Diagnosis not present

## 2020-08-25 DIAGNOSIS — I2510 Atherosclerotic heart disease of native coronary artery without angina pectoris: Secondary | ICD-10-CM | POA: Diagnosis not present

## 2020-08-25 DIAGNOSIS — Z0000 Encounter for general adult medical examination without abnormal findings: Secondary | ICD-10-CM | POA: Diagnosis not present

## 2020-08-25 DIAGNOSIS — D500 Iron deficiency anemia secondary to blood loss (chronic): Secondary | ICD-10-CM | POA: Diagnosis not present

## 2020-08-25 LAB — CBC WITH DIFFERENTIAL (CANCER CENTER ONLY)
Abs Immature Granulocytes: 3.12 10*3/uL — ABNORMAL HIGH (ref 0.00–0.07)
Basophils Absolute: 0.2 10*3/uL — ABNORMAL HIGH (ref 0.0–0.1)
Basophils Relative: 1 %
Eosinophils Absolute: 0.6 10*3/uL — ABNORMAL HIGH (ref 0.0–0.5)
Eosinophils Relative: 2 %
HCT: 26.2 % — ABNORMAL LOW (ref 39.0–52.0)
Hemoglobin: 8.7 g/dL — ABNORMAL LOW (ref 13.0–17.0)
Immature Granulocytes: 12 %
Lymphocytes Relative: 10 %
Lymphs Abs: 2.7 10*3/uL (ref 0.7–4.0)
MCH: 29.8 pg (ref 26.0–34.0)
MCHC: 33.2 g/dL (ref 30.0–36.0)
MCV: 89.7 fL (ref 80.0–100.0)
Monocytes Absolute: 0.3 10*3/uL (ref 0.1–1.0)
Monocytes Relative: 1 %
Neutro Abs: 20.3 10*3/uL — ABNORMAL HIGH (ref 1.7–7.7)
Neutrophils Relative %: 74 %
Platelet Count: 611 10*3/uL — ABNORMAL HIGH (ref 150–400)
RBC: 2.92 MIL/uL — ABNORMAL LOW (ref 4.22–5.81)
RDW: 21.2 % — ABNORMAL HIGH (ref 11.5–15.5)
WBC Count: 27.2 10*3/uL — ABNORMAL HIGH (ref 4.0–10.5)
nRBC: 0.1 % (ref 0.0–0.2)

## 2020-08-25 LAB — SAMPLE TO BLOOD BANK

## 2020-08-25 NOTE — Progress Notes (Signed)
Pt. denies complaints of chest pain, dizziness, weakness, and no shortness of breath noted. Hgb 8.7  Per Dr. Lorenso Courier no transfusion today if pt. asymptomatic Pt. left via ambulation, no shortness of breath noted. Instructed pt. to call for any issues or concerns.

## 2020-08-26 ENCOUNTER — Encounter: Payer: Self-pay | Admitting: Dermatology

## 2020-08-26 ENCOUNTER — Ambulatory Visit: Payer: Medicare Other | Admitting: Dermatology

## 2020-08-26 DIAGNOSIS — L2089 Other atopic dermatitis: Secondary | ICD-10-CM

## 2020-08-26 DIAGNOSIS — D469 Myelodysplastic syndrome, unspecified: Secondary | ICD-10-CM

## 2020-08-26 DIAGNOSIS — D0462 Carcinoma in situ of skin of left upper limb, including shoulder: Secondary | ICD-10-CM | POA: Diagnosis not present

## 2020-08-26 DIAGNOSIS — L570 Actinic keratosis: Secondary | ICD-10-CM | POA: Diagnosis not present

## 2020-08-26 DIAGNOSIS — Z85828 Personal history of other malignant neoplasm of skin: Secondary | ICD-10-CM

## 2020-08-26 DIAGNOSIS — L820 Inflamed seborrheic keratosis: Secondary | ICD-10-CM | POA: Diagnosis not present

## 2020-08-26 DIAGNOSIS — D692 Other nonthrombocytopenic purpura: Secondary | ICD-10-CM | POA: Diagnosis not present

## 2020-08-26 DIAGNOSIS — D485 Neoplasm of uncertain behavior of skin: Secondary | ICD-10-CM

## 2020-08-26 NOTE — Progress Notes (Signed)
Follow-Up Visit   Subjective  Patrick Rodriguez. is a 86 y.o. male who presents for the following: Follow-up (AK follow up of scalp - LN2 x 4) and Basal Cell Carcinoma (Biopsy proven BCC of right tip of nose - Biopsy and [facility] 06/25/2020). The patient presents for Upper Body Skin Exam (UBSE) for skin cancer screening and mole check.  The following portions of the chart were reviewed this encounter and updated as appropriate:  Tobacco   Allergies   Meds   Problems   Med Hx   Surg Hx   Fam Hx      Review of Systems:  No other skin or systemic complaints except as noted in HPI or Assessment and Plan.  Objective  Well appearing patient in no apparent distress; mood and affect are within normal limits.  All skin waist up examined.  Objective  Trunk and extermities: Clear today.  Objective  Right Tip of Nose: Well healed scar with no evidence of recurrence.   Objective  Scalp/face (4): Erythematous thin papules/macules with gritty scale.   Objective  Left lat elbow: 0.6 cm cutaneous horn  Objective  Right Temple (2): Erythematous keratotic or waxy stuck-on papule or plaque.    Assessment & Plan    Myelodysplasia - exacerbating Atopic Dermatitis and contributing to Purpura  Purpura - possibly exacerbated by Myelodysplasia and scratching from itchy Atopy - Violaceous macules and patches - Benign - Related to age, sun damage and/or use of blood thinners - Observe - Can use OTC arnica containing moisturizer such as Dermend Bruise Formula if desired - Call for worsening or other concerns  Other atopic dermatitis - Complicated patient due to other Comorbidities interacting Trunk and extermities Atopic Dermatitis - Severe with itch and exacerbated by Myelodysplasia - currently better controlled on systemic Dupixent shots Well controlled on Dupixent injections.   Continue Dupixent injections q2weeks.  History of basal cell carcinoma (BCC) Right Tip of Nose Clear. Observe  for recurrence. Call clinic for new or changing lesions.  Recommend regular skin exams, daily broad-spectrum spf 30+ sunscreen use, and photoprotection.     AK (actinic keratosis) (4) Scalp/face  Destruction of lesion - Scalp/face Complexity: simple   Destruction method: cryotherapy   Informed consent: discussed and consent obtained   Timeout:  patient name, date of birth, surgical site, and procedure verified Lesion destroyed using liquid nitrogen: Yes   Region frozen until ice ball extended beyond lesion: Yes   Outcome: patient tolerated procedure well with no complications   Post-procedure details: wound care instructions given    Neoplasm of uncertain behavior of skin Left lat elbow  Epidermal / dermal shaving  Lesion diameter (cm):  0.6 Informed consent: discussed and consent obtained   Timeout: patient name, date of birth, surgical site, and procedure verified   Procedure prep:  Patient was prepped and draped in usual sterile fashion Prep type:  Isopropyl alcohol Anesthesia: the lesion was anesthetized in a standard fashion   Anesthetic:  1% lidocaine w/ epinephrine 1-100,000 buffered w/ 8.4% NaHCO3 Instrument used: flexible razor blade   Hemostasis achieved with: pressure, aluminum chloride and electrodesiccation   Outcome: patient tolerated procedure well   Post-procedure details: sterile dressing applied and wound care instructions given   Dressing type: bandage and petrolatum    Destruction of lesion Complexity: extensive   Destruction method: electrodesiccation and curettage   Informed consent: discussed and consent obtained   Timeout:  patient name, date of birth, surgical site, and procedure verified  Procedure prep:  Patient was prepped and draped in usual sterile fashion Prep type:  Isopropyl alcohol Anesthesia: the lesion was anesthetized in a standard fashion   Anesthetic:  1% lidocaine w/ epinephrine 1-100,000 buffered w/ 8.4% NaHCO3 Curettage performed in  three different directions: Yes   Electrodesiccation performed over the curetted area: Yes   Lesion length (cm):  0.6 Lesion width (cm):  0.6 Margin per side (cm):  0.2 Final wound size (cm):  1 Hemostasis achieved with:  pressure and aluminum chloride Outcome: patient tolerated procedure well with no complications   Post-procedure details: sterile dressing applied and wound care instructions given   Dressing type: bandage and petrolatum    Specimen 1 - Surgical pathology Differential Diagnosis: SCC vs other  Check Margins: No 0.6 cm cutaneous horn  Inflamed seborrheic keratosis (2) Right Temple  Destruction of lesion - Right Temple Complexity: simple   Destruction method: cryotherapy   Informed consent: discussed and consent obtained   Timeout:  patient name, date of birth, surgical site, and procedure verified Lesion destroyed using liquid nitrogen: Yes   Region frozen until ice ball extended beyond lesion: Yes   Outcome: patient tolerated procedure well with no complications   Post-procedure details: wound care instructions given    Return in about 4 months (around 12/26/2020).  I, Ashok Cordia, CMA, am acting as scribe for Sarina Ser, MD .  Documentation: I have reviewed the above documentation for accuracy and completeness, and I agree with the above.  Sarina Ser, MD

## 2020-08-26 NOTE — Patient Instructions (Signed)

## 2020-08-27 ENCOUNTER — Encounter: Payer: Self-pay | Admitting: Dermatology

## 2020-08-28 ENCOUNTER — Other Ambulatory Visit: Payer: Self-pay

## 2020-08-28 ENCOUNTER — Inpatient Hospital Stay: Payer: Medicare Other | Admitting: Hematology and Oncology

## 2020-08-28 ENCOUNTER — Telehealth: Payer: Self-pay | Admitting: Hematology and Oncology

## 2020-08-28 VITALS — BP 176/55 | HR 72 | Temp 96.8°F | Resp 18 | Ht 68.0 in | Wt 153.1 lb

## 2020-08-28 DIAGNOSIS — D649 Anemia, unspecified: Secondary | ICD-10-CM

## 2020-08-28 DIAGNOSIS — D75839 Thrombocytosis, unspecified: Secondary | ICD-10-CM

## 2020-08-28 DIAGNOSIS — D469 Myelodysplastic syndrome, unspecified: Secondary | ICD-10-CM | POA: Diagnosis not present

## 2020-08-28 DIAGNOSIS — D473 Essential (hemorrhagic) thrombocythemia: Secondary | ICD-10-CM | POA: Diagnosis not present

## 2020-08-28 NOTE — Progress Notes (Signed)
[place] [phone]   [phone]  PROGRESS NOTE  Patient Care Team: Maury Dus, MD as PCP - General (Family Medicine) Martinique, Peter M, MD as PCP - Cardiology (Cardiology)  Hematological/Oncological History  #Myelodysplastic Syndrome/ NOS Myeloproliferative Syndrome 1)07/26/2018: WBC 5.1, Hgb 11.6, MCV 94.4, Plt 363 2) 9//2020: WBC 25.1, Hgb 9.2, Plt 1083, MCV 93.2 (in context of eczema flare) 3) 09/20/2019: Iron 128, TIBC 242 (low), Iron sat 53%, transferrin 173 4) 10/15/2019: WBC 14.3, Hgb 8.8, Plt 485, MCV 90.6. FOB negative 5) 11/07/2019: Establish care with Dr. Lorenso Courier 6) 11/26/2019: Bone marrow biopsy performed. Showed hypercellular marrow with 9% blasts, concern for MDS vs MPN. MDS panel negative for definitive mutation. WBC 29.3, Hgb 8.3, Plt 1008.  7) 12/14/2019: WBC 30.1, Hgb 7.8, Plt 1718. Received 1 units of PRBC for symptomatic anemia. BCR/ABL and JAK2 w/ reflex panels sent. Positive for U2AF1 and SRSF2 gene mutations. Neither are canonical mutations for MPN/MDS 8) 02/21/2020: received 1 unit of PRBC for Hgb 8.5 (symptomatic) 9) 04/24/2020: received 1 unit of PRBC for Hgb 8.2 (symptomatic) 10) 05/19/2020:  received 1 unit of PRBC for Hgb 8.0 (symptomatic)  Interval History:  [name] 86 y.o. male with medical history significant for leukocytosis, thrombocytosis, and anemia presents for a follow up visit. The patient's last visit was on 03/26/20. In the interim since the last visit Mr. Chichester has received a blood transfusion on 04/24/2020 and 05/19/2020 for Hgb of 8.2 and 8.0 (respectively) with symptoms.   On exam today Mr. Lotito notes that he has been at his baseline level of health since his last visit.  He has not required a transfusion since 05/19/2020 at which time he received 1 unit of packed red blood cells for hemoglobin of 8.0 which was symptomatic.  He reports that he has had some increased shortness of breath, but he believes  that this is related to his COPD.  He had a episode of bronchitis in July 2021, but his breathing has improved since that time.  He notes that his energy has been better since his last visit and that he can "still get out and work in the yard, my energy is reasonable".  He is excited for an upcoming trip to [place] where he will be performing the wedding ceremony for his granddaughter.  Overall he denies having any issues with fevers, chills, sweats, nausea, vomiting or diarrhea.  He has no overt bleeding signs or symptoms at this time.  A full 10 point ROS is listed below.  MEDICAL HISTORY:  Past Medical History:  Diagnosis Date   Allergic rhinitis    Basal cell carcinoma 01/07/2020   left preauricular   Basal cell carcinoma 06/25/2020   right tip of nose   Carotid artery stenosis    12/2011:  50-69% on the left and 1-49% on the right;  Carotid US (11/2013): Bilateral 40-59%; repeat 1 year   Cataract    Coronary artery disease    a.  s/p stenting of the LAD in 2000;  b. Lexiscan Myoview (12/2013): No ischemia, EF 70%; normal study   ED (erectile dysfunction)    GERD (gastroesophageal reflux disease)    Hyperlipidemia    Hyperplastic colon polyp 11/15/2013   x3   Hypertension    Myocardial infarction [facility])    Normal echocardiogram Jan 2013   EF of 55% and no wall motion abnormalities   Normal nuclear stress test 2008   Obesity    Palpitations  Holter (11/2013): NSR, sinus brady, PVCs, rare blocked PAC, no sig arrhythmia   Squamous cell carcinoma of skin 01/07/2020   right lat elbow   Substance abuse [facility])    Syncope Jan 2013   felt to be vasovagal; had normal echo, carotids ok, 1st degree AV block with mention (but no tracing) of 2nd degree type I AV block while in [place]   Tubular adenoma 11/15/2013   x2    SURGICAL HISTORY: Past Surgical History:  Procedure Laterality Date   APPENDECTOMY     CARDIAC CATHETERIZATION  01/20/1999   EF 60%   CARDIOVASCULAR  STRESS TEST  04/19/2007   EF 74%   CORONARY STENT PLACEMENT  2000   LAD   DOPPLER ECHOCARDIOGRAPHY  02/06/1998   EF 60%   TONSILLECTOMY     ALLERGIES:  is allergic to hctz [hydrochlorothiazide].  MEDICATIONS:  Current Outpatient Medications  Medication Sig Dispense Refill   doxazosin (CARDURA) 4 MG tablet TAKE 1 TABLET BY MOUTH DAILY 90 tablet 1   Dupilumab (DUPIXENT [place]) Inject into the skin every 14 (fourteen) days.     magnesium gluconate (MAGONATE) 500 MG tablet Take 500 mg by mouth daily.       Multiple Vitamins-Minerals (ZINC PO) Take by mouth.     Nattokinase 100 MG CAPS Take by mouth daily.       omeprazole (PRILOSEC) 40 MG capsule Take 40 mg by mouth daily.     TURMERIC PO Take by mouth.     amLODipine (NORVASC) 10 MG tablet TAKE 1 TABLET BY MOUTH DAILY 90 tablet 3   Ascorbic Acid (VITAMIN C) 1000 MG tablet Take 1,000 mg by mouth daily.       aspirin 81 MG tablet Take 81 mg by mouth daily. (Patient not taking: Reported on 08/28/2020)     b complex vitamins tablet Take 1 tablet by mouth daily.       DUPIXENT 300 MG/2ML SOPN      metoprolol succinate (TOPROL XL) 25 MG 24 hr tablet Take 1 tablet (25 mg total) by mouth daily. (Patient not taking: Reported on 08/28/2020) 90 tablet 3   tranexamic acid (LYSTEDA) 650 MG TABS tablet Take 1 tablet (650 mg total) by mouth in the morning, at noon, in the evening, and at bedtime. Dissolve 1 tablet in 10-15 ml of water, allow it to dissolve over 3-5 minutes. Swish in mouth x 2 minutes then spit. Repeat every 6 hours until bleeding stops, then continue for another 24 hours. 20 tablet 0   Current Facility-Administered Medications  Medication Dose Route Frequency Provider Last Rate Last Admin   Dupilumab SOPN 300 mg  300 mg Subcutaneous Q14 Days Ralene Bathe, MD   300 mg at 06/04/20 1359    REVIEW OF SYSTEMS:   Constitutional: ( - ) fevers, ( - )  chills , ( - ) night sweats Eyes: ( - ) blurriness of vision, ( - ) double  vision, ( - ) watery eyes Ears, nose, mouth, throat, and face: ( - ) mucositis, ( - ) sore throat Respiratory: ( - ) cough, ( - ) dyspnea, ( - ) wheezes Cardiovascular: ( - ) palpitation, ( - ) chest discomfort, ( - ) lower extremity swelling Gastrointestinal:  ( - ) nausea, ( - ) heartburn, ( - ) change in bowel habits Skin: ( - ) abnormal skin rashes Lymphatics: ( - ) new lymphadenopathy, ( - ) easy bruising Neurological: ( - ) numbness, ( - ) tingling, ( - )  new weaknesses Behavioral/Psych: ( - ) mood change, ( - ) new changes  All other systems were reviewed with the patient and are negative.  PHYSICAL EXAMINATION: ECOG PERFORMANCE STATUS: 1 - Symptomatic but completely ambulatory  Vitals:   08/28/20 1147  BP: (!) 176/55  Pulse: 72  Resp: 18  Temp: (!) 96.8 F (36 C)  SpO2: 100%   Filed Weights   08/28/20 1147  Weight: 153 lb 1.6 oz (69.4 kg)    GENERAL: well appearing elderly Caucasian male in NAD SKIN: skin color, texture, turgor are normal, no rashes or significant lesions EYES: conjunctiva are pink and non-injected, sclera clear LUNGS: clear to auscultation and percussion with normal breathing effort HEART: regular rate & rhythm and no murmurs and +1 bilateral lower extremity edema Musculoskeletal: no cyanosis of digits and no clubbing  PSYCH: alert & oriented x 3, fluent speech NEURO: no focal motor/sensory deficits  LABORATORY DATA:  I have reviewed the data as listed CBC Latest Ref Rng & Units 08/25/2020 07/28/2020 06/30/2020  WBC 4.0 - 10.5 K/uL 27.2(H) 17.3(H) 33.6(H)  Hemoglobin 13.0 - 17.0 g/dL 8.7(L) 8.7(L) 8.6(L)  Hematocrit 39 - 52 % 26.2(L) 26.8(L) 26.7(L)  Platelets 150 - 400 K/uL 611(H) 481(H) 1,556(HH)    CMP Latest Ref Rng & Units 06/30/2020 03/26/2020 01/24/2020  Glucose 70 - 99 mg/dL 143(H) 119(H) 138(H)  BUN 8 - 23 mg/dL 26(H) 23 25(H)  Creatinine 0.61 - 1.24 mg/dL 1.56(H) 1.51(H) 1.57(H)  Sodium 135 - 145 mmol/L 139 141 138  Potassium 3.5 - 5.1  mmol/L 4.8 4.4 4.4  Chloride 98 - 111 mmol/L 107 108 107  CO2 22 - 32 mmol/L _0 Calcium 8.9 - 10.3 mg/dL 9.5 8.9 8.5(L)  Total Protein 6.5 - 8.1 g/dL 6.5 6.6 6.4(L)  Total Bilirubin 0.3 - 1.2 mg/dL 0.3 0.4 0.3  Alkaline Phos 38 - 126 U/L 46 53 48  AST 15 - 41 U/L _1 ALT 0 - 44 U/L _2 RADIOGRAPHIC STUDIES: No results found.  ASSESSMENT & PLAN Patrick Rodriguez. 86 y.o. male with medical history significant for leukocytosis, thrombocytosis, and anemia presents for a follow up visit. His lab findings and bone marrow biopsy are consistent with MDS vs NOS MPN.   On exam today Mr. Radde is at baseline with no transfusions over the last several months.  He reports he has had no episodes of bleeding, bruising, and his energy levels are "reasonable".  He is agreeable to continuing with every 4 week monitoring and an 8-week follow-up visit.  Bone marrow biopsy performed on 11/26/2019 showed a hypercellular marrow consistent with MDS versus MPN with 9% blasts.  MDS panel was negative for definitive mutation at that time. Additional workup showed no JAK2, CALR, MPL, or BCR/ABL. He does carry 2 mutations  U2AF1 and SRSF2 which are of unclear clinical significance  We previously discussed that our working diagnosis at this time is MDS with some component of MPN.  We talked about treatments for MDS, however the patient noted that he would not want any chemotherapy regimens.  I did briefly note that hypomethylation therapy is not technically chemotherapy, but he declined these IV treatments at this time. On further discussion we talked about the nature of his disease and the fact that he carries 2 nonclinical mutations but no targetable mutations.  We also discussed that this could be treated as an MDS with hypomethylating therapy, but he noted that that  is not something that he would want to consider at this time.  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    Given his advanced age I would agree that supportive therapy with transfusions would not be an unreasonable option if this was consistent with his goals of care. We can also consider erythropoetin therapy if the frequency of blood transfusions increases to >2 per month, though with his other elevated blood findings that may not be advisable.   #Myelodysplastic Syndrome/ NOS Myeloproliferative Syndrome --bone marrow biopsy on 11/26/2019 showed hypercellular marrow with concern for MDS vs MPN. MDS panel negative for definitive mutation and canonical mutations such as JAK2, CALR, MPL, and BCR/ABL were negative. He does carry 2 mutations  U2AF1 and SRSF2 which are of unclear clinical significance --discussed the findings with the patient. He was agreeable to a comfort based approach where we focus on keeping his Hgb elevated to prevent symptoms. He did not wish to proceed with any hypomethylators/chemotherapy based treatments.  --no indication for blood transfusion based on Monday's labs, we will have patient return monthly for lab check CBC and possible transfusion.  --RTC in approximately 2 months to reassess or sooner if indicated by his blood work.   All questions were answered. The patient knows to call the clinic with any problems, questions or concerns.  A total of more than 30 minutes were spent on this encounter and over half of that time was spent on counseling and coordination of care as outlined above.   Ledell Peoples, MD Department of Hematology/Oncology [place] [facility] Phone: [phone] Pager: [phone] Email: [email or web address]  08/28/2020 12:46 PM

## 2020-08-28 NOTE — Telephone Encounter (Signed)
Scheduled appointments per 9/23 los. Gave patient updated calendar.

## 2020-09-01 ENCOUNTER — Telehealth: Payer: Self-pay

## 2020-09-01 NOTE — Telephone Encounter (Signed)
Discussed biopsy results with pt,  

## 2020-09-01 NOTE — Telephone Encounter (Signed)
-----   Message from Ralene Bathe, MD sent at 08/29/2020  5:54 PM EDT ----- Skin , left lat elbow SQUAMOUS CELL CARCINOMA IN SITU  Cancer - SCC in situ Superficial Already treated Recheck next visit

## 2020-09-02 ENCOUNTER — Encounter: Payer: Self-pay | Admitting: Internal Medicine

## 2020-09-02 ENCOUNTER — Ambulatory Visit: Payer: Medicare Other | Admitting: Internal Medicine

## 2020-09-02 ENCOUNTER — Other Ambulatory Visit: Payer: Self-pay

## 2020-09-02 DIAGNOSIS — J449 Chronic obstructive pulmonary disease, unspecified: Secondary | ICD-10-CM | POA: Diagnosis not present

## 2020-09-02 NOTE — Patient Instructions (Addendum)
I very strongly recommend you get the moderna or pfizer vaccine as soon as possible based on your risk of dying from the virus  and the proven safety and benefit of these vaccines against even the delta variant.  This can save your life as well as  those of your loved ones,  especially if they are also not vaccinated.   Only use your albuterol as a rescue medication to be used if you can't catch your breath by resting or doing a relaxed purse lip breathing pattern.  - The less you use it, the better it will work when you need it. - Ok to use up to 2 puffs  every 4 hours if you must but call for immediate appointment if use goes up over your usual need - Don't leave home without it !!  (think of it like the spare tire for your car)   Try albuterol 15 min before an activity that you know would make you short of breath and see if it makes any difference and if makes none then don't take it after activity unless you can't catch your breath.     If albuterol helps you do more, then I would start back on dulera 100 Take 2 puffs first thing in am and then another 2 puffs about 12 hours later.   Pulmonary follow up is as needed

## 2020-09-02 NOTE — Progress Notes (Signed)
Subjective:     Patient ID: Patrick Rodriguez, male   DOB: January 15, 1925    MRN: [id number]    Brief patient profile:  89 yowm quit smoking 1970 with documented  copd on prn symbiocrt 160 2bid due to severe hoarseness only  GOLD I on f/u pfts 01/14/2015    History of Present Illness  12/03/13 ov/Patrick Rodriguez  Abruptly more sob with exertion since acute GIB x one month prior to OV assoc with fatigue  And more cough x one - two weeks assoc with noct wheeze so started symbicort 160 2 bid but more hoarse since. Sob with more than slow adls, some better p restarted symbicort, some better with saba. rec Add pepcid 20 mg ac at bedtime as long as having night time symptoms (available over the counter) Stop symbicort  Start dulera 100 Take 2 puffs first thing in am and then another 2 puffs about 12 hours later.  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 Follow-up    cough clear SOB  Not limited by breathing from desired activities  Including treadmill 3 x weekly  Sleep ok unless obvious gerd once or twice weekly > cough  More than sob  rec Bed blocks would be a good idea Prilosec Take 30-60 min before first meal of the day  Pepcid 20 mg one at bedtime Dulera 100 1-2 pffs every 12 hours if needed for breathing/ coughing  GERD diet     01/14/2015 f/u ov/Patrick Rodriguez re: GOLD I copd  Chief Complaint  Patient presents with  . Follow-up    PFT done today. Breathing is doing well. No new co's today.   Not limited by breathing from desired activities   Rarely using dulera / never saba  rec You do not have significant copd now and so never will There may be some mild asthma present for which dulera taken up to 2 puffs evey 12 hours should be adequate whenever you have cough/ short of breath Most of your symptoms are likely reflux related for which diet/ wt loss are the most  important treatments     12/13/2017 acute extended ov/Patrick Rodriguez re:  GOLD I copd  Chief Complaint  Patient presents with  . Acute Visit    Pt c/o cough and SOB for the past 6 days. He went to [facility] and was told he had bronchitis and was given zpack, proair and promethazine cough syrup. He states he is not really improving much. He is coughing with clear sputum, wheezing and also has some chest tightness.   not needing dulera very several years Diet/ wt loss / no elevation / taking prilosec 20 mg at breakfast  Acutely ill x 6 days started with nasal congestion and pressure over your frontal sinuses which improved and clear mucus  Sore throat better.  Wheeze worse at hs / better with plm  Or proair for a few hours  rec Finish your zpak and take Prednisone 10 mg take  4 each am x 2 days,   2 each am x 2 days,  1 each am x 2 days and stop  For wheeze/ congestion/ short of breath >  dulera 100 up to 12 hours as needed  Work on inhaler technique:  Only use your albuterol [facility]) is a rescue medication When you have  any resp flare > Prilosec 40 mg Take 30- 60 min before your first and last meals of the day until better then resume previous dose GERD  Diet  If not 100% >>> f/u in 2 weeks   09/02/2020  f/u ov/Patrick Rodriguez re:  GOLD I  COPD  Not on any inhalers /on dupixent from derm/ not vaccinated for covid 18  Chief Complaint  Patient presents with  . Follow-up    short of breath only with heavy exertion  Dyspnea: MMRC1 = can walk nl pace, flat grade, can't hurry or go uphills or steps s sob   Cough: gone  Sleeping: able to lie flat ok SABA use: not using  02: none    No obvious day to day or daytime variability or assoc excess/ purulent sputum or mucus plugs or hemoptysis or cp or chest tightness, subjective wheeze or overt sinus or hb symptoms.   Sleeping  without nocturnal  or early am exacerbation  of respiratory  c/o's or need for noct saba. Also denies any obvious fluctuation of symptoms with  weather or environmental changes or other aggravating or alleviating factors except as outlined above   No unusual exposure hx or h/o childhood pna/ asthma or knowledge of premature birth.  Current Allergies, Complete Past Medical History, Past Surgical History, Family History, and Social History were reviewed in [employer] record.  ROS  The following are not active complaints unless bolded Hoarseness, sore throat, dysphagia, dental problems, itching, sneezing,  nasal congestion or discharge of excess mucus or purulent secretions, ear ache,   fever, chills, sweats, unintended wt loss or wt gain, classically pleuritic or exertional cp,  orthopnea pnd or arm/hand swelling  or leg swelling, presyncope, palpitations, abdominal pain, anorexia, nausea, vomiting, diarrhea  or change in bowel habits or change in bladder habits, change in stools or change in urine, dysuria, hematuria,  rash, arthralgias, visual complaints, headache, numbness, weakness or ataxia or problems with walking or coordination,  change in mood or  memory.        Current Meds  Medication Sig  . amLODipine (NORVASC) 10 MG tablet TAKE 1 TABLET BY MOUTH DAILY  . Ascorbic Acid (VITAMIN C) 1000 MG tablet Take 1,000 mg by mouth daily.    Marland Kitchen aspirin 81 MG tablet Take 81 mg by mouth daily.   Marland Kitchen b complex vitamins tablet Take 1 tablet by mouth daily.    Marland Kitchen doxazosin (CARDURA) 4 MG tablet TAKE 1 TABLET BY MOUTH DAILY  . Dupilumab (DUPIXENT [place]) Inject into the skin every 14 (fourteen) days.  . DUPIXENT 300 MG/2ML SOPN   . magnesium gluconate (MAGONATE) 500 MG tablet Take 500 mg by mouth daily.    . metoprolol succinate (TOPROL XL) 25 MG 24 hr tablet Take 1 tablet (25 mg total) by mouth daily.  . Multiple Vitamins-Minerals (ZINC PO) Take by mouth.  . Nattokinase 100 MG CAPS Take by mouth daily.    Marland Kitchen omeprazole (PRILOSEC) 40 MG capsule Take 40 mg by mouth daily.  . tranexamic acid (LYSTEDA) 650 MG TABS tablet Take 1  tablet (650 mg total) by mouth in the morning, at noon, in the evening, and at bedtime. Dissolve 1 tablet in 10-15 ml of water, allow it to dissolve over 3-5 minutes. Swish in mouth x 2 minutes then spit. Repeat every 6 hours until bleeding stops, then continue for another 24 hours.  . TURMERIC PO Take by mouth.   Current Facility-Administered Medications for the 09/02/20 encounter (  Office Visit) with Tanda Rockers, MD  Medication  . Dupilumab SOPN 300 mg                     Objective:   Physical Exam  Well preserved pleasant amb wm nad     09/02/2020    154  12/13/2017       165  10/24/2014   170  > 11/11/2014  180 > 01/14/2015  180 >    12/03/13 190 lb (86.183 kg)  11/21/13 185 lb (83.915 kg)  11/15/13 178 lb (80.74 kg)      Vital signs reviewed  09/02/2020  - Note at rest 02 sats 96 % on RA       HEENT : pt wearing mask not removed for exam due to covid - 19 concerns.   NECK :  without JVD/Nodes/TM/ nl carotid upstrokes bilaterally   LUNGS: no acc muscle use,  Min barrel  contour chest wall with bilateral  slightly decreased bs s audible wheeze and  without cough on insp or exp maneuvers and min  Hyperresonant  to  percussion bilaterally     CV:  RRR  no s3 or murmur or increase in P2, and no edema   ABD:  soft and nontender with pos end  insp Hoover's  in the supine position. No bruits or organomegaly appreciated, bowel sounds nl  MS:   Nl gait/  ext warm without deformities, calf tenderness, cyanosis or clubbing No obvious joint restrictions   SKIN: warm and dry without lesions    NEURO:  alert, approp, nl sensorium with  no motor or cerebellar deficits apparent.           cxr July 2021 per pt showed copd only by pt report   Assessment:

## 2020-09-03 ENCOUNTER — Encounter: Payer: Self-pay | Admitting: Internal Medicine

## 2020-09-03 NOTE — Assessment & Plan Note (Addendum)
Quit smoking 1970 Spi[place] 2009:  FEV1 1.72 (64%), ratio 52  - poorly tolerant to ICS due to dysphonia, even with rinsing and spacer    10/24/2014 p extensive coaching HFA effectiveness =    75% > prn dulera 100 rec - PFT's  FEV1 2.44 (106%) ratio 68 no change p saba off dulera x weeks/ dlco 51% corrects to 61 with alv vol  - 12/13/2017  After extensive coaching inhaler device  effectiveness =    75% > resume dulera 100 up to 2 every 12 h as needed - not using any inhalers as of 09/02/2020 but maint on dupixent by derm    Group A in terms of symptom/risk and needs to keep saba on hand for prn use and if it goes up over baseline for more than a few weeks (eg for URI) then restart the laba/ics he already has (dulera as prev rec)  But now that on dupixent from derm this is less likely to be needed   Re saba: I spent extra time with pt today reviewing appropriate use of albuterol for prn use on exertion with the following points: 1) saba is for relief of sob that does not improve by walking a slower pace or resting but rather if the pt does not improve after trying this first. 2) If the pt is convinced, as many are, that saba helps recover from activity faster then it's easy to tell if this is the case by re-challenging : ie stop, take the inhaler, then p 5 minutes try the exact same activity (intensity of workload) that just caused the symptoms and see if they are substantially diminished or not after saba 3) if there is an activity that reproducibly causes the symptoms, try the saba 15 min before the activity on alternate days   If in fact the saba really does help, then fine to continue to use it prn but advised may need to look closer at the maintenance regimen being used to achieve better control of airways disease with exertion (ie dulera option as before)   Re covid 19: Pt informed of the seriousness of COVID 19 infection as a direct risk to lung health  and safey and to close contacts and should  continue to wear a facemask in public and minimize exposure to public locations but especially avoid any area or activity where non-close contacts are not observing distancing or wearing an appropriate face mask.  I strongly recommended she take either of the vaccines available through local drugstores based on updated information on millions of Americans treated with the [place] products  which have proven both safe and  effective even against the new delta variant.    Pulmonary f/u Is prn          Each maintenance medication was reviewed in detail including emphasizing most importantly the difference between maintenance and prns and under what circumstances the prns are to be triggered using an action plan format where appropriate.  Total time for H and P, chart review, counseling, teaching device and generating customized AVS unique to this office visit / charting = 31 min     3

## 2020-09-04 ENCOUNTER — Telehealth: Payer: Self-pay | Admitting: Internal Medicine

## 2020-09-04 DIAGNOSIS — J449 Chronic obstructive pulmonary disease, unspecified: Secondary | ICD-10-CM

## 2020-09-04 MED ORDER — MOMETASONE FURO-FORMOTEROL FUM 100-5 MCG/ACT IN AERO: 2.0000 | INHALATION_SPRAY | Freq: Two times a day (BID) | RESPIRATORY_TRACT | 11 refills | Status: DC

## 2020-09-04 NOTE — Telephone Encounter (Signed)
Spoke with the pt and notified of recs per MW  He verbalized understanding  Rx for Ruthe Mannan was sent to [facility]  Nothing further needed

## 2020-09-04 NOTE — Telephone Encounter (Signed)
Patient requesting prescription for [employer]. Office visit note from 09/02/2020 states, "If albuterol helps you do more, then I would start back on dulera 100 Take 2 puffs first thing in am and then another 2 puffs about 12 hours later." Please advise if triage can order this.

## 2020-09-04 NOTE — Telephone Encounter (Signed)
Ok dulera 100 Take 2 puffs first thing in am and then another 2 puffs about 12 hours later.  X 11 refills   and change to albuterol to prn:  Only use your albuterol as a rescue medication to be used if you can't catch your breath by resting or doing a relaxed purse lip breathing pattern.  - The less you use it, the better it will work when you need it. - Ok to use up to 2 puffs  every 4 hours if you must but call for immediate appointment if use goes up over your usual need

## 2020-09-25 ENCOUNTER — Other Ambulatory Visit: Payer: Self-pay

## 2020-09-25 ENCOUNTER — Telehealth: Payer: Self-pay | Admitting: Cardiology

## 2020-09-25 ENCOUNTER — Inpatient Hospital Stay: Payer: Medicare Other

## 2020-09-25 ENCOUNTER — Telehealth: Payer: Self-pay | Admitting: *Deleted

## 2020-09-25 ENCOUNTER — Inpatient Hospital Stay: Payer: Medicare Other | Attending: Hematology and Oncology

## 2020-09-25 DIAGNOSIS — D469 Myelodysplastic syndrome, unspecified: Secondary | ICD-10-CM | POA: Diagnosis not present

## 2020-09-25 LAB — CBC WITH DIFFERENTIAL (CANCER CENTER ONLY)
Abs Immature Granulocytes: 4.46 10*3/uL — ABNORMAL HIGH (ref 0.00–0.07)
Basophils Absolute: 0.1 10*3/uL (ref 0.0–0.1)
Basophils Relative: 0 %
Eosinophils Absolute: 0.8 10*3/uL — ABNORMAL HIGH (ref 0.0–0.5)
Eosinophils Relative: 2 %
HCT: 24.6 % — ABNORMAL LOW (ref 39.0–52.0)
Hemoglobin: 8.2 g/dL — ABNORMAL LOW (ref 13.0–17.0)
Immature Granulocytes: 14 %
Lymphocytes Relative: 6 %
Lymphs Abs: 1.9 10*3/uL (ref 0.7–4.0)
MCH: 29.8 pg (ref 26.0–34.0)
MCHC: 33.3 g/dL (ref 30.0–36.0)
MCV: 89.5 fL (ref 80.0–100.0)
Monocytes Absolute: 1.3 10*3/uL — ABNORMAL HIGH (ref 0.1–1.0)
Monocytes Relative: 4 %
Neutro Abs: 23.6 10*3/uL — ABNORMAL HIGH (ref 1.7–7.7)
Neutrophils Relative %: 74 %
Platelet Count: 893 10*3/uL — ABNORMAL HIGH (ref 150–400)
RBC: 2.75 MIL/uL — ABNORMAL LOW (ref 4.22–5.81)
RDW: 21.8 % — ABNORMAL HIGH (ref 11.5–15.5)
WBC Count: 32.1 10*3/uL — ABNORMAL HIGH (ref 4.0–10.5)
nRBC: 0.1 % (ref 0.0–0.2)

## 2020-09-25 LAB — SAMPLE TO BLOOD BANK

## 2020-09-25 NOTE — Telephone Encounter (Signed)
Patient c/o Palpitations:  High priority if patient c/o lightheadedness, shortness of breath, or chest pain  1) How long have you had palpitations/irregular HR/ Afib? Are you having the symptoms now? Yes, 3 or 4 years, but they are getting worse  2) Are you currently experiencing lightheadedness, SOB or CP? no  3) Do you have a history of afib (atrial fibrillation) or irregular heart rhythm? Yes, but getting worse  4) Have you checked your BP or HR? (document readings if available): 140/57 HR 70  5) Are you experiencing any other symptoms? no   Patient states he has had an irregular heart rhythm for 3 or 4 years, but states it has been getting worse. He states he also gets lightheaded and some SOB, but not currently. Patient did not sound SOB on the phone. He states he gets SOB when he moves around. He has an appointment 10/27/2020, but would like to know if he can be seen sooner. He does not want to see a PA.

## 2020-09-25 NOTE — Telephone Encounter (Signed)
Spoke with pt and has noted increase in palpitations and SOB with activity Per pt has stopped Cardura a couple of days ago and B/P was 140/? HR in the 70's Pt is requesting an earlier appt to see Dr Martinique Pt has appt in late  November but wants something prior to that and refuses to see APP  .Reviewed pt's chart and pt is anemic and also was recently dx with COPD Will forward to Dr Martinique for review and recommendations ./cy

## 2020-09-25 NOTE — Telephone Encounter (Signed)
Met with patient in the infusion room to discuss his lab results with him. HGB is 8.2 this am. He states he feels like he usually does, and doesn't think he needs any blood today. He states he will call if anything changes and we will re-check his labs next month as scheduled. Heidi, RN infusion room nurse aware that pt will not need transfusion today.Patrick Rodriguez

## 2020-09-25 NOTE — Progress Notes (Signed)
Patient opted not to receive blood today.

## 2020-09-26 ENCOUNTER — Telehealth: Payer: Self-pay | Admitting: *Deleted

## 2020-09-26 ENCOUNTER — Other Ambulatory Visit: Payer: Self-pay | Admitting: *Deleted

## 2020-09-26 DIAGNOSIS — D469 Myelodysplastic syndrome, unspecified: Secondary | ICD-10-CM

## 2020-09-26 LAB — PREPARE RBC (CROSSMATCH)

## 2020-09-26 NOTE — Telephone Encounter (Signed)
Received call from pt. He states he is feeling weaker today, not as good as yesterday. He is asking if he can still get a unit of blood that was scheduled for yesterday. Advised that he can as long as he has the blue wrist band he was given yesterday. He states he still has it. Advised that he can come tomorrow for blood. Asked to arrive by 9am.  Transfusion orders placed and spoke with blood bank. They will have his blood ready for tomorrow.

## 2020-09-26 NOTE — Telephone Encounter (Signed)
Spoke to patient appointment scheduled with Dr.Jordan 11/17 at 8:40 am.

## 2020-09-26 NOTE — Telephone Encounter (Signed)
I would work him in for 8:40 on Nov 17. Really don't have anything prior to that.  Telly Jawad Martinique MD, [facility]

## 2020-09-27 ENCOUNTER — Inpatient Hospital Stay: Payer: Medicare Other

## 2020-09-27 ENCOUNTER — Other Ambulatory Visit: Payer: Self-pay

## 2020-09-27 DIAGNOSIS — D469 Myelodysplastic syndrome, unspecified: Secondary | ICD-10-CM

## 2020-09-27 MED ORDER — ACETAMINOPHEN 325 MG PO TABS
650.0000 mg | ORAL_TABLET | Freq: Once | ORAL | Status: AC
  Administered 2020-09-27: 650 mg via ORAL

## 2020-09-27 MED ORDER — ACETAMINOPHEN 325 MG PO TABS
ORAL_TABLET | ORAL | Status: AC
  Filled 2020-09-27: qty 2

## 2020-09-27 MED ORDER — SODIUM CHLORIDE 0.9% IV SOLUTION
250.0000 mL | Freq: Once | INTRAVENOUS | Status: AC
  Administered 2020-09-27: 250 mL via INTRAVENOUS
  Filled 2020-09-27: qty 250

## 2020-09-27 NOTE — Patient Instructions (Signed)

## 2020-09-28 LAB — TYPE AND SCREEN
ABO/RH(D): A POS
Antibody Screen: NEGATIVE
Unit division: 0

## 2020-09-28 LAB — BPAM RBC
Blood Product Expiration Date: 202111062359
ISSUE DATE / TIME: 202110230828
Unit Type and Rh: 6200

## 2020-10-18 NOTE — Progress Notes (Deleted)
Cardiology Office Note    Date:  10/18/2020   ID:  Patrick Rodriguez., DOB June 16, 1925, MRN [id number]  PCP:  Maury Dus, MD  Cardiologist:  Dr. Martinique   No chief complaint on file.   History of Present Illness:  Patrick Rodriguez. is a 86 y.o. male with PMH of HTN, HLD, carotid artery disease, and CAD.  Patient had a syncope in 2013 which was felt to be due to dehydration.  HCTZ was stopped at the time he also had hyponatremia.  Stress Myoview in January 2015 was normal with EF 70%.  Heart monitor showed PVCs and PACs but no significant ventricular ectopy.  Due to history of anemia, he underwent a GI evaluation that demonstrated polyps and diverticulosis.  He has been diagnosed with myelodysplasia and is followed by oncology. Gets transfusions about every other month.   He has recently complained of palpitations. Found to be anemic secondary to myelodysplasia and was transfused. Also diagnosed with COPD.      Past Medical History:  Diagnosis Date  . Allergic rhinitis   . Basal cell carcinoma 01/07/2020   left preauricular  . Basal cell carcinoma 06/25/2020   right tip of nose  . Carotid artery stenosis    12/2011:  50-69% on the left and 1-49% on the right;  Carotid US (11/2013): Bilateral 40-59%; repeat 1 year  . Cataract   . Coronary artery disease    a.  s/p stenting of the LAD in 2000;  b. Lexiscan Myoview (12/2013): No ischemia, EF 70%; normal study  . ED (erectile dysfunction)   . GERD (gastroesophageal reflux disease)   . Hyperlipidemia   . Hyperplastic colon polyp 11/15/2013   x3  . Hypertension   . Myocardial infarction ([place])   . Normal echocardiogram Jan 2013   EF of 55% and no wall motion abnormalities  . Normal nuclear stress test 2008  . Obesity   . Palpitations    Holter (11/2013): NSR, sinus brady, PVCs, rare blocked PAC, no sig arrhythmia  . Squamous cell carcinoma of skin 01/07/2020   right lat elbow  . Substance abuse ([place])   . Syncope Jan 2013   felt  to be vasovagal; had normal echo, carotids ok, 1st degree AV block with mention (but no tracing) of 2nd degree type I AV block while in [place]  . Tubular adenoma 11/15/2013   x2    Past Surgical History:  Procedure Laterality Date  . APPENDECTOMY    . CARDIAC CATHETERIZATION  01/20/1999   EF 60%  . CARDIOVASCULAR STRESS TEST  04/19/2007   EF 74%  . CORONARY STENT PLACEMENT  2000   LAD  . DOPPLER ECHOCARDIOGRAPHY  02/06/1998   EF 60%  . TONSILLECTOMY      Current Medications: Outpatient Medications Prior to Visit  Medication Sig Dispense Refill  . amLODipine (NORVASC) 10 MG tablet TAKE 1 TABLET BY MOUTH DAILY 90 tablet 3  . Ascorbic Acid (VITAMIN C) 1000 MG tablet Take 1,000 mg by mouth daily.      Marland Kitchen aspirin 81 MG tablet Take 81 mg by mouth daily.     Marland Kitchen b complex vitamins tablet Take 1 tablet by mouth daily.      Marland Kitchen doxazosin (CARDURA) 4 MG tablet TAKE 1 TABLET BY MOUTH DAILY 90 tablet 1  . Dupilumab (DUPIXENT [place]) Inject into the skin every 14 (fourteen) days.    . DUPIXENT 300 MG/2ML SOPN     . magnesium gluconate (MAGONATE)  500 MG tablet Take 500 mg by mouth daily.      . metoprolol succinate (TOPROL XL) 25 MG 24 hr tablet Take 1 tablet (25 mg total) by mouth daily. 90 tablet 3  . mometasone-formoterol (DULERA) 100-5 MCG/ACT AERO Inhale 2 puffs into the lungs every 12 (twelve) hours. 1 each 11  . Multiple Vitamins-Minerals (ZINC PO) Take by mouth.    . Nattokinase 100 MG CAPS Take by mouth daily.      Marland Kitchen omeprazole (PRILOSEC) 40 MG capsule Take 40 mg by mouth daily.    . tranexamic acid (LYSTEDA) 650 MG TABS tablet Take 1 tablet (650 mg total) by mouth in the morning, at noon, in the evening, and at bedtime. Dissolve 1 tablet in 10-15 ml of water, allow it to dissolve over 3-5 minutes. Swish in mouth x 2 minutes then spit. Repeat every 6 hours until bleeding stops, then continue for another 24 hours. 20 tablet 0  . TURMERIC PO Take by mouth.     Facility-Administered Medications Prior to  Visit  Medication Dose Route Frequency Provider Last Rate Last Admin  . Dupilumab SOPN 300 mg  300 mg Subcutaneous Q14 Days Ralene Bathe, MD   300 mg at 06/04/20 1359     Allergies:   Hctz [hydrochlorothiazide]   Social History   Socioeconomic History  . Marital status: Married    Spouse name: Not on file  . Number of children: Y  . Years of education: Not on file  . Highest education level: Not on file  Occupational History  . Occupation: traveling paster  Tobacco Use  . Smoking status: Former Smoker    Packs/day: 2.00    Years: 28.00    Pack years: 56.00    Types: Cigarettes    Quit date: 12/06/1968    Years since quitting: 51.9  . Smokeless tobacco: Never Used  Vaping Use  . Vaping Use: Never used  Substance and Sexual Activity  . Alcohol use: No    Alcohol/week: 0.0 standard drinks  . Drug use: No  . Sexual activity: Not on file  Other Topics Concern  . Not on file  Social History Narrative  . Not on file   Social Determinants of Health   Financial Resource Strain:   . Difficulty of Paying Living Expenses: Not on file  Food Insecurity:   . Worried About Charity fundraiser in the Last Year: Not on file  . Ran Out of Food in the Last Year: Not on file  Transportation Needs:   . Lack of Transportation (Medical): Not on file  . Lack of Transportation (Non-Medical): Not on file  Physical Activity:   . Days of Exercise per Week: Not on file  . Minutes of Exercise per Session: Not on file  Stress:   . Feeling of Stress : Not on file  Social Connections:   . Frequency of Communication with Friends and Family: Not on file  . Frequency of Social Gatherings with Friends and Family: Not on file  . Attends Religious Services: Not on file  . Active Member of Clubs or Organizations: Not on file  . Attends Archivist Meetings: Not on file  . Marital Status: Not on file     Family History:  The patient's family history includes Aneurysm in his mother;  Cancer in his father and sister; Stomach cancer in his father.   ROS:   Please see the history of present illness.    ROS All  other systems reviewed and are negative.   PHYSICAL EXAM:   VS:  There were no vitals taken for this visit.   GEN: Well nourished, well developed, in no acute distress  HEENT: normal  Neck: no JVD, carotid bruits, or masses Cardiac: RRR; no murmurs, rubs, or gallops,no edema  Respiratory:  clear to auscultation bilaterally, normal work of breathing GI: soft, nontender, nondistended, + BS MS: no deformity or atrophy  Skin: warm and dry, no rash Neuro:  Alert and Oriented x 3, Strength and sensation are intact Psych: euthymic mood, full affect  Wt Readings from Last 3 Encounters:  09/02/20 154 lb 6.4 oz (70 kg)  08/28/20 153 lb 1.6 oz (69.4 kg)  06/30/20 152 lb 8 oz (69.2 kg)      Studies/Labs Reviewed:   EKG:  EKG is ordered today.  The ekg ordered today demonstrates NSR with first degree AV block. I have personally reviewed and interpreted this study.   Recent Labs: 11/07/2019: TSH 2.401 06/30/2020: ALT 15; BUN 26; Creatinine 1.56; Potassium 4.8; Sodium 139 09/25/2020: Hemoglobin 8.2; Platelet Count 893   Lipid Panel    Component Value Date/Time   CHOL 157 07/05/2013 0915   TRIG 46.0 07/05/2013 0915   HDL 38.90 (L) 07/05/2013 0915   CHOLHDL 4 07/05/2013 0915   VLDL 9.2 07/05/2013 0915   LDLCALC 109 (H) 07/05/2013 0915   Dated 08/07/19: cholesterol 181, triglycerides 60, HDL 50, LDL 119.   Additional studies/ records that were reviewed today include:   Normal Myoview on 12/12/2013   ASSESSMENT:    No diagnosis found.   PLAN:  In order of problems listed above:  1. Hypertension: BP still not ideal. On Cardura and amlodipine. Will add Toprol XL 25 mg daily. Avoid diuretics/ARB,ACEi due to CKD.  I will continue on his current medication.  2. CAD: Denies any anginal symptoms.  Continue aspirin  3. Hyperlipidemia: Continue red yeast  rice.  4. Carotid arterial disease. 31-51% LICA   Medication Adjustments/Labs and Tests Ordered: Current medicines are reviewed at length with the patient today.  Concerns regarding medicines are outlined above.  Medication changes, Labs and Tests ordered today are listed in the Patient Instructions below. There are no Patient Instructions on file for this visit.   Signed, Shiquan Mathieu Martinique, MD  10/18/2020 3:22 PM    [facility] [place], [place], [place]  [address] Phone: [phone]; Fax: [phone]

## 2020-10-20 ENCOUNTER — Emergency Department (HOSPITAL_COMMUNITY): Payer: Medicare Other

## 2020-10-20 ENCOUNTER — Inpatient Hospital Stay (HOSPITAL_COMMUNITY)
Admission: EM | Admit: 2020-10-20 | Discharge: 2020-10-22 | DRG: 064 | Disposition: A | Discharge status: Home Health | Payer: Medicare Other | Attending: Internal Medicine | Admitting: Internal Medicine

## 2020-10-20 DIAGNOSIS — Z85828 Personal history of other malignant neoplasm of skin: Secondary | ICD-10-CM | POA: Diagnosis not present

## 2020-10-20 DIAGNOSIS — Z7982 Long term (current) use of aspirin: Secondary | ICD-10-CM

## 2020-10-20 DIAGNOSIS — I2510 Atherosclerotic heart disease of native coronary artery without angina pectoris: Secondary | ICD-10-CM | POA: Diagnosis present

## 2020-10-20 DIAGNOSIS — D649 Anemia, unspecified: Secondary | ICD-10-CM | POA: Diagnosis present

## 2020-10-20 DIAGNOSIS — R000 Tachycardia, unspecified: Secondary | ICD-10-CM | POA: Diagnosis not present

## 2020-10-20 DIAGNOSIS — R29818 Other symptoms and signs involving the nervous system: Secondary | ICD-10-CM | POA: Diagnosis not present

## 2020-10-20 DIAGNOSIS — E7800 Pure hypercholesterolemia, unspecified: Secondary | ICD-10-CM | POA: Diagnosis not present

## 2020-10-20 DIAGNOSIS — I63011 Cerebral infarction due to thrombosis of right vertebral artery: Secondary | ICD-10-CM | POA: Diagnosis not present

## 2020-10-20 DIAGNOSIS — I639 Cerebral infarction, unspecified: Secondary | ICD-10-CM

## 2020-10-20 DIAGNOSIS — D469 Myelodysplastic syndrome, unspecified: Secondary | ICD-10-CM | POA: Diagnosis not present

## 2020-10-20 DIAGNOSIS — K219 Gastro-esophageal reflux disease without esophagitis: Secondary | ICD-10-CM | POA: Diagnosis not present

## 2020-10-20 DIAGNOSIS — I6340 Cerebral infarction due to embolism of unspecified cerebral artery: Secondary | ICD-10-CM | POA: Insufficient documentation

## 2020-10-20 DIAGNOSIS — D72829 Elevated white blood cell count, unspecified: Secondary | ICD-10-CM

## 2020-10-20 DIAGNOSIS — R4702 Dysphasia: Secondary | ICD-10-CM | POA: Diagnosis not present

## 2020-10-20 DIAGNOSIS — Z87891 Personal history of nicotine dependence: Secondary | ICD-10-CM

## 2020-10-20 DIAGNOSIS — R29898 Other symptoms and signs involving the musculoskeletal system: Secondary | ICD-10-CM | POA: Diagnosis not present

## 2020-10-20 DIAGNOSIS — R7303 Prediabetes: Secondary | ICD-10-CM | POA: Diagnosis present

## 2020-10-20 DIAGNOSIS — R531 Weakness: Secondary | ICD-10-CM | POA: Diagnosis not present

## 2020-10-20 DIAGNOSIS — J9601 Acute respiratory failure with hypoxia: Secondary | ICD-10-CM | POA: Diagnosis present

## 2020-10-20 DIAGNOSIS — D471 Chronic myeloproliferative disease: Secondary | ICD-10-CM | POA: Diagnosis present

## 2020-10-20 DIAGNOSIS — R4701 Aphasia: Secondary | ICD-10-CM | POA: Diagnosis not present

## 2020-10-20 DIAGNOSIS — Z20822 Contact with and (suspected) exposure to covid-19: Secondary | ICD-10-CM | POA: Diagnosis present

## 2020-10-20 DIAGNOSIS — Z955 Presence of coronary angioplasty implant and graft: Secondary | ICD-10-CM

## 2020-10-20 DIAGNOSIS — I6312 Cerebral infarction due to embolism of basilar artery: Secondary | ICD-10-CM

## 2020-10-20 DIAGNOSIS — I63512 Cerebral infarction due to unspecified occlusion or stenosis of left middle cerebral artery: Secondary | ICD-10-CM | POA: Diagnosis not present

## 2020-10-20 DIAGNOSIS — G9389 Other specified disorders of brain: Secondary | ICD-10-CM | POA: Diagnosis not present

## 2020-10-20 DIAGNOSIS — I340 Nonrheumatic mitral (valve) insufficiency: Secondary | ICD-10-CM | POA: Diagnosis not present

## 2020-10-20 DIAGNOSIS — D696 Thrombocytopenia, unspecified: Secondary | ICD-10-CM | POA: Diagnosis present

## 2020-10-20 DIAGNOSIS — J449 Chronic obstructive pulmonary disease, unspecified: Secondary | ICD-10-CM | POA: Diagnosis present

## 2020-10-20 DIAGNOSIS — I252 Old myocardial infarction: Secondary | ICD-10-CM | POA: Diagnosis not present

## 2020-10-20 DIAGNOSIS — I5032 Chronic diastolic (congestive) heart failure: Secondary | ICD-10-CM | POA: Diagnosis not present

## 2020-10-20 DIAGNOSIS — E785 Hyperlipidemia, unspecified: Secondary | ICD-10-CM | POA: Diagnosis present

## 2020-10-20 DIAGNOSIS — R4781 Slurred speech: Secondary | ICD-10-CM | POA: Diagnosis not present

## 2020-10-20 DIAGNOSIS — I10 Essential (primary) hypertension: Secondary | ICD-10-CM | POA: Diagnosis not present

## 2020-10-20 DIAGNOSIS — Z800 Family history of malignant neoplasm of digestive organs: Secondary | ICD-10-CM | POA: Diagnosis not present

## 2020-10-20 DIAGNOSIS — Z79899 Other long term (current) drug therapy: Secondary | ICD-10-CM

## 2020-10-20 DIAGNOSIS — Z66 Do not resuscitate: Secondary | ICD-10-CM | POA: Diagnosis present

## 2020-10-20 DIAGNOSIS — I342 Nonrheumatic mitral (valve) stenosis: Secondary | ICD-10-CM | POA: Diagnosis not present

## 2020-10-20 DIAGNOSIS — I6782 Cerebral ischemia: Secondary | ICD-10-CM | POA: Diagnosis not present

## 2020-10-20 DIAGNOSIS — I6523 Occlusion and stenosis of bilateral carotid arteries: Secondary | ICD-10-CM | POA: Diagnosis not present

## 2020-10-20 DIAGNOSIS — G8194 Hemiplegia, unspecified affecting left nondominant side: Secondary | ICD-10-CM | POA: Diagnosis not present

## 2020-10-20 DIAGNOSIS — R0602 Shortness of breath: Secondary | ICD-10-CM | POA: Diagnosis not present

## 2020-10-20 DIAGNOSIS — I129 Hypertensive chronic kidney disease with stage 1 through stage 4 chronic kidney disease, or unspecified chronic kidney disease: Secondary | ICD-10-CM | POA: Diagnosis present

## 2020-10-20 DIAGNOSIS — I130 Hypertensive heart and chronic kidney disease with heart failure and stage 1 through stage 4 chronic kidney disease, or unspecified chronic kidney disease: Secondary | ICD-10-CM | POA: Diagnosis not present

## 2020-10-20 DIAGNOSIS — N1832 Chronic kidney disease, stage 3b: Secondary | ICD-10-CM | POA: Diagnosis present

## 2020-10-20 DIAGNOSIS — Z888 Allergy status to other drugs, medicaments and biological substances status: Secondary | ICD-10-CM

## 2020-10-20 DIAGNOSIS — R29810 Facial weakness: Secondary | ICD-10-CM | POA: Diagnosis not present

## 2020-10-20 DIAGNOSIS — D75839 Thrombocytosis, unspecified: Secondary | ICD-10-CM | POA: Diagnosis not present

## 2020-10-20 DIAGNOSIS — R29702 NIHSS score 2: Secondary | ICD-10-CM | POA: Diagnosis present

## 2020-10-20 DIAGNOSIS — I63132 Cerebral infarction due to embolism of left carotid artery: Secondary | ICD-10-CM | POA: Diagnosis not present

## 2020-10-20 DIAGNOSIS — N183 Chronic kidney disease, stage 3 unspecified: Secondary | ICD-10-CM | POA: Diagnosis present

## 2020-10-20 HISTORY — DX: Cerebral infarction, unspecified: I63.9

## 2020-10-20 LAB — URINALYSIS, ROUTINE W REFLEX MICROSCOPIC
Bacteria, UA: NONE SEEN
Bilirubin Urine: NEGATIVE
Glucose, UA: NEGATIVE mg/dL
Hgb urine dipstick: NEGATIVE
Ketones, ur: NEGATIVE mg/dL
Leukocytes,Ua: NEGATIVE
Nitrite: NEGATIVE
Protein, ur: 100 mg/dL — AB
Specific Gravity, Urine: 1.031 — ABNORMAL HIGH (ref 1.005–1.030)
pH: 5 (ref 5.0–8.0)

## 2020-10-20 LAB — COMPREHENSIVE METABOLIC PANEL
ALT: 22 U/L (ref 0–44)
AST: 32 U/L (ref 15–41)
Albumin: 3.2 g/dL — ABNORMAL LOW (ref 3.5–5.0)
Alkaline Phosphatase: 57 U/L (ref 38–126)
Anion gap: 10 (ref 5–15)
BUN: 23 mg/dL (ref 8–23)
CO2: 22 mmol/L (ref 22–32)
Calcium: 9 mg/dL (ref 8.9–10.3)
Chloride: 106 mmol/L (ref 98–111)
Creatinine, Ser: 1.53 mg/dL — ABNORMAL HIGH (ref 0.61–1.24)
GFR, Estimated: 42 mL/min — ABNORMAL LOW (ref >60)
Glucose, Bld: 172 mg/dL — ABNORMAL HIGH (ref 70–99)
Potassium: 4 mmol/L (ref 3.5–5.1)
Sodium: 138 mmol/L (ref 135–145)
Total Bilirubin: 0.4 mg/dL (ref 0.3–1.2)
Total Protein: 6.1 g/dL — ABNORMAL LOW (ref 6.5–8.1)

## 2020-10-20 LAB — DIFFERENTIAL
Abs Immature Granulocytes: 18.79 10*3/uL — ABNORMAL HIGH (ref 0.00–0.07)
Basophils Absolute: 0.3 10*3/uL — ABNORMAL HIGH (ref 0.0–0.1)
Basophils Relative: 0 %
Eosinophils Absolute: 0.8 10*3/uL — ABNORMAL HIGH (ref 0.0–0.5)
Eosinophils Relative: 1 %
Immature Granulocytes: 23 %
Lymphocytes Relative: 5 %
Lymphs Abs: 4.4 10*3/uL — ABNORMAL HIGH (ref 0.7–4.0)
Monocytes Absolute: 5.4 10*3/uL — ABNORMAL HIGH (ref 0.1–1.0)
Monocytes Relative: 7 %
Neutro Abs: 51.4 10*3/uL — ABNORMAL HIGH (ref 1.7–7.7)
Neutrophils Relative %: 64 %

## 2020-10-20 LAB — APTT: aPTT: 36 seconds (ref 24–36)

## 2020-10-20 LAB — I-STAT ARTERIAL BLOOD GAS, ED
Acid-base deficit: 1 mmol/L (ref 0.0–2.0)
Bicarbonate: 24.3 mmol/L (ref 20.0–28.0)
Calcium, Ion: 1.26 mmol/L (ref 1.15–1.40)
HCT: 27 % — ABNORMAL LOW (ref 39.0–52.0)
Hemoglobin: 9.2 g/dL — ABNORMAL LOW (ref 13.0–17.0)
O2 Saturation: 100 %
Patient temperature: 98.2
Potassium: 4.1 mmol/L (ref 3.5–5.1)
Sodium: 139 mmol/L (ref 135–145)
TCO2: 26 mmol/L (ref 22–32)
pCO2 arterial: 40.7 mmHg (ref 32.0–48.0)
pH, Arterial: 7.383 (ref 7.350–7.450)
pO2, Arterial: 390 mmHg — ABNORMAL HIGH (ref 83.0–108.0)

## 2020-10-20 LAB — I-STAT CHEM 8, ED
BUN: 24 mg/dL — ABNORMAL HIGH (ref 8–23)
Calcium, Ion: 1.12 mmol/L — ABNORMAL LOW (ref 1.15–1.40)
Chloride: 105 mmol/L (ref 98–111)
Creatinine, Ser: 1.7 mg/dL — ABNORMAL HIGH (ref 0.61–1.24)
Glucose, Bld: 174 mg/dL — ABNORMAL HIGH (ref 70–99)
HCT: 24 % — ABNORMAL LOW (ref 39.0–52.0)
Hemoglobin: 8.2 g/dL — ABNORMAL LOW (ref 13.0–17.0)
Potassium: 4 mmol/L (ref 3.5–5.1)
Sodium: 138 mmol/L (ref 135–145)
TCO2: 20 mmol/L — ABNORMAL LOW (ref 22–32)

## 2020-10-20 LAB — CBC
HCT: 23.7 % — ABNORMAL LOW (ref 39.0–52.0)
Hemoglobin: 7.5 g/dL — ABNORMAL LOW (ref 13.0–17.0)
MCH: 29.3 pg (ref 26.0–34.0)
MCHC: 31.6 g/dL (ref 30.0–36.0)
MCV: 92.6 fL (ref 80.0–100.0)
Platelets: 2016 10*3/uL (ref 150–400)
RBC: 2.56 MIL/uL — ABNORMAL LOW (ref 4.22–5.81)
RDW: 20.7 % — ABNORMAL HIGH (ref 11.5–15.5)
WBC: 81 10*3/uL (ref 4.0–10.5)
nRBC: 3.3 % — ABNORMAL HIGH (ref 0.0–0.2)

## 2020-10-20 LAB — PROTIME-INR
INR: 1.2 (ref 0.8–1.2)
Prothrombin Time: 15 seconds (ref 11.4–15.2)

## 2020-10-20 LAB — ETHANOL: Alcohol, Ethyl (B): <10 mg/dL (ref <10)

## 2020-10-20 MED ORDER — IOHEXOL 350 MG/ML SOLN
100.0000 mL | Freq: Once | INTRAVENOUS | Status: AC | PRN
  Administered 2020-10-20: 100 mL via INTRAVENOUS

## 2020-10-20 NOTE — Consult Note (Signed)
Neurology Consultation  Reason for Consult: Code stroke left-sided weakness, aphasia Referring Physician: Dr. Marda Stalker, EDP  CC: Left-sided weakness, aphasia  History is obtained from: Chart, patient  HPI: Patrick Rodriguez. is a 86 y.o. male past medical history of COPD, basal cell cancer, bilateral carotid stenosis, coronary artery disease, hypertension, history of MI, history of syncope, leukocytosis and thrombocytosis-myelodysplastic syndrome, presented to the emergency room from EMS for sudden onset of confusion, left-sided weakness and aphasia. Last known well 2100 on 10/20/2020 when he was brought to change the channel of the TV and could not figure out what to do with remote control. Initial blood pressures by EMS in the 409W systolic but the rose to upwards of 119 systolic. Brought in as an acute code stroke because EMS also noticed some mild left facial weakness and some mild left arm weakness on site which had improved. Upon initial assessment in the emergency room, he continued to complain of ongoing shortness of breath, for which she has been followed by pulmonology. He did not feel that he was having trouble with word finding but was noted on stroke screen by EMS.   LKW: 2100 hrs. 10/20/2020 tpa given?: no, low NIH- too mild to treat Premorbid modified Rankin scale (mRS): 1  ROS: Performed and negative except as noted in HPI.  Past Medical History:  Diagnosis Date  . Allergic rhinitis   . Basal cell carcinoma 01/07/2020   left preauricular  . Basal cell carcinoma 06/25/2020   right tip of nose  . Carotid artery stenosis    12/2011:  50-69% on the left and 1-49% on the right;  Carotid US (11/2013): Bilateral 40-59%; repeat 1 year  . Cataract   . Coronary artery disease    a.  s/p stenting of the LAD in 2000;  b. Lexiscan Myoview (12/2013): No ischemia, EF 70%; normal study  . ED (erectile dysfunction)   . GERD (gastroesophageal reflux disease)   .  Hyperlipidemia   . Hyperplastic colon polyp 11/15/2013   x3  . Hypertension   . Myocardial infarction ([place])   . Normal echocardiogram Jan 2013   EF of 55% and no wall motion abnormalities  . Normal nuclear stress test 2008  . Obesity   . Palpitations    Holter (11/2013): NSR, sinus brady, PVCs, rare blocked PAC, no sig arrhythmia  . Squamous cell carcinoma of skin 01/07/2020   right lat elbow  . Substance abuse ([place])   . Syncope Jan 2013   felt to be vasovagal; had normal echo, carotids ok, 1st degree AV block with mention (but no tracing) of 2nd degree type I AV block while in [place]  . Tubular adenoma 11/15/2013   x2    Family History  Problem Relation Age of Onset  . Aneurysm Mother   . Stomach cancer Father   . Cancer Father   . Cancer Sister     Social History:   reports that he quit smoking about 51 years ago. His smoking use included cigarettes. He has a 56.00 pack-year smoking history. He has never used smokeless tobacco. He reports that he does not drink alcohol and does not use drugs.  Medications  Current Facility-Administered Medications:  .  Dupilumab SOPN 300 mg, 300 mg, Subcutaneous, Q14 Days, Ralene Bathe, MD, 300 mg at 06/04/20 1359  Current Outpatient Medications:  .  amLODipine (NORVASC) 10 MG tablet, TAKE 1 TABLET BY MOUTH DAILY, Disp: 90 tablet, Rfl: 3 .  Ascorbic Acid (VITAMIN  C) 1000 MG tablet, Take 1,000 mg by mouth daily.  , Disp: , Rfl:  .  aspirin 81 MG tablet, Take 81 mg by mouth daily. , Disp: , Rfl:  .  b complex vitamins tablet, Take 1 tablet by mouth daily.  , Disp: , Rfl:  .  doxazosin (CARDURA) 4 MG tablet, TAKE 1 TABLET BY MOUTH DAILY, Disp: 90 tablet, Rfl: 1 .  Dupilumab (DUPIXENT ), Inject into the skin every 14 (fourteen) days., Disp: , Rfl:  .  DUPIXENT 300 MG/2ML SOPN, , Disp: , Rfl:  .  magnesium gluconate (MAGONATE) 500 MG tablet, Take 500 mg by mouth daily.  , Disp: , Rfl:  .  metoprolol succinate (TOPROL XL) 25 MG 24 hr  tablet, Take 1 tablet (25 mg total) by mouth daily., Disp: 90 tablet, Rfl: 3 .  mometasone-formoterol (DULERA) 100-5 MCG/ACT AERO, Inhale 2 puffs into the lungs every 12 (twelve) hours., Disp: 1 each, Rfl: 11 .  Multiple Vitamins-Minerals (ZINC PO), Take by mouth., Disp: , Rfl:  .  Nattokinase 100 MG CAPS, Take by mouth daily.  , Disp: , Rfl:  .  omeprazole (PRILOSEC) 40 MG capsule, Take 40 mg by mouth daily., Disp: , Rfl:  .  tranexamic acid (LYSTEDA) 650 MG TABS tablet, Take 1 tablet (650 mg total) by mouth in the morning, at noon, in the evening, and at bedtime. Dissolve 1 tablet in 10-15 ml of water, allow it to dissolve over 3-5 minutes. Swish in mouth x 2 minutes then spit. Repeat every 6 hours until bleeding stops, then continue for another 24 hours., Disp: 20 tablet, Rfl: 0 .  TURMERIC PO, Take by mouth., Disp: , Rfl:    Exam: Current vital signs: BP (!) 192/68 (BP Location: Left Arm)   Pulse 96   Resp 19   Wt 70.1 kg   BMI 24.94 kg/m  Vital signs in last 24 hours: Pulse Rate:  [96] 96 (11/15 2221) Resp:  [19] 19 (11/15 2221) BP: (192)/(68) 192/68 (11/15 2221) Weight:  [70.1 kg] 70.1 kg (11/15 2216) General: Awake alert in no distress HEENT: Normocephalic atraumatic Lungs: Scattered rales bilaterally Cardiovascular regular rate rhythm Abdomen nondistended nontender Extremities warm well perfused Neurological Awake alert oriented x3 Speech is not dysarthric There is mild aphasia when trying to say long sentences-mixes up words at the end. No problems with naming or comprehension. Cranial nerves: Pupils equal round react light, extraocular movements intact, visual fields full, face sensation intact, facial symmetry mildly distorted with left nasolabial fold and left angle of the mouth drooping at rest but able to overcome with smiling, auditory acuity diminished bilaterally, tongue and palate midline. Motor exam: No drift in any of the 4 extremities Sensation intact to touch  all over No dysmetria NIH stroke scale-2.   Labs I have reviewed labs in epic and the results pertinent to this consultation are: Leukocytosis 81,000 which is much increased from prior value of 32,000.  Thrombocytosis with platelet count of over 2 million, which is also much higher than the prior 893,000. CBC    Component Value Date/Time   WBC 81.0 (HH) 10/20/2020 2202   RBC 2.56 (L) 10/20/2020 2202   HGB 9.2 (L) 10/20/2020 2319   HGB 8.2 (L) 09/25/2020 0836   HCT 27.0 (L) 10/20/2020 2319   PLT 2,016 (HH) 10/20/2020 2202   PLT 893 (H) 09/25/2020 0836   MCV 92.6 10/20/2020 2202   MCV 88.9 12/09/2015 1152   MCH 29.3 10/20/2020 2202  MCHC 31.6 10/20/2020 2202   RDW 20.7 (H) 10/20/2020 2202   LYMPHSABS 4.4 (H) 10/20/2020 2202   MONOABS 5.4 (H) 10/20/2020 2202   EOSABS 0.8 (H) 10/20/2020 2202   BASOSABS 0.3 (H) 10/20/2020 2202   Creatinine 1.7, increased from 1.5 in July, GFR 42.  CMP     Component Value Date/Time   NA 139 10/20/2020 2319   K 4.1 10/20/2020 2319   CL 105 10/20/2020 2209   CO2 22 10/20/2020 2202   GLUCOSE 174 (H) 10/20/2020 2209   BUN 24 (H) 10/20/2020 2209   CREATININE 1.70 (H) 10/20/2020 2209   CREATININE 1.56 (H) 06/30/2020 1029   CREATININE 1.15 (H) 12/09/2015 1144   CALCIUM 9.0 10/20/2020 2202   PROT 6.1 (L) 10/20/2020 2202   ALBUMIN 3.2 (L) 10/20/2020 2202   AST 32 10/20/2020 2202   AST 23 06/30/2020 1029   ALT 22 10/20/2020 2202   ALT 15 06/30/2020 1029   ALKPHOS 57 10/20/2020 2202   BILITOT 0.4 10/20/2020 2202   BILITOT 0.3 06/30/2020 1029   GFRNONAA 42 (L) 10/20/2020 2202   GFRNONAA 38 (L) 06/30/2020 1029   GFRNONAA 60 06/19/2015 1119   GFRAA 44 (L) 06/30/2020 1029   GFRAA 70 06/19/2015 1119    Lipid Panel     Component Value Date/Time   CHOL 157 07/05/2013 0915   TRIG 46.0 07/05/2013 0915   HDL 38.90 (L) 07/05/2013 0915   CHOLHDL 4 07/05/2013 0915   VLDL 9.2 07/05/2013 0915   LDLCALC 109 (H) 07/05/2013 0915     Imaging I  have reviewed the images obtained: CT-scan of the brain-no acute changes. CTA head and neck with no emergent LVO.  Bilateral proximal internal carotid arteries 70% stenosed on the right and 90% on the left. Small pleural effusion multiple upper mediastinal lymph nodes.   Even though he was within 6 hours, CT perfusion was performed to get a sense of whether there is any perfusion deficit but CT perfusion study failed due to technical difficulty related to bolus.  I requested a manual check from the neuro radiologist-results awaited. MRI examination of the brain could not be performed because the patient could not lie flat in the scanner and started to desaturate.  Assessment: 86 year old man with past history of COPD, basal cell cancer, bilateral carotid stenosis, coronary artery disease, MI, history of hypertension, leukocytosis and thrombocytosis-myelodysplastic syndrome presented to the emergency room for evaluation of left-sided weakness and aphasia. Low NIH stroke scale on examination precluded TPA. He does have bilateral carotid stenosis up to 90% on the left and 70% on the right. Symptoms could have been secondary to a small stroke or a TIA in the setting of carotid stenosis. Other possibilities could include hypertensive urgency/emergency. Currently, he also has extremely deranged WBC and platelet counts which will require admission. I would recommend completing a stroke/TIA risk factor work-up during the admission as well  Impression: Evaluate for stroke/TIA  Recommendations: Admit to hospitalist Frequent neurochecks Management of COPD and respiratory issues per primary team as you are. Management of the thrombocytosis and leukocytosis per primary team/hematological-oncological services. Telemetry 2D echo A1c Lipid panel MRI brain without contrast when able to PT OT Speech therapy N.p.o. until cleared by bedside swallow evaluation/stroke swallow screen. Continue aspirin 81  for now Plan relayed to Quincy Carnes, PA-C in the ED.  -- Amie Portland, MD [facility] Pager: [phone] If 7pm to 7am, please call on call as listed on AMION.

## 2020-10-20 NOTE — ED Triage Notes (Addendum)
Pt BIB [facility], from home with wife calling for the c/o left sided weakness and moderate aphasia, LKW 2100 on 10/20/20. EMS arrived code stroke activated, Pt c/o SOB , hx COPD, pt on RA   VS  200/100, 1610/70 on sceen  CBG 155 HR 90'S   Pt a/o x3   Bilateral 16g in [facility]

## 2020-10-20 NOTE — ED Provider Notes (Addendum)
[facility] EMERGENCY DEPARTMENT Provider Note   CSN: [id number] Arrival date & time: 10/20/20  2200     History Chief Complaint  Patient presents with  . Code Stroke    Patrick Rodriguez. is a 86 y.o. male.  The history is provided by the patient, medical records and the EMS personnel.    86 y.o. M with hx of basal cell carcinoma, CAD with prior MI, GERD, HLP, HTN, MDS, presenting to the ED as a code stroke.  LKW 2100 when he was watching TV with wife and suddenly could not use the remote control and could not express this verbally to wife.  Patient AAOx3 with EMS but did report some worsening expressive aphasia en route.  No prior history of stroke.  He is on daily ASA.  On arrival to ED he is AAOx3, speech is grossly clear and able to answer questions/follow commands.  Past Medical History:  Diagnosis Date  . Allergic rhinitis   . Basal cell carcinoma 01/07/2020   left preauricular  . Basal cell carcinoma 06/25/2020   right tip of nose  . Carotid artery stenosis    12/2011:  50-69% on the left and 1-49% on the right;  Carotid US (11/2013): Bilateral 40-59%; repeat 1 year  . Cataract   . Coronary artery disease    a.  s/p stenting of the LAD in 2000;  b. Lexiscan Myoview (12/2013): No ischemia, EF 70%; normal study  . ED (erectile dysfunction)   . GERD (gastroesophageal reflux disease)   . Hyperlipidemia   . Hyperplastic colon polyp 11/15/2013   x3  . Hypertension   . Myocardial infarction ([place])   . Normal echocardiogram Jan 2013   EF of 55% and no wall motion abnormalities  . Normal nuclear stress test 2008  . Obesity   . Palpitations    Holter (11/2013): NSR, sinus brady, PVCs, rare blocked PAC, no sig arrhythmia  . Squamous cell carcinoma of skin 01/07/2020   right lat elbow  . Substance abuse ([place])   . Syncope Jan 2013   felt to be vasovagal; had normal echo, carotids ok, 1st degree AV block with mention (but no tracing) of 2nd degree type I AV  block while in [place]  . Tubular adenoma 11/15/2013   x2    Patient Active Problem List   Diagnosis Date Noted  . COPD with acute exacerbation ([place]) 12/13/2017  . Esophageal dysphagia ? gerd related 11/11/2014  . (HFpEF) heart failure with preserved ejection fraction ([place]) 12/14/2013  . Anemia 12/14/2013  . Acute post-hemorrhagic anemia 12/04/2013  . Cough 12/03/2013  . Carotid stenosis 11/06/2013  . Psoriasiform dermatitis 09/20/2012  . Edema 05/01/2012  . Syncope 12/29/2011  . CAD (coronary artery disease) 10/26/2011  . Asthma exacerbation 09/06/2011  . Acute bronchitis 09/06/2011  . Hypercholesterolemia 03/14/2008  . Essential hypertension 03/14/2008  . MYOCARDIAL INFARCTION 03/14/2008  . ALLERGIC RHINITIS 03/14/2008  . COPD GOLD I 03/14/2008  . ANGINA, HX OF 03/14/2008    Past Surgical History:  Procedure Laterality Date  . APPENDECTOMY    . CARDIAC CATHETERIZATION  01/20/1999   EF 60%  . CARDIOVASCULAR STRESS TEST  04/19/2007   EF 74%  . CORONARY STENT PLACEMENT  2000   LAD  . DOPPLER ECHOCARDIOGRAPHY  02/06/1998   EF 60%  . TONSILLECTOMY         Family History  Problem Relation Age of Onset  . Aneurysm Mother   . Stomach cancer Father   .  Cancer Father   . Cancer Sister     Social History   Tobacco Use  . Smoking status: Former Smoker    Packs/day: 2.00    Years: 28.00    Pack years: 56.00    Types: Cigarettes    Quit date: 12/06/1968    Years since quitting: 51.9  . Smokeless tobacco: Never Used  Vaping Use  . Vaping Use: Never used  Substance Use Topics  . Alcohol use: No    Alcohol/week: 0.0 standard drinks  . Drug use: No    Home Medications Prior to Admission medications   Medication Sig Start Date End Date Taking? Authorizing Provider  amLODipine (NORVASC) 10 MG tablet TAKE 1 TABLET BY MOUTH DAILY 08/14/20   [place], Peter M, MD  Ascorbic Acid (VITAMIN C) 1000 MG tablet Take 1,000 mg by mouth daily.      [provider]  aspirin 81  MG tablet Take 81 mg by mouth daily.     [provider]  b complex vitamins tablet Take 1 tablet by mouth daily.      [provider]  doxazosin (CARDURA) 4 MG tablet TAKE 1 TABLET BY MOUTH DAILY 08/14/20   Almyra Deforest, PA  Dupilumab (DUPIXENT [place]) Inject into the skin every 14 (fourteen) days.    [provider]  [place] 300 MG/2ML SOPN  01/17/20   [provider]  magnesium gluconate (MAGONATE) 500 MG tablet Take 500 mg by mouth daily.      [provider]  metoprolol succinate (TOPROL XL) 25 MG 24 hr tablet Take 1 tablet (25 mg total) by mouth daily. 03/11/20   [place], Peter M, MD  mometasone-formoterol [facility] 100-5 MCG/ACT AERO Inhale 2 puffs into the lungs every 12 (twelve) hours. 09/04/20   Tanda Rockers, MD  Multiple Vitamins-Minerals (ZINC PO) Take by mouth.    [provider]  Nattokinase 100 MG CAPS Take by mouth daily.      [provider]  omeprazole (PRILOSEC) 40 MG capsule Take 40 mg by mouth daily.    [provider]  tranexamic acid (LYSTEDA) 650 MG TABS tablet Take 1 tablet (650 mg total) by mouth in the morning, at noon, in the evening, and at bedtime. Dissolve 1 tablet in 10-15 ml of water, allow it to dissolve over 3-5 minutes. Swish in mouth x 2 minutes then spit. Repeat every 6 hours until bleeding stops, then continue for another 24 hours. 04/28/20   Orson Slick, MD  TURMERIC PO Take by mouth.    [provider]    Allergies    Hctz [hydrochlorothiazide]  Review of Systems   Review of Systems  Neurological: Positive for speech difficulty and weakness.  All other systems reviewed and are negative.   Physical Exam Updated Vital Signs BP (!) 192/68 (BP Location: Left Arm)   Pulse 96   Resp 19   Wt 70.1 kg   BMI 24.94 kg/m   Physical Exam Vitals and nursing note reviewed.  Constitutional:      Appearance: He is well-developed.     Comments: Elderly, NAD  HENT:     Head:  Normocephalic and atraumatic.  Eyes:     Conjunctiva/sclera: Conjunctivae normal.     Pupils: Pupils are equal, round, and reactive to light.  Cardiovascular:     Rate and Rhythm: Normal rate and regular rhythm.     Heart sounds: Normal heart sounds.  Pulmonary:     Effort: Pulmonary effort  is normal.     Breath sounds: Normal breath sounds.  Abdominal:     General: Bowel sounds are normal.     Palpations: Abdomen is soft.  Musculoskeletal:        General: Normal range of motion.     Cervical back: Normal range of motion.  Skin:    General: Skin is warm and dry.  Neurological:     Mental Status: He is alert and oriented to person, place, and time.     Comments: AAOx3, answering questions and following commands appropriately; equal strength UE and LE bilaterally; CN grossly intact; moves all extremities appropriately without ataxia; ? Mild left facial droop, speech is clear and goal oriented     ED Results / Procedures / Treatments   Labs (all labs ordered are listed, but only abnormal results are displayed) Labs Reviewed  CBC - Abnormal; Notable for the following components:      Result Value   WBC 81.0 (*)    RBC 2.56 (*)    Hemoglobin 7.5 (*)    HCT 23.7 (*)    RDW 20.7 (*)    Platelets 2,016 (*)    nRBC 3.3 (*)    All other components within normal limits  DIFFERENTIAL - Abnormal; Notable for the following components:   Neutro Abs 51.4 (*)    Lymphs Abs 4.4 (*)    Monocytes Absolute 5.4 (*)    Eosinophils Absolute 0.8 (*)    Basophils Absolute 0.3 (*)    Abs Immature Granulocytes 18.79 (*)    All other components within normal limits  COMPREHENSIVE METABOLIC PANEL - Abnormal; Notable for the following components:   Glucose, Bld 172 (*)    Creatinine, Ser 1.53 (*)    Total Protein 6.1 (*)    Albumin 3.2 (*)    GFR, Estimated 42 (*)    All other components within normal limits  URINALYSIS, ROUTINE W REFLEX MICROSCOPIC - Abnormal; Notable for the following  components:   Specific Gravity, Urine 1.031 (*)    Protein, ur 100 (*)    All other components within normal limits  BRAIN NATRIURETIC PEPTIDE - Abnormal; Notable for the following components:   B Natriuretic Peptide 439.1 (*)    All other components within normal limits  I-STAT CHEM 8, ED - Abnormal; Notable for the following components:   BUN 24 (*)    Creatinine, Ser 1.70 (*)    Glucose, Bld 174 (*)    Calcium, Ion 1.12 (*)    TCO2 20 (*)    Hemoglobin 8.2 (*)    HCT 24.0 (*)    All other components within normal limits  I-STAT ARTERIAL BLOOD GAS, ED - Abnormal; Notable for the following components:   pO2, Arterial 390 (*)    HCT 27.0 (*)    Hemoglobin 9.2 (*)    All other components within normal limits  RESPIRATORY PANEL BY RT PCR (FLU A&B, COVID)  ETHANOL  PROTIME-INR  APTT  RAPID URINE DRUG SCREEN, HOSP PERFORMED  PATHOLOGIST SMEAR REVIEW  HEMOGLOBIN A1C  LIPID PANEL    EKG None  Radiology CT Code Stroke CTA Head W/WO contrast  Result Date: 10/20/2020 CLINICAL DATA:  Left-sided weakness and aphasia EXAM: CT ANGIOGRAPHY HEAD AND NECK CT PERFUSION BRAIN TECHNIQUE: Multidetector CT imaging of the head and neck was performed using the standard protocol during bolus administration of intravenous contrast. Multiplanar CT image reconstructions and MIPs were obtained to evaluate the vascular anatomy. Carotid stenosis measurements (when applicable)  are obtained utilizing NASCET criteria, using the distal internal carotid diameter as the denominator. Multiphase CT imaging of the brain was performed following IV bolus contrast injection. Subsequent parametric perfusion maps were calculated using RAPID software. CONTRAST:  191mL OMNIPAQUE IOHEXOL 350 MG/ML SOLN COMPARISON:  None. FINDINGS: CTA NECK FINDINGS SKELETON: There is no bony spinal canal stenosis. No lytic or blastic lesion. OTHER NECK: Normal pharynx, larynx and major salivary glands. No cervical lymphadenopathy.  Unremarkable thyroid gland. UPPER CHEST: Small pleural effusions and multiple upper mediastinal lymph nodes. AORTIC ARCH: There is calcific atherosclerosis of the aortic arch. There is no aneurysm, dissection or hemodynamically significant stenosis of the visualized portion of the aorta. Conventional 3 vessel aortic branching pattern. The visualized proximal subclavian arteries are widely patent. RIGHT CAROTID SYSTEM: No dissection, occlusion or aneurysm. There is mixed density atherosclerosis extending into the proximal ICA, resulting in 70% stenosis. LEFT CAROTID SYSTEM: No dissection, occlusion or aneurysm. There is predominantly calcified atherosclerosis extending into the proximal ICA, resulting in approximately 90 % stenosis. VERTEBRAL ARTERIES: Left dominant configuration. Both origins are clearly patent. There is no dissection, occlusion or flow-limiting stenosis to the skull base (V1-V3 segments). CTA HEAD FINDINGS POSTERIOR CIRCULATION: --Vertebral arteries: Normal V4 segments. --Inferior cerebellar arteries: Normal. --Basilar artery: Normal. --Superior cerebellar arteries: Normal. --Posterior cerebral arteries (PCA): Normal. The right PCA is predominantly supplied by the posterior communicating artery. Left P-comm is also present. ANTERIOR CIRCULATION: --Intracranial internal carotid arteries: Normal. --Anterior cerebral arteries (ACA): Normal. Both A1 segments are present. Patent anterior communicating artery (a-comm). --Middle cerebral arteries (MCA): Normal. VENOUS SINUSES: As permitted by contrast timing, patent. ANATOMIC VARIANTS: None Review of the MIP images confirms the above findings. CT Brain Perfusion Findings: Contrast bolus was deemed insufficient by the RAPID software. Manual post processing of the data will be attempted and, if successful, an addendum will be issued. IMPRESSION: 1. No intracranial arterial occlusion or high-grade stenosis of the intracranial arteries. 2. Bilateral proximal  internal carotid artery stenoses, measuring 70 % on the right and 90 % on the left. 3. Small pleural effusions and multiple upper mediastinal lymph nodes. 4. Contrast bolus was deemed insufficient by the RAPID software. Manual post processing of the data will be attempted and, if successful, an addendum will be issued. Aortic Atherosclerosis (ICD10-I70.0). Electronically Signed   By: Ulyses Jarred M.D.   On: 10/20/2020 23:20   CT Code Stroke CTA Neck W/WO contrast  Result Date: 10/20/2020 CLINICAL DATA:  Left-sided weakness and aphasia EXAM: CT ANGIOGRAPHY HEAD AND NECK CT PERFUSION BRAIN TECHNIQUE: Multidetector CT imaging of the head and neck was performed using the standard protocol during bolus administration of intravenous contrast. Multiplanar CT image reconstructions and MIPs were obtained to evaluate the vascular anatomy. Carotid stenosis measurements (when applicable) are obtained utilizing NASCET criteria, using the distal internal carotid diameter as the denominator. Multiphase CT imaging of the brain was performed following IV bolus contrast injection. Subsequent parametric perfusion maps were calculated using RAPID software. CONTRAST:  154mL OMNIPAQUE IOHEXOL 350 MG/ML SOLN COMPARISON:  None. FINDINGS: CTA NECK FINDINGS SKELETON: There is no bony spinal canal stenosis. No lytic or blastic lesion. OTHER NECK: Normal pharynx, larynx and major salivary glands. No cervical lymphadenopathy. Unremarkable thyroid gland. UPPER CHEST: Small pleural effusions and multiple upper mediastinal lymph nodes. AORTIC ARCH: There is calcific atherosclerosis of the aortic arch. There is no aneurysm, dissection or hemodynamically significant stenosis of the visualized portion of the aorta. Conventional 3 vessel aortic branching pattern. The visualized proximal  subclavian arteries are widely patent. RIGHT CAROTID SYSTEM: No dissection, occlusion or aneurysm. There is mixed density atherosclerosis extending into the  proximal ICA, resulting in 70% stenosis. LEFT CAROTID SYSTEM: No dissection, occlusion or aneurysm. There is predominantly calcified atherosclerosis extending into the proximal ICA, resulting in approximately 90 % stenosis. VERTEBRAL ARTERIES: Left dominant configuration. Both origins are clearly patent. There is no dissection, occlusion or flow-limiting stenosis to the skull base (V1-V3 segments). CTA HEAD FINDINGS POSTERIOR CIRCULATION: --Vertebral arteries: Normal V4 segments. --Inferior cerebellar arteries: Normal. --Basilar artery: Normal. --Superior cerebellar arteries: Normal. --Posterior cerebral arteries (PCA): Normal. The right PCA is predominantly supplied by the posterior communicating artery. Left P-comm is also present. ANTERIOR CIRCULATION: --Intracranial internal carotid arteries: Normal. --Anterior cerebral arteries (ACA): Normal. Both A1 segments are present. Patent anterior communicating artery (a-comm). --Middle cerebral arteries (MCA): Normal. VENOUS SINUSES: As permitted by contrast timing, patent. ANATOMIC VARIANTS: None Review of the MIP images confirms the above findings. CT Brain Perfusion Findings: Contrast bolus was deemed insufficient by the RAPID software. Manual post processing of the data will be attempted and, if successful, an addendum will be issued. IMPRESSION: 1. No intracranial arterial occlusion or high-grade stenosis of the intracranial arteries. 2. Bilateral proximal internal carotid artery stenoses, measuring 70 % on the right and 90 % on the left. 3. Small pleural effusions and multiple upper mediastinal lymph nodes. 4. Contrast bolus was deemed insufficient by the RAPID software. Manual post processing of the data will be attempted and, if successful, an addendum will be issued. Aortic Atherosclerosis (ICD10-I70.0). Electronically Signed   By: Ulyses Jarred M.D.   On: 10/20/2020 23:20   CT Code Stroke Cerebral Perfusion with contrast  Result Date: 10/20/2020 CLINICAL  DATA:  Left-sided weakness and aphasia EXAM: CT ANGIOGRAPHY HEAD AND NECK CT PERFUSION BRAIN TECHNIQUE: Multidetector CT imaging of the head and neck was performed using the standard protocol during bolus administration of intravenous contrast. Multiplanar CT image reconstructions and MIPs were obtained to evaluate the vascular anatomy. Carotid stenosis measurements (when applicable) are obtained utilizing NASCET criteria, using the distal internal carotid diameter as the denominator. Multiphase CT imaging of the brain was performed following IV bolus contrast injection. Subsequent parametric perfusion maps were calculated using RAPID software. CONTRAST:  158mL OMNIPAQUE IOHEXOL 350 MG/ML SOLN COMPARISON:  None. FINDINGS: CTA NECK FINDINGS SKELETON: There is no bony spinal canal stenosis. No lytic or blastic lesion. OTHER NECK: Normal pharynx, larynx and major salivary glands. No cervical lymphadenopathy. Unremarkable thyroid gland. UPPER CHEST: Small pleural effusions and multiple upper mediastinal lymph nodes. AORTIC ARCH: There is calcific atherosclerosis of the aortic arch. There is no aneurysm, dissection or hemodynamically significant stenosis of the visualized portion of the aorta. Conventional 3 vessel aortic branching pattern. The visualized proximal subclavian arteries are widely patent. RIGHT CAROTID SYSTEM: No dissection, occlusion or aneurysm. There is mixed density atherosclerosis extending into the proximal ICA, resulting in 70% stenosis. LEFT CAROTID SYSTEM: No dissection, occlusion or aneurysm. There is predominantly calcified atherosclerosis extending into the proximal ICA, resulting in approximately 90 % stenosis. VERTEBRAL ARTERIES: Left dominant configuration. Both origins are clearly patent. There is no dissection, occlusion or flow-limiting stenosis to the skull base (V1-V3 segments). CTA HEAD FINDINGS POSTERIOR CIRCULATION: --Vertebral arteries: Normal V4 segments. --Inferior cerebellar  arteries: Normal. --Basilar artery: Normal. --Superior cerebellar arteries: Normal. --Posterior cerebral arteries (PCA): Normal. The right PCA is predominantly supplied by the posterior communicating artery. Left P-comm is also present. ANTERIOR CIRCULATION: --Intracranial internal carotid arteries: Normal. --  Anterior cerebral arteries (ACA): Normal. Both A1 segments are present. Patent anterior communicating artery (a-comm). --Middle cerebral arteries (MCA): Normal. VENOUS SINUSES: As permitted by contrast timing, patent. ANATOMIC VARIANTS: None Review of the MIP images confirms the above findings. CT Brain Perfusion Findings: Contrast bolus was deemed insufficient by the RAPID software. Manual post processing of the data will be attempted and, if successful, an addendum will be issued. IMPRESSION: 1. No intracranial arterial occlusion or high-grade stenosis of the intracranial arteries. 2. Bilateral proximal internal carotid artery stenoses, measuring 70 % on the right and 90 % on the left. 3. Small pleural effusions and multiple upper mediastinal lymph nodes. 4. Contrast bolus was deemed insufficient by the RAPID software. Manual post processing of the data will be attempted and, if successful, an addendum will be issued. Aortic Atherosclerosis (ICD10-I70.0). Electronically Signed   By: Ulyses Jarred M.D.   On: 10/20/2020 23:20   DG Chest Port 1 View  Result Date: 10/20/2020 CLINICAL DATA:  Shortness of breath EXAM: PORTABLE CHEST 1 VIEW COMPARISON:  12/13/2017 FINDINGS: Cardiomegaly. Bilateral interstitial and airspace opacities, likely edema. No effusions. No acute bony abnormality. IMPRESSION: Bilateral interstitial and airspace opacities, most likely edema/CHF. Electronically Signed   By: Rolm Baptise M.D.   On: 10/20/2020 23:05   CT HEAD CODE STROKE WO CONTRAST  Result Date: 10/20/2020 CLINICAL DATA:  Code stroke.  Left-sided weakness and aphasia EXAM: CT HEAD WITHOUT CONTRAST TECHNIQUE: Contiguous  axial images were obtained from the base of the skull through the vertex without intravenous contrast. COMPARISON:  None. FINDINGS: Brain: There is no mass, hemorrhage or extra-axial collection. The size and configuration of the ventricles and extra-axial CSF spaces are normal. There is hypoattenuation of the periventricular white matter, most commonly indicating chronic ischemic microangiopathy. Vascular: No abnormal hyperdensity of the major intracranial arteries or dural venous sinuses. No intracranial atherosclerosis. Skull: The visualized skull base, calvarium and extracranial soft tissues are normal. Sinuses/Orbits: No fluid levels or advanced mucosal thickening of the visualized paranasal sinuses. No mastoid or middle ear effusion. The orbits are normal. ASPECTS [facility] Stroke Program Early CT Score) - Ganglionic level infarction (caudate, lentiform nuclei, internal capsule, insula, M1-M3 cortex): 7 - Supraganglionic infarction (M4-M6 cortex): 3 Total score (0-10 with 10 being normal): 10 IMPRESSION: 1. Chronic ischemic microangiopathy without acute intracranial abnormality. 2. ASPECTS is 10. These results were communicated to Dr. Amie Portland at 10:16 pm on 10/20/2020 by text page via the [facility] messaging system. Electronically Signed   By: Ulyses Jarred M.D.   On: 10/20/2020 22:16    Procedures Procedures (including critical care time)  CRITICAL CARE Performed by: Larene Pickett   Total critical care time: 45 minutes  Critical care time was exclusive of separately billable procedures and treating other patients.  Critical care was necessary to treat or prevent imminent or life-threatening deterioration.  Critical care was time spent personally by me on the following activities: development of treatment plan with patient and/or surrogate as well as nursing, discussions with consultants, evaluation of patient's response to treatment, examination of patient, obtaining history from patient or  surrogate, ordering and performing treatments and interventions, ordering and review of laboratory studies, ordering and review of radiographic studies, pulse oximetry and re-evaluation of patient's condition.   Medications Ordered in ED Medications  furosemide (LASIX) injection 40 mg (has no administration in time range)   stroke: mapping our early stages of recovery book (has no administration in time range)  acetaminophen (TYLENOL) tablet 650 mg (  has no administration in time range)    Or  acetaminophen (TYLENOL) 160 MG/5ML solution 650 mg (has no administration in time range)    Or  acetaminophen (TYLENOL) suppository 650 mg (has no administration in time range)  senna-docusate (Senokot-S) tablet 1 tablet (has no administration in time range)  heparin injection 5,000 Units (has no administration in time range)  aspirin chewable tablet 81 mg (has no administration in time range)  mometasone-formoterol (DULERA) 100-5 MCG/ACT inhaler 2 puff (has no administration in time range)  ipratropium-albuterol (DUONEB) 0.5-2.5 (3) MG/3ML nebulizer solution 3 mL (has no administration in time range)  albuterol (PROVENTIL) (2.5 MG/3ML) 0.083% nebulizer solution 2.5 mg (has no administration in time range)  labetalol (NORMODYNE) injection 10 mg (has no administration in time range)  iohexol (OMNIPAQUE) 350 MG/ML injection 100 mL (100 mLs Intravenous Contrast Given 10/20/20 2226)    ED Course  I have reviewed the triage vital signs and the nursing notes.  Pertinent labs & imaging results that were available during my care of the patient were reviewed by me and considered in my medical decision making (see chart for details).    MDM Rules/Calculators/A&P  87 y.o. M presenting to the ED as a CODE STROKE.  LKW 2100 when had sudden onset speech difficulty and left sided weakness.  EMS reported expressive aphasia.  On arrival to ED he is AAOx3, answering questions and follow commands without difficulty.   No significant dysarthria or expressive aphasia noted.  No strength or sensory discrepancy.  Questionable mild left facial droop.  NIH score of 1.  He was taken for emergent head CT, labs pending.  10:54 PM Patient returned from MRI after CT and CTA's-- not able to complete scan due to new hypoxia down to around 73% on RA.  Started on NRB and sats currently 100%.  Does have long history of COPD, follows with pulmonology.  He reports mild cough recently but no production of mucous-- dry cough noted at present but clear lung sounds.  No fever/chills.  Denies known sick contacts.  He has not been vaccinated for covid (states he refuses).  Will obtain stat portable chest, ABG, and covid screen.  In regards to code status, patient states he does not want to be on any kind of artificial machine.  ABG reassuring.  CXR with possible pulmonary edema, BNP 400s.  No hx of CHF previously.  Able to titrate down from [facility], now on couple liters O2 via [place] and appears comfortable.  CT's without definitive findings of CVA and NIH of 1, not a candidate for tpa at this time.  Spoke with Dr. Rory Percy once more about scans-- does have significant carotid disease so TIA in differential as well.  Will attempt to get MRI again when stable from respiratory standpoint.  Patient's labs in regards to MDS also worsening-- WBC count up to 81K today compared with 30K previously, platelets also significantly elevated.  Based on chart review, it appears patient has refused chemotherapy and other pharmacologic treatments in the past.    Discussed with hospitalist, Dr. Posey Pronto-- will admit for ongoing care.  Final Clinical Impression(s) / ED Diagnoses Final diagnoses:  Left-sided weakness  Acute respiratory failure with hypoxia ([place])  Thrombocytosis    Rx / DC Orders ED Discharge Orders    None       Larene Pickett, PA-C 10/21/20 0140    Tegeler, Gwenyth Allegra, MD 10/21/20 1542    Larene Pickett, PA-C 10/21/20 2223  Tegeler, Gwenyth Allegra, MD 10/21/20 [phone]

## 2020-10-20 NOTE — Progress Notes (Signed)
Pt placed on 2LPM [place] salter at this time based on ABG results.   Will continue to monitor.

## 2020-10-21 ENCOUNTER — Observation Stay (HOSPITAL_COMMUNITY): Payer: Medicare Other

## 2020-10-21 ENCOUNTER — Encounter (HOSPITAL_COMMUNITY): Payer: Self-pay | Admitting: Internal Medicine

## 2020-10-21 ENCOUNTER — Other Ambulatory Visit: Payer: Self-pay

## 2020-10-21 ENCOUNTER — Telehealth: Payer: Self-pay | Admitting: *Deleted

## 2020-10-21 ENCOUNTER — Telehealth: Payer: Self-pay

## 2020-10-21 DIAGNOSIS — I6340 Cerebral infarction due to embolism of unspecified cerebral artery: Secondary | ICD-10-CM | POA: Insufficient documentation

## 2020-10-21 DIAGNOSIS — J9601 Acute respiratory failure with hypoxia: Secondary | ICD-10-CM | POA: Diagnosis present

## 2020-10-21 DIAGNOSIS — D75839 Thrombocytosis, unspecified: Secondary | ICD-10-CM

## 2020-10-21 DIAGNOSIS — I63132 Cerebral infarction due to embolism of left carotid artery: Secondary | ICD-10-CM

## 2020-10-21 DIAGNOSIS — I63512 Cerebral infarction due to unspecified occlusion or stenosis of left middle cerebral artery: Secondary | ICD-10-CM | POA: Diagnosis not present

## 2020-10-21 DIAGNOSIS — I252 Old myocardial infarction: Secondary | ICD-10-CM | POA: Diagnosis not present

## 2020-10-21 DIAGNOSIS — N183 Chronic kidney disease, stage 3 unspecified: Secondary | ICD-10-CM | POA: Diagnosis present

## 2020-10-21 DIAGNOSIS — R4701 Aphasia: Secondary | ICD-10-CM | POA: Diagnosis present

## 2020-10-21 DIAGNOSIS — Z66 Do not resuscitate: Secondary | ICD-10-CM | POA: Diagnosis present

## 2020-10-21 DIAGNOSIS — R531 Weakness: Secondary | ICD-10-CM | POA: Diagnosis not present

## 2020-10-21 DIAGNOSIS — Z20822 Contact with and (suspected) exposure to covid-19: Secondary | ICD-10-CM | POA: Diagnosis present

## 2020-10-21 DIAGNOSIS — D72829 Elevated white blood cell count, unspecified: Secondary | ICD-10-CM

## 2020-10-21 DIAGNOSIS — E7800 Pure hypercholesterolemia, unspecified: Secondary | ICD-10-CM | POA: Diagnosis present

## 2020-10-21 DIAGNOSIS — I6782 Cerebral ischemia: Secondary | ICD-10-CM | POA: Diagnosis not present

## 2020-10-21 DIAGNOSIS — I6523 Occlusion and stenosis of bilateral carotid arteries: Secondary | ICD-10-CM | POA: Diagnosis not present

## 2020-10-21 DIAGNOSIS — Z7982 Long term (current) use of aspirin: Secondary | ICD-10-CM | POA: Diagnosis not present

## 2020-10-21 DIAGNOSIS — R29818 Other symptoms and signs involving the nervous system: Secondary | ICD-10-CM | POA: Diagnosis not present

## 2020-10-21 DIAGNOSIS — Z85828 Personal history of other malignant neoplasm of skin: Secondary | ICD-10-CM | POA: Diagnosis not present

## 2020-10-21 DIAGNOSIS — D471 Chronic myeloproliferative disease: Secondary | ICD-10-CM | POA: Diagnosis present

## 2020-10-21 DIAGNOSIS — N1832 Chronic kidney disease, stage 3b: Secondary | ICD-10-CM | POA: Diagnosis present

## 2020-10-21 DIAGNOSIS — I2510 Atherosclerotic heart disease of native coronary artery without angina pectoris: Secondary | ICD-10-CM | POA: Diagnosis present

## 2020-10-21 DIAGNOSIS — K219 Gastro-esophageal reflux disease without esophagitis: Secondary | ICD-10-CM | POA: Diagnosis present

## 2020-10-21 DIAGNOSIS — I340 Nonrheumatic mitral (valve) insufficiency: Secondary | ICD-10-CM

## 2020-10-21 DIAGNOSIS — R29702 NIHSS score 2: Secondary | ICD-10-CM | POA: Diagnosis present

## 2020-10-21 DIAGNOSIS — G9389 Other specified disorders of brain: Secondary | ICD-10-CM | POA: Diagnosis not present

## 2020-10-21 DIAGNOSIS — I129 Hypertensive chronic kidney disease with stage 1 through stage 4 chronic kidney disease, or unspecified chronic kidney disease: Secondary | ICD-10-CM | POA: Diagnosis present

## 2020-10-21 DIAGNOSIS — G8194 Hemiplegia, unspecified affecting left nondominant side: Secondary | ICD-10-CM | POA: Diagnosis present

## 2020-10-21 DIAGNOSIS — Z800 Family history of malignant neoplasm of digestive organs: Secondary | ICD-10-CM | POA: Diagnosis not present

## 2020-10-21 DIAGNOSIS — I342 Nonrheumatic mitral (valve) stenosis: Secondary | ICD-10-CM

## 2020-10-21 DIAGNOSIS — Z955 Presence of coronary angioplasty implant and graft: Secondary | ICD-10-CM | POA: Diagnosis not present

## 2020-10-21 DIAGNOSIS — E785 Hyperlipidemia, unspecified: Secondary | ICD-10-CM | POA: Diagnosis present

## 2020-10-21 DIAGNOSIS — I5032 Chronic diastolic (congestive) heart failure: Secondary | ICD-10-CM | POA: Diagnosis present

## 2020-10-21 DIAGNOSIS — R7303 Prediabetes: Secondary | ICD-10-CM | POA: Diagnosis present

## 2020-10-21 DIAGNOSIS — I63011 Cerebral infarction due to thrombosis of right vertebral artery: Secondary | ICD-10-CM | POA: Diagnosis present

## 2020-10-21 DIAGNOSIS — D696 Thrombocytopenia, unspecified: Secondary | ICD-10-CM | POA: Diagnosis present

## 2020-10-21 DIAGNOSIS — D649 Anemia, unspecified: Secondary | ICD-10-CM | POA: Diagnosis not present

## 2020-10-21 DIAGNOSIS — I130 Hypertensive heart and chronic kidney disease with heart failure and stage 1 through stage 4 chronic kidney disease, or unspecified chronic kidney disease: Secondary | ICD-10-CM | POA: Diagnosis present

## 2020-10-21 DIAGNOSIS — D469 Myelodysplastic syndrome, unspecified: Secondary | ICD-10-CM

## 2020-10-21 DIAGNOSIS — J449 Chronic obstructive pulmonary disease, unspecified: Secondary | ICD-10-CM | POA: Diagnosis present

## 2020-10-21 LAB — BRAIN NATRIURETIC PEPTIDE: B Natriuretic Peptide: 439.1 pg/mL — ABNORMAL HIGH (ref 0.0–100.0)

## 2020-10-21 LAB — BASIC METABOLIC PANEL
Anion gap: 10 (ref 5–15)
BUN: 21 mg/dL (ref 8–23)
CO2: 23 mmol/L (ref 22–32)
Calcium: 9.3 mg/dL (ref 8.9–10.3)
Chloride: 107 mmol/L (ref 98–111)
Creatinine, Ser: 1.5 mg/dL — ABNORMAL HIGH (ref 0.61–1.24)
GFR, Estimated: 43 mL/min — ABNORMAL LOW (ref >60)
Glucose, Bld: 131 mg/dL — ABNORMAL HIGH (ref 70–99)
Potassium: 4 mmol/L (ref 3.5–5.1)
Sodium: 140 mmol/L (ref 135–145)

## 2020-10-21 LAB — RAPID URINE DRUG SCREEN, HOSP PERFORMED
Amphetamines: NOT DETECTED
Barbiturates: NOT DETECTED
Benzodiazepines: NOT DETECTED
Cocaine: NOT DETECTED
Opiates: NOT DETECTED
Tetrahydrocannabinol: NOT DETECTED

## 2020-10-21 LAB — CBC WITH DIFFERENTIAL/PLATELET
Abs Immature Granulocytes: 20.09 10*3/uL — ABNORMAL HIGH (ref 0.00–0.07)
Basophils Absolute: 0.3 10*3/uL — ABNORMAL HIGH (ref 0.0–0.1)
Basophils Relative: 0 %
Eosinophils Absolute: 0.9 10*3/uL — ABNORMAL HIGH (ref 0.0–0.5)
Eosinophils Relative: 1 %
HCT: 25.3 % — ABNORMAL LOW (ref 39.0–52.0)
Hemoglobin: 8 g/dL — ABNORMAL LOW (ref 13.0–17.0)
Immature Granulocytes: 21 %
Lymphocytes Relative: 5 %
Lymphs Abs: 4.8 10*3/uL — ABNORMAL HIGH (ref 0.7–4.0)
MCH: 28.7 pg (ref 26.0–34.0)
MCHC: 31.6 g/dL (ref 30.0–36.0)
MCV: 90.7 fL (ref 80.0–100.0)
Monocytes Absolute: 5.9 10*3/uL — ABNORMAL HIGH (ref 0.1–1.0)
Monocytes Relative: 6 %
Neutro Abs: 64.9 10*3/uL — ABNORMAL HIGH (ref 1.7–7.7)
Neutrophils Relative %: 67 %
Platelets: 2111 10*3/uL (ref 150–400)
RBC: 2.79 MIL/uL — ABNORMAL LOW (ref 4.22–5.81)
RDW: 21.3 % — ABNORMAL HIGH (ref 11.5–15.5)
WBC: 96.9 10*3/uL (ref 4.0–10.5)
nRBC: 3.4 % — ABNORMAL HIGH (ref 0.0–0.2)

## 2020-10-21 LAB — LIPID PANEL
Cholesterol: 169 mg/dL (ref 0–200)
HDL: 25 mg/dL — ABNORMAL LOW (ref >40)
LDL Cholesterol: 115 mg/dL — ABNORMAL HIGH (ref 0–99)
Total CHOL/HDL Ratio: 6.8 RATIO
Triglycerides: 146 mg/dL (ref <150)
VLDL: 29 mg/dL (ref 0–40)

## 2020-10-21 LAB — ECHOCARDIOGRAM COMPLETE
Area-P 1/2: 5.13 cm2
Height: 66 in
S' Lateral: 3.17 cm
Weight: 2472.68 oz

## 2020-10-21 LAB — HEMOGLOBIN A1C
Hgb A1c MFr Bld: 6.1 % — ABNORMAL HIGH (ref 4.8–5.6)
Mean Plasma Glucose: 128.37 mg/dL

## 2020-10-21 LAB — RESPIRATORY PANEL BY RT PCR (FLU A&B, COVID)
Influenza A by PCR: NEGATIVE
Influenza B by PCR: NEGATIVE
SARS Coronavirus 2 by RT PCR: NEGATIVE

## 2020-10-21 MED ORDER — HYDROXYUREA 500 MG PO CAPS
500.0000 mg | ORAL_CAPSULE | Freq: Every day | ORAL | Status: DC
  Administered 2020-10-22: 500 mg via ORAL
  Filled 2020-10-21: qty 1

## 2020-10-21 MED ORDER — IPRATROPIUM-ALBUTEROL 0.5-2.5 (3) MG/3ML IN SOLN: 3.0000 mL | Freq: Four times a day (QID) | RESPIRATORY_TRACT | Status: DC | PRN

## 2020-10-21 MED ORDER — ALBUTEROL SULFATE (2.5 MG/3ML) 0.083% IN NEBU: 2.5000 mg | INHALATION_SOLUTION | RESPIRATORY_TRACT | Status: DC | PRN

## 2020-10-21 MED ORDER — STROKE: EARLY STAGES OF RECOVERY BOOK
Freq: Once | Status: AC
  Filled 2020-10-21: qty 1

## 2020-10-21 MED ORDER — ATORVASTATIN CALCIUM 40 MG PO TABS
40.0000 mg | ORAL_TABLET | Freq: Every day | ORAL | Status: DC
  Administered 2020-10-21 – 2020-10-22 (×2): 40 mg via ORAL
  Filled 2020-10-21 (×2): qty 1

## 2020-10-21 MED ORDER — AMLODIPINE BESYLATE 10 MG PO TABS
10.0000 mg | ORAL_TABLET | Freq: Every day | ORAL | Status: DC
  Administered 2020-10-22: 10 mg via ORAL
  Filled 2020-10-21: qty 1

## 2020-10-21 MED ORDER — SENNOSIDES-DOCUSATE SODIUM 8.6-50 MG PO TABS: 1.0000 | ORAL_TABLET | Freq: Every evening | ORAL | Status: DC | PRN

## 2020-10-21 MED ORDER — FUROSEMIDE 10 MG/ML IJ SOLN
40.0000 mg | Freq: Once | INTRAMUSCULAR | Status: AC
  Administered 2020-10-21: 40 mg via INTRAVENOUS
  Filled 2020-10-21: qty 4

## 2020-10-21 MED ORDER — ACETAMINOPHEN 160 MG/5ML PO SOLN: 650.0000 mg | ORAL | Status: DC | PRN

## 2020-10-21 MED ORDER — LABETALOL HCL 5 MG/ML IV SOLN: 10.0000 mg | INTRAVENOUS | Status: DC | PRN

## 2020-10-21 MED ORDER — HEPARIN SODIUM (PORCINE) 5000 UNIT/ML IJ SOLN
5000.0000 [IU] | Freq: Three times a day (TID) | INTRAMUSCULAR | Status: DC
  Administered 2020-10-21 – 2020-10-22 (×5): 5000 [IU] via SUBCUTANEOUS
  Filled 2020-10-21 (×5): qty 1

## 2020-10-21 MED ORDER — ACETAMINOPHEN 650 MG RE SUPP: 650.0000 mg | RECTAL | Status: DC | PRN

## 2020-10-21 MED ORDER — ASPIRIN 81 MG PO CHEW
81.0000 mg | CHEWABLE_TABLET | Freq: Every day | ORAL | Status: DC
  Administered 2020-10-21 – 2020-10-22 (×2): 81 mg via ORAL
  Filled 2020-10-21 (×2): qty 1

## 2020-10-21 MED ORDER — MOMETASONE FURO-FORMOTEROL FUM 100-5 MCG/ACT IN AERO
2.0000 | INHALATION_SPRAY | Freq: Two times a day (BID) | RESPIRATORY_TRACT | Status: DC
  Administered 2020-10-21 – 2020-10-22 (×2): 2 via RESPIRATORY_TRACT
  Filled 2020-10-21: qty 8.8

## 2020-10-21 MED ORDER — ACETAMINOPHEN 325 MG PO TABS: 650.0000 mg | ORAL_TABLET | ORAL | Status: DC | PRN

## 2020-10-21 NOTE — Consult Note (Signed)
[place] [phone]   Fax:([phone]) [place] NOTE  Patient Care Team: Maury Dus, MD as PCP - General (Family Medicine) [place], Peter M, MD as PCP - Cardiology (Cardiology)  Hematological/Oncological History #Myelodysplastic Syndrome/ NOS Myeloproliferative Syndrome 1)07/26/2018: WBC 5.1, Hgb 11.6, MCV 94.4, Plt 363 2) 9//2020: WBC 25.1, Hgb 9.2, Plt 1083, MCV 93.2 (in context of eczema flare) 3) 09/20/2019: Iron 128, TIBC 242 (low), Iron sat 53%, transferrin 173 4) 10/15/2019: WBC 14.3, Hgb 8.8, Plt 485, MCV 90.6. FOB negative 5)11/07/2019:Establish care with Dr. Lorenso Courier 6) 11/26/2019: Bone marrow biopsy performed. Showed hypercellular marrow with 9% blasts, concern for MDS vs MPN. MDS panel negative for definitive mutation.WBC 29.3, Hgb 8.3, Plt 1008.  7) 12/14/2019: WBC 30.1, Hgb 7.8, Plt 1718. Received 1 units of PRBC for symptomatic anemia. BCR/ABL and JAK2 w/ reflex panels sent. Positive for U2AF1 and SRSF2 gene mutations. Neither are canonical mutations for MPN/MDS 8) 02/21/2020: received 1 unit of PRBC for Hgb 8.5 (symptomatic) 9) 04/24/2020: received 1 unit of PRBC for Hgb 8.2 (symptomatic) 10) 05/19/2020:  received 1 unit of PRBC for Hgb 8.0 (symptomatic)   HISTORY OF PRESENTING ILLNESS:  Patrick Rodriguez. 86 y.o. male with medical history significant for MDS/MPN overlap syndrome who presented the [facility] ED for evaluation of CVA symptoms.   On review of the previous records Patrick Rodriguez was at his baseline level of health until approximately 9:00 last night when he had a new onset of speech difficulty.  It was reportedly incomprehensible.  He knew he wanted to say but he could not get the words out.  This alarmed the family who called EMS on concern for a stroke.  Initial blood pressure was noted to be 427 systolic and subsequent noted to be 062 systolic.  On initial evaluation the emergency department his labs showed white blood cell count 81,  hemoglobin 7.5, MCV 92.6, and a platelet count of 2016.  A code stroke was called upon arrival to the emergency department.  Neurology service was consulted and recommended admission with frequent neuro checks and continuation of aspirin 81 mg p.o.  Due to concern for his markedly high white blood cell count and platelet count oncology was consulted for further evaluation management.  On exam today Patrick Rodriguez notes that he does not have any residual deficits as a result of his stroke.  He is speaking fluently and clearly and does not have any residual neurological deficits.  He reports that he has discussed his carotid stenosis with vascular surgery is in the process of considering a procedure.  He denies having any fevers, chills, sweats, nausea, vomiting or diarrhea.  He denies having any headache or vision changes.  He has not been having any itching of his skin or other infectious symptoms.  A full 10 point ROS is listed below.  MEDICAL HISTORY:  Past Medical History:  Diagnosis Date  . Allergic rhinitis   . Basal cell carcinoma 01/07/2020   left preauricular  . Basal cell carcinoma 06/25/2020   right tip of nose  . Carotid artery stenosis    12/2011:  50-69% on the left and 1-49% on the right;  Carotid US (11/2013): Bilateral 40-59%; repeat 1 year  . Cataract   . Coronary artery disease    a.  s/p stenting of the LAD in 2000;  b. Lexiscan Myoview (12/2013): No ischemia, EF 70%; normal study  . ED (erectile dysfunction)   . GERD (gastroesophageal reflux disease)   .  Hyperlipidemia   . Hyperplastic colon polyp 11/15/2013   x3  . Hypertension   . Myocardial infarction ([place])   . Normal echocardiogram Jan 2013   EF of 55% and no wall motion abnormalities  . Normal nuclear stress test 2008  . Obesity   . Palpitations    Holter (11/2013): NSR, sinus brady, PVCs, rare blocked PAC, no sig arrhythmia  . Squamous cell carcinoma of skin 01/07/2020   right lat elbow  . Substance abuse ([place])   .  Syncope Jan 2013   felt to be vasovagal; had normal echo, carotids ok, 1st degree AV block with mention (but no tracing) of 2nd degree type I AV block while in [place]  . Tubular adenoma 11/15/2013   x2    SURGICAL HISTORY: Past Surgical History:  Procedure Laterality Date  . APPENDECTOMY    . CARDIAC CATHETERIZATION  01/20/1999   EF 60%  . CARDIOVASCULAR STRESS TEST  04/19/2007   EF 74%  . CORONARY STENT PLACEMENT  2000   LAD  . DOPPLER ECHOCARDIOGRAPHY  02/06/1998   EF 60%  . TONSILLECTOMY      SOCIAL HISTORY: Social History   Socioeconomic History  . Marital status: Married    Spouse name: Not on file  . Number of children: Y  . Years of education: Not on file  . Highest education level: Not on file  Occupational History  . Occupation: traveling paster  Tobacco Use  . Smoking status: Former Smoker    Packs/day: 2.00    Years: 28.00    Pack years: 56.00    Types: Cigarettes    Quit date: 12/06/1968    Years since quitting: 51.9  . Smokeless tobacco: Never Used  Vaping Use  . Vaping Use: Never used  Substance and Sexual Activity  . Alcohol use: No    Alcohol/week: 0.0 standard drinks  . Drug use: No  . Sexual activity: Not on file  Other Topics Concern  . Not on file  Social History Narrative  . Not on file   Social Determinants of Health   Financial Resource Strain:   . Difficulty of Paying Living Expenses: Not on file  Food Insecurity:   . Worried About Charity fundraiser in the Last Year: Not on file  . Ran Out of Food in the Last Year: Not on file  Transportation Needs:   . Lack of Transportation (Medical): Not on file  . Lack of Transportation (Non-Medical): Not on file  Physical Activity:   . Days of Exercise per Week: Not on file  . Minutes of Exercise per Session: Not on file  Stress:   . Feeling of Stress : Not on file  Social Connections:   . Frequency of Communication with Friends and Family: Not on file  . Frequency of Social Gatherings with  Friends and Family: Not on file  . Attends Religious Services: Not on file  . Active Member of Clubs or Organizations: Not on file  . Attends Archivist Meetings: Not on file  . Marital Status: Not on file  Intimate Partner Violence:   . Fear of Current or Ex-Partner: Not on file  . Emotionally Abused: Not on file  . Physically Abused: Not on file  . Sexually Abused: Not on file    FAMILY HISTORY: Family History  Problem Relation Age of Onset  . Aneurysm Mother   . Stomach cancer Father   . Cancer Father   . Cancer Sister  ALLERGIES:  is allergic to hctz [hydrochlorothiazide].  MEDICATIONS:  Current Facility-Administered Medications  Medication Dose Route Frequency Provider Last Rate Last Admin  .  stroke: mapping our early stages of recovery book   Does not apply Once Lenore Cordia, MD      . acetaminophen (TYLENOL) tablet 650 mg  650 mg Oral Q4H PRN Lenore Cordia, MD       Or  . acetaminophen (TYLENOL) 160 MG/5ML solution 650 mg  650 mg Per Tube Q4H PRN Lenore Cordia, MD       Or  . acetaminophen (TYLENOL) suppository 650 mg  650 mg Rectal Q4H PRN Zada Finders R, MD      . albuterol (PROVENTIL) (2.5 MG/3ML) 0.083% nebulizer solution 2.5 mg  2.5 mg Nebulization Q4H PRN Zada Finders R, MD      . aspirin chewable tablet 81 mg  81 mg Oral Daily Lenore Cordia, MD   81 mg at 10/21/20 0914  . atorvastatin (LIPITOR) tablet 40 mg  40 mg Oral Daily Rosalin Hawking, MD   40 mg at 10/21/20 0914  . heparin injection 5,000 Units  5,000 Units Subcutaneous Q8H Lenore Cordia, MD   5,000 Units at 10/21/20 1516  . ipratropium-albuterol (DUONEB) 0.5-2.5 (3) MG/3ML nebulizer solution 3 mL  3 mL Nebulization Q6H PRN Zada Finders R, MD      . labetalol (NORMODYNE) injection 10 mg  10 mg Intravenous Q4H PRN Patel, Cleaster Corin, MD      . mometasone-formoterol (DULERA) 100-5 MCG/ACT inhaler 2 puff  2 puff Inhalation Q12H Patel, Vishal R, MD      . senna-docusate (Senokot-S) tablet 1  tablet  1 tablet Oral QHS PRN Lenore Cordia, MD        REVIEW OF SYSTEMS:   Constitutional: ( - ) fevers, ( - )  chills , ( - ) night sweats Eyes: ( - ) blurriness of vision, ( - ) double vision, ( - ) watery eyes Ears, nose, mouth, throat, and face: ( - ) mucositis, ( - ) sore throat Respiratory: ( - ) cough, ( - ) dyspnea, ( - ) wheezes Cardiovascular: ( - ) palpitation, ( - ) chest discomfort, ( - ) lower extremity swelling Gastrointestinal:  ( - ) nausea, ( - ) heartburn, ( - ) change in bowel habits Skin: ( - ) abnormal skin rashes Lymphatics: ( - ) new lymphadenopathy, ( - ) easy bruising Neurological: ( - ) numbness, ( - ) tingling, ( - ) new weaknesses Behavioral/Psych: ( - ) mood change, ( - ) new changes  All other systems were reviewed with the patient and are negative.  PHYSICAL EXAMINATION:  Vitals:   10/21/20 1029 10/21/20 1604  BP: (!) 184/70 (!) 152/48  Pulse: 83 80  Resp: 20 18  Temp: 98.1 F (36.7 C) 98.7 F (37.1 C)  SpO2: 99% 99%   Filed Weights   10/20/20 2216 10/21/20 1029  Weight: 154 lb 8.7 oz (70.1 kg) 154 lb 8.7 oz (70.1 kg)    GENERAL: well appearing elderly Caucasian male in NAD  SKIN: skin color, texture, turgor are normal, no rashes or significant lesions EYES: conjunctiva are pink and non-injected, sclera clear LUNGS: clear to auscultation and percussion with normal breathing effort HEART: regular rate & rhythm and no murmurs and no lower extremity edema Musculoskeletal: no cyanosis of digits and no clubbing  PSYCH: alert & oriented x 3, fluent speech NEURO: no focal motor/sensory deficits  LABORATORY DATA:  I have reviewed the data as listed CBC Latest Ref Rng & Units 10/21/2020 10/20/2020 10/20/2020  WBC 4.0 - 10.5 K/uL 96.9(HH) - -  Hemoglobin 13.0 - 17.0 g/dL 8.0(L) 9.2(L) 8.2(L)  Hematocrit 39 - 52 % 25.3(L) 27.0(L) 24.0(L)  Platelets 150 - 400 K/uL 2,111(HH) - -    CMP Latest Ref Rng & Units 10/21/2020 10/20/2020 10/20/2020    Glucose 70 - 99 mg/dL 131(H) - 174(H)  BUN 8 - 23 mg/dL 21 - 24(H)  Creatinine 0.61 - 1.24 mg/dL 1.50(H) - 1.70(H)  Sodium 135 - 145 mmol/L 140 139 138  Potassium 3.5 - 5.1 mmol/L 4.0 4.1 4.0  Chloride 98 - 111 mmol/L 107 - 105  CO2 22 - 32 mmol/L 23 - -  Calcium 8.9 - 10.3 mg/dL 9.3 - -  Total Protein 6.5 - 8.1 g/dL - - -  Total Bilirubin 0.3 - 1.2 mg/dL - - -  Alkaline Phos 38 - 126 U/L - - -  AST 15 - 41 U/L - - -  ALT 0 - 44 U/L - - -    RADIOGRAPHIC STUDIES: CT Code Stroke CTA Head W/WO contrast  Result Date: 10/20/2020 CLINICAL DATA:  Left-sided weakness and aphasia EXAM: CT ANGIOGRAPHY HEAD AND NECK CT PERFUSION BRAIN TECHNIQUE: Multidetector CT imaging of the head and neck was performed using the standard protocol during bolus administration of intravenous contrast. Multiplanar CT image reconstructions and MIPs were obtained to evaluate the vascular anatomy. Carotid stenosis measurements (when applicable) are obtained utilizing NASCET criteria, using the distal internal carotid diameter as the denominator. Multiphase CT imaging of the brain was performed following IV bolus contrast injection. Subsequent parametric perfusion maps were calculated using RAPID software. CONTRAST:  177m OMNIPAQUE IOHEXOL 350 MG/ML SOLN COMPARISON:  None. FINDINGS: CTA NECK FINDINGS SKELETON: There is no bony spinal canal stenosis. No lytic or blastic lesion. OTHER NECK: Normal pharynx, larynx and major salivary glands. No cervical lymphadenopathy. Unremarkable thyroid gland. UPPER CHEST: Small pleural effusions and multiple upper mediastinal lymph nodes. AORTIC ARCH: There is calcific atherosclerosis of the aortic arch. There is no aneurysm, dissection or hemodynamically significant stenosis of the visualized portion of the aorta. Conventional 3 vessel aortic branching pattern. The visualized proximal subclavian arteries are widely patent. RIGHT CAROTID SYSTEM: No dissection, occlusion or aneurysm. There is  mixed density atherosclerosis extending into the proximal ICA, resulting in 70% stenosis. LEFT CAROTID SYSTEM: No dissection, occlusion or aneurysm. There is predominantly calcified atherosclerosis extending into the proximal ICA, resulting in approximately 90 % stenosis. VERTEBRAL ARTERIES: Left dominant configuration. Both origins are clearly patent. There is no dissection, occlusion or flow-limiting stenosis to the skull base (V1-V3 segments). CTA HEAD FINDINGS POSTERIOR CIRCULATION: --Vertebral arteries: Normal V4 segments. --Inferior cerebellar arteries: Normal. --Basilar artery: Normal. --Superior cerebellar arteries: Normal. --Posterior cerebral arteries (PCA): Normal. The right PCA is predominantly supplied by the posterior communicating artery. Left P-comm is also present. ANTERIOR CIRCULATION: --Intracranial internal carotid arteries: Normal. --Anterior cerebral arteries (ACA): Normal. Both A1 segments are present. Patent anterior communicating artery (a-comm). --Middle cerebral arteries (MCA): Normal. VENOUS SINUSES: As permitted by contrast timing, patent. ANATOMIC VARIANTS: None Review of the MIP images confirms the above findings. CT Brain Perfusion Findings: Contrast bolus was deemed insufficient by the RAPID software. Manual post processing of the data will be attempted and, if successful, an addendum will be issued. IMPRESSION: 1. No intracranial arterial occlusion or high-grade stenosis of the intracranial arteries. 2. Bilateral proximal internal carotid artery stenoses,  measuring 70 % on the right and 90 % on the left. 3. Small pleural effusions and multiple upper mediastinal lymph nodes. 4. Contrast bolus was deemed insufficient by the RAPID software. Manual post processing of the data will be attempted and, if successful, an addendum will be issued. Aortic Atherosclerosis (ICD10-I70.0). Electronically Signed   By: Ulyses Jarred M.D.   On: 10/20/2020 23:20   CT Code Stroke CTA Neck W/WO  contrast  Result Date: 10/20/2020 CLINICAL DATA:  Left-sided weakness and aphasia EXAM: CT ANGIOGRAPHY HEAD AND NECK CT PERFUSION BRAIN TECHNIQUE: Multidetector CT imaging of the head and neck was performed using the standard protocol during bolus administration of intravenous contrast. Multiplanar CT image reconstructions and MIPs were obtained to evaluate the vascular anatomy. Carotid stenosis measurements (when applicable) are obtained utilizing NASCET criteria, using the distal internal carotid diameter as the denominator. Multiphase CT imaging of the brain was performed following IV bolus contrast injection. Subsequent parametric perfusion maps were calculated using RAPID software. CONTRAST:  125m OMNIPAQUE IOHEXOL 350 MG/ML SOLN COMPARISON:  None. FINDINGS: CTA NECK FINDINGS SKELETON: There is no bony spinal canal stenosis. No lytic or blastic lesion. OTHER NECK: Normal pharynx, larynx and major salivary glands. No cervical lymphadenopathy. Unremarkable thyroid gland. UPPER CHEST: Small pleural effusions and multiple upper mediastinal lymph nodes. AORTIC ARCH: There is calcific atherosclerosis of the aortic arch. There is no aneurysm, dissection or hemodynamically significant stenosis of the visualized portion of the aorta. Conventional 3 vessel aortic branching pattern. The visualized proximal subclavian arteries are widely patent. RIGHT CAROTID SYSTEM: No dissection, occlusion or aneurysm. There is mixed density atherosclerosis extending into the proximal ICA, resulting in 70% stenosis. LEFT CAROTID SYSTEM: No dissection, occlusion or aneurysm. There is predominantly calcified atherosclerosis extending into the proximal ICA, resulting in approximately 90 % stenosis. VERTEBRAL ARTERIES: Left dominant configuration. Both origins are clearly patent. There is no dissection, occlusion or flow-limiting stenosis to the skull base (V1-V3 segments). CTA HEAD FINDINGS POSTERIOR CIRCULATION: --Vertebral arteries:  Normal V4 segments. --Inferior cerebellar arteries: Normal. --Basilar artery: Normal. --Superior cerebellar arteries: Normal. --Posterior cerebral arteries (PCA): Normal. The right PCA is predominantly supplied by the posterior communicating artery. Left P-comm is also present. ANTERIOR CIRCULATION: --Intracranial internal carotid arteries: Normal. --Anterior cerebral arteries (ACA): Normal. Both A1 segments are present. Patent anterior communicating artery (a-comm). --Middle cerebral arteries (MCA): Normal. VENOUS SINUSES: As permitted by contrast timing, patent. ANATOMIC VARIANTS: None Review of the MIP images confirms the above findings. CT Brain Perfusion Findings: Contrast bolus was deemed insufficient by the RAPID software. Manual post processing of the data will be attempted and, if successful, an addendum will be issued. IMPRESSION: 1. No intracranial arterial occlusion or high-grade stenosis of the intracranial arteries. 2. Bilateral proximal internal carotid artery stenoses, measuring 70 % on the right and 90 % on the left. 3. Small pleural effusions and multiple upper mediastinal lymph nodes. 4. Contrast bolus was deemed insufficient by the RAPID software. Manual post processing of the data will be attempted and, if successful, an addendum will be issued. Aortic Atherosclerosis (ICD10-I70.0). Electronically Signed   By: KUlyses JarredM.D.   On: 10/20/2020 23:20   MR BRAIN WO CONTRAST  Result Date: 10/21/2020 CLINICAL DATA:  Neuro deficit with acute stroke suspected. EXAM: MRI HEAD WITHOUT CONTRAST TECHNIQUE: Multiplanar, multiecho pulse sequences of the brain and surrounding structures were obtained without intravenous contrast. COMPARISON:  CT and CTA from earlier today FINDINGS: Brain: Small patchy acute cortical infarcts in the left MCA  territory posterior division, superior temporal lobe to the parietooccipital convexity. There is mild chronic small vessel ischemia in the cerebral white matter.  Mild cerebral volume loss for age. No acute hemorrhage or generalized chronic hemorrhagic injury. No hydrocephalus, mass, or collection. Vascular: Major flow voids are preserved. Skull and upper cervical spine: No focal marrow lesion. Marrow in the visible upper cervical spine and skull base is somewhat hypointense for age, likely red marrow reconversion. Sinuses/Orbits: Mild mastoid opacification with negative nasopharynx. IMPRESSION: 1. Scattered small acute cortical infarcts in the posterior left MCA territory. 2. Age congruent senescent changes. 3. Lower than expected marrow signal, please correlate with CBC. Electronically Signed   By: Monte Fantasia M.D.   On: 10/21/2020 04:44   CT Code Stroke Cerebral Perfusion with contrast  Result Date: 10/20/2020 CLINICAL DATA:  Left-sided weakness and aphasia EXAM: CT ANGIOGRAPHY HEAD AND NECK CT PERFUSION BRAIN TECHNIQUE: Multidetector CT imaging of the head and neck was performed using the standard protocol during bolus administration of intravenous contrast. Multiplanar CT image reconstructions and MIPs were obtained to evaluate the vascular anatomy. Carotid stenosis measurements (when applicable) are obtained utilizing NASCET criteria, using the distal internal carotid diameter as the denominator. Multiphase CT imaging of the brain was performed following IV bolus contrast injection. Subsequent parametric perfusion maps were calculated using RAPID software. CONTRAST:  150m OMNIPAQUE IOHEXOL 350 MG/ML SOLN COMPARISON:  None. FINDINGS: CTA NECK FINDINGS SKELETON: There is no bony spinal canal stenosis. No lytic or blastic lesion. OTHER NECK: Normal pharynx, larynx and major salivary glands. No cervical lymphadenopathy. Unremarkable thyroid gland. UPPER CHEST: Small pleural effusions and multiple upper mediastinal lymph nodes. AORTIC ARCH: There is calcific atherosclerosis of the aortic arch. There is no aneurysm, dissection or hemodynamically significant stenosis  of the visualized portion of the aorta. Conventional 3 vessel aortic branching pattern. The visualized proximal subclavian arteries are widely patent. RIGHT CAROTID SYSTEM: No dissection, occlusion or aneurysm. There is mixed density atherosclerosis extending into the proximal ICA, resulting in 70% stenosis. LEFT CAROTID SYSTEM: No dissection, occlusion or aneurysm. There is predominantly calcified atherosclerosis extending into the proximal ICA, resulting in approximately 90 % stenosis. VERTEBRAL ARTERIES: Left dominant configuration. Both origins are clearly patent. There is no dissection, occlusion or flow-limiting stenosis to the skull base (V1-V3 segments). CTA HEAD FINDINGS POSTERIOR CIRCULATION: --Vertebral arteries: Normal V4 segments. --Inferior cerebellar arteries: Normal. --Basilar artery: Normal. --Superior cerebellar arteries: Normal. --Posterior cerebral arteries (PCA): Normal. The right PCA is predominantly supplied by the posterior communicating artery. Left P-comm is also present. ANTERIOR CIRCULATION: --Intracranial internal carotid arteries: Normal. --Anterior cerebral arteries (ACA): Normal. Both A1 segments are present. Patent anterior communicating artery (a-comm). --Middle cerebral arteries (MCA): Normal. VENOUS SINUSES: As permitted by contrast timing, patent. ANATOMIC VARIANTS: None Review of the MIP images confirms the above findings. CT Brain Perfusion Findings: Contrast bolus was deemed insufficient by the RAPID software. Manual post processing of the data will be attempted and, if successful, an addendum will be issued. IMPRESSION: 1. No intracranial arterial occlusion or high-grade stenosis of the intracranial arteries. 2. Bilateral proximal internal carotid artery stenoses, measuring 70 % on the right and 90 % on the left. 3. Small pleural effusions and multiple upper mediastinal lymph nodes. 4. Contrast bolus was deemed insufficient by the RAPID software. Manual post processing of the  data will be attempted and, if successful, an addendum will be issued. Aortic Atherosclerosis (ICD10-I70.0). Electronically Signed   By: KUlyses JarredM.D.   On: 10/20/2020 23:20  DG Chest Port 1 View  Result Date: 10/20/2020 CLINICAL DATA:  Shortness of breath EXAM: PORTABLE CHEST 1 VIEW COMPARISON:  12/13/2017 FINDINGS: Cardiomegaly. Bilateral interstitial and airspace opacities, likely edema. No effusions. No acute bony abnormality. IMPRESSION: Bilateral interstitial and airspace opacities, most likely edema/CHF. Electronically Signed   By: Rolm Baptise M.D.   On: 10/20/2020 23:05   ECHOCARDIOGRAM COMPLETE  Result Date: 10/21/2020    ECHOCARDIOGRAM REPORT   Patient Name:   Patrick Rodriguez Date of Exam: 10/21/2020 Medical Rec #:  [id number]    Height:       66.0 in Accession #:    [id number]   Weight:       154.5 lb Date of Birth:  12/03/1925     BSA:          1.792 m Patient Age:    65 years     BP:           187/66 mmHg Patient Gender: M            HR:           85 bpm. Exam Location:  Inpatient Procedure: 2D Echo, Color Doppler and Cardiac Doppler Indications:    Dyspnea  History:        Patient has prior history of Echocardiogram examinations, most                 recent 12/27/2011. Previous Myocardial Infarction and CAD,                 Signs/Symptoms:Syncope; Risk Factors:Hypertension and                 Dyslipidemia.  Sonographer:    Bernadene Person RDCS Referring Phys: [id number] [place]  1. Left ventricular ejection fraction, by estimation, is 55 to 60%. The left ventricle has normal function. The left ventricle has no regional wall motion abnormalities. Left ventricular diastolic parameters are consistent with Grade II diastolic dysfunction (pseudonormalization).  2. Right ventricular systolic function is normal. The right ventricular size is normal. There is moderately elevated pulmonary artery systolic pressure.  3. Left atrial size was severely dilated.  4. Right atrial size was  mildly dilated.  5. The mitral valve is normal in structure. Mild to moderate mitral valve regurgitation. Mild mitral stenosis. The mean mitral valve gradient is 5.5 mmHg with average heart rate of 83 bpm. Moderate mitral annular calcification.  6. The aortic valve is abnormal. There is moderate calcification of the aortic valve. Aortic valve regurgitation is trivial. No aortic stenosis is present.  7. The inferior vena cava is normal in size with <50% respiratory variability, suggesting right atrial pressure of 8 mmHg.  8. Cannot exclude atrial level shunt by color flow Doppler, consider agitated saline exam. FINDINGS  Left Ventricle: Left ventricular ejection fraction, by estimation, is 55 to 60%. The left ventricle has normal function. The left ventricle has no regional wall motion abnormalities. The left ventricular internal cavity size was normal in size. There is  no left ventricular hypertrophy. Left ventricular diastolic parameters are consistent with Grade II diastolic dysfunction (pseudonormalization). Right Ventricle: The right ventricular size is normal. No increase in right ventricular wall thickness. Right ventricular systolic function is normal. There is moderately elevated pulmonary artery systolic pressure. The tricuspid regurgitant velocity is 3.09 m/s, and with an assumed right atrial pressure of 8 mmHg, the estimated right ventricular systolic pressure is 57.3 mmHg. Left Atrium: Left atrial size was severely dilated. Right  Atrium: Right atrial size was mildly dilated. Pericardium: There is no evidence of pericardial effusion. Mitral Valve: The mitral valve is normal in structure. Moderate mitral annular calcification. Mild to moderate mitral valve regurgitation. Mild mitral valve stenosis. The mean mitral valve gradient is 5.5 mmHg with average heart rate of 83 bpm. Tricuspid Valve: The tricuspid valve is normal in structure. Tricuspid valve regurgitation is trivial. No evidence of tricuspid  stenosis. Aortic Valve: The aortic valve is abnormal. There is moderate calcification of the aortic valve. Aortic valve regurgitation is trivial. No aortic stenosis is present. Pulmonic Valve: The pulmonic valve was not well visualized. Pulmonic valve regurgitation is trivial. No evidence of pulmonic stenosis. Aorta: The aortic root is normal in size and structure. Venous: The inferior vena cava is normal in size with less than 50% respiratory variability, suggesting right atrial pressure of 8 mmHg. IAS/Shunts: Cannot exclude atrial level shunt by color flow Doppler, consider agitated saline exam.  LEFT VENTRICLE PLAX 2D LVIDd:         4.85 cm  Diastology LVIDs:         3.17 cm  LV e' medial:    7.62 cm/s LV PW:         0.74 cm  LV E/e' medial:  25.5 LV IVS:        0.75 cm  LV e' lateral:   8.49 cm/s LVOT diam:     2.00 cm  LV E/e' lateral: 22.9 LV SV:         90 LV SV Index:   50 LVOT Area:     3.14 cm  RIGHT VENTRICLE RV S prime:     9.90 cm/s TAPSE (M-mode): 2.8 cm LEFT ATRIUM             Index       RIGHT ATRIUM           Index LA diam:        4.60 cm 2.57 cm/m  RA Area:     17.90 cm LA Vol (A2C):   80.4 ml 44.86 ml/m RA Volume:   49.20 ml  27.45 ml/m LA Vol (A4C):   90.1 ml 50.27 ml/m LA Biplane Vol: 86.8 ml 48.43 ml/m  AORTIC VALVE LVOT Vmax:   120.00 cm/s LVOT Vmean:  86.100 cm/s LVOT VTI:    0.288 m  AORTA Ao Root diam: 3.00 cm MITRAL VALVE                TRICUSPID VALVE MV Area (PHT): 5.13 cm     TR Peak grad:   38.2 mmHg MV Mean grad:  5.5 mmHg     TR Vmax:        309.00 cm/s MV Decel Time: 148 msec MV E velocity: 194.00 cm/s  SHUNTS MV A velocity: 131.00 cm/s  Systemic VTI:  0.29 m MV E/A ratio:  1.48         Systemic Diam: 2.00 cm Cherlynn Kaiser MD Electronically signed by Cherlynn Kaiser MD Signature Date/Time: 10/21/2020/3:23:39 PM    Final    CT HEAD CODE STROKE WO CONTRAST  Result Date: 10/20/2020 CLINICAL DATA:  Code stroke.  Left-sided weakness and aphasia EXAM: CT HEAD WITHOUT  CONTRAST TECHNIQUE: Contiguous axial images were obtained from the base of the skull through the vertex without intravenous contrast. COMPARISON:  None. FINDINGS: Brain: There is no mass, hemorrhage or extra-axial collection. The size and configuration of the ventricles and extra-axial CSF spaces are normal. There is hypoattenuation of  the periventricular white matter, most commonly indicating chronic ischemic microangiopathy. Vascular: No abnormal hyperdensity of the major intracranial arteries or dural venous sinuses. No intracranial atherosclerosis. Skull: The visualized skull base, calvarium and extracranial soft tissues are normal. Sinuses/Orbits: No fluid levels or advanced mucosal thickening of the visualized paranasal sinuses. No mastoid or middle ear effusion. The orbits are normal. ASPECTS [facility] Stroke Program Early CT Score) - Ganglionic level infarction (caudate, lentiform nuclei, internal capsule, insula, M1-M3 cortex): 7 - Supraganglionic infarction (M4-M6 cortex): 3 Total score (0-10 with 10 being normal): 10 IMPRESSION: 1. Chronic ischemic microangiopathy without acute intracranial abnormality. 2. ASPECTS is 10. These results were communicated to Dr. Amie Portland at 10:16 pm on 10/20/2020 by text page via the [facility] messaging system. Electronically Signed   By: Ulyses Jarred M.D.   On: 10/20/2020 22:16    ASSESSMENT & PLAN Patrick Rodriguez. 86 y.o. male with medical history significant for leukocytosis, thrombocytosis, and anemia presents for a CVA. His lab findings and bone marrow biopsy are consistent with MDS vs NOS MPN.   #Marked Leukocytosis/Thrombocytosis --this represents an acute worsening of his known leukocytosis/thrombocytosis. The reason why his levels have flared is not clear as it does not appear there was an inciting factor -- the patient's markedly high platelets and WBC likely played a role in his CVA --Platelets >1000 can cause an acquired von Willebrands disease, which  can lead to a hypocoagulable state, while the elevated cell volume can lead to thrombosis --previously I discussed cytoreductive therapy with Patrick Rodriguez. We reiterated that discussion today. The risk of starting hydroxyurea is that it may worsen his anemia. --after detailed discussion of the risks and benefits of hydroxyurea therapy, Patrick Rodriguez decided to start hydroxyurea 535m PO daily.  --plan to follow his blood counts in clinic weekly after d/c to assure his Hgb does not drop too severely.   #Cerebrovascular Accident --appreciate the management of neurology and primary team -- the patient's markedly high platelets and WBC likely played a role in his CVA --concern for carotid stenosis, patient has been evaluated by vascular surgery. He is currently considering carotid endarterectomy.  --cytoreductive therapy discussed above.   #Myelodysplastic Syndrome/ NOS Myeloproliferative Syndrome --bone marrow biopsy on 11/26/2019 showed hypercellular marrow with concern for MDSvs MPN. MDS panel negative for definitive mutation and canonical mutations such as JAK2, CALR, MPL, and BCR/ABL were negative. He does carry 2 mutations  U2AF1 and SRSF2 which are of unclear clinical significance --discussed the findings with the patient. He was agreeable to a comfort based approach where we focus on keeping his Hgb elevated to prevent symptoms. He did not wish to proceed with any hypomethylators/chemotherapy based treatments.  -- we previously had patient return monthly for lab check CBC and possible transfusion.   All questions were answered. The patient knows to call the clinic with any problems, questions or concerns.  A total of more than 50 minutes were spent on this encounter and over half of that time was spent on counseling and coordination of care as outlined above.   JLedell Peoples MD Department of Hematology/Oncology CDavisat WK[facility] Oakland CampusPhone: [phone]:  [phone]: [email or web address]  10/21/2020 4:36 PM

## 2020-10-21 NOTE — Progress Notes (Signed)
PROGRESS NOTE  Patrick Rodriguez. VVO:[id number] DOB: [birth year] DOA: 10/20/2020 PCP: Patrick Dus, MD  Brief History   Patrick Rodriguez. is a 86 y.o. male with medical history significant for MDS/MPS (with associated leukocytosis, thrombocytosis, and anemia), CAD, CAS, CKD stage III, COPD, hypertension, and hard of hearing who presents to the ED for evaluation of expressive aphasia.  History is supplemented by patient's wife at bedside.  Per wife, patient was last known well until 2108 on 10/20/2020 when he had new onset of speech difficulty.  She says speech was incomprehensible.  Patient states he knew what he wanted to say but could not get the right words out.  He was able to understand family when spoken to.  EMS were called due to concern for stroke.  Per ED documentation, initial blood pressure by EMS was 269 systolic which increased to 485 systolic.  EMS noticed some mild left facial weakness as well as mild left arm weakness.  On arrival to the ED he was complaining of shortness of breath.  Per patient, he has been having some progressive dyspnea on exertion.  He was diagnosed with COPD by his pulmonologist and started on maintenance inhalers.  He is concerned about CHF as well but has no prior known history of congestive heart failure.  He has been receiving intermittent PRBC transfusions for symptomatic anemia by his hematologist.  Last transfusion was about 3 weeks ago and per wife, did not improve fatigue that he has been experiencing.  ED Course:  Initial vitals showed BP 192/68, pulse 96, RR 19, SPO2 100% on room air.  Labs significant for WBC 81.0, absolute neutrophils 51,400, platelets 2,016,000, hemoglobin 7.5, sodium 138, potassium 4.0, bicarb 22, BUN 23, creatinine 1.53, serum glucose 172, BNP 439.1.  Urinalysis is negative for UTI.  UDS is negative.  Serum ethanol undetectable.  Patient was brought in as a code stroke and seen by neurology on arrival.  CT head  without contrast showed chronic ischemic microangiopathy without acute intracranial abnormality.  CTA head/neck was negative for intracranial arterial occlusion or high-grade stenosis of the intracranial arteries.  Bilateral proximal ICA stenosis measuring 70% on the right and 90% on the left is noted.  Small pleural effusions seen.  Patient went for MRI brain however became hypoxic with reported SPO2 in the 70s and MRI study was not able to be completed.  Follow-up portable chest x-ray shows enlarged cardiac silhouette with bilateral interstitial and airspace opacities.  Patient was initially placed on nonrebreather and has since been transitioned to 2-3 L supplemental O2 via [place].  Goals of care were discussed with patient and he has made it clear his CODE STATUS is DNR.  The hospitalist service was consulted to admit for further evaluation and management.  The patient was admitted to a telemetry bed. Neurology was consulted. MRI brain was completed and demonstrated scattered small acute cortical infarcts in the posterior left MCA territory, age congruent senescent changes, and lower than expected marrow signal which correlates with the patient's history of myelodysplastic syndrome.  Vascular surgery has been consulted for the patient's bilateral ICA stenosis. 70% stenosis on right and 90% stenosis on the left.  Consultants  . Neurology - stroke team . Vascular surgery.  Procedures  . None  Antibiotics   Anti-infectives (From admission, onward)   None    .  Subjective  The patient is resting comfortably in bed. He has had complete resolution of his speech and left sided weakness. The patient  does note that lately he has had chest pain with ambulation that gets better when he stops and takes a deep breath.  Objective   Vitals:  Vitals:   10/21/20 1029 10/21/20 1604  BP: (!) 184/70 (!) 152/48  Pulse: 83 80  Resp: 20 18  Temp: 98.1 F (36.7 C) 98.7 F (37.1 C)  SpO2: 99%  99%   Exam:  Constitutional:  . The patient is awake, alert, and oriented x 3. No acute distress. Respiratory:  . No increased work of breathing. . No wheezes, rales, or rhonchi . No tactile fremitus Cardiovascular:  . Regular rate and rhythm . No murmurs, ectopy, or gallups. . No lateral PMI. No thrills. Abdomen:  . Abdomen is soft, non-tender, non-distended . No hernias, masses, or organomegaly . Normoactive bowel sounds.  Musculoskeletal:  . No cyanosis, clubbing, or edema Skin:  . No rashes, lesions, ulcers . palpation of skin: no induration or nodules Neurologic:  . CN 2-12 intact . Sensation all 4 extremities intact Psychiatric:  . Mental status o Mood, affect appropriate o Orientation to person, place, time  . judgment and insight appear intact  I have personally reviewed the following:   Today's Data  . Vitals, BMP, lipid panel, ABG, CBC  Imaging  . CT Head . CTA head and Neck . MRI brain  Cardiology Data  . Echocardiogram; LVEF 55-60%, Normal function, No regional wall motion abnormalities, Feft ventricular diastolic parameters consistent with Grade II diastolic dysfunction, RV systolic function is normal. There is moderately elevated pulmonary artery systolic pressure. Atrial level shunt cannot be excluded by color flow doppler.   Scheduled Meds: .  stroke: mapping our early stages of recovery book   Does not apply Once  . aspirin  81 mg Oral Daily  . atorvastatin  40 mg Oral Daily  . heparin  5,000 Units Subcutaneous Q8H  . mometasone-formoterol  2 puff Inhalation Q12H   Continuous Infusions:  Principal Problem:   Left-sided weakness Active Problems:   Essential hypertension   COPD GOLD I   CAD (coronary artery disease)   Anemia   Myelodysplastic syndrome (HCC)   Leukocytosis   Thrombocytosis   Acute respiratory failure with hypoxia (HCC)   CKD (chronic kidney disease) stage 3, GFR 30-59 ml/min (HCC)   Cerebral embolism with cerebral  infarction   Cerebrovascular accident (CVA) due to thrombosis of right vertebral artery ([place])   LOS: 0 days   A & P  . Acute CVA with scattered small acute infarcts in the posterior left MCA territory as seen on MRI: Left-sided weakness and expressive aphasia are resolved. CTA head and neck without emergent LVO, b/l proximal ICA stenosis seen, 70% on the right and 90% on the left. Echocardiogram has been performed and demonstrates:  LVEF 55-60%, Normal function, No regional wall motion abnormalities, Feft ventricular diastolic parameters consistent with Grade II diastolic dysfunction, RV systolic function is normal. There is moderately elevated pulmonary artery systolic pressure. Atrial level shunt cannot be excluded by color flow doppler. Neurology is following and [facility]VS has been consulted to evaluate the patient for severe carotid stenosis. The patient is being monitored on telemetry.  Exertional Chest Pain: Pt describes chest pain that occurs with ambulation that improves with rest and deep respiration. Will consult cardiology.  Acute respiratory failure with hypoxia: Likely multifactorial. Pt has known COPD and grade II diastolic dysfunction. He also has moderate pulmonary artery hypertension. He is currently saturating 99% on room air. He was given 40 mg  Lasix last night in the ED. Home dose of Dulera with as needed albuterol and duo nebs. Strict I's and O's.  Myelodysplastic syndrome/myeloproliferative syndrome: Presenting with associated severe thrombocytosis and leukocytosis, worsened from previous labs.  At risk for thrombotic events due to severe thrombocytosis.  He has mild anemia as well.  Receives intermittent PRBC transfusions for symptomatic anemia.  Follows with hematology, Dr. Lorenso Courier and per last office note is being managed on a comfort based approach.  He did not want to proceed with any hypoventilatory/chemotherapy based treatments. Continue aspirin 81 mg daily as above. Follow  labs EDP has notified hematology  Carotid artery stenosis:CTA head and neck shows b/l proximal ICA stenosis seen, 70% on the right and 90% on the left.  Worsened compared prior carotid ultrasound 2 years ago. Vascular surgery has been consulted.  COPD: Continue Dulera, DuoNeb's, albuterol as needed as above.  Wean oxygen as able.  CAD: Pt describes some exertional chest pain. Cardiology has been consulted. Continue aspirin.  Not on statin as an outpatient.  CKD stage III: Chronic appears stable.  Hypertension: BP elevated, allowing for permissive hypertension for now pending MRI brain to rule out acute CVA.  Use labetalol as needed.  I have seen and examined this patient myself. I have spent 38 minutes in his evaluation and care.  DVT prophylaxis: Subcutaneous heparin Code Status: DNR, confirmed with patient Family Communication:  Disposition Plan: From home, dispo pending further CVA work-up, management of acute hypoxic respiratory failure, and hematology evaluation, vascular and cardiology consult. Consults called: Neurology following, hematology notified per EDP, Cardiology and vascular surgery Admission status:  Status is: Inpatient  Remains inpatient appropriate because:Inpatient level of care appropriate due to severity of illness   Dispo: The patient is from: Home              Anticipated d/c is to: Home              Anticipated d/c date is: 3 days              Patient currently is not medically stable to d/c. Chakara Bognar, DO [facility] Direct contact: see [email or web address]  7PM-7AM contact night coverage as above 10/21/2020, 4:27 PM  LOS: 0 days

## 2020-10-21 NOTE — Progress Notes (Signed)
SLP Cancellation Note  Patient Details Name: Patrick Rodriguez. MRN: [id number] DOB: June 21, 1925   Cancelled treatment:       Reason Eval/Treat Not Completed: Patient at procedure or test/unavailable  Gabriel Rainwater MA, CCC-SLP   Jarely Juncaj Meryl 10/21/2020, 11:41 AM

## 2020-10-21 NOTE — ED Notes (Signed)
Paged Dr Marlowe Sax to Rn Ariel--Kerrie Timm

## 2020-10-21 NOTE — ED Notes (Signed)
Pt to MRI at this time.

## 2020-10-21 NOTE — Progress Notes (Signed)
  Echocardiogram 2D Echocardiogram has been performed.  Patrick Rodriguez 10/21/2020, 12:32 PM

## 2020-10-21 NOTE — Consult Note (Signed)
Vascular and Vein Specialist   Patient name: Patrick Rodriguez. MRN: [id number] DOB: [birth year] Sex: male  REASON FOR CONSULT: Evaluation symptomatic carotid stenosis  HPI: Patrick Rodriguez. is a 86 y.o. male, admitted through the emergency room with stroke symptoms.  He is an extremely active 86 year old gentleman.  He was at home with his wife and was having some confusion and difficulty with the remote to change the television.  He also was having difficulty forming words with expressive aphasia.  EMS was called and he was transferred to [facility].  There was some initial question of left arm weakness but this resolved in route.  Patient had complete resolution of his symptoms aside from some mild word searching noted by neurology evaluation.  Currently he is sitting up in his room.  He is conversant and completely lucid.  He feels he is completely back to his normal baseline.  He did have a known history of moderate carotid disease which is been followed in the past with ultrasound and had never had any stroke symptoms in the past.  Has a remote history of coronary angioplasty.  Does have a history now of anemia felt to be secondary to myelodysplastic syndrome.  Past Medical History:  Diagnosis Date  . Allergic rhinitis   . Basal cell carcinoma 01/07/2020   left preauricular  . Basal cell carcinoma 06/25/2020   right tip of nose  . Carotid artery stenosis    12/2011:  50-69% on the left and 1-49% on the right;  Carotid US (11/2013): Bilateral 40-59%; repeat 1 year  . Cataract   . Coronary artery disease    a.  s/p stenting of the LAD in 2000;  b. Lexiscan Myoview (12/2013): No ischemia, EF 70%; normal study  . ED (erectile dysfunction)   . GERD (gastroesophageal reflux disease)   . Hyperlipidemia   . Hyperplastic colon polyp 11/15/2013   x3  . Hypertension   . Myocardial infarction ([place])   . Normal echocardiogram Jan 2013   EF of 55% and no wall motion abnormalities  .  Normal nuclear stress test 2008  . Obesity   . Palpitations    Holter (11/2013): NSR, sinus brady, PVCs, rare blocked PAC, no sig arrhythmia  . Squamous cell carcinoma of skin 01/07/2020   right lat elbow  . Substance abuse ([place])   . Syncope Jan 2013   felt to be vasovagal; had normal echo, carotids ok, 1st degree AV block with mention (but no tracing) of 2nd degree type I AV block while in [place]  . Tubular adenoma 11/15/2013   x2    Family History  Problem Relation Age of Onset  . Aneurysm Mother   . Stomach cancer Father   . Cancer Father   . Cancer Sister     SOCIAL HISTORY: Social History   Socioeconomic History  . Marital status: Married    Spouse name: Not on file  . Number of children: Y  . Years of education: Not on file  . Highest education level: Not on file  Occupational History  . Occupation: traveling paster  Tobacco Use  . Smoking status: Former Smoker    Packs/day: 2.00    Years: 28.00    Pack years: 56.00    Types: Cigarettes    Quit date: 12/06/1968    Years since quitting: 51.9  . Smokeless tobacco: Never Used  Vaping Use  . Vaping Use: Never used  Substance and  Sexual Activity  . Alcohol use: No    Alcohol/week: 0.0 standard drinks  . Drug use: No  . Sexual activity: Not on file  Other Topics Concern  . Not on file  Social History Narrative  . Not on file   Social Determinants of Health   Financial Resource Strain:   . Difficulty of Paying Living Expenses: Not on file  Food Insecurity:   . Worried About Charity fundraiser in the Last Year: Not on file  . Ran Out of Food in the Last Year: Not on file  Transportation Needs:   . Lack of Transportation (Medical): Not on file  . Lack of Transportation (Non-Medical): Not on file  Physical Activity:   . Days of Exercise per Week: Not on file  . Minutes of Exercise per Session: Not on file  Stress:   . Feeling of Stress : Not on file  Social Connections:   . Frequency of Communication with  Friends and Family: Not on file  . Frequency of Social Gatherings with Friends and Family: Not on file  . Attends Religious Services: Not on file  . Active Member of Clubs or Organizations: Not on file  . Attends Archivist Meetings: Not on file  . Marital Status: Not on file  Intimate Partner Violence:   . Fear of Current or Ex-Partner: Not on file  . Emotionally Abused: Not on file  . Physically Abused: Not on file  . Sexually Abused: Not on file    Allergies  Allergen Reactions  . Hctz [Hydrochlorothiazide] Other (See Comments)    Has had hyponatremia    Current Facility-Administered Medications  Medication Dose Route Frequency Provider Last Rate Last Admin  .  stroke: mapping our Dannah Ryles stages of recovery book   Does not apply Once Lenore Cordia, MD      . acetaminophen (TYLENOL) tablet 650 mg  650 mg Oral Q4H PRN Lenore Cordia, MD       Or  . acetaminophen (TYLENOL) 160 MG/5ML solution 650 mg  650 mg Per Tube Q4H PRN Lenore Cordia, MD       Or  . acetaminophen (TYLENOL) suppository 650 mg  650 mg Rectal Q4H PRN Zada Finders R, MD      . albuterol (PROVENTIL) (2.5 MG/3ML) 0.083% nebulizer solution 2.5 mg  2.5 mg Nebulization Q4H PRN Zada Finders R, MD      . aspirin chewable tablet 81 mg  81 mg Oral Daily Lenore Cordia, MD   81 mg at 10/21/20 0914  . atorvastatin (LIPITOR) tablet 40 mg  40 mg Oral Daily Rosalin Hawking, MD   40 mg at 10/21/20 0914  . heparin injection 5,000 Units  5,000 Units Subcutaneous Q8H Lenore Cordia, MD   5,000 Units at 10/21/20 1516  . ipratropium-albuterol (DUONEB) 0.5-2.5 (3) MG/3ML nebulizer solution 3 mL  3 mL Nebulization Q6H PRN Zada Finders R, MD      . labetalol (NORMODYNE) injection 10 mg  10 mg Intravenous Q4H PRN Patel, Vishal R, MD      . mometasone-formoterol (DULERA) 100-5 MCG/ACT inhaler 2 puff  2 puff Inhalation Q12H Patel, Vishal R, MD      . senna-docusate (Senokot-S) tablet 1 tablet  1 tablet Oral QHS PRN Lenore Cordia, MD        REVIEW OF SYSTEMS:  Reviewed and as history and physical with nothing to add  PHYSICAL EXAM: Vitals:   10/21/20 0930 10/21/20 1000  10/21/20 1029 10/21/20 1604  BP: (!) 176/67 (!) 180/65 (!) 184/70 (!) 152/48  Pulse: 81 80 83 80  Resp: 15 (!) 23 20 18   Temp:   98.1 F (36.7 C) 98.7 F (37.1 C)  TempSrc:   Oral Oral  SpO2: 98% 96% 99% 99%  Weight:   70.1 kg   Height:   5\' 6"  (1.676 m)     GENERAL: The patient is a well-nourished male, in no acute distress. The vital signs are documented above. PULMONARY: There is good air exchange  MUSCULOSKELETAL: There are no major deformities or cyanosis. NEUROLOGIC: No focal weakness or paresthesias are detected. SKIN: There are no ulcers or rashes noted. PSYCHIATRIC: The patient has a normal affect.  DATA:   CT angiogram reveals 90% left carotid bifurcation stenosis and 70% right carotid bifurcation stenosis.  Both of these are very calcified with some irregularity.  MRI of the brain shows scattered small acute cortical infarcts in the posterior left MCA distribution.  MEDICAL ISSUES:  Symptomatic left internal carotid artery stenosis.  I discussed this at great length with the patient.  I have recommended left carotid endarterectomy for reduction of stroke risk due to his symptomatic disease.  I explained that we typically do not treat this as a emergent event if there is not crescendo TIAs which she has not had.  There is concern regarding his hematologic issues.  Dr.Dorsey is speaking with the patient now to help guide plans for urgent surgery in the next several weeks.  We will follow with you   Rosetta Posner, MD [facility] phone [phone]

## 2020-10-21 NOTE — Evaluation (Signed)
Occupational Therapy Evaluation Patient Details Name: Patrick Rodriguez. MRN: [id number] DOB: 1925-03-09 Today's Date: 10/21/2020    History of Present Illness Pt is a 86 y/o male admitted secondary to expressive aphasia and L arm weakness. Symptoms have since improved. Imaging revealed L MCA infarcts. PMH includes MDS, HTN, COPD, CAD, and CKD.    Clinical Impression   This 86 y/o male presents with the above. PTA pt reports independence with ADL, iADL and functional mobility. Pt very pleasant and willing to participate in therapy session. He demonstrates mobility and standing grooming ADL tasks without AD at supervision level, requiring minguard assist for LB ADL. Trialled pt on RA during session (on 3L upon arrival) with SpO2 >/=90% throughout. Suspect pt is likely close to his baseline with ADL/functional tasks. Will continue to follow acutely however do not anticipate pt will require follow up OT services.     Follow Up Recommendations  No OT follow up;Supervision - Intermittent    Equipment Recommendations  None recommended by OT           Precautions / Restrictions Precautions Precautions: Fall Restrictions Weight Bearing Restrictions: No      Mobility Bed Mobility               General bed mobility comments: OOB upon arrival    Transfers Overall transfer level: Needs assistance Equipment used: None Transfers: Sit to/from Stand Sit to Stand: Supervision         General transfer comment: for balance and safety    Balance Overall balance assessment: No apparent balance deficits (not formally assessed)                                         ADL either performed or assessed with clinical judgement   ADL Overall ADL's : Needs assistance/impaired Eating/Feeding: Modified independent;Sitting   Grooming: Supervision/safety;Standing;Wash/dry hands   Upper Body Bathing: Modified independent;Sitting   Lower Body Bathing: Min guard;Sit  to/from stand   Upper Body Dressing : Modified independent;Sitting   Lower Body Dressing: Sit to/from stand;Min guard Lower Body Dressing Details (indicate cue type and reason): increased effort for socks today Toilet Transfer: Supervision/safety;Ambulation Toilet Transfer Details (indicate cue type and reason): simulated via transfer to/from recliner  Toileting- Clothing Manipulation and Hygiene: Supervision/safety;Sit to/from stand       Functional mobility during ADLs: Supervision/safety General ADL Comments: suspect pt likely close to his baseline - pt in agreement      Vision Baseline Vision/History: Wears glasses Wears Glasses: Reading only Patient Visual Report: No change from baseline Vision Assessment?: No apparent visual deficits     Perception     Praxis      Pertinent Vitals/Pain Pain Assessment: No/denies pain     Hand Dominance Right   Extremity/Trunk Assessment Upper Extremity Assessment Upper Extremity Assessment: Overall WFL for tasks assessed (grossly 4/5 throughout)   Lower Extremity Assessment Lower Extremity Assessment: Defer to PT evaluation   Cervical / Trunk Assessment Cervical / Trunk Assessment: Kyphotic   Communication Communication Communication: No difficulties   Cognition Arousal/Alertness: Awake/alert Behavior During Therapy: WFL for tasks assessed/performed Overall Cognitive Status: Within Functional Limits for tasks assessed                                     General Comments  pt  on 3L upon arrival to room, typically doesn't wear O2 so trialled without. SpO2 >/=90% on RA with activity; reapplied 3L end of session    Exercises     Shoulder Instructions      Home Living Family/patient expects to be discharged to:: Private residence Living Arrangements: Spouse/significant other Available Help at Discharge: Family;Available 24 hours/day Type of Home: House Home Access: Stairs to enter [employer]  of Steps: 2-3 Entrance Stairs-Rails: Right Home Layout: One level     Bathroom Shower/Tub: Tub/shower unit;Walk-in shower (uses walk-in)   Bathroom Toilet: Standard     Home Equipment: None          Prior Functioning/Environment Level of Independence: Independent                 OT Problem List: Decreased activity tolerance;Decreased strength;Decreased knowledge of use of DME or AE      OT Treatment/Interventions: Self-care/ADL training;Therapeutic exercise;Energy conservation;Neuromuscular education;DME and/or AE instruction;Therapeutic activities;Visual/perceptual remediation/compensation;Patient/family education;Balance training    OT Goals(Current goals can be found in the care plan section) Acute Rehab OT Goals Patient Stated Goal: to go home OT Goal Formulation: With patient Time For Goal Achievement: 11/04/20 Potential to Achieve Goals: Good  OT Frequency: Min 2X/week   Barriers to D/C:            Co-evaluation              AM-PAC OT "6 Clicks" Daily Activity     Outcome Measure Help from another person eating meals?: None Help from another person taking care of personal grooming?: A Little Help from another person toileting, which includes using toliet, bedpan, or urinal?: A Little Help from another person bathing (including washing, rinsing, drying)?: A Little Help from another person to put on and taking off regular upper body clothing?: None Help from another person to put on and taking off regular lower body clothing?: A Little 6 Click Score: 20   End of Session Equipment Utilized During Treatment: Oxygen Nurse Communication: Mobility status  Activity Tolerance: Patient tolerated treatment well Patient left: in chair;with call bell/phone within reach;with chair alarm set  OT Visit Diagnosis: Other symptoms and signs involving the nervous system (N02.725)                Time: 3664-4034 OT Time Calculation (min): 22 min Charges:  OT  General Charges $OT Visit: 1 Visit OT Evaluation $OT Eval Moderate Complexity: 1 Mod  Lou Cal, OT Acute Rehabilitation Services Pager [phone] Office [phone]  Raymondo Band 10/21/2020, 5:16 PM

## 2020-10-21 NOTE — ED Notes (Signed)
RN has asked Network engineer to page MD, r/t critical lab values

## 2020-10-21 NOTE — ED Notes (Signed)
Assisted patient with breakfast tray. Sitting up in bed eating. Denies any difficulty swallowing or chewing.

## 2020-10-21 NOTE — Telephone Encounter (Signed)
Received a call from patient.Stated he was admitted to [facility] hospital last night with a mild stroke.Stated he would like Dr.Jordan to speak to the Drs in hospital to make them aware of his history.Stated he cancelled appointment scheduled with Dr.Jordan 10/27/20.Advised I will make Dr.Jordan aware.

## 2020-10-21 NOTE — H&P (Signed)
History and Physical    Patrick Rodriguez. BMW:[id number] DOB: 1925/07/05 DOA: 10/20/2020  PCP: Maury Dus, MD  Patient coming from: Home via EMS  I have personally briefly reviewed patient's old medical records in [place]  Chief Complaint: Expressive aphasia  HPI: Patrick Sarli. is a 86 y.o. male with medical history significant for MDS/MPS (with associated leukocytosis, thrombocytosis, and anemia), CAD, CAS, CKD stage III, COPD, hypertension, and hard of hearing who presents to the ED for evaluation of expressive aphasia.  History is supplemented by patient's wife at bedside.  Per wife, patient was last known well until 2108 on 10/20/2020 when he had new onset of speech difficulty.  She says speech was incomprehensible.  Patient states he knew what he wanted to say but could not get the right words out.  He was able to understand family when spoken to.  EMS were called due to concern for stroke.  Per ED documentation, initial blood pressure by EMS was 272 systolic which increased to 536 systolic.  EMS noticed some mild left facial weakness as well as mild left arm weakness.  On arrival to the ED he was complaining of shortness of breath.  Per patient, he has been having some progressive dyspnea on exertion.  He was diagnosed with COPD by his pulmonologist and started on maintenance inhalers.  He is concerned about CHF as well but has no prior known history of congestive heart failure.  He has been receiving intermittent PRBC transfusions for symptomatic anemia by his hematologist.  Last transfusion was about 3 weeks ago and per wife, did not improve fatigue that he has been experiencing.  ED Course:  Initial vitals showed BP 192/68, pulse 96, RR 19, SPO2 100% on room air.  Labs significant for WBC 81.0, absolute neutrophils 51,400, platelets 2,016,000, hemoglobin 7.5, sodium 138, potassium 4.0, bicarb 22, BUN 23, creatinine 1.53, serum glucose 172, BNP 439.1.  Urinalysis is  negative for UTI.  UDS is negative.  Serum ethanol undetectable.  Patient was brought in as a code stroke and seen by neurology on arrival.  CT head without contrast showed chronic ischemic microangiopathy without acute intracranial abnormality.  CTA head/neck was negative for intracranial arterial occlusion or high-grade stenosis of the intracranial arteries.  Bilateral proximal ICA stenosis measuring 70% on the right and 90% on the left is noted.  Small pleural effusions seen.  Patient went for MRI brain however became hypoxic with reported SPO2 in the 70s and MRI study was not able to be completed.  Follow-up portable chest x-ray shows enlarged cardiac silhouette with bilateral interstitial and airspace opacities.  Patient was initially placed on nonrebreather and has since been transitioned to 2-3 L supplemental O2 via [place].  Goals of care were discussed with patient and he has made it clear his CODE STATUS is DNR.  The hospitalist service was consulted to admit for further evaluation and management.  Review of Systems: All systems reviewed and are negative except as documented in history of present illness above.   Past Medical History:  Diagnosis Date  . Allergic rhinitis   . Basal cell carcinoma 01/07/2020   left preauricular  . Basal cell carcinoma 06/25/2020   right tip of nose  . Carotid artery stenosis    12/2011:  50-69% on the left and 1-49% on the right;  Carotid US (11/2013): Bilateral 40-59%; repeat 1 year  . Cataract   . Coronary artery disease    a.  s/p stenting  of the LAD in 2000;  b. Lexiscan Myoview (12/2013): No ischemia, EF 70%; normal study  . ED (erectile dysfunction)   . GERD (gastroesophageal reflux disease)   . Hyperlipidemia   . Hyperplastic colon polyp 11/15/2013   x3  . Hypertension   . Myocardial infarction ([place])   . Normal echocardiogram Jan 2013   EF of 55% and no wall motion abnormalities  . Normal nuclear stress test 2008  . Obesity   .  Palpitations    Holter (11/2013): NSR, sinus brady, PVCs, rare blocked PAC, no sig arrhythmia  . Squamous cell carcinoma of skin 01/07/2020   right lat elbow  . Substance abuse ([place])   . Syncope Jan 2013   felt to be vasovagal; had normal echo, carotids ok, 1st degree AV block with mention (but no tracing) of 2nd degree type I AV block while in [place]  . Tubular adenoma 11/15/2013   x2    Past Surgical History:  Procedure Laterality Date  . APPENDECTOMY    . CARDIAC CATHETERIZATION  01/20/1999   EF 60%  . CARDIOVASCULAR STRESS TEST  04/19/2007   EF 74%  . CORONARY STENT PLACEMENT  2000   LAD  . DOPPLER ECHOCARDIOGRAPHY  02/06/1998   EF 60%  . TONSILLECTOMY      Social History:  reports that he quit smoking about 51 years ago. His smoking use included cigarettes. He has a 56.00 pack-year smoking history. He has never used smokeless tobacco. He reports that he does not drink alcohol and does not use drugs.  Allergies  Allergen Reactions  . Hctz [Hydrochlorothiazide]     Has had hyponatremia    Family History  Problem Relation Age of Onset  . Aneurysm Mother   . Stomach cancer Father   . Cancer Father   . Cancer Sister      Prior to Admission medications   Medication Sig Start Date End Date Taking? Authorizing Provider  amLODipine (NORVASC) 10 MG tablet TAKE 1 TABLET BY MOUTH DAILY 08/14/20   [place], Peter M, MD  Ascorbic Acid (VITAMIN C) 1000 MG tablet Take 1,000 mg by mouth daily.      [provider]  aspirin 81 MG tablet Take 81 mg by mouth daily.     [provider]  b complex vitamins tablet Take 1 tablet by mouth daily.      [provider]  doxazosin (CARDURA) 4 MG tablet TAKE 1 TABLET BY MOUTH DAILY 08/14/20   Almyra Deforest, PA  Dupilumab (DUPIXENT [place]) Inject into the skin every 14 (fourteen) days.    [provider]  [place] 300 MG/2ML SOPN  01/17/20   [provider]  magnesium gluconate (MAGONATE) 500 MG tablet Take 500 mg by  mouth daily.      [provider]  metoprolol succinate (TOPROL XL) 25 MG 24 hr tablet Take 1 tablet (25 mg total) by mouth daily. 03/11/20   [place], Peter M, MD  mometasone-formoterol [facility]) 100-5 MCG/ACT AERO Inhale 2 puffs into the lungs every 12 (twelve) hours. 09/04/20   Tanda Rockers, MD  Multiple Vitamins-Minerals (ZINC PO) Take by mouth.    [provider]  Nattokinase 100 MG CAPS Take by mouth daily.      [provider]  omeprazole (PRILOSEC) 40 MG capsule Take 40 mg by mouth daily.    [provider]  tranexamic acid (LYSTEDA) 650 MG TABS tablet Take 1 tablet (650 mg total) by mouth in the morning, at  noon, in the evening, and at bedtime. Dissolve 1 tablet in 10-15 ml of water, allow it to dissolve over 3-5 minutes. Swish in mouth x 2 minutes then spit. Repeat every 6 hours until bleeding stops, then continue for another 24 hours. 04/28/20   Orson Slick, MD  TURMERIC PO Take by mouth.    [provider]    Physical Exam: Vitals:   10/20/20 2221 10/20/20 2300 10/20/20 2333 10/21/20 0000  BP:  (!) 197/78 (!) 164/63 (!) 171/65  Pulse:  (!) 103 94 91  Resp: 19 19 20  (!) 21  SpO2:  100% 93% 93%  Weight:       Constitutional: Elderly man resting in bed with head elevated, NAD, calm, comfortable Eyes: PERRL, EOMI, lids and conjunctivae normal ENMT: Mucous membranes are moist. Posterior pharynx clear of any exudate or lesions.Normal dentition.  Somewhat hard of hearing. Neck: normal, supple, no masses. Respiratory: Coarse expiratory wheezing diffuse lung fields with inspiratory bibasilar crackles.  Normal respiratory effort. No accessory muscle use.  Cardiovascular: Regular rate and rhythm, no murmurs / rubs / gallops. No extremity edema. 2+ pedal pulses. Abdomen: no tenderness, no masses palpated. No hepatosplenomegaly. Bowel sounds positive.  Musculoskeletal: no clubbing / cyanosis. No joint deformity upper and lower extremities. Good  ROM, no contractures. Normal muscle tone.  Skin: no rashes, lesions, ulcers. No induration Neurologic: CN 2-12 grossly intact. Sensation intact,  Strength 5/5 in all 4.  Psychiatric: Normal judgment and insight. Alert and oriented x 3. Normal mood.   Labs on Admission: I have personally reviewed following labs and imaging studies  CBC: Recent Labs  Lab 10/20/20 2202 10/20/20 2209 10/20/20 2319  WBC 81.0*  --   --   NEUTROABS 51.4*  --   --   HGB 7.5* 8.2* 9.2*  HCT 23.7* 24.0* 27.0*  MCV 92.6  --   --   PLT 2,016*  --   --    Basic Metabolic Panel: Recent Labs  Lab 10/20/20 2202 10/20/20 2209 10/20/20 2319  NA 138 138 139  K 4.0 4.0 4.1  CL 106 105  --   CO2 22  --   --   GLUCOSE 172* 174*  --   BUN 23 24*  --   CREATININE 1.53* 1.70*  --   CALCIUM 9.0  --   --    GFR: Estimated Creatinine Clearance: 24.5 mL/min (A) (by C-G formula based on SCr of 1.7 mg/dL (H)). Liver Function Tests: Recent Labs  Lab 10/20/20 2202  AST 32  ALT 22  ALKPHOS 57  BILITOT 0.4  PROT 6.1*  ALBUMIN 3.2*   No results for input(s): LIPASE, AMYLASE in the last 168 hours. No results for input(s): AMMONIA in the last 168 hours. Coagulation Profile: Recent Labs  Lab 10/20/20 2202  INR 1.2   Cardiac Enzymes: No results for input(s): CKTOTAL, CKMB, CKMBINDEX, TROPONINI in the last 168 hours. BNP (last 3 results) No results for input(s): PROBNP in the last 8760 hours. HbA1C: No results for input(s): HGBA1C in the last 72 hours. CBG: No results for input(s): GLUCAP in the last 168 hours. Lipid Profile: No results for input(s): CHOL, HDL, LDLCALC, TRIG, CHOLHDL, LDLDIRECT in the last 72 hours. Thyroid Function Tests: No results for input(s): TSH, T4TOTAL, FREET4, T3FREE, THYROIDAB in the last 72 hours. Anemia Panel: No results for input(s): VITAMINB12, FOLATE, FERRITIN, TIBC, IRON, RETICCTPCT in the last 72 hours. Urine analysis:    Component Value Date/Time   COLORURINE  YELLOW  10/20/2020 [place] 10/20/2020 2202   LABSPEC 1.031 (H) 10/20/2020 2202   PHURINE 5.0 10/20/2020 [place] 10/20/2020 2202   HGBUR NEGATIVE 10/20/2020 2202   BILIRUBINUR NEGATIVE 10/20/2020 2202   BILIRUBINUR neg 06/19/2015 [place] 10/20/2020 2202   PROTEINUR 100 (A) 10/20/2020 2202   UROBILINOGEN 0.2 06/19/2015 1139   UROBILINOGEN 0.2 08/11/2011 1447   NITRITE NEGATIVE 10/20/2020 2202   LEUKOCYTESUR NEGATIVE 10/20/2020 2202    Radiological Exams on Admission: CT Code Stroke CTA Head W/WO contrast  Result Date: 10/20/2020 CLINICAL DATA:  Left-sided weakness and aphasia EXAM: CT ANGIOGRAPHY HEAD AND NECK CT PERFUSION BRAIN TECHNIQUE: Multidetector CT imaging of the head and neck was performed using the standard protocol during bolus administration of intravenous contrast. Multiplanar CT image reconstructions and MIPs were obtained to evaluate the vascular anatomy. Carotid stenosis measurements (when applicable) are obtained utilizing NASCET criteria, using the distal internal carotid diameter as the denominator. Multiphase CT imaging of the brain was performed following IV bolus contrast injection. Subsequent parametric perfusion maps were calculated using RAPID software. CONTRAST:  1100mL OMNIPAQUE IOHEXOL 350 MG/ML SOLN COMPARISON:  None. FINDINGS: CTA NECK FINDINGS SKELETON: There is no bony spinal canal stenosis. No lytic or blastic lesion. OTHER NECK: Normal pharynx, larynx and major salivary glands. No cervical lymphadenopathy. Unremarkable thyroid gland. UPPER CHEST: Small pleural effusions and multiple upper mediastinal lymph nodes. AORTIC ARCH: There is calcific atherosclerosis of the aortic arch. There is no aneurysm, dissection or hemodynamically significant stenosis of the visualized portion of the aorta. Conventional 3 vessel aortic branching pattern. The visualized proximal subclavian arteries are widely patent. RIGHT CAROTID SYSTEM:  No dissection, occlusion or aneurysm. There is mixed density atherosclerosis extending into the proximal ICA, resulting in 70% stenosis. LEFT CAROTID SYSTEM: No dissection, occlusion or aneurysm. There is predominantly calcified atherosclerosis extending into the proximal ICA, resulting in approximately 90 % stenosis. VERTEBRAL ARTERIES: Left dominant configuration. Both origins are clearly patent. There is no dissection, occlusion or flow-limiting stenosis to the skull base (V1-V3 segments). CTA HEAD FINDINGS POSTERIOR CIRCULATION: --Vertebral arteries: Normal V4 segments. --Inferior cerebellar arteries: Normal. --Basilar artery: Normal. --Superior cerebellar arteries: Normal. --Posterior cerebral arteries (PCA): Normal. The right PCA is predominantly supplied by the posterior communicating artery. Left P-comm is also present. ANTERIOR CIRCULATION: --Intracranial internal carotid arteries: Normal. --Anterior cerebral arteries (ACA): Normal. Both A1 segments are present. Patent anterior communicating artery (a-comm). --Middle cerebral arteries (MCA): Normal. VENOUS SINUSES: As permitted by contrast timing, patent. ANATOMIC VARIANTS: None Review of the MIP images confirms the above findings. CT Brain Perfusion Findings: Contrast bolus was deemed insufficient by the RAPID software. Manual post processing of the data will be attempted and, if successful, an addendum will be issued. IMPRESSION: 1. No intracranial arterial occlusion or high-grade stenosis of the intracranial arteries. 2. Bilateral proximal internal carotid artery stenoses, measuring 70 % on the right and 90 % on the left. 3. Small pleural effusions and multiple upper mediastinal lymph nodes. 4. Contrast bolus was deemed insufficient by the RAPID software. Manual post processing of the data will be attempted and, if successful, an addendum will be issued. Aortic Atherosclerosis (ICD10-I70.0). Electronically Signed   By: Ulyses Jarred M.D.   On: 10/20/2020  23:20   CT Code Stroke CTA Neck W/WO contrast  Result Date: 10/20/2020 CLINICAL DATA:  Left-sided weakness and aphasia EXAM: CT ANGIOGRAPHY HEAD AND NECK CT PERFUSION BRAIN TECHNIQUE: Multidetector CT imaging of the head  and neck was performed using the standard protocol during bolus administration of intravenous contrast. Multiplanar CT image reconstructions and MIPs were obtained to evaluate the vascular anatomy. Carotid stenosis measurements (when applicable) are obtained utilizing NASCET criteria, using the distal internal carotid diameter as the denominator. Multiphase CT imaging of the brain was performed following IV bolus contrast injection. Subsequent parametric perfusion maps were calculated using RAPID software. CONTRAST:  156mL OMNIPAQUE IOHEXOL 350 MG/ML SOLN COMPARISON:  None. FINDINGS: CTA NECK FINDINGS SKELETON: There is no bony spinal canal stenosis. No lytic or blastic lesion. OTHER NECK: Normal pharynx, larynx and major salivary glands. No cervical lymphadenopathy. Unremarkable thyroid gland. UPPER CHEST: Small pleural effusions and multiple upper mediastinal lymph nodes. AORTIC ARCH: There is calcific atherosclerosis of the aortic arch. There is no aneurysm, dissection or hemodynamically significant stenosis of the visualized portion of the aorta. Conventional 3 vessel aortic branching pattern. The visualized proximal subclavian arteries are widely patent. RIGHT CAROTID SYSTEM: No dissection, occlusion or aneurysm. There is mixed density atherosclerosis extending into the proximal ICA, resulting in 70% stenosis. LEFT CAROTID SYSTEM: No dissection, occlusion or aneurysm. There is predominantly calcified atherosclerosis extending into the proximal ICA, resulting in approximately 90 % stenosis. VERTEBRAL ARTERIES: Left dominant configuration. Both origins are clearly patent. There is no dissection, occlusion or flow-limiting stenosis to the skull base (V1-V3 segments). CTA HEAD FINDINGS  POSTERIOR CIRCULATION: --Vertebral arteries: Normal V4 segments. --Inferior cerebellar arteries: Normal. --Basilar artery: Normal. --Superior cerebellar arteries: Normal. --Posterior cerebral arteries (PCA): Normal. The right PCA is predominantly supplied by the posterior communicating artery. Left P-comm is also present. ANTERIOR CIRCULATION: --Intracranial internal carotid arteries: Normal. --Anterior cerebral arteries (ACA): Normal. Both A1 segments are present. Patent anterior communicating artery (a-comm). --Middle cerebral arteries (MCA): Normal. VENOUS SINUSES: As permitted by contrast timing, patent. ANATOMIC VARIANTS: None Review of the MIP images confirms the above findings. CT Brain Perfusion Findings: Contrast bolus was deemed insufficient by the RAPID software. Manual post processing of the data will be attempted and, if successful, an addendum will be issued. IMPRESSION: 1. No intracranial arterial occlusion or high-grade stenosis of the intracranial arteries. 2. Bilateral proximal internal carotid artery stenoses, measuring 70 % on the right and 90 % on the left. 3. Small pleural effusions and multiple upper mediastinal lymph nodes. 4. Contrast bolus was deemed insufficient by the RAPID software. Manual post processing of the data will be attempted and, if successful, an addendum will be issued. Aortic Atherosclerosis (ICD10-I70.0). Electronically Signed   By: Ulyses Jarred M.D.   On: 10/20/2020 23:20   CT Code Stroke Cerebral Perfusion with contrast  Result Date: 10/20/2020 CLINICAL DATA:  Left-sided weakness and aphasia EXAM: CT ANGIOGRAPHY HEAD AND NECK CT PERFUSION BRAIN TECHNIQUE: Multidetector CT imaging of the head and neck was performed using the standard protocol during bolus administration of intravenous contrast. Multiplanar CT image reconstructions and MIPs were obtained to evaluate the vascular anatomy. Carotid stenosis measurements (when applicable) are obtained utilizing NASCET  criteria, using the distal internal carotid diameter as the denominator. Multiphase CT imaging of the brain was performed following IV bolus contrast injection. Subsequent parametric perfusion maps were calculated using RAPID software. CONTRAST:  16mL OMNIPAQUE IOHEXOL 350 MG/ML SOLN COMPARISON:  None. FINDINGS: CTA NECK FINDINGS SKELETON: There is no bony spinal canal stenosis. No lytic or blastic lesion. OTHER NECK: Normal pharynx, larynx and major salivary glands. No cervical lymphadenopathy. Unremarkable thyroid gland. UPPER CHEST: Small pleural effusions and multiple upper mediastinal lymph nodes. AORTIC ARCH: There  is calcific atherosclerosis of the aortic arch. There is no aneurysm, dissection or hemodynamically significant stenosis of the visualized portion of the aorta. Conventional 3 vessel aortic branching pattern. The visualized proximal subclavian arteries are widely patent. RIGHT CAROTID SYSTEM: No dissection, occlusion or aneurysm. There is mixed density atherosclerosis extending into the proximal ICA, resulting in 70% stenosis. LEFT CAROTID SYSTEM: No dissection, occlusion or aneurysm. There is predominantly calcified atherosclerosis extending into the proximal ICA, resulting in approximately 90 % stenosis. VERTEBRAL ARTERIES: Left dominant configuration. Both origins are clearly patent. There is no dissection, occlusion or flow-limiting stenosis to the skull base (V1-V3 segments). CTA HEAD FINDINGS POSTERIOR CIRCULATION: --Vertebral arteries: Normal V4 segments. --Inferior cerebellar arteries: Normal. --Basilar artery: Normal. --Superior cerebellar arteries: Normal. --Posterior cerebral arteries (PCA): Normal. The right PCA is predominantly supplied by the posterior communicating artery. Left P-comm is also present. ANTERIOR CIRCULATION: --Intracranial internal carotid arteries: Normal. --Anterior cerebral arteries (ACA): Normal. Both A1 segments are present. Patent anterior communicating artery  (a-comm). --Middle cerebral arteries (MCA): Normal. VENOUS SINUSES: As permitted by contrast timing, patent. ANATOMIC VARIANTS: None Review of the MIP images confirms the above findings. CT Brain Perfusion Findings: Contrast bolus was deemed insufficient by the RAPID software. Manual post processing of the data will be attempted and, if successful, an addendum will be issued. IMPRESSION: 1. No intracranial arterial occlusion or high-grade stenosis of the intracranial arteries. 2. Bilateral proximal internal carotid artery stenoses, measuring 70 % on the right and 90 % on the left. 3. Small pleural effusions and multiple upper mediastinal lymph nodes. 4. Contrast bolus was deemed insufficient by the RAPID software. Manual post processing of the data will be attempted and, if successful, an addendum will be issued. Aortic Atherosclerosis (ICD10-I70.0). Electronically Signed   By: Ulyses Jarred M.D.   On: 10/20/2020 23:20   DG Chest Port 1 View  Result Date: 10/20/2020 CLINICAL DATA:  Shortness of breath EXAM: PORTABLE CHEST 1 VIEW COMPARISON:  12/13/2017 FINDINGS: Cardiomegaly. Bilateral interstitial and airspace opacities, likely edema. No effusions. No acute bony abnormality. IMPRESSION: Bilateral interstitial and airspace opacities, most likely edema/CHF. Electronically Signed   By: Rolm Baptise M.D.   On: 10/20/2020 23:05   CT HEAD CODE STROKE WO CONTRAST  Result Date: 10/20/2020 CLINICAL DATA:  Code stroke.  Left-sided weakness and aphasia EXAM: CT HEAD WITHOUT CONTRAST TECHNIQUE: Contiguous axial images were obtained from the base of the skull through the vertex without intravenous contrast. COMPARISON:  None. FINDINGS: Brain: There is no mass, hemorrhage or extra-axial collection. The size and configuration of the ventricles and extra-axial CSF spaces are normal. There is hypoattenuation of the periventricular white matter, most commonly indicating chronic ischemic microangiopathy. Vascular: No  abnormal hyperdensity of the major intracranial arteries or dural venous sinuses. No intracranial atherosclerosis. Skull: The visualized skull base, calvarium and extracranial soft tissues are normal. Sinuses/Orbits: No fluid levels or advanced mucosal thickening of the visualized paranasal sinuses. No mastoid or middle ear effusion. The orbits are normal. ASPECTS [facility] Stroke Program Early CT Score) - Ganglionic level infarction (caudate, lentiform nuclei, internal capsule, insula, M1-M3 cortex): 7 - Supraganglionic infarction (M4-M6 cortex): 3 Total score (0-10 with 10 being normal): 10 IMPRESSION: 1. Chronic ischemic microangiopathy without acute intracranial abnormality. 2. ASPECTS is 10. These results were communicated to Dr. Amie Portland at 10:16 pm on 10/20/2020 by text page via the [facility] messaging system. Electronically Signed   By: Ulyses Jarred M.D.   On: 10/20/2020 22:16    EKG: Personally  reviewed. Sinus rhythm with PACs.  Prior EKG showed sinus rhythm with first-degree AV block.  Assessment/Plan Principal Problem:   Left-sided weakness Active Problems:   Essential hypertension   COPD GOLD I   CAD (coronary artery disease)   Anemia   Myelodysplastic syndrome (HCC)   Leukocytosis   Thrombocytosis   Acute respiratory failure with hypoxia (HCC)   CKD (chronic kidney disease) stage 3, GFR 30-59 ml/min (HCC)  Patrick Rodriguez. is a 86 y.o. male with medical history significant for MDS/MPS (with associated leukocytosis, thrombocytosis, and anemia), CAD, CAS, CKD stage III, COPD, hypertension, and hard of hearing who is admitted for evaluation of acute left-sided weakness and expressive aphasia.  Left-sided weakness and expressive aphasia: Patient reports main symptoms of expressive aphasia however was reported to have mild left facial and left arm weakness by EMS.  Has a mild expressive aphasia on admission with 5/5 strength all extremities. -CT head negative for acute changes -CTA  head and neck without emergent LVO, b/l proximal ICA stenosis seen, 70% on the right and 90% on the left -MRI brain without contrast when able to tolerate -Continue aspirin 81 mg -Echocardiogram -PT/OT/SLP -Telemetry, neurochecks -Neurology following  Acute respiratory failure with hypoxia: Reported SPO2 in the 70s when lying flat for MRI.  Currently requiring 2 L O2 via .  Suspect component of pulmonary edema as well as COPD contributing. -IV Lasix 40 mg once now -Resume home Dulera with as needed albuterol and duo nebs -Wean supplemental O2 as able -Echocardiogram as above -Strict I/O's  Myelodysplastic syndrome/myeloproliferative syndrome: Presenting with associated severe thrombocytosis and leukocytosis, worsened from previous labs.  At risk for thrombotic events due to severe thrombocytosis.  He has mild anemia as well.  Receives intermittent PRBC transfusions for symptomatic anemia.  Follows with hematology, Dr. Lorenso Courier and per last office note is being managed on a comfort based approach.  He did not want to proceed with any hypoventilatory/chemotherapy based treatments. -Continue aspirin 81 mg daily as above -Follow labs -EDP has notified hematology  Carotid artery stenosis: CTA head and neck shows b/l proximal ICA stenosis seen, 70% on the right and 90% on the left.  Worsened compared prior carotid ultrasound 2 years ago.  COPD: Continue Dulera, DuoNeb's, albuterol as needed as above.  Wean oxygen as able.  CAD: Denies chest pain.  Continue aspirin.  Not on statin as an outpatient.  CKD stage III: Chronic appears stable.  Hypertension: BP elevated, allowing for permissive hypertension for now pending MRI brain to rule out acute CVA.  Use labetalol as needed.  DVT prophylaxis: Subcutaneous heparin Code Status: DNR, confirmed with patient Family Communication: Discussed with patient spouse at bedside Disposition Plan: From home, dispo pending further CVA work-up,  management of acute hypoxic respiratory failure, and hematology evaluation Consults called: Neurology following, hematology notified per EDP Admission status:  Status is: Observation  The patient remains OBS appropriate and will d/c before 2 midnights.  Dispo: The patient is from: Home              Anticipated d/c is to: Home              Anticipated d/c date is: 3 days              Patient currently is not medically stable to d/c.   Zada Finders MD [facility]  If 7PM-7AM, please contact night-coverage [email or web address]  10/21/2020, 12:32 AM

## 2020-10-21 NOTE — Telephone Encounter (Signed)
Patient called to advise that he is inpatient at [facility] because of a slight stroke.  He was supposed to have visit this Thursday which he needs to cancel.  Canceled and advised to call back when he gets out of hospital to reschedule.  Patient asked though if Dr. Lorenso Courier could come to [facility] and evaluate him and coordinate care with his hospitalitis because his recent labs are concerning.  Routed to MD.

## 2020-10-21 NOTE — ED Notes (Signed)
MD Swayze, AVA returned paged and made aware of critical labs, no new orders at this time

## 2020-10-21 NOTE — ED Notes (Signed)
Report called to Rod Holler, denies any questions. Preparing pt for transport to room 19 on 3W.

## 2020-10-21 NOTE — Evaluation (Signed)
Physical Therapy Evaluation Patient Details Name: Patrick Rodriguez. MRN: [id number] DOB: 08/30/1925 Today's Date: 10/21/2020   History of Present Illness  Pt is a 86 y/o male admitted secondary to expressive aphasia and L arm weakness. Symptoms have since improved. Imaging revealed L MCA infarcts. PMH includes MDS, HTN, COPD, CAD, and CKD.   Clinical Impression  Pt admitted secondary to problem above with deficits below. Pt requiring min guard A to stand and ambulate in ED room this session. Mild unsteadiness noted, however, no overt LOB. Pt reports symptoms have resolved since admission. Reports his wife will be able to assist at d/c. Feel pt would benefit from HHPT at d/c. Will continue to follow acutely.     Follow Up Recommendations Home health PT    Equipment Recommendations  Other (comment) (TBD)    Recommendations for Other Services       Precautions / Restrictions Precautions Precautions: Fall Restrictions Weight Bearing Restrictions: No      Mobility  Bed Mobility Overal bed mobility: Needs Assistance Bed Mobility: Supine to Sit;Sit to Supine     Supine to sit: Min assist Sit to supine: Supervision   General bed mobility comments: Min A for trunk elevation to come to sitting on stretcher. Supervision for return to supine.     Transfers Overall transfer level: Needs assistance Equipment used: 1 person hand held assist Transfers: Sit to/from Stand Sit to Stand: Min guard         General transfer comment: Min guard for safety.   Ambulation/Gait Ambulation/Gait assistance: Min guard Gait Distance (Feet): 10 Feet Assistive device: 1 person hand held assist Gait Pattern/deviations: Step-through pattern;Decreased stride length     General Gait Details: Mild instability noted during gait within the ED room. No overt LOB noted. Min guard for safety. Further distance limited as pt connected to oxygen on wall with no portable tanks.   Stairs             Wheelchair Mobility    Modified Rankin (Stroke Patients Only) Modified Rankin (Stroke Patients Only) Pre-Morbid Rankin Score: No symptoms Modified Rankin: Moderately severe disability     Balance Overall balance assessment: Needs assistance Sitting-balance support: No upper extremity supported;Feet supported Sitting balance-Leahy Scale: Good     Standing balance support: Single extremity supported;No upper extremity supported;During functional activity Standing balance-Leahy Scale: Fair Standing balance comment: Able to maintain static standing without UE supprot                              Pertinent Vitals/Pain Pain Assessment: No/denies pain    Home Living Family/patient expects to be discharged to:: Private residence Living Arrangements: Spouse/significant other Available Help at Discharge: Family;Available 24 hours/day Type of Home: House Home Access: Stairs to enter Entrance Stairs-Rails: Right Entrance Stairs-Number of Steps: 2-3 Home Layout: One level Home Equipment: None      Prior Function Level of Independence: Independent               Hand Dominance        Extremity/Trunk Assessment   Upper Extremity Assessment Upper Extremity Assessment: Defer to OT evaluation    Lower Extremity Assessment Lower Extremity Assessment: LLE deficits/detail;RLE deficits/detail RLE Deficits / Details: Grossly 4/5 throughout LLE Deficits / Details: Grossly 4/5 throughout     Cervical / Trunk Assessment Cervical / Trunk Assessment: Kyphotic  Communication   Communication: No difficulties  Cognition Arousal/Alertness: Awake/alert Behavior During Therapy: [facility]  for tasks assessed/performed Overall Cognitive Status: Within Functional Limits for tasks assessed                                 General Comments: [facility] for simple tasks. Did not assess formally      General Comments      Exercises     Assessment/Plan    PT  Assessment Patient needs continued PT services  PT Problem List Decreased strength;Decreased balance;Decreased mobility       PT Treatment Interventions DME instruction;Gait training;Stair training;Functional mobility training;Therapeutic activities;Therapeutic exercise;Balance training;Patient/family education    PT Goals (Current goals can be found in the Care Plan section)  Acute Rehab PT Goals Patient Stated Goal: to go home PT Goal Formulation: With patient Time For Goal Achievement: 11/04/20 Potential to Achieve Goals: Good    Frequency Min 4X/week   Barriers to discharge        Co-evaluation               AM-PAC PT "6 Clicks" Mobility  Outcome Measure Help needed turning from your back to your side while in a flat bed without using bedrails?: None Help needed moving from lying on your back to sitting on the side of a flat bed without using bedrails?: A Little Help needed moving to and from a bed to a chair (including a wheelchair)?: A Little Help needed standing up from a chair using your arms (e.g., wheelchair or bedside chair)?: A Little Help needed to walk in hospital room?: A Little Help needed climbing 3-5 steps with a railing? : A Little 6 Click Score: 19    End of Session Equipment Utilized During Treatment: Gait belt Activity Tolerance: Patient tolerated treatment well Patient left: in bed;with call bell/phone within reach;with nursing/sitter in room (on stretcher in ED with NT in room) Nurse Communication: Mobility status PT Visit Diagnosis: Unsteadiness on feet (R26.81);Muscle weakness (generalized) (M62.81)    Time: 1001-1013 PT Time Calculation (min) (ACUTE ONLY): 12 min   Charges:   PT Evaluation $PT Eval Low Complexity: 1 Low          Lou Miner, DPT  Acute Rehabilitation Services  Pager: [phone] Office: [phone]   Rudean Hitt 10/21/2020, 11:58 AM

## 2020-10-21 NOTE — Progress Notes (Addendum)
STROKE TEAM PROGRESS NOTE    Interval History   NAEON, stroke work up is on going. At the time of assessing the patient transport arrives to take him for his echocardiogram   Pertinent Lab Work and Imaging    CT Head WO IV Contrast 10/20/20 1. Chronic ischemic microangiopathy without acute intracranial abnormality. 2. ASPECTS is 10. CT Angio Head and Neck W WO IV Contrast 10/20/20 1. No intracranial arterial occlusion or high-grade stenosis of the intracranial arteries. 2. Bilateral proximal internal carotid artery stenoses, measuring 70 % on the right and 90 % on the left. 3. Small pleural effusions and multiple upper mediastinal lymph Nodes.  MRI Brain WO IV Contrast 10/21/20 1. Scattered small acute cortical infarcts in the posterior left MCA territory. 2. Age congruent senescent changes. 3. Lower than expected marrow signal, please correlate with CBC.  Echocardiogram Complete 10/21/20 Left Ventricle: Left ventricular ejection fraction, by estimation, is 55  to 60%. The left ventricle has normal function. The left ventricle has no  regional wall motion abnormalities. The left ventricular internal cavity  size was normal in size. There is  no left ventricular hypertrophy. Left ventricular diastolic parameters  are consistent with Grade II diastolic dysfunction (pseudonormalization).   Right Ventricle: The right ventricular size is normal. No increase in  right ventricular wall thickness. Right ventricular systolic function is  normal. There is moderately elevated pulmonary artery systolic pressure.  The tricuspid regurgitant velocity is  3.09 m/s, and with an assumed right atrial pressure of 8 mmHg, the  estimated right ventricular systolic pressure is 50.3 mmHg.   Left Atrium: Left atrial size was severely dilated.   Right Atrium: Right atrial size was mildly dilated.   Pericardium: There is no evidence of pericardial effusion.   Mitral Valve: The mitral valve is  normal in structure. Moderate mitral  annular calcification. Mild to moderate mitral valve regurgitation. Mild  mitral valve stenosis. The mean mitral valve gradient is 5.5 mmHg with  average heart rate of 83 bpm.   Tricuspid Valve: The tricuspid valve is normal in structure. Tricuspid  valve regurgitation is trivial. No evidence of tricuspid stenosis.   Aortic Valve: The aortic valve is abnormal. There is moderate  calcification of the aortic valve. Aortic valve regurgitation is trivial.  No aortic stenosis is present.   Pulmonic Valve: The pulmonic valve was not well visualized. Pulmonic valve  regurgitation is trivial. No evidence of pulmonic stenosis.   Aorta: The aortic root is normal in size and structure.   Venous: The inferior vena cava is normal in size with less than 50%  respiratory variability, suggesting right atrial pressure of 8 mmHg.   IAS/Shunts: Cannot exclude atrial level shunt by color flow Doppler,  consider agitated saline exam.   Physical Examination   Constitutional: Calm, appropriate for condition  Cardiovascular: Normal RR Respiratory: No increased WOB   Mental status: AAOx4, following commands  Speech: Fluent with repetition and naming intact. No dysarthria  Cranial nerves: EOMI, VFF, Face symmetric  Motor: Normal bulk and tone. No drift. Full strength throughout.  Sensory: Intact to light tough throughout  Coordination: FNF intact Reflexes: Not assessed Gait: Deferred   NIHSS: 0   Assessment and Plan  COPD, basal cell cancer, bilateral carotid stenosis, coronary artery disease, hypertension, history of MI, history of syncope, leukocytosis and thrombocytosis-myelodysplastic syndrome, presented to the emergency room from EMS as a code stroke for sudden onset of confusion, left-sided weakness and aphasia. NIHSS in the ED was 2,  too low to receive IVTPA. Thrombectomy was not performed due to lack of LVO.   #Patchy L MCA stroke in the setting of LAA   Patient presented with the symptoms described above. At this time, his stroke work up is complete. CTA Head and Neck was pertinent for bilateral carotid stenosis up to 90 % on the left and 70 % on the right. Echocardiogram w/EF 55 to 60 %, left atrium severely dilated, normal right ventricular size and no intracardiac source of stroke. LDL 115 A1C 6.1 MRI Brain showed patchy acute cortical stroke in the posterior L MCA territory. He was started on ASA 81 mg and Atorvastatin 40 mg for secondary stroke prevention. It is recommended to consult vascular surgery given his L MCA stroke is likely in the setting of carotid stenosis.  -Continue ASA 81 mg for secondary stroke prevention -Continue Atorvastatin 40 mg for secondary stroke prevention  - Consult Vascular Surgery for possible intervention  -At discharge please place ambulatory referral to neurology for stroke follow up   #Hypertension He has a history of HTN and takes Amlodipine 10 mg QD at home. Currently blood pressure is trending in the 150-190 range. Recommend permissive hypertension 48 hour post stroke, only treating SBP >220. After the permissive HTN window is over, given his stenosis he should have a liberal blood pressure goal to treat for SBP > 180.  - Hold home anti hypertensives - Permissive HTN   #Hyperlipidemia From a stroke prevention stand point, the LDL goal is < 70. His LDL is 115. Atorvastatin 40 mg started this admission for secondary stroke prevention.   #Prediabetes  Hemoglobin A1C this admission noted to be 6.1. He should follow up with his outpatient PCP for prediabetes management. From a stroke stand point A1C goal is < 7 and he is at that.   Hospital day # 0  [name], NP  [facility] Nurse Practitioner Patient seen and discussed with attending physician Dr. [name]   ATTENDING NOTE: I reviewed above note and agree with the assessment and plan. Pt was seen and examined.   86 year old male with history  of CAD/MI, hypertension, carotid stenosis, MDS admitted for episode of confusion, left-sided weakness and aphasia.  CT no acute abnormality.  CT head and neck left ICA 90% stenosis, right ICA 70% stenosis.  MRI showed left MCA scattered infarcts.  EF 55 to 60%.  A1c 6.1, LDL 115.  Creatinine 1.5, WBC 96.9, platelet 2111, hemoglobin 8.0.  On exam, patient awake alert, sitting in chair, fully orientated.  Neurologically intact.  Etiology for patient stroke likely due to left ICA high-grade stenosis.  However his MDS with leukocytosis and thrombocytosis could also be contributing factors.  Recommend vascular surgery consultation to consider left CEA versus stenting.  However patient seems hesitant given his age and other comorbidities including MDS.  Dr. Donnetta Hutching is on board and discussing with oncology to see if patient could be a candidate.  Currently on aspirin 81 and Lipitor 40.  Would consider DAPT if okay with vascular surgery and oncology.  Patient BP still on the higher end, okay to resume home amlodipine.  BP goal 130-150 given bilateral carotid stenosis.  Continue statin.  Will follow  Rosalin Hawking, MD PhD Stroke Neurology 10/21/2020 7:35 PM     To contact Stroke Continuity provider, please refer to [email or web address] After hours, contact General Neurology

## 2020-10-21 NOTE — H&P (View-Only) (Signed)
Vascular and Vein Specialist   Patient name: Patrick Rodriguez. MRN: [id number] DOB: 1925/09/19 Sex: male  REASON FOR CONSULT: Evaluation symptomatic carotid stenosis  HPI: Patrick Rodriguez. is a 86 y.o. male, admitted through the emergency room with stroke symptoms.  He is an extremely active 86 year old gentleman.  He was at home with his wife and was having some confusion and difficulty with the remote to change the television.  He also was having difficulty forming words with expressive aphasia.  EMS was called and he was transferred to [facility].  There was some initial question of left arm weakness but this resolved in route.  Patient had complete resolution of his symptoms aside from some mild word searching noted by neurology evaluation.  Currently he is sitting up in his room.  He is conversant and completely lucid.  He feels he is completely back to his normal baseline.  He did have a known history of moderate carotid disease which is been followed in the past with ultrasound and had never had any stroke symptoms in the past.  Has a remote history of coronary angioplasty.  Does have a history now of anemia felt to be secondary to myelodysplastic syndrome.  Past Medical History:  Diagnosis Date  . Allergic rhinitis   . Basal cell carcinoma 01/07/2020   left preauricular  . Basal cell carcinoma 06/25/2020   right tip of nose  . Carotid artery stenosis    12/2011:  50-69% on the left and 1-49% on the right;  Carotid US (11/2013): Bilateral 40-59%; repeat 1 year  . Cataract   . Coronary artery disease    a.  s/p stenting of the LAD in 2000;  b. Lexiscan Myoview (12/2013): No ischemia, EF 70%; normal study  . ED (erectile dysfunction)   . GERD (gastroesophageal reflux disease)   . Hyperlipidemia   . Hyperplastic colon polyp 11/15/2013   x3  . Hypertension   . Myocardial infarction ([place])   . Normal echocardiogram Jan 2013   EF of 55% and no wall motion abnormalities  .  Normal nuclear stress test 2008  . Obesity   . Palpitations    Holter (11/2013): NSR, sinus brady, PVCs, rare blocked PAC, no sig arrhythmia  . Squamous cell carcinoma of skin 01/07/2020   right lat elbow  . Substance abuse ([place])   . Syncope Jan 2013   felt to be vasovagal; had normal echo, carotids ok, 1st degree AV block with mention (but no tracing) of 2nd degree type I AV block while in [place]  . Tubular adenoma 11/15/2013   x2    Family History  Problem Relation Age of Onset  . Aneurysm Mother   . Stomach cancer Father   . Cancer Father   . Cancer Sister     SOCIAL HISTORY: Social History   Socioeconomic History  . Marital status: Married    Spouse name: Not on file  . Number of children: Y  . Years of education: Not on file  . Highest education level: Not on file  Occupational History  . Occupation: traveling paster  Tobacco Use  . Smoking status: Former Smoker    Packs/day: 2.00    Years: 28.00    Pack years: 56.00    Types: Cigarettes    Quit date: 12/06/1968    Years since quitting: 51.9  . Smokeless tobacco: Never Used  Vaping Use  . Vaping Use: Never used  Substance and  Sexual Activity  . Alcohol use: No    Alcohol/week: 0.0 standard drinks  . Drug use: No  . Sexual activity: Not on file  Other Topics Concern  . Not on file  Social History Narrative  . Not on file   Social Determinants of Health   Financial Resource Strain:   . Difficulty of Paying Living Expenses: Not on file  Food Insecurity:   . Worried About Charity fundraiser in the Last Year: Not on file  . Ran Out of Food in the Last Year: Not on file  Transportation Needs:   . Lack of Transportation (Medical): Not on file  . Lack of Transportation (Non-Medical): Not on file  Physical Activity:   . Days of Exercise per Week: Not on file  . Minutes of Exercise per Session: Not on file  Stress:   . Feeling of Stress : Not on file  Social Connections:   . Frequency of Communication with  Friends and Family: Not on file  . Frequency of Social Gatherings with Friends and Family: Not on file  . Attends Religious Services: Not on file  . Active Member of Clubs or Organizations: Not on file  . Attends Archivist Meetings: Not on file  . Marital Status: Not on file  Intimate Partner Violence:   . Fear of Current or Ex-Partner: Not on file  . Emotionally Abused: Not on file  . Physically Abused: Not on file  . Sexually Abused: Not on file    Allergies  Allergen Reactions  . Hctz [Hydrochlorothiazide] Other (See Comments)    Has had hyponatremia    Current Facility-Administered Medications  Medication Dose Route Frequency Provider Last Rate Last Admin  .  stroke: mapping our Milanie Rosenfield stages of recovery book   Does not apply Once Lenore Cordia, MD      . acetaminophen (TYLENOL) tablet 650 mg  650 mg Oral Q4H PRN Lenore Cordia, MD       Or  . acetaminophen (TYLENOL) 160 MG/5ML solution 650 mg  650 mg Per Tube Q4H PRN Lenore Cordia, MD       Or  . acetaminophen (TYLENOL) suppository 650 mg  650 mg Rectal Q4H PRN Zada Finders R, MD      . albuterol (PROVENTIL) (2.5 MG/3ML) 0.083% nebulizer solution 2.5 mg  2.5 mg Nebulization Q4H PRN Zada Finders R, MD      . aspirin chewable tablet 81 mg  81 mg Oral Daily Lenore Cordia, MD   81 mg at 10/21/20 0914  . atorvastatin (LIPITOR) tablet 40 mg  40 mg Oral Daily Rosalin Hawking, MD   40 mg at 10/21/20 0914  . heparin injection 5,000 Units  5,000 Units Subcutaneous Q8H Lenore Cordia, MD   5,000 Units at 10/21/20 1516  . ipratropium-albuterol (DUONEB) 0.5-2.5 (3) MG/3ML nebulizer solution 3 mL  3 mL Nebulization Q6H PRN Zada Finders R, MD      . labetalol (NORMODYNE) injection 10 mg  10 mg Intravenous Q4H PRN Patel, Vishal R, MD      . mometasone-formoterol (DULERA) 100-5 MCG/ACT inhaler 2 puff  2 puff Inhalation Q12H Patel, Vishal R, MD      . senna-docusate (Senokot-S) tablet 1 tablet  1 tablet Oral QHS PRN Lenore Cordia, MD        REVIEW OF SYSTEMS:  Reviewed and as history and physical with nothing to add  PHYSICAL EXAM: Vitals:   10/21/20 0930 10/21/20 1000  10/21/20 1029 10/21/20 1604  BP: (!) 176/67 (!) 180/65 (!) 184/70 (!) 152/48  Pulse: 81 80 83 80  Resp: 15 (!) 23 20 18   Temp:   98.1 F (36.7 C) 98.7 F (37.1 C)  TempSrc:   Oral Oral  SpO2: 98% 96% 99% 99%  Weight:   70.1 kg   Height:   5\' 6"  (1.676 m)     GENERAL: The patient is a well-nourished male, in no acute distress. The vital signs are documented above. PULMONARY: There is good air exchange  MUSCULOSKELETAL: There are no major deformities or cyanosis. NEUROLOGIC: No focal weakness or paresthesias are detected. SKIN: There are no ulcers or rashes noted. PSYCHIATRIC: The patient has a normal affect.  DATA:   CT angiogram reveals 90% left carotid bifurcation stenosis and 70% right carotid bifurcation stenosis.  Both of these are very calcified with some irregularity.  MRI of the brain shows scattered small acute cortical infarcts in the posterior left MCA distribution.  MEDICAL ISSUES:  Symptomatic left internal carotid artery stenosis.  I discussed this at great length with the patient.  I have recommended left carotid endarterectomy for reduction of stroke risk due to his symptomatic disease.  I explained that we typically do not treat this as a emergent event if there is not crescendo TIAs which she has not had.  There is concern regarding his hematologic issues.  Dr.Dorsey is speaking with the patient now to help guide plans for urgent surgery in the next several weeks.  We will follow with you   Rosetta Posner, MD [facility] phone [phone]

## 2020-10-22 ENCOUNTER — Ambulatory Visit: Payer: Medicare Other | Admitting: Cardiology

## 2020-10-22 DIAGNOSIS — N1832 Chronic kidney disease, stage 3b: Secondary | ICD-10-CM

## 2020-10-22 DIAGNOSIS — J9601 Acute respiratory failure with hypoxia: Secondary | ICD-10-CM

## 2020-10-22 DIAGNOSIS — J449 Chronic obstructive pulmonary disease, unspecified: Secondary | ICD-10-CM

## 2020-10-22 DIAGNOSIS — D75839 Thrombocytosis, unspecified: Secondary | ICD-10-CM

## 2020-10-22 DIAGNOSIS — D649 Anemia, unspecified: Secondary | ICD-10-CM | POA: Diagnosis not present

## 2020-10-22 DIAGNOSIS — D72829 Elevated white blood cell count, unspecified: Secondary | ICD-10-CM

## 2020-10-22 DIAGNOSIS — R531 Weakness: Secondary | ICD-10-CM | POA: Diagnosis not present

## 2020-10-22 DIAGNOSIS — I63132 Cerebral infarction due to embolism of left carotid artery: Secondary | ICD-10-CM | POA: Diagnosis not present

## 2020-10-22 DIAGNOSIS — I10 Essential (primary) hypertension: Secondary | ICD-10-CM

## 2020-10-22 DIAGNOSIS — D469 Myelodysplastic syndrome, unspecified: Secondary | ICD-10-CM

## 2020-10-22 LAB — CBC WITH DIFFERENTIAL/PLATELET
Abs Immature Granulocytes: 0 10*3/uL (ref 0.00–0.07)
Basophils Absolute: 0 10*3/uL (ref 0.0–0.1)
Basophils Relative: 0 %
Eosinophils Absolute: 0.6 10*3/uL — ABNORMAL HIGH (ref 0.0–0.5)
Eosinophils Relative: 1 %
HCT: 22.5 % — ABNORMAL LOW (ref 39.0–52.0)
Hemoglobin: 7.2 g/dL — ABNORMAL LOW (ref 13.0–17.0)
Lymphocytes Relative: 7 %
Lymphs Abs: 4.5 10*3/uL — ABNORMAL HIGH (ref 0.7–4.0)
MCH: 29 pg (ref 26.0–34.0)
MCHC: 32 g/dL (ref 30.0–36.0)
MCV: 90.7 fL (ref 80.0–100.0)
Monocytes Absolute: 1.9 10*3/uL — ABNORMAL HIGH (ref 0.1–1.0)
Monocytes Relative: 3 %
Neutro Abs: 57.7 10*3/uL — ABNORMAL HIGH (ref 1.7–7.7)
Neutrophils Relative %: 89 %
Platelets: 1793 10*3/uL (ref 150–400)
RBC: 2.48 MIL/uL — ABNORMAL LOW (ref 4.22–5.81)
RDW: 20.3 % — ABNORMAL HIGH (ref 11.5–15.5)
WBC: 64.8 10*3/uL (ref 4.0–10.5)
nRBC: 2.4 % — ABNORMAL HIGH (ref 0.0–0.2)
nRBC: 4 /100 WBC — ABNORMAL HIGH

## 2020-10-22 LAB — COMPREHENSIVE METABOLIC PANEL
ALT: 21 U/L (ref 0–44)
AST: 30 U/L (ref 15–41)
Albumin: 2.8 g/dL — ABNORMAL LOW (ref 3.5–5.0)
Alkaline Phosphatase: 53 U/L (ref 38–126)
Anion gap: 8 (ref 5–15)
BUN: 24 mg/dL — ABNORMAL HIGH (ref 8–23)
CO2: 23 mmol/L (ref 22–32)
Calcium: 8.6 mg/dL — ABNORMAL LOW (ref 8.9–10.3)
Chloride: 105 mmol/L (ref 98–111)
Creatinine, Ser: 1.59 mg/dL — ABNORMAL HIGH (ref 0.61–1.24)
GFR, Estimated: 40 mL/min — ABNORMAL LOW (ref >60)
Glucose, Bld: 103 mg/dL — ABNORMAL HIGH (ref 70–99)
Potassium: 4.1 mmol/L (ref 3.5–5.1)
Sodium: 136 mmol/L (ref 135–145)
Total Bilirubin: 0.5 mg/dL (ref 0.3–1.2)
Total Protein: 5.4 g/dL — ABNORMAL LOW (ref 6.5–8.1)

## 2020-10-22 LAB — PATHOLOGIST SMEAR REVIEW

## 2020-10-22 MED ORDER — ASPIRIN 81 MG PO TABS: 81.0000 mg | ORAL_TABLET | Freq: Every day | ORAL | Status: AC

## 2020-10-22 MED ORDER — ATORVASTATIN CALCIUM 40 MG PO TABS: 40.0000 mg | ORAL_TABLET | Freq: Every day | ORAL | 2 refills | Status: AC

## 2020-10-22 MED ORDER — ALBUTEROL SULFATE (2.5 MG/3ML) 0.083% IN NEBU: 2.5000 mg | INHALATION_SOLUTION | RESPIRATORY_TRACT | Status: DC | PRN

## 2020-10-22 MED ORDER — HYDROXYUREA 500 MG PO CAPS: 500.0000 mg | ORAL_CAPSULE | Freq: Every day | ORAL | 2 refills | Status: AC

## 2020-10-22 NOTE — Progress Notes (Addendum)
STROKE TEAM PROGRESS NOTE    Interval History   NAEON, stroke work up complete at this time.   Pertinent Lab Work and Imaging    CT Head WO IV Contrast 10/20/20 1. Chronic ischemic microangiopathy without acute intracranial abnormality. 2. ASPECTS is 10. CT Angio Head and Neck W WO IV Contrast 10/20/20 1. No intracranial arterial occlusion or high-grade stenosis of the intracranial arteries. 2. Bilateral proximal internal carotid artery stenoses, measuring 70 % on the right and 90 % on the left. 3. Small pleural effusions and multiple upper mediastinal lymph Nodes.  MRI Brain WO IV Contrast 10/21/20 1. Scattered small acute cortical infarcts in the posterior left MCA territory. 2. Age congruent senescent changes. 3. Lower than expected marrow signal, please correlate with CBC.  Echocardiogram Complete 10/21/20 Left Ventricle: Left ventricular ejection fraction, by estimation, is 55  to 60%. The left ventricle has normal function. The left ventricle has no  regional wall motion abnormalities. The left ventricular internal cavity  size was normal in size. There is  no left ventricular hypertrophy. Left ventricular diastolic parameters  are consistent with Grade II diastolic dysfunction (pseudonormalization).   Right Ventricle: The right ventricular size is normal. No increase in  right ventricular wall thickness. Right ventricular systolic function is  normal. There is moderately elevated pulmonary artery systolic pressure.  The tricuspid regurgitant velocity is  3.09 m/s, and with an assumed right atrial pressure of 8 mmHg, the  estimated right ventricular systolic pressure is 93.8 mmHg.   Left Atrium: Left atrial size was severely dilated.   Right Atrium: Right atrial size was mildly dilated.   Pericardium: There is no evidence of pericardial effusion.   Mitral Valve: The mitral valve is normal in structure. Moderate mitral  annular calcification. Mild to moderate  mitral valve regurgitation. Mild  mitral valve stenosis. The mean mitral valve gradient is 5.5 mmHg with  average heart rate of 83 bpm.   Tricuspid Valve: The tricuspid valve is normal in structure. Tricuspid  valve regurgitation is trivial. No evidence of tricuspid stenosis.   Aortic Valve: The aortic valve is abnormal. There is moderate  calcification of the aortic valve. Aortic valve regurgitation is trivial.  No aortic stenosis is present.   Pulmonic Valve: The pulmonic valve was not well visualized. Pulmonic valve  regurgitation is trivial. No evidence of pulmonic stenosis.   Aorta: The aortic root is normal in size and structure.   Venous: The inferior vena cava is normal in size with less than 50%  respiratory variability, suggesting right atrial pressure of 8 mmHg.   IAS/Shunts: Cannot exclude atrial level shunt by color flow Doppler,  consider agitated saline exam.   Physical Examination   Constitutional: Calm, appropriate for condition  Cardiovascular: Normal RR Respiratory: No increased WOB   Mental status: AAOx4, following commands  Speech: Fluent with repetition and naming intact. No dysarthria  Cranial nerves: EOMI, VFF, Face symmetric, Shoulder shrug intact, Tongue midline  Motor: Normal bulk and tone. No drift. Full strength throughout.  Sensory: Intact to light tough throughout  Coordination: FNF intact Reflexes: Not assessed Gait: Deferred   NIHSS: 0   Assessment and Plan  Patrick Rodriguez is a 86 year old male w/pmh of COPD, basal cell cancer, bilateral carotid stenosis, coronary artery disease, hypertension, history of MI, history of syncope, leukocytosis and thrombocytosis-myelodysplastic syndrome, presented to the emergency room from EMS as a code stroke for sudden onset of confusion, left-sided weakness and aphasia. NIHSS in the ED was  2, too low to receive IVTPA. Thrombectomy was not performed due to lack of LVO.   #Patchy L MCA stroke in the setting of LAA   Patient presented with the symptoms described above. At this time, his stroke work up is complete. CTA Head and Neck was pertinent for bilateral carotid stenosis up to 90 % on the left and 70 % on the right. Echocardiogram w/EF 55 to 60 %, left atrium severely dilated, normal right ventricular size and no intracardiac source of stroke. LDL 115 A1C 6.1 MRI Brain showed patchy acute cortical stroke in the posterior L MCA territory. He was started on ASA 81 mg and Atorvastatin 40 mg for secondary stroke prevention. It was recommended to consult vascular surgery given his L MCA stroke is likely in the setting of carotid stenosis. Vascular was able to see the patient and have recommended left carotid endarterectomy for reduction of stroke risk due to symptomatic disease. Given the patients myelodysplastic syndrome the timing of surgery will be coordinated on an outpatient basis with oncology input. From a stroke standpoint he is ok to discharge and follow up with vascular + oncology + stroke.  -Continue ASA 81 mg for secondary stroke prevention -Continue Atorvastatin 40 mg for secondary stroke prevention  -At discharge please place ambulatory referral to neurology for stroke follow up   #Hypertension He has a history of HTN and takes Amlodipine 10 mg QD at home. His blood pressure goal is 130-150. Home amlodipine was resumed this admission.   #Hyperlipidemia From a stroke prevention stand point, the LDL goal is < 70. His LDL is 115. Atorvastatin 40 mg started this admission for secondary stroke prevention.   #Prediabetes  Hemoglobin A1C this admission noted to be 6.1. He should follow up with his outpatient PCP for prediabetes management. From a stroke stand point A1C goal is < 7 and he is at that goal.   Neurology will sign off. Please contact with any questions.   Hospital day # 1  Patrick Hinds, NP  [facility] Nurse Practitioner Patient seen and discussed with attending physician Dr.  [name]   ATTENDING NOTE: I reviewed above note and agree with the assessment and plan.   No acute event overnight. Neuro stable. Patient was seen by vascular surgery and oncology. Plan to have CEA after leukocytosis and thrombocytosis improved. Discussed with oncology, okay to continue aspirin monotherapy at this time. Continue statin. Okay to be discharged from neuro standpoint.  Neurology will sign off. Please call with questions. Pt will follow up with stroke clinic NP at [facility] in about 4 weeks. Thanks for the consult.   Patrick Hawking, MD PhD Stroke Neurology 10/22/2020 2:09 PM     To contact Stroke Continuity provider, please refer to [email or web address] After hours, contact General Neurology

## 2020-10-22 NOTE — Plan of Care (Signed)

## 2020-10-22 NOTE — Progress Notes (Signed)
SLP Cancellation Note  Patient Details Name: Patrick Rodriguez. MRN: [id number] DOB: [birth year]   Cancelled treatment:       Reason Eval/Treat Not Completed: SLP screened, no needs identified, will sign off. Pt was on a Zoom meeting upon SLP's entry. 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 Pt reported that his language skills are back to baseline. No recent language deficits have been noted by nursing or other rehab disciplines. A formal speech/language evaluation does not appear to be clinically indicated at this time.   Myishia Kasik I. Hardin Negus, [place], [place] Office number [phone] Pager [place] 10/22/2020, 10:34 AM

## 2020-10-22 NOTE — TOC Transition Note (Signed)
Transition of Care [facility]) - CM/SW Discharge Note   Patient Details  Name: Royale Swamy. MRN: [id number] Date of Birth: February 11, 1925  [facility] CM/SW Contact:  Pollie Friar, RN Phone Number: 10/22/2020, 11:22 AM   Clinical Narrative:    CM met with the patient to discuss [facility] therapy. Pt feels he is at his baseline and doesn't need HH. CM has updated the bedside RN. Pt doesn't have any home DME and no current needs.  Pt denies issues with home medications or transportation. Spouse can provide needed supervision and transportation home.   Final next level of care: Home/Self Care Barriers to Discharge: No Barriers Identified   Patient Goals and CMS Choice   CMS Medicare.gov Compare Post Acute Care list provided to:: Patient Choice offered to / list presented to : Patient  Discharge Placement                       Discharge Plan and Services                                     Social Determinants of Health (SDOH) Interventions     Readmission Risk Interventions No flowsheet data found.

## 2020-10-22 NOTE — Progress Notes (Signed)
  Progress Note    10/22/2020 9:57 AM * No surgery found *  Subjective:  Denies any further stroke like symptoms including slurring speech, changes in vision, or one sided weakness   Vitals:   10/22/20 0338 10/22/20 0831  BP: (!) 189/71 (!) 168/64  Pulse: 73 85  Resp: 19 16  Temp: 98.1 F (36.7 C) 97.9 F (36.6 C)  SpO2: 97% 98%   Physical Exam: Lungs:  Non labored Extremities:  Moving all extremities well Abdomen:  Soft, NT, ND Neurologic: A&O; CN grossly intact  CBC    Component Value Date/Time   WBC 64.8 (HH) 10/22/2020 0350   RBC 2.48 (L) 10/22/2020 0350   HGB 7.2 (L) 10/22/2020 0350   HGB 8.2 (L) 09/25/2020 0836   HCT 22.5 (L) 10/22/2020 0350   PLT 1,793 (HH) 10/22/2020 0350   PLT 893 (H) 09/25/2020 0836   MCV 90.7 10/22/2020 0350   MCV 88.9 12/09/2015 1152   MCH 29.0 10/22/2020 0350   MCHC 32.0 10/22/2020 0350   RDW 20.3 (H) 10/22/2020 0350   LYMPHSABS 4.5 (H) 10/22/2020 0350   MONOABS 1.9 (H) 10/22/2020 0350   EOSABS 0.6 (H) 10/22/2020 0350   BASOSABS 0.0 10/22/2020 0350    BMET    Component Value Date/Time   NA 136 10/22/2020 0350   K 4.1 10/22/2020 0350   CL 105 10/22/2020 0350   CO2 23 10/22/2020 0350   GLUCOSE 103 (H) 10/22/2020 0350   BUN 24 (H) 10/22/2020 0350   CREATININE 1.59 (H) 10/22/2020 0350   CREATININE 1.56 (H) 06/30/2020 1029   CREATININE 1.15 (H) 12/09/2015 1144   CALCIUM 8.6 (L) 10/22/2020 0350   GFRNONAA 40 (L) 10/22/2020 0350   GFRNONAA 38 (L) 06/30/2020 1029   GFRNONAA 60 06/19/2015 1119   GFRAA 44 (L) 06/30/2020 1029   GFRAA 70 06/19/2015 1119    INR    Component Value Date/Time   INR 1.2 10/20/2020 2202     Intake/Output Summary (Last 24 hours) at 10/22/2020 0957 Last data filed at 10/22/2020 2707 Gross per 24 hour  Intake 440 ml  Output 825 ml  Net -385 ml     Assessment/Plan:  86 y.o. male with symptomatic left carotid stenosis   No further stroke like symptoms overnight; patient believes he is at  baseline Dr. Donnetta Hutching discussed L carotid endarterectomy with patient Patient is agreeable to surgery as long as leukocytosis/thrombocytosis improves which is being addressed by Dr. Tally Joe from vascular standpoint for discharge and return as outpatient for surgery pending follow up with Dr. Hermenia Fiscal, PA-C [facility] [phone] 10/22/2020 9:57 AM

## 2020-10-22 NOTE — Progress Notes (Signed)
Physical Therapy Treatment Patient Details Name: Patrick Rodriguez. MRN: [id number] DOB: Jun 13, 1925 Today's Date: 10/22/2020    History of Present Illness Pt is a 86 y/o male admitted secondary to expressive aphasia and L arm weakness. Symptoms have since improved. Imaging revealed L MCA infarcts. PMH includes MDS, HTN, COPD, CAD, and CKD.     PT Comments    Focused session on assessing SpO2 levels with activity and assessing pt's balance with standing tasks and gait. Pt's SpO2 levels would decrease to 90% on occasion during mobility, thus educated pt on proper breathing in through nose and out through pursed lips which would improve his levels to 93-95% while performing a standing rest break. He was able to ambulate increased distances this date. He initially displayed some unsteadiness but this improved as distance progressed. He was also able to reach off BOS, across midline, and maintain standing balance in [place], semi-tandem, and tandem position without LOB. Thus, recommending HH PT to address his deficits in muscular and aerobic endurance but no need for equipment upon d/c. Will continue to follow acutely.    Follow Up Recommendations  Home health PT     Equipment Recommendations  None recommended by PT    Recommendations for Other Services       Precautions / Restrictions Precautions Precautions: Fall;Other (comment) Precaution Comments: O2 via [place] Restrictions Weight Bearing Restrictions: No    Mobility  Bed Mobility               General bed mobility comments: Pt sitting up EOB upon arrival.  Transfers Overall transfer level: Needs assistance Equipment used: None Transfers: Sit to/from Stand Sit to Stand: Supervision         General transfer comment: supervision for safety, but pt able to come to stand in appropriate time frame without LOB.  Ambulation/Gait Ambulation/Gait assistance: Min guard Gait Distance (Feet): 70 Feet Assistive device: None Gait  Pattern/deviations: Step-through pattern;Narrow base of support;Decreased stride length Gait velocity: WFL Gait velocity interpretation: 1.31 - 2.62 ft/sec, indicative of limited community ambulator General Gait Details: Initially, pt displayed some unsteadiness with his feet crossing one another on occasion, but pt able to regain balance without physical intervention from therapist. Balance improved as distance progressed.   Stairs             Wheelchair Mobility    Modified Rankin (Stroke Patients Only) Modified Rankin (Stroke Patients Only) Pre-Morbid Rankin Score: No symptoms Modified Rankin: Moderately severe disability     Balance Overall balance assessment: No apparent balance deficits (not formally assessed) Sitting-balance support: No upper extremity supported;Feet supported Sitting balance-Leahy Scale: Good     Standing balance support: No upper extremity supported Standing balance-Leahy Scale: Good Standing balance comment: Pt able to reach across midline and off BOS without UE support and no LOB. Able to maintain static standing balance in [place], semi-tandem, and tandem position without UE support or LOB with eyes open. However, trunk sway increased with tandem stance.                            Cognition Arousal/Alertness: Awake/alert Behavior During Therapy: WFL for tasks assessed/performed Overall Cognitive Status: Within Functional Limits for tasks assessed                                 General Comments: WFL for simple tasks. Did not assess formally  Exercises      General Comments General comments (skin integrity, edema, etc.): Pt on 3L/min O2 via [place] upon arrival, trialed reducing levels as he does not use supplemental O2 at baseline. 2L/min O2 via [place] during gait with SpO2 remaining >/= 90%, once seated to rest SpO2 >/= 95% and left pt on 2L/min O2, nurse notified      Pertinent Vitals/Pain Pain Assessment:  No/denies pain    Home Living                      Prior Function            PT Goals (current goals can now be found in the care plan section) Acute Rehab PT Goals Patient Stated Goal: to go home PT Goal Formulation: With patient Time For Goal Achievement: 11/04/20 Potential to Achieve Goals: Good Progress towards PT goals: Progressing toward goals    Frequency    Min 4X/week      PT Plan Current plan remains appropriate    Co-evaluation              AM-PAC PT "6 Clicks" Mobility   Outcome Measure  Help needed turning from your back to your side while in a flat bed without using bedrails?: None Help needed moving from lying on your back to sitting on the side of a flat bed without using bedrails?: A Little Help needed moving to and from a bed to a chair (including a wheelchair)?: A Little Help needed standing up from a chair using your arms (e.g., wheelchair or bedside chair)?: A Little Help needed to walk in hospital room?: A Little Help needed climbing 3-5 steps with a railing? : A Little 6 Click Score: 19    End of Session Equipment Utilized During Treatment: Gait belt Activity Tolerance: Patient tolerated treatment well Patient left: in chair;with call bell/phone within reach;with chair alarm set Nurse Communication: Mobility status;Other (comment) (O2 levels) PT Visit Diagnosis: Unsteadiness on feet (R26.81);Muscle weakness (generalized) (M62.81);Other abnormalities of gait and mobility (R26.89)     Time: 1155-2080 PT Time Calculation (min) (ACUTE ONLY): 19 min  Charges:  $Gait Training: 8-22 mins                     Moishe Spice, PT, DPT Acute Rehabilitation Services  Pager: [phone] Office: [place] 10/22/2020, 2:14 PM

## 2020-10-22 NOTE — Discharge Summary (Addendum)
Physician Discharge Summary  [name] VXB:[id number] DOB: [birth year] DOA: 10/20/2020  PCP: Maury Dus, MD  Admit date: 10/20/2020 Discharge date: 10/22/2020  Admitted From: Home  Discharge disposition: Home   Recommendations for Outpatient Follow-Up:   Follow up with your primary care provider in one week.  Check CBC, BMP, magnesium in the next visit Follow-up with [facility] in 3 to 4 weeks. Follow-up with him at oncology Dr. Lorenso Courier as outpatient for treatment of hematological issues and thrombocythemia in 1 to 2 weeks. Follow-up with vascular surgery Dr. Donnetta Hutching after treatment of her hematological issues with Dr. Lorenso Courier to discuss regarding carotid surgery. BP goal of 130-1 50 secondary to bilateral carotid artery stenosis.  Discharge Diagnosis:   Principal Problem:   Left-sided weakness Active Problems:   Essential hypertension   COPD GOLD I   CAD (coronary artery disease)   Anemia   Myelodysplastic syndrome (HCC)   Leukocytosis   Thrombocytosis   Acute respiratory failure with hypoxia (HCC)   CKD (chronic kidney disease) stage 3, GFR 30-59 ml/min (HCC)   Cerebral embolism with cerebral infarction   Cerebrovascular accident (CVA) due to thrombosis of right vertebral artery ([place])   Discharge Condition: Improved.  Diet recommendation: Low sodium, heart healthy.    Wound care: None.  Code status: DNR   History of Present Illness:   Patrick Rodriguez. is a 86 y.o. male with medical history significant for MDS/MPS (with associated leukocytosis, thrombocytosis, and anemia), CAD, carotid artery stenosis,, CKD stage III, COPD, hypertension, and hard of hearing presented to the ED with complaints of expressive aphasia.  Per wife, patient was last known well until 2108 on 10/20/2020 when he had new onset of speech difficulty which was incomprehensible. initial blood pressure was 330 systolic which increased to 076 systolic.  EMS noticed some  mild left facial weakness as well as mild left arm weakness.On arrival to the ED, also complained of shortness of breath and dyspnea on exertion.  He does have history of COPD and is on maintenance inhalers.  Patient has been receiving intermittent PRBC transfusion for symptomatic anemia by history but oncologist.  Last transfusion was 3 weeks prior to presentation.    In the ED, laboratory data was significant for WBC of 81.0, absolute neutrophils 51,400, platelets 2,016,000, hemoglobin 7.5, BUN 23, creatinine 1.53, serum glucose 172, BNP 439.1. Urinalysis was negative for UTI.  UDS was negative.  Serum ethanol undetectable. Patient was brought in as a code stroke and seen by neurology on arrival.  CT head without contrast showed chronic ischemic microangiopathy without acute intracranial abnormality. CTA head/neck was negative for intracranial arterial occlusion or high-grade stenosis of the intracranial arteries.  Bilateral proximal ICA stenosis measuring 70% on the right and 90% on the left is noted.  Small pleural effusions seen. Portable chest x-ray shows enlarged cardiac silhouette with bilateral interstitial and airspace opacities. Patient was initially placed on nonrebreather and has since been transitioned to 2-3 L supplemental O2 via [place]. MRI brain was completed and demonstrated scattered small acute cortical infarcts in the posterior left MCA territory, age congruent senescent changes, and lower than expected marrow signal which correlates with the patient's history of myelodysplastic syndrome. Vascular surgery was consulted for the patient's bilateral ICA stenosis. 70% stenosis on right and 90% stenosis on the left.   Hospital Course:   Following conditions were addressed during hospitalization as listed below,  Acute CVA with scattered small acute infarcts in the posterior left MCA  territory as seen on MRI:  Patient did have left-sided weakness and expressive aphasia with improvement during  hospitalization.  CT of the head and neck without large vessel occlusion.  Patient did have 90% stenosis of ICA on the left and 70% on the right side.  2D echocardiogram showed preserved LV function with LV ejection fraction of 55 to 60%.  Patient was seen by vascular surgery who recommended carotid surgery as outpatient after optimizing of his hematological issues.  He was seen by physical therapy who recommended home PT on discharge.  Patient has been started on Lipitor 40 mg daily during this hospitalization and will be continued on discharge.  Acute respiratory failure with hypoxia:  History of COPD and grade II diastolic dysfunction with moderate pulmonary artery hypertension.  Patient received IV Lasix and was continued on Dulera and inhalers during hospitalization with improvement   Chronic heart failure with preserved ejection fraction.  .  Improved with 1 dose of IV Lasix.    Myelodysplastic syndrome/myeloproliferative syndrome with stroke. Patient had severe thrombocytosis and leukocytosis, with anemia putting him at the risk for stroke.  Patient was seen by hematology Dr Lorenso Courier during this hospitalization and was initiated on Hydrea.  Patient receives intermittent PRBC transfusion as outpatient.  Communicated with the hematooncology vascular surgery and neurology.  At this time plan is to continue aspirin alone and no dual antiplatelets.  Bilateral carotid artery stenosis: CTA head and neck shows b/l proximal ICA stenosis seen, 70% on the right and 90% on the left.  This has worsened compared to carotid duplex ultrasound 2 years back.  Vascular surgery on board and patient will be followed by vascular surgery as outpatient.     COPD: Continue Dulera, inhalers on discharge.   CKD stage IIIb: Chronic.  Will need outpatient monitoring.   Essential hypertension: Continue amlodipine on discharge.  BP goal of 130-1 50 secondary to bilateral carotid artery stenosis.  Disposition.  At this time,  patient is stable for disposition with outpatient PCP, neurology, vascular surgery follow-up.  Medical Consultants:   Neurology Vascular surgery Oncology  Procedures:    CT head scan, CT angiogram of the head and neck, MRI of the brain, 2D echocardiogram Subjective:   Today, patient seen and examined at bedside.  Has ambulated.  Denies expressive aphasia and weakness has resolved.  Patient wishes to go home.  Denies any dizziness, lightheadedness shortness of breath cough fever.  Has declined home health.  Discharge Exam:   Vitals:   10/22/20 0338 10/22/20 0831  BP: (!) 189/71 (!) 168/64  Pulse: 73 85  Resp: 19 16  Temp: 98.1 F (36.7 C) 97.9 F (36.6 C)  SpO2: 97% 98%   Vitals:   10/21/20 2322 10/22/20 0338 10/22/20 0500 10/22/20 0831  BP: (!) 181/65 (!) 189/71  (!) 168/64  Pulse: 82 73  85  Resp: 18 19  16   Temp: 98.2 F (36.8 C) 98.1 F (36.7 C)  97.9 F (36.6 C)  TempSrc: Oral Oral  Oral  SpO2: 98% 97%  98%  Weight:   66 kg   Height:       General: Alert awake, not in obvious distress, mildly hard of hearing HENT: pupils equally reacting to light, mild pallor noted, oral mucosa is moist.  Chest:  Clear breath sounds.  Diminished breath sounds bilaterally. No crackles or wheezes.  CVS: S1 &S2 heard. No murmur.  Regular rate and rhythm. Abdomen: Soft, nontender, nondistended.  Bowel sounds are heard.   Extremities:  No cyanosis, clubbing or edema.  Peripheral pulses are palpable. Psych: Alert, awake and oriented, normal mood CNS:  No cranial nerve deficits.  Power equal in all extremities.   Skin: Warm and dry.  No rashes noted.  Skin with senile purpura and mild bruises.  The results of significant diagnostics from this hospitalization (including imaging, microbiology, ancillary and laboratory) are listed below for reference.     Diagnostic Studies:   CT Code Stroke CTA Head W/WO contrast  Result Date: 10/20/2020 CLINICAL DATA:  Left-sided weakness and  aphasia EXAM: CT ANGIOGRAPHY HEAD AND NECK CT PERFUSION BRAIN TECHNIQUE: Multidetector CT imaging of the head and neck was performed using the standard protocol during bolus administration of intravenous contrast. Multiplanar CT image reconstructions and MIPs were obtained to evaluate the vascular anatomy. Carotid stenosis measurements (when applicable) are obtained utilizing NASCET criteria, using the distal internal carotid diameter as the denominator. Multiphase CT imaging of the brain was performed following IV bolus contrast injection. Subsequent parametric perfusion maps were calculated using RAPID software. CONTRAST:  128mL OMNIPAQUE IOHEXOL 350 MG/ML SOLN COMPARISON:  None. FINDINGS: CTA NECK FINDINGS SKELETON: There is no bony spinal canal stenosis. No lytic or blastic lesion. OTHER NECK: Normal pharynx, larynx and major salivary glands. No cervical lymphadenopathy. Unremarkable thyroid gland. UPPER CHEST: Small pleural effusions and multiple upper mediastinal lymph nodes. AORTIC ARCH: There is calcific atherosclerosis of the aortic arch. There is no aneurysm, dissection or hemodynamically significant stenosis of the visualized portion of the aorta. Conventional 3 vessel aortic branching pattern. The visualized proximal subclavian arteries are widely patent. RIGHT CAROTID SYSTEM: No dissection, occlusion or aneurysm. There is mixed density atherosclerosis extending into the proximal ICA, resulting in 70% stenosis. LEFT CAROTID SYSTEM: No dissection, occlusion or aneurysm. There is predominantly calcified atherosclerosis extending into the proximal ICA, resulting in approximately 90 % stenosis. VERTEBRAL ARTERIES: Left dominant configuration. Both origins are clearly patent. There is no dissection, occlusion or flow-limiting stenosis to the skull base (V1-V3 segments). CTA HEAD FINDINGS POSTERIOR CIRCULATION: --Vertebral arteries: Normal V4 segments. --Inferior cerebellar arteries: Normal. --Basilar artery:  Normal. --Superior cerebellar arteries: Normal. --Posterior cerebral arteries (PCA): Normal. The right PCA is predominantly supplied by the posterior communicating artery. Left P-comm is also present. ANTERIOR CIRCULATION: --Intracranial internal carotid arteries: Normal. --Anterior cerebral arteries (ACA): Normal. Both A1 segments are present. Patent anterior communicating artery (a-comm). --Middle cerebral arteries (MCA): Normal. VENOUS SINUSES: As permitted by contrast timing, patent. ANATOMIC VARIANTS: None Review of the MIP images confirms the above findings. CT Brain Perfusion Findings: Contrast bolus was deemed insufficient by the RAPID software. Manual post processing of the data will be attempted and, if successful, an addendum will be issued. IMPRESSION: 1. No intracranial arterial occlusion or high-grade stenosis of the intracranial arteries. 2. Bilateral proximal internal carotid artery stenoses, measuring 70 % on the right and 90 % on the left. 3. Small pleural effusions and multiple upper mediastinal lymph nodes. 4. Contrast bolus was deemed insufficient by the RAPID software. Manual post processing of the data will be attempted and, if successful, an addendum will be issued. Aortic Atherosclerosis (ICD10-I70.0). Electronically Signed   By: Ulyses Jarred M.D.   On: 10/20/2020 23:20   CT Code Stroke CTA Neck W/WO contrast  Result Date: 10/20/2020 CLINICAL DATA:  Left-sided weakness and aphasia EXAM: CT ANGIOGRAPHY HEAD AND NECK CT PERFUSION BRAIN TECHNIQUE: Multidetector CT imaging of the head and neck was performed using the standard protocol during bolus administration of intravenous contrast. Multiplanar CT  image reconstructions and MIPs were obtained to evaluate the vascular anatomy. Carotid stenosis measurements (when applicable) are obtained utilizing NASCET criteria, using the distal internal carotid diameter as the denominator. Multiphase CT imaging of the brain was performed following IV  bolus contrast injection. Subsequent parametric perfusion maps were calculated using RAPID software. CONTRAST:  172mL OMNIPAQUE IOHEXOL 350 MG/ML SOLN COMPARISON:  None. FINDINGS: CTA NECK FINDINGS SKELETON: There is no bony spinal canal stenosis. No lytic or blastic lesion. OTHER NECK: Normal pharynx, larynx and major salivary glands. No cervical lymphadenopathy. Unremarkable thyroid gland. UPPER CHEST: Small pleural effusions and multiple upper mediastinal lymph nodes. AORTIC ARCH: There is calcific atherosclerosis of the aortic arch. There is no aneurysm, dissection or hemodynamically significant stenosis of the visualized portion of the aorta. Conventional 3 vessel aortic branching pattern. The visualized proximal subclavian arteries are widely patent. RIGHT CAROTID SYSTEM: No dissection, occlusion or aneurysm. There is mixed density atherosclerosis extending into the proximal ICA, resulting in 70% stenosis. LEFT CAROTID SYSTEM: No dissection, occlusion or aneurysm. There is predominantly calcified atherosclerosis extending into the proximal ICA, resulting in approximately 90 % stenosis. VERTEBRAL ARTERIES: Left dominant configuration. Both origins are clearly patent. There is no dissection, occlusion or flow-limiting stenosis to the skull base (V1-V3 segments). CTA HEAD FINDINGS POSTERIOR CIRCULATION: --Vertebral arteries: Normal V4 segments. --Inferior cerebellar arteries: Normal. --Basilar artery: Normal. --Superior cerebellar arteries: Normal. --Posterior cerebral arteries (PCA): Normal. The right PCA is predominantly supplied by the posterior communicating artery. Left P-comm is also present. ANTERIOR CIRCULATION: --Intracranial internal carotid arteries: Normal. --Anterior cerebral arteries (ACA): Normal. Both A1 segments are present. Patent anterior communicating artery (a-comm). --Middle cerebral arteries (MCA): Normal. VENOUS SINUSES: As permitted by contrast timing, patent. ANATOMIC VARIANTS: None  Review of the MIP images confirms the above findings. CT Brain Perfusion Findings: Contrast bolus was deemed insufficient by the RAPID software. Manual post processing of the data will be attempted and, if successful, an addendum will be issued. IMPRESSION: 1. No intracranial arterial occlusion or high-grade stenosis of the intracranial arteries. 2. Bilateral proximal internal carotid artery stenoses, measuring 70 % on the right and 90 % on the left. 3. Small pleural effusions and multiple upper mediastinal lymph nodes. 4. Contrast bolus was deemed insufficient by the RAPID software. Manual post processing of the data will be attempted and, if successful, an addendum will be issued. Aortic Atherosclerosis (ICD10-I70.0). Electronically Signed   By: Ulyses Jarred M.D.   On: 10/20/2020 23:20   MR BRAIN WO CONTRAST  Result Date: 10/21/2020 CLINICAL DATA:  Neuro deficit with acute stroke suspected. EXAM: MRI HEAD WITHOUT CONTRAST TECHNIQUE: Multiplanar, multiecho pulse sequences of the brain and surrounding structures were obtained without intravenous contrast. COMPARISON:  CT and CTA from earlier today FINDINGS: Brain: Small patchy acute cortical infarcts in the left MCA territory posterior division, superior temporal lobe to the parietooccipital convexity. There is mild chronic small vessel ischemia in the cerebral white matter. Mild cerebral volume loss for age. No acute hemorrhage or generalized chronic hemorrhagic injury. No hydrocephalus, mass, or collection. Vascular: Major flow voids are preserved. Skull and upper cervical spine: No focal marrow lesion. Marrow in the visible upper cervical spine and skull base is somewhat hypointense for age, likely red marrow reconversion. Sinuses/Orbits: Mild mastoid opacification with negative nasopharynx. IMPRESSION: 1. Scattered small acute cortical infarcts in the posterior left MCA territory. 2. Age congruent senescent changes. 3. Lower than expected marrow signal,  please correlate with CBC. Electronically Signed   By: Angelica Chessman  Watts M.D.   On: 10/21/2020 04:44   CT Code Stroke Cerebral Perfusion with contrast  Result Date: 10/20/2020 CLINICAL DATA:  Left-sided weakness and aphasia EXAM: CT ANGIOGRAPHY HEAD AND NECK CT PERFUSION BRAIN TECHNIQUE: Multidetector CT imaging of the head and neck was performed using the standard protocol during bolus administration of intravenous contrast. Multiplanar CT image reconstructions and MIPs were obtained to evaluate the vascular anatomy. Carotid stenosis measurements (when applicable) are obtained utilizing NASCET criteria, using the distal internal carotid diameter as the denominator. Multiphase CT imaging of the brain was performed following IV bolus contrast injection. Subsequent parametric perfusion maps were calculated using RAPID software. CONTRAST:  171mL OMNIPAQUE IOHEXOL 350 MG/ML SOLN COMPARISON:  None. FINDINGS: CTA NECK FINDINGS SKELETON: There is no bony spinal canal stenosis. No lytic or blastic lesion. OTHER NECK: Normal pharynx, larynx and major salivary glands. No cervical lymphadenopathy. Unremarkable thyroid gland. UPPER CHEST: Small pleural effusions and multiple upper mediastinal lymph nodes. AORTIC ARCH: There is calcific atherosclerosis of the aortic arch. There is no aneurysm, dissection or hemodynamically significant stenosis of the visualized portion of the aorta. Conventional 3 vessel aortic branching pattern. The visualized proximal subclavian arteries are widely patent. RIGHT CAROTID SYSTEM: No dissection, occlusion or aneurysm. There is mixed density atherosclerosis extending into the proximal ICA, resulting in 70% stenosis. LEFT CAROTID SYSTEM: No dissection, occlusion or aneurysm. There is predominantly calcified atherosclerosis extending into the proximal ICA, resulting in approximately 90 % stenosis. VERTEBRAL ARTERIES: Left dominant configuration. Both origins are clearly patent. There is no  dissection, occlusion or flow-limiting stenosis to the skull base (V1-V3 segments). CTA HEAD FINDINGS POSTERIOR CIRCULATION: --Vertebral arteries: Normal V4 segments. --Inferior cerebellar arteries: Normal. --Basilar artery: Normal. --Superior cerebellar arteries: Normal. --Posterior cerebral arteries (PCA): Normal. The right PCA is predominantly supplied by the posterior communicating artery. Left P-comm is also present. ANTERIOR CIRCULATION: --Intracranial internal carotid arteries: Normal. --Anterior cerebral arteries (ACA): Normal. Both A1 segments are present. Patent anterior communicating artery (a-comm). --Middle cerebral arteries (MCA): Normal. VENOUS SINUSES: As permitted by contrast timing, patent. ANATOMIC VARIANTS: None Review of the MIP images confirms the above findings. CT Brain Perfusion Findings: Contrast bolus was deemed insufficient by the RAPID software. Manual post processing of the data will be attempted and, if successful, an addendum will be issued. IMPRESSION: 1. No intracranial arterial occlusion or high-grade stenosis of the intracranial arteries. 2. Bilateral proximal internal carotid artery stenoses, measuring 70 % on the right and 90 % on the left. 3. Small pleural effusions and multiple upper mediastinal lymph nodes. 4. Contrast bolus was deemed insufficient by the RAPID software. Manual post processing of the data will be attempted and, if successful, an addendum will be issued. Aortic Atherosclerosis (ICD10-I70.0). Electronically Signed   By: Ulyses Jarred M.D.   On: 10/20/2020 23:20   DG Chest Port 1 View  Result Date: 10/20/2020 CLINICAL DATA:  Shortness of breath EXAM: PORTABLE CHEST 1 VIEW COMPARISON:  12/13/2017 FINDINGS: Cardiomegaly. Bilateral interstitial and airspace opacities, likely edema. No effusions. No acute bony abnormality. IMPRESSION: Bilateral interstitial and airspace opacities, most likely edema/CHF. Electronically Signed   By: Rolm Baptise M.D.   On:  10/20/2020 23:05   ECHOCARDIOGRAM COMPLETE  Result Date: 10/21/2020    ECHOCARDIOGRAM REPORT   Patient Name:   GRYPHON VANDERVEEN Date of Exam: 10/21/2020 Medical Rec #:  [id number]    Height:       66.0 in Accession #:    [id number]   Weight:  154.5 lb Date of Birth:  07/01/1925     BSA:          1.792 m Patient Age:    35 years     BP:           187/66 mmHg Patient Gender: M            HR:           85 bpm. Exam Location:  Inpatient Procedure: 2D Echo, Color Doppler and Cardiac Doppler Indications:    Dyspnea  History:        Patient has prior history of Echocardiogram examinations, most                 recent 12/27/2011. Previous Myocardial Infarction and CAD,                 Signs/Symptoms:Syncope; Risk Factors:Hypertension and                 Dyslipidemia.  Sonographer:    Bernadene Person RDCS Referring Phys: [id number] [place]  1. Left ventricular ejection fraction, by estimation, is 55 to 60%. The left ventricle has normal function. The left ventricle has no regional wall motion abnormalities. Left ventricular diastolic parameters are consistent with Grade II diastolic dysfunction (pseudonormalization).  2. Right ventricular systolic function is normal. The right ventricular size is normal. There is moderately elevated pulmonary artery systolic pressure.  3. Left atrial size was severely dilated.  4. Right atrial size was mildly dilated.  5. The mitral valve is normal in structure. Mild to moderate mitral valve regurgitation. Mild mitral stenosis. The mean mitral valve gradient is 5.5 mmHg with average heart rate of 83 bpm. Moderate mitral annular calcification.  6. The aortic valve is abnormal. There is moderate calcification of the aortic valve. Aortic valve regurgitation is trivial. No aortic stenosis is present.  7. The inferior vena cava is normal in size with <50% respiratory variability, suggesting right atrial pressure of 8 mmHg.  8. Cannot exclude atrial level shunt by color flow  Doppler, consider agitated saline exam. FINDINGS  Left Ventricle: Left ventricular ejection fraction, by estimation, is 55 to 60%. The left ventricle has normal function. The left ventricle has no regional wall motion abnormalities. The left ventricular internal cavity size was normal in size. There is  no left ventricular hypertrophy. Left ventricular diastolic parameters are consistent with Grade II diastolic dysfunction (pseudonormalization). Right Ventricle: The right ventricular size is normal. No increase in right ventricular wall thickness. Right ventricular systolic function is normal. There is moderately elevated pulmonary artery systolic pressure. The tricuspid regurgitant velocity is 3.09 m/s, and with an assumed right atrial pressure of 8 mmHg, the estimated right ventricular systolic pressure is 41.3 mmHg. Left Atrium: Left atrial size was severely dilated. Right Atrium: Right atrial size was mildly dilated. Pericardium: There is no evidence of pericardial effusion. Mitral Valve: The mitral valve is normal in structure. Moderate mitral annular calcification. Mild to moderate mitral valve regurgitation. Mild mitral valve stenosis. The mean mitral valve gradient is 5.5 mmHg with average heart rate of 83 bpm. Tricuspid Valve: The tricuspid valve is normal in structure. Tricuspid valve regurgitation is trivial. No evidence of tricuspid stenosis. Aortic Valve: The aortic valve is abnormal. There is moderate calcification of the aortic valve. Aortic valve regurgitation is trivial. No aortic stenosis is present. Pulmonic Valve: The pulmonic valve was not well visualized. Pulmonic valve regurgitation is trivial. No evidence of pulmonic stenosis. Aorta: The aortic  root is normal in size and structure. Venous: The inferior vena cava is normal in size with less than 50% respiratory variability, suggesting right atrial pressure of 8 mmHg. IAS/Shunts: Cannot exclude atrial level shunt by color flow Doppler, consider  agitated saline exam.  LEFT VENTRICLE PLAX 2D LVIDd:         4.85 cm  Diastology LVIDs:         3.17 cm  LV e' medial:    7.62 cm/s LV PW:         0.74 cm  LV E/e' medial:  25.5 LV IVS:        0.75 cm  LV e' lateral:   8.49 cm/s LVOT diam:     2.00 cm  LV E/e' lateral: 22.9 LV SV:         90 LV SV Index:   50 LVOT Area:     3.14 cm  RIGHT VENTRICLE RV S prime:     9.90 cm/s TAPSE (M-mode): 2.8 cm LEFT ATRIUM             Index       RIGHT ATRIUM           Index LA diam:        4.60 cm 2.57 cm/m  RA Area:     17.90 cm LA Vol (A2C):   80.4 ml 44.86 ml/m RA Volume:   49.20 ml  27.45 ml/m LA Vol (A4C):   90.1 ml 50.27 ml/m LA Biplane Vol: 86.8 ml 48.43 ml/m  AORTIC VALVE LVOT Vmax:   120.00 cm/s LVOT Vmean:  86.100 cm/s LVOT VTI:    0.288 m  AORTA Ao Root diam: 3.00 cm MITRAL VALVE                TRICUSPID VALVE MV Area (PHT): 5.13 cm     TR Peak grad:   38.2 mmHg MV Mean grad:  5.5 mmHg     TR Vmax:        309.00 cm/s MV Decel Time: 148 msec MV E velocity: 194.00 cm/s  SHUNTS MV A velocity: 131.00 cm/s  Systemic VTI:  0.29 m MV E/A ratio:  1.48         Systemic Diam: 2.00 cm Cherlynn Kaiser MD Electronically signed by Cherlynn Kaiser MD Signature Date/Time: 10/21/2020/3:23:39 PM    Final    CT HEAD CODE STROKE WO CONTRAST  Result Date: 10/20/2020 CLINICAL DATA:  Code stroke.  Left-sided weakness and aphasia EXAM: CT HEAD WITHOUT CONTRAST TECHNIQUE: Contiguous axial images were obtained from the base of the skull through the vertex without intravenous contrast. COMPARISON:  None. FINDINGS: Brain: There is no mass, hemorrhage or extra-axial collection. The size and configuration of the ventricles and extra-axial CSF spaces are normal. There is hypoattenuation of the periventricular white matter, most commonly indicating chronic ischemic microangiopathy. Vascular: No abnormal hyperdensity of the major intracranial arteries or dural venous sinuses. No intracranial atherosclerosis. Skull: The visualized skull  base, calvarium and extracranial soft tissues are normal. Sinuses/Orbits: No fluid levels or advanced mucosal thickening of the visualized paranasal sinuses. No mastoid or middle ear effusion. The orbits are normal. ASPECTS [facility] Stroke Program Early CT Score) - Ganglionic level infarction (caudate, lentiform nuclei, internal capsule, insula, M1-M3 cortex): 7 - Supraganglionic infarction (M4-M6 cortex): 3 Total score (0-10 with 10 being normal): 10 IMPRESSION: 1. Chronic ischemic microangiopathy without acute intracranial abnormality. 2. ASPECTS is 10. These results were communicated to Dr. Amie Portland at 10:16 pm  on 10/20/2020 by text page via the [facility] messaging system. Electronically Signed   By: Ulyses Jarred M.D.   On: 10/20/2020 22:16     Labs:   Basic Metabolic Panel: Recent Labs  Lab 10/20/20 2202 10/20/20 2202 10/20/20 2209 10/20/20 2209 10/20/20 2319 10/20/20 2319 10/21/20 0442 10/22/20 0350  NA 138  --  138  --  139  --  140 136  K 4.0   < > 4.0   < > 4.1   < > 4.0 4.1  CL 106  --  105  --   --   --  107 105  CO2 22  --   --   --   --   --  23 23  GLUCOSE 172*  --  174*  --   --   --  131* 103*  BUN 23  --  24*  --   --   --  21 24*  CREATININE 1.53*  --  1.70*  --   --   --  1.50* 1.59*  CALCIUM 9.0  --   --   --   --   --  9.3 8.6*   < > = values in this interval not displayed.   GFR Estimated Creatinine Clearance: 26.2 mL/min (A) (by C-G formula based on SCr of 1.59 mg/dL (H)). Liver Function Tests: Recent Labs  Lab 10/20/20 2202 10/22/20 0350  AST 32 30  ALT 22 21  ALKPHOS 57 53  BILITOT 0.4 0.5  PROT 6.1* 5.4*  ALBUMIN 3.2* 2.8*   No results for input(s): LIPASE, AMYLASE in the last 168 hours. No results for input(s): AMMONIA in the last 168 hours. Coagulation profile Recent Labs  Lab 10/20/20 2202  INR 1.2    CBC: Recent Labs  Lab 10/20/20 2202 10/20/20 2209 10/20/20 2319 10/21/20 0442 10/22/20 0350  WBC 81.0*  --   --  96.9* 64.8*    NEUTROABS 51.4*  --   --  64.9* 57.7*  HGB 7.5* 8.2* 9.2* 8.0* 7.2*  HCT 23.7* 24.0* 27.0* 25.3* 22.5*  MCV 92.6  --   --  90.7 90.7  PLT 2,016*  --   --  2,111* 1,793*   Cardiac Enzymes: No results for input(s): CKTOTAL, CKMB, CKMBINDEX, TROPONINI in the last 168 hours. BNP: Invalid input(s): POCBNP CBG: No results for input(s): GLUCAP in the last 168 hours. D-Dimer No results for input(s): DDIMER in the last 72 hours. Hgb A1c Recent Labs    10/21/20 0442  HGBA1C 6.1*   Lipid Profile Recent Labs    10/21/20 0442  CHOL 169  HDL 25*  LDLCALC 115*  TRIG 146  CHOLHDL 6.8   Thyroid function studies No results for input(s): TSH, T4TOTAL, T3FREE, THYROIDAB in the last 72 hours.  Invalid input(s): FREET3 Anemia work up No results for input(s): VITAMINB12, FOLATE, FERRITIN, TIBC, IRON, RETICCTPCT in the last 72 hours. Microbiology Recent Results (from the past 240 hour(s))  Respiratory Panel by RT PCR (Flu A&B, Covid) - Nasopharyngeal Swab     Status: None   Collection Time: 10/20/20 10:54 PM   Specimen: Nasopharyngeal Swab  Result Value Ref Range Status   SARS Coronavirus 2 by RT PCR NEGATIVE NEGATIVE Final    Comment: (NOTE) SARS-CoV-2 target nucleic acids are NOT DETECTED.  The SARS-CoV-2 RNA is generally detectable in upper respiratoy specimens during the acute phase of infection. The lowest concentration of SARS-CoV-2 viral copies this assay can detect is 131 copies/mL. A negative result  does not preclude SARS-Cov-2 infection and should not be used as the sole basis for treatment or other patient management decisions. A negative result may occur with  improper specimen collection/handling, submission of specimen other than nasopharyngeal swab, presence of viral mutation(s) within the areas targeted by this assay, and inadequate number of viral copies (<131 copies/mL). A negative result must be combined with clinical observations, patient history, and  epidemiological information. The expected result is Negative.  Fact Sheet for Patients:  PinkCheek.be  Fact Sheet for Healthcare Providers:  GravelBags.it  This test is no t yet approved or cleared by the [place] FDA and  has been authorized for detection and/or diagnosis of SARS-CoV-2 by FDA under an Emergency Use Authorization (EUA). This EUA will remain  in effect (meaning this test can be used) for the duration of the COVID-19 declaration under Section 564(b)(1) of the Act, 21 U.S.C. section 360bbb-3(b)(1), unless the authorization is terminated or revoked sooner.     Influenza A by PCR NEGATIVE NEGATIVE Final   Influenza B by PCR NEGATIVE NEGATIVE Final    Comment: (NOTE) The Xpert Xpress SARS-CoV-2/FLU/RSV assay is intended as an aid in  the diagnosis of influenza from Nasopharyngeal swab specimens and  should not be used as a sole basis for treatment. Nasal washings and  aspirates are unacceptable for Xpert Xpress SARS-CoV-2/FLU/RSV  testing.  Fact Sheet for Patients: PinkCheek.be  Fact Sheet for Healthcare Providers: GravelBags.it  This test is not yet approved or cleared by the [place] FDA and  has been authorized for detection and/or diagnosis of SARS-CoV-2 by  FDA under an Emergency Use Authorization (EUA). This EUA will remain  in effect (meaning this test can be used) for the duration of the  Covid-19 declaration under Section 564(b)(1) of the Act, 21  U.S.C. section 360bbb-3(b)(1), unless the authorization is  terminated or revoked. Performed at [facility], [place] [address] [place], [place] [address]      Discharge Instructions:   Discharge Instructions     Ambulatory referral to Neurology   Complete by: As directed    An appointment is requested in approximately: 4 weeks   Diet general   Complete by: As directed     Discharge instructions   Complete by: As directed    Follow-up with your primary care physician in 1 to 2 weeks.  Follow-up with Dr. Lorenso Courier ,oncology in the clinic in 1 to 2 weeks.  Will need to check blood work at that time.  Follow-up with vascular surgery Dr. Donnetta Hutching once ok with Dr Lorenso Courier (for discussing surgery).  Follow-up with [facility] neurology in 3 to 4 weeks.  Referral has been made.  Continue baby aspirin.   Increase activity slowly   Complete by: As directed       Allergies as of 10/22/2020       Reactions   Hctz [hydrochlorothiazide] Other (See Comments)   Has had hyponatremia        Medication List     STOP taking these medications    doxazosin 4 MG tablet Commonly known as: CARDURA   metoprolol succinate 25 MG 24 hr tablet Commonly known as: Toprol XL   tranexamic acid 650 MG Tabs tablet Commonly known as: LYSTEDA       TAKE these medications    albuterol 108 (90 Base) MCG/ACT inhaler Commonly known as: VENTOLIN HFA Inhale 2 puffs into the lungs as needed for wheezing or shortness of breath.   amLODipine 10 MG  tablet Commonly known as: NORVASC TAKE 1 TABLET BY MOUTH DAILY   aspirin 81 MG tablet Take 1 tablet (81 mg total) by mouth daily.   atorvastatin 40 MG tablet Commonly known as: LIPITOR Take 1 tablet (40 mg total) by mouth daily. Start taking on: October 23, 2020   b complex vitamins tablet Take 1 tablet by mouth daily.   DUPIXENT [place] Inject into the skin every 14 (fourteen) days.   hydroxyurea 500 MG capsule Commonly known as: HYDREA Take 1 capsule (500 mg total) by mouth daily. May take with food to minimize GI side effects. Start taking on: October 23, 2020   magnesium gluconate 500 MG tablet Commonly known as: MAGONATE Take 500 mg by mouth daily.   mometasone-formoterol 100-5 MCG/ACT Aero Commonly known as: DULERA Inhale 2 puffs into the lungs every 12 (twelve) hours.   Nattokinase 100 MG Caps Take 200 mg by mouth daily.    omeprazole 40 MG capsule Commonly known as: PRILOSEC Take 40 mg by mouth daily.   TURMERIC PO Take 2 tablets by mouth daily.   vitamin C 1000 MG tablet Take 1,000 mg by mouth daily.   VITAMIN E PO Take 1 tablet by mouth daily.   ZINC PO Take 1 tablet by mouth daily.           Time coordinating discharge: 39 minutes  Signed:  Jaydalynn Olivero  [facility] 10/22/2020, 11:54 AM

## 2020-10-22 NOTE — Progress Notes (Signed)
Pt's IVs removed; sites are clean, dry and intact. Discharge instructions were reviewed with pt and spouse; voiced understanding. Pt's personal belongings were packed and sent with pt. Pt. transported via wheelchair to private vehicle driven by wife.

## 2020-10-23 ENCOUNTER — Inpatient Hospital Stay: Payer: Medicare Other

## 2020-10-23 ENCOUNTER — Inpatient Hospital Stay: Payer: Medicare Other | Admitting: Hematology and Oncology

## 2020-10-24 ENCOUNTER — Telehealth: Payer: Self-pay | Admitting: Hematology and Oncology

## 2020-10-24 ENCOUNTER — Other Ambulatory Visit: Payer: Self-pay

## 2020-10-24 NOTE — Telephone Encounter (Signed)
Scheduled per 11/19 sch msg. Called and spoke with pt, confirmed 11/24 and 12/1 appts

## 2020-10-27 ENCOUNTER — Other Ambulatory Visit: Payer: Self-pay | Admitting: *Deleted

## 2020-10-27 ENCOUNTER — Ambulatory Visit: Payer: Medicare Other | Admitting: Cardiology

## 2020-10-27 NOTE — Patient Outreach (Signed)
[facility]) Care Management  10/27/2020  Patrick Rodriguez. 1925/04/02 [id number]   RED ON EMMI ALERT Day # 1 Date: 11/19 Red Alert Reason: Not scheduled follow up   Outreach attempt #1, successful.  Identity verified.  This care manager introduced self and stated purpose of call.  [facility] care management services explained.  Member immediately state he is doing well and denies any needs.  Report stroke was minor, no deficits noted.  Remains independent, wife lives in the home for support.  He has received follow up call from PCP office, will schedule office visit.  Appointment with hematologist on 11/24, no visit scheduled with neurologist.  Advised per AVS appointment should be made for within the next 4 weeks.  State he does not feel the need but will consider it.  He is scheduled for carotid endarterectomy on 12/15, offered follow up but he declines.  Agrees to contact this care manager if needs change.    Plan: RN CM will close case at this time as member denies need.  Will send successful outreach letter with this care manager's contact information for future purposes.  [name], [place], MSN [place] [phone]

## 2020-10-28 ENCOUNTER — Other Ambulatory Visit: Payer: Self-pay | Admitting: Hematology and Oncology

## 2020-10-28 DIAGNOSIS — D469 Myelodysplastic syndrome, unspecified: Secondary | ICD-10-CM

## 2020-10-29 ENCOUNTER — Inpatient Hospital Stay: Payer: Medicare Other | Attending: Hematology and Oncology | Admitting: Hematology and Oncology

## 2020-10-29 ENCOUNTER — Other Ambulatory Visit: Payer: Self-pay

## 2020-10-29 ENCOUNTER — Inpatient Hospital Stay: Payer: Medicare Other

## 2020-10-29 ENCOUNTER — Encounter: Payer: Self-pay | Admitting: Hematology and Oncology

## 2020-10-29 VITALS — BP 174/75 | HR 76 | Temp 97.1°F | Resp 18 | Ht 66.0 in | Wt 148.3 lb

## 2020-10-29 DIAGNOSIS — D75839 Thrombocytosis, unspecified: Secondary | ICD-10-CM | POA: Diagnosis not present

## 2020-10-29 DIAGNOSIS — D469 Myelodysplastic syndrome, unspecified: Secondary | ICD-10-CM | POA: Insufficient documentation

## 2020-10-29 DIAGNOSIS — D649 Anemia, unspecified: Secondary | ICD-10-CM

## 2020-10-29 LAB — CMP (CANCER CENTER ONLY)
ALT: 16 U/L (ref 0–44)
AST: 28 U/L (ref 15–41)
Albumin: 3.6 g/dL (ref 3.5–5.0)
Alkaline Phosphatase: 66 U/L (ref 38–126)
Anion gap: 9 (ref 5–15)
BUN: 32 mg/dL — ABNORMAL HIGH (ref 8–23)
CO2: 21 mmol/L — ABNORMAL LOW (ref 22–32)
Calcium: 9.4 mg/dL (ref 8.9–10.3)
Chloride: 106 mmol/L (ref 98–111)
Creatinine: 1.5 mg/dL — ABNORMAL HIGH (ref 0.61–1.24)
GFR, Estimated: 43 mL/min — ABNORMAL LOW (ref >60)
Glucose, Bld: 75 mg/dL (ref 70–99)
Potassium: 4.3 mmol/L (ref 3.5–5.1)
Sodium: 136 mmol/L (ref 135–145)
Total Bilirubin: 0.4 mg/dL (ref 0.3–1.2)
Total Protein: 7 g/dL (ref 6.5–8.1)

## 2020-10-29 LAB — SAMPLE TO BLOOD BANK

## 2020-10-29 LAB — CBC WITH DIFFERENTIAL (CANCER CENTER ONLY)
Abs Immature Granulocytes: 9.78 10*3/uL — ABNORMAL HIGH (ref 0.00–0.07)
Basophils Absolute: 0.3 10*3/uL — ABNORMAL HIGH (ref 0.0–0.1)
Basophils Relative: 1 %
Eosinophils Absolute: 0.8 10*3/uL — ABNORMAL HIGH (ref 0.0–0.5)
Eosinophils Relative: 2 %
HCT: 26.8 % — ABNORMAL LOW (ref 39.0–52.0)
Hemoglobin: 8.5 g/dL — ABNORMAL LOW (ref 13.0–17.0)
Immature Granulocytes: 21 %
Lymphocytes Relative: 12 %
Lymphs Abs: 5.5 10*3/uL — ABNORMAL HIGH (ref 0.7–4.0)
MCH: 28.6 pg (ref 26.0–34.0)
MCHC: 31.7 g/dL (ref 30.0–36.0)
MCV: 90.2 fL (ref 80.0–100.0)
Monocytes Absolute: 0.8 10*3/uL (ref 0.1–1.0)
Monocytes Relative: 2 %
Neutro Abs: 29.8 10*3/uL — ABNORMAL HIGH (ref 1.7–7.7)
Neutrophils Relative %: 62 %
Platelet Count: 1087 10*3/uL (ref 150–400)
RBC: 2.97 MIL/uL — ABNORMAL LOW (ref 4.22–5.81)
RDW: 21.2 % — ABNORMAL HIGH (ref 11.5–15.5)
Smear Review: INCREASED
WBC Count: 46.9 10*3/uL — ABNORMAL HIGH (ref 4.0–10.5)
nRBC: 0.9 % — ABNORMAL HIGH (ref 0.0–0.2)

## 2020-10-29 LAB — LACTATE DEHYDROGENASE: LDH: 814 U/L — ABNORMAL HIGH (ref 98–192)

## 2020-10-29 NOTE — Progress Notes (Signed)
[place] [phone]   [phone]  PROGRESS NOTE  Patient Care Team: Maury Dus, MD as PCP - General (Family Medicine) [place]ique, Peter M, MD as PCP - Cardiology (Cardiology)  Hematological/Oncological History  #Myelodysplastic Syndrome/ NOS Myeloproliferative Syndrome 1)07/26/2018: WBC 5.1, Hgb 11.6, MCV 94.4, Plt 363 2) 9//2020: WBC 25.1, Hgb 9.2, Plt 1083, MCV 93.2 (in context of eczema flare) 3) 09/20/2019: Iron 128, TIBC 242 (low), Iron sat 53%, transferrin 173 4) 10/15/2019: WBC 14.3, Hgb 8.8, Plt 485, MCV 90.6. FOB negative 5) 11/07/2019: Establish care with Dr. Lorenso Courier 6) 11/26/2019: Bone marrow biopsy performed. Showed hypercellular marrow with 9% blasts, concern for MDS vs MPN. MDS panel negative for definitive mutation. WBC 29.3, Hgb 8.3, Plt 1008.  7) 12/14/2019: WBC 30.1, Hgb 7.8, Plt 1718. Received 1 units of PRBC for symptomatic anemia. BCR/ABL and JAK2 w/ reflex panels sent. Positive for U2AF1 and SRSF2 gene mutations. Neither are canonical mutations for MPN/MDS 8) 02/21/2020: received 1 unit of PRBC for Hgb 8.5 (symptomatic) 9) 04/24/2020: received 1 unit of PRBC for Hgb 8.2 (symptomatic) 10) 05/19/2020:  received 1 unit of PRBC for Hgb 8.0 (symptomatic) 11) 10/20/2020: presented to ED with aphasia, concern for stroke. Found to have WBC 96.9, Hgb 8.0, Plt 2111. Started hydoryxurea 578m PO daily.   Interval History:  JMarvell Tamer 86y.o. male with medical history significant for leukocytosis, thrombocytosis, and anemia presents for a follow up visit. The patient's last visit was on 08/28/20. In the interim since the last visit Mr. BStuberwas admitted for aphasia and found to have a stroke.  On exam today Mr. BTesslerports that he has been well since his discharge from the hospital.  He has had no further focal neurological defects and no further issues with aphasia.  He notes that he has been taking the hydroxyurea 5 and a milligrams p.o.  daily without any difficulty.  He notes he does not cause any stomach upset, nausea, vomiting, or diarrhea.  He reports that his appetite is quite good and that despite this he is losing weight.  He reports that he eats about 2 meals per day.  He reports that he is breathing well and only feels a little mentally foggy on occasion.  He denies any fevers, chills, sweats.  A full 10 point ROS is listed below..Marland Kitchen MEDICAL HISTORY:  Past Medical History:  Diagnosis Date  . Allergic rhinitis   . Basal cell carcinoma 01/07/2020   left preauricular  . Basal cell carcinoma 06/25/2020   right tip of nose  . Carotid artery stenosis    12/2011:  50-69% on the left and 1-49% on the right;  Carotid [place](11/2013): Bilateral 40-59%; repeat 1 year  . Cataract   . Coronary artery disease    a.  s/p stenting of the LAD in 2000;  b. Lexiscan Myoview (12/2013): No ischemia, EF 70%; normal study  . ED (erectile dysfunction)   . GERD (gastroesophageal reflux disease)   . Hyperlipidemia   . Hyperplastic colon polyp 11/15/2013   x3  . Hypertension   . [place]   . Normal echocardiogram Jan 2013   EF of 55% and no wall motion abnormalities  . Normal nuclear stress test 2008  . Obesity   . Palpitations    Holter (11/2013): NSR, sinus brady, PVCs, rare blocked PAC, no sig arrhythmia  . Squamous cell carcinoma of skin 01/07/2020   right lat elbow  . Substance abuse ([place]   .  Syncope Jan 2013   felt to be vasovagal; had normal echo, carotids ok, 1st degree AV block with mention (but no tracing) of 2nd degree type I AV block while in [place]  . Tubular adenoma 11/15/2013   x2    SURGICAL HISTORY: Past Surgical History:  Procedure Laterality Date  . APPENDECTOMY    . CARDIAC CATHETERIZATION  01/20/1999   EF 60%  . CARDIOVASCULAR STRESS TEST  04/19/2007   EF 74%  . CORONARY STENT PLACEMENT  2000   LAD  . DOPPLER ECHOCARDIOGRAPHY  02/06/1998   EF 60%  . TONSILLECTOMY     ALLERGIES:  is allergic  to hctz [hydrochlorothiazide].  MEDICATIONS:  Current Outpatient Medications  Medication Sig Dispense Refill  . albuterol (VENTOLIN HFA) 108 (90 Base) MCG/ACT inhaler Inhale 2 puffs into the lungs as needed for wheezing or shortness of breath.    Marland Kitchen amLODipine (NORVASC) 10 MG tablet TAKE 1 TABLET BY MOUTH DAILY (Patient taking differently: Take 10 mg by mouth daily. ) 90 tablet 3  . Ascorbic Acid (VITAMIN C) 1000 MG tablet Take 1,000 mg by mouth daily.      Marland Kitchen aspirin 81 MG tablet Take 1 tablet (81 mg total) by mouth daily.    Marland Kitchen atorvastatin (LIPITOR) 40 MG tablet Take 1 tablet (40 mg total) by mouth daily. 30 tablet 2  . b complex vitamins tablet Take 1 tablet by mouth daily.      . Dupilumab (DUPIXENT Festus) Inject into the skin every 14 (fourteen) days.    . hydroxyurea (HYDREA) 500 MG capsule Take 1 capsule (500 mg total) by mouth daily. May take with food to minimize GI side effects. 30 capsule 2  . magnesium gluconate (MAGONATE) 500 MG tablet Take 500 mg by mouth daily.      . mometasone-formoterol (DULERA) 100-5 MCG/ACT AERO Inhale 2 puffs into the lungs every 12 (twelve) hours. (Patient not taking: Reported on 10/29/2020) 1 each 11  . Multiple Vitamins-Minerals (ZINC PO) Take 1 tablet by mouth daily.     . Nattokinase 100 MG CAPS Take 200 mg by mouth daily.     Marland Kitchen omeprazole (PRILOSEC) 40 MG capsule Take 40 mg by mouth daily.    . TURMERIC PO Take 2 tablets by mouth daily.     Marland Kitchen VITAMIN E PO Take 1 tablet by mouth daily.     No current facility-administered medications for this visit.    REVIEW OF SYSTEMS:   Constitutional: ( - ) fevers, ( - )  chills , ( - ) night sweats Eyes: ( - ) blurriness of vision, ( - ) double vision, ( - ) watery eyes Ears, nose, mouth, throat, and face: ( - ) mucositis, ( - ) sore throat Respiratory: ( - ) cough, ( - ) dyspnea, ( - ) wheezes Cardiovascular: ( - ) palpitation, ( - ) chest discomfort, ( - ) lower extremity swelling Gastrointestinal:  ( - )  nausea, ( - ) heartburn, ( - ) change in bowel habits Skin: ( - ) abnormal skin rashes Lymphatics: ( - ) new lymphadenopathy, ( - ) easy bruising Neurological: ( - ) numbness, ( - ) tingling, ( - ) new weaknesses Behavioral/Psych: ( - ) mood change, ( - ) new changes  All other systems were reviewed with the patient and are negative.  PHYSICAL EXAMINATION: ECOG PERFORMANCE STATUS: 1 - Symptomatic but completely ambulatory  Vitals:   10/29/20 1245  BP: (!) 174/75  Pulse: 76  Resp:  18  Temp: (!) 97.1 F (36.2 C)  SpO2: 99%   Filed Weights   10/29/20 1245  Weight: 148 lb 4.8 oz (67.3 kg)    GENERAL: well appearing elderly Caucasian male in NAD SKIN: skin color, texture, turgor are normal, no rashes or significant lesions EYES: conjunctiva are pink and non-injected, sclera clear LUNGS: clear to auscultation and percussion with normal breathing effort HEART: regular rate & rhythm and no murmurs and +1 bilateral lower extremity edema Musculoskeletal: no cyanosis of digits and no clubbing  PSYCH: alert & oriented x 3, fluent speech NEURO: no focal motor/sensory deficits  LABORATORY DATA:  I have reviewed the data as listed CBC Latest Ref Rng & Units 10/29/2020 10/22/2020 10/21/2020  WBC 4.0 - 10.5 K/uL 46.9(H) 64.8(HH) 96.9(HH)  Hemoglobin 13.0 - 17.0 g/dL 8.5(L) 7.2(L) 8.0(L)  Hematocrit 39 - 52 % 26.8(L) 22.5(L) 25.3(L)  Platelets 150 - 400 K/uL 1,087(HH) 1,793(HH) 2,111(HH)    CMP Latest Ref Rng & Units 10/29/2020 10/22/2020 10/21/2020  Glucose 70 - 99 mg/dL 75 103(H) 131(H)  BUN 8 - 23 mg/dL 32(H) 24(H) 21  Creatinine 0.61 - 1.24 mg/dL 1.50(H) 1.59(H) 1.50(H)  Sodium 135 - 145 mmol/L 136 136 140  Potassium 3.5 - 5.1 mmol/L 4.3 4.1 4.0  Chloride 98 - 111 mmol/L 106 105 107  CO2 22 - 32 mmol/L 21(L) 23 23  Calcium 8.9 - 10.3 mg/dL 9.4 8.6(L) 9.3  Total Protein 6.5 - 8.1 g/dL 7.0 5.4(L) -  Total Bilirubin 0.3 - 1.2 mg/dL 0.4 0.5 -  Alkaline Phos 38 - 126 U/L 66 53 -   AST 15 - 41 U/L 28 30 -  ALT 0 - 44 U/L 16 21 -    RADIOGRAPHIC STUDIES: CT Code Stroke CTA Head W/WO contrast  Result Date: 10/20/2020 CLINICAL DATA:  Left-sided weakness and aphasia EXAM: CT ANGIOGRAPHY HEAD AND NECK CT PERFUSION BRAIN TECHNIQUE: Multidetector CT imaging of the head and neck was performed using the standard protocol during bolus administration of intravenous contrast. Multiplanar CT image reconstructions and MIPs were obtained to evaluate the vascular anatomy. Carotid stenosis measurements (when applicable) are obtained utilizing NASCET criteria, using the distal internal carotid diameter as the denominator. Multiphase CT imaging of the brain was performed following IV bolus contrast injection. Subsequent parametric perfusion maps were calculated using RAPID software. CONTRAST:  176m OMNIPAQUE IOHEXOL 350 MG/ML SOLN COMPARISON:  None. FINDINGS: CTA NECK FINDINGS SKELETON: There is no bony spinal canal stenosis. No lytic or blastic lesion. OTHER NECK: Normal pharynx, larynx and major salivary glands. No cervical lymphadenopathy. Unremarkable thyroid gland. UPPER CHEST: Small pleural effusions and multiple upper mediastinal lymph nodes. AORTIC ARCH: There is calcific atherosclerosis of the aortic arch. There is no aneurysm, dissection or hemodynamically significant stenosis of the visualized portion of the aorta. Conventional 3 vessel aortic branching pattern. The visualized proximal subclavian arteries are widely patent. RIGHT CAROTID SYSTEM: No dissection, occlusion or aneurysm. There is mixed density atherosclerosis extending into the proximal ICA, resulting in 70% stenosis. LEFT CAROTID SYSTEM: No dissection, occlusion or aneurysm. There is predominantly calcified atherosclerosis extending into the proximal ICA, resulting in approximately 90 % stenosis. VERTEBRAL ARTERIES: Left dominant configuration. Both origins are clearly patent. There is no dissection, occlusion or flow-limiting  stenosis to the skull base (V1-V3 segments). CTA HEAD FINDINGS POSTERIOR CIRCULATION: --Vertebral arteries: Normal V4 segments. --Inferior cerebellar arteries: Normal. --Basilar artery: Normal. --Superior cerebellar arteries: Normal. --Posterior cerebral arteries (PCA): Normal. The right PCA is predominantly supplied by the posterior  communicating artery. Left P-comm is also present. ANTERIOR CIRCULATION: --Intracranial internal carotid arteries: Normal. --Anterior cerebral arteries (ACA): Normal. Both A1 segments are present. Patent anterior communicating artery (a-comm). --Middle cerebral arteries (MCA): Normal. VENOUS SINUSES: As permitted by contrast timing, patent. ANATOMIC VARIANTS: None Review of the MIP images confirms the above findings. CT Brain Perfusion Findings: Contrast bolus was deemed insufficient by the RAPID software. Manual post processing of the data will be attempted and, if successful, an addendum will be issued. IMPRESSION: 1. No intracranial arterial occlusion or high-grade stenosis of the intracranial arteries. 2. Bilateral proximal internal carotid artery stenoses, measuring 70 % on the right and 90 % on the left. 3. Small pleural effusions and multiple upper mediastinal lymph nodes. 4. Contrast bolus was deemed insufficient by the RAPID software. Manual post processing of the data will be attempted and, if successful, an addendum will be issued. Aortic Atherosclerosis (ICD10-I70.0). Electronically Signed   By: Ulyses Jarred M.D.   On: 10/20/2020 23:20   CT Code Stroke CTA Neck W/WO contrast  Result Date: 10/20/2020 CLINICAL DATA:  Left-sided weakness and aphasia EXAM: CT ANGIOGRAPHY HEAD AND NECK CT PERFUSION BRAIN TECHNIQUE: Multidetector CT imaging of the head and neck was performed using the standard protocol during bolus administration of intravenous contrast. Multiplanar CT image reconstructions and MIPs were obtained to evaluate the vascular anatomy. Carotid stenosis  measurements (when applicable) are obtained utilizing NASCET criteria, using the distal internal carotid diameter as the denominator. Multiphase CT imaging of the brain was performed following IV bolus contrast injection. Subsequent parametric perfusion maps were calculated using RAPID software. CONTRAST:  159m OMNIPAQUE IOHEXOL 350 MG/ML SOLN COMPARISON:  None. FINDINGS: CTA NECK FINDINGS SKELETON: There is no bony spinal canal stenosis. No lytic or blastic lesion. OTHER NECK: Normal pharynx, larynx and major salivary glands. No cervical lymphadenopathy. Unremarkable thyroid gland. UPPER CHEST: Small pleural effusions and multiple upper mediastinal lymph nodes. AORTIC ARCH: There is calcific atherosclerosis of the aortic arch. There is no aneurysm, dissection or hemodynamically significant stenosis of the visualized portion of the aorta. Conventional 3 vessel aortic branching pattern. The visualized proximal subclavian arteries are widely patent. RIGHT CAROTID SYSTEM: No dissection, occlusion or aneurysm. There is mixed density atherosclerosis extending into the proximal ICA, resulting in 70% stenosis. LEFT CAROTID SYSTEM: No dissection, occlusion or aneurysm. There is predominantly calcified atherosclerosis extending into the proximal ICA, resulting in approximately 90 % stenosis. VERTEBRAL ARTERIES: Left dominant configuration. Both origins are clearly patent. There is no dissection, occlusion or flow-limiting stenosis to the skull base (V1-V3 segments). CTA HEAD FINDINGS POSTERIOR CIRCULATION: --Vertebral arteries: Normal V4 segments. --Inferior cerebellar arteries: Normal. --Basilar artery: Normal. --Superior cerebellar arteries: Normal. --Posterior cerebral arteries (PCA): Normal. The right PCA is predominantly supplied by the posterior communicating artery. Left P-comm is also present. ANTERIOR CIRCULATION: --Intracranial internal carotid arteries: Normal. --Anterior cerebral arteries (ACA): Normal. Both A1  segments are present. Patent anterior communicating artery (a-comm). --Middle cerebral arteries (MCA): Normal. VENOUS SINUSES: As permitted by contrast timing, patent. ANATOMIC VARIANTS: None Review of the MIP images confirms the above findings. CT Brain Perfusion Findings: Contrast bolus was deemed insufficient by the RAPID software. Manual post processing of the data will be attempted and, if successful, an addendum will be issued. IMPRESSION: 1. No intracranial arterial occlusion or high-grade stenosis of the intracranial arteries. 2. Bilateral proximal internal carotid artery stenoses, measuring 70 % on the right and 90 % on the left. 3. Small pleural effusions and multiple upper mediastinal lymph  nodes. 4. Contrast bolus was deemed insufficient by the RAPID software. Manual post processing of the data will be attempted and, if successful, an addendum will be issued. Aortic Atherosclerosis (ICD10-I70.0). Electronically Signed   By: Ulyses Jarred M.D.   On: 10/20/2020 23:20   MR BRAIN WO CONTRAST  Result Date: 10/21/2020 CLINICAL DATA:  Neuro deficit with acute stroke suspected. EXAM: MRI HEAD WITHOUT CONTRAST TECHNIQUE: Multiplanar, multiecho pulse sequences of the brain and surrounding structures were obtained without intravenous contrast. COMPARISON:  CT and CTA from earlier today FINDINGS: Brain: Small patchy acute cortical infarcts in the left MCA territory posterior division, superior temporal lobe to the parietooccipital convexity. There is mild chronic small vessel ischemia in the cerebral white matter. Mild cerebral volume loss for age. No acute hemorrhage or generalized chronic hemorrhagic injury. No hydrocephalus, mass, or collection. Vascular: Major flow voids are preserved. Skull and upper cervical spine: No focal marrow lesion. Marrow in the visible upper cervical spine and skull base is somewhat hypointense for age, likely red marrow reconversion. Sinuses/Orbits: Mild mastoid opacification  with negative nasopharynx. IMPRESSION: 1. Scattered small acute cortical infarcts in the posterior left MCA territory. 2. Age congruent senescent changes. 3. Lower than expected marrow signal, please correlate with CBC. Electronically Signed   By: Monte Fantasia M.D.   On: 10/21/2020 04:44   CT Code Stroke Cerebral Perfusion with contrast  Result Date: 10/20/2020 CLINICAL DATA:  Left-sided weakness and aphasia EXAM: CT ANGIOGRAPHY HEAD AND NECK CT PERFUSION BRAIN TECHNIQUE: Multidetector CT imaging of the head and neck was performed using the standard protocol during bolus administration of intravenous contrast. Multiplanar CT image reconstructions and MIPs were obtained to evaluate the vascular anatomy. Carotid stenosis measurements (when applicable) are obtained utilizing NASCET criteria, using the distal internal carotid diameter as the denominator. Multiphase CT imaging of the brain was performed following IV bolus contrast injection. Subsequent parametric perfusion maps were calculated using RAPID software. CONTRAST:  156m OMNIPAQUE IOHEXOL 350 MG/ML SOLN COMPARISON:  None. FINDINGS: CTA NECK FINDINGS SKELETON: There is no bony spinal canal stenosis. No lytic or blastic lesion. OTHER NECK: Normal pharynx, larynx and major salivary glands. No cervical lymphadenopathy. Unremarkable thyroid gland. UPPER CHEST: Small pleural effusions and multiple upper mediastinal lymph nodes. AORTIC ARCH: There is calcific atherosclerosis of the aortic arch. There is no aneurysm, dissection or hemodynamically significant stenosis of the visualized portion of the aorta. Conventional 3 vessel aortic branching pattern. The visualized proximal subclavian arteries are widely patent. RIGHT CAROTID SYSTEM: No dissection, occlusion or aneurysm. There is mixed density atherosclerosis extending into the proximal ICA, resulting in 70% stenosis. LEFT CAROTID SYSTEM: No dissection, occlusion or aneurysm. There is predominantly  calcified atherosclerosis extending into the proximal ICA, resulting in approximately 90 % stenosis. VERTEBRAL ARTERIES: Left dominant configuration. Both origins are clearly patent. There is no dissection, occlusion or flow-limiting stenosis to the skull base (V1-V3 segments). CTA HEAD FINDINGS POSTERIOR CIRCULATION: --Vertebral arteries: Normal V4 segments. --Inferior cerebellar arteries: Normal. --Basilar artery: Normal. --Superior cerebellar arteries: Normal. --Posterior cerebral arteries (PCA): Normal. The right PCA is predominantly supplied by the posterior communicating artery. Left P-comm is also present. ANTERIOR CIRCULATION: --Intracranial internal carotid arteries: Normal. --Anterior cerebral arteries (ACA): Normal. Both A1 segments are present. Patent anterior communicating artery (a-comm). --Middle cerebral arteries (MCA): Normal. VENOUS SINUSES: As permitted by contrast timing, patent. ANATOMIC VARIANTS: None Review of the MIP images confirms the above findings. CT Brain Perfusion Findings: Contrast bolus was deemed insufficient by the RAPID software.  Manual post processing of the data will be attempted and, if successful, an addendum will be issued. IMPRESSION: 1. No intracranial arterial occlusion or high-grade stenosis of the intracranial arteries. 2. Bilateral proximal internal carotid artery stenoses, measuring 70 % on the right and 90 % on the left. 3. Small pleural effusions and multiple upper mediastinal lymph nodes. 4. Contrast bolus was deemed insufficient by the RAPID software. Manual post processing of the data will be attempted and, if successful, an addendum will be issued. Aortic Atherosclerosis (ICD10-I70.0). Electronically Signed   By: Ulyses Jarred M.D.   On: 10/20/2020 23:20   DG Chest Port 1 View  Result Date: 10/20/2020 CLINICAL DATA:  Shortness of breath EXAM: PORTABLE CHEST 1 VIEW COMPARISON:  12/13/2017 FINDINGS: Cardiomegaly. Bilateral interstitial and airspace opacities,  likely edema. No effusions. No acute bony abnormality. IMPRESSION: Bilateral interstitial and airspace opacities, most likely edema/CHF. Electronically Signed   By: Rolm Baptise M.D.   On: 10/20/2020 23:05   ECHOCARDIOGRAM COMPLETE  Result Date: 10/21/2020    ECHOCARDIOGRAM REPORT   Patient Name:   UGO THOMA Date of Exam: 10/21/2020 Medical Rec #:  [id number]    Height:       66.0 in Accession #:    [id number]   Weight:       154.5 lb Date of Birth:  Aug 13, 1925     BSA:          1.792 m Patient Age:    86 years     BP:           187/66 mmHg Patient Gender: M            HR:           85 bpm. Exam Location:  Inpatient Procedure: 2D Echo, Color Doppler and Cardiac Doppler Indications:    Dyspnea  History:        Patient has prior history of Echocardiogram examinations, most                 recent 12/27/2011. Previous Myocardial Infarction and CAD,                 Signs/Symptoms:Syncope; Risk Factors:Hypertension and                 Dyslipidemia.  Sonographer:    Bernadene Person RDCS Referring Phys: [id number] Allison  1. Left ventricular ejection fraction, by estimation, is 55 to 60%. The left ventricle has normal function. The left ventricle has no regional wall motion abnormalities. Left ventricular diastolic parameters are consistent with Grade II diastolic dysfunction (pseudonormalization).  2. Right ventricular systolic function is normal. The right ventricular size is normal. There is moderately elevated pulmonary artery systolic pressure.  3. Left atrial size was severely dilated.  4. Right atrial size was mildly dilated.  5. The mitral valve is normal in structure. Mild to moderate mitral valve regurgitation. Mild mitral stenosis. The mean mitral valve gradient is 5.5 mmHg with average heart rate of 83 bpm. Moderate mitral annular calcification.  6. The aortic valve is abnormal. There is moderate calcification of the aortic valve. Aortic valve regurgitation is trivial. No aortic stenosis is  present.  7. The inferior vena cava is normal in size with <50% respiratory variability, suggesting right atrial pressure of 8 mmHg.  8. Cannot exclude atrial level shunt by color flow Doppler, consider agitated saline exam. FINDINGS  Left Ventricle: Left ventricular ejection fraction, by estimation, is 55 to 60%. The left  ventricle has normal function. The left ventricle has no regional wall motion abnormalities. The left ventricular internal cavity size was normal in size. There is  no left ventricular hypertrophy. Left ventricular diastolic parameters are consistent with Grade II diastolic dysfunction (pseudonormalization). Right Ventricle: The right ventricular size is normal. No increase in right ventricular wall thickness. Right ventricular systolic function is normal. There is moderately elevated pulmonary artery systolic pressure. The tricuspid regurgitant velocity is 3.09 m/s, and with an assumed right atrial pressure of 8 mmHg, the estimated right ventricular systolic pressure is 89.3 mmHg. Left Atrium: Left atrial size was severely dilated. Right Atrium: Right atrial size was mildly dilated. Pericardium: There is no evidence of pericardial effusion. Mitral Valve: The mitral valve is normal in structure. Moderate mitral annular calcification. Mild to moderate mitral valve regurgitation. Mild mitral valve stenosis. The mean mitral valve gradient is 5.5 mmHg with average heart rate of 83 bpm. Tricuspid Valve: The tricuspid valve is normal in structure. Tricuspid valve regurgitation is trivial. No evidence of tricuspid stenosis. Aortic Valve: The aortic valve is abnormal. There is moderate calcification of the aortic valve. Aortic valve regurgitation is trivial. No aortic stenosis is present. Pulmonic Valve: The pulmonic valve was not well visualized. Pulmonic valve regurgitation is trivial. No evidence of pulmonic stenosis. Aorta: The aortic root is normal in size and structure. Venous: The inferior vena  cava is normal in size with less than 50% respiratory variability, suggesting right atrial pressure of 8 mmHg. IAS/Shunts: Cannot exclude atrial level shunt by color flow Doppler, consider agitated saline exam.  LEFT VENTRICLE PLAX 2D LVIDd:         4.85 cm  Diastology LVIDs:         3.17 cm  LV e' medial:    7.62 cm/s LV PW:         0.74 cm  LV E/e' medial:  25.5 LV IVS:        0.75 cm  LV e' lateral:   8.49 cm/s LVOT diam:     2.00 cm  LV E/e' lateral: 22.9 LV SV:         90 LV SV Index:   50 LVOT Area:     3.14 cm  RIGHT VENTRICLE RV S prime:     9.90 cm/s TAPSE (M-mode): 2.8 cm LEFT ATRIUM             Index       RIGHT ATRIUM           Index LA diam:        4.60 cm 2.57 cm/m  RA Area:     17.90 cm LA Vol (A2C):   80.4 ml 44.86 ml/m RA Volume:   49.20 ml  27.45 ml/m LA Vol (A4C):   90.1 ml 50.27 ml/m LA Biplane Vol: 86.8 ml 48.43 ml/m  AORTIC VALVE LVOT Vmax:   120.00 cm/s LVOT Vmean:  86.100 cm/s LVOT VTI:    0.288 m  AORTA Ao Root diam: 3.00 cm MITRAL VALVE                TRICUSPID VALVE MV Area (PHT): 5.13 cm     TR Peak grad:   38.2 mmHg MV Mean grad:  5.5 mmHg     TR Vmax:        309.00 cm/s MV Decel Time: 148 msec MV E velocity: 194.00 cm/s  SHUNTS MV A velocity: 131.00 cm/s  Systemic VTI:  0.29 m MV E/A ratio:  1.48  Systemic Diam: 2.00 cm Cherlynn Kaiser MD Electronically signed by Cherlynn Kaiser MD Signature Date/Time: 10/21/2020/3:23:39 PM    Final    CT HEAD CODE STROKE WO CONTRAST  Result Date: 10/20/2020 CLINICAL DATA:  Code stroke.  Left-sided weakness and aphasia EXAM: CT HEAD WITHOUT CONTRAST TECHNIQUE: Contiguous axial images were obtained from the base of the skull through the vertex without intravenous contrast. COMPARISON:  None. FINDINGS: Brain: There is no mass, hemorrhage or extra-axial collection. The size and configuration of the ventricles and extra-axial CSF spaces are normal. There is hypoattenuation of the periventricular white matter, most commonly indicating  chronic ischemic microangiopathy. Vascular: No abnormal hyperdensity of the major intracranial arteries or dural venous sinuses. No intracranial atherosclerosis. Skull: The visualized skull base, calvarium and extracranial soft tissues are normal. Sinuses/Orbits: No fluid levels or advanced mucosal thickening of the visualized paranasal sinuses. No mastoid or middle ear effusion. The orbits are normal. ASPECTS [facility] Stroke Program Early CT Score) - Ganglionic level infarction (caudate, lentiform nuclei, internal capsule, insula, M1-M3 cortex): 7 - Supraganglionic infarction (M4-M6 cortex): 3 Total score (0-10 with 10 being normal): 10 IMPRESSION: 1. Chronic ischemic microangiopathy without acute intracranial abnormality. 2. ASPECTS is 10. These results were communicated to Dr. Amie Portland at 10:16 pm on 10/20/2020 by text page via the [facility] messaging system. Electronically Signed   By: Ulyses Jarred M.D.   On: 10/20/2020 22:16    ASSESSMENT & PLAN [name] 86 y.o. male with medical history significant for leukocytosis, thrombocytosis, and anemia presents for a follow up visit. His lab findings and bone marrow biopsy are consistent with MDS vs NOS MPN.   On exam today Mr. Fillinger reports that he is tolerating the hydroxyurea therapy well.  He is not having any stomach upset and has had a robust response with a drop in his white blood cell count, platelets, with no major decline in his hemoglobin.  Bone marrow biopsy performed on 11/26/2019 showed a hypercellular marrow consistent with MDS versus MPN with 9% blasts.  MDS panel was negative for definitive mutation at that time. Additional workup showed no JAK2, CALR, MPL, or BCR/ABL. He does carry 2 mutations  U2AF1 and SRSF2 which are of unclear clinical significance  We previously discussed that our working diagnosis at this time is MDS with some component of MPN.  We talked about treatments for MDS, however the patient noted that he would not  want any chemotherapy regimens.  I did briefly note that hypomethylation therapy is not technically chemotherapy, but he declined these IV treatments at this time. On further discussion we talked about the nature of his disease and the fact that he carries 2 nonclinical mutations but no targetable mutations.  We also discussed that this could be treated as an MDS with hypomethylating therapy, but he noted that that is not something that he would want to consider at this time.  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    Given his advanced age I would agree that supportive therapy with transfusions would not be an unreasonable option if this was consistent with his goals of care. We can also consider erythropoetin therapy if the frequency of blood transfusions increases to >2 per month, though with his other elevated blood findings that may not be advisable.   #Marked Thrombocytosis/Leukocytosis --patient admitted for aphasia and was noted to have a stroke. Fortunately he  has returned to baseline neurological function --started hydroxyurea 531m PO daily while in the hospital. Marked declines in WBC and platelets without major decline in Hgb --continue this therapy with weekly monitoring of CBC.   #Myelodysplastic Syndrome/ NOS Myeloproliferative Syndrome --bone marrow biopsy on 11/26/2019 showed hypercellular marrow with concern for MDS vs MPN. MDS panel negative for definitive mutation and canonical mutations such as JAK2, CALR, MPL, and BCR/ABL were negative. He does carry 2 mutations  U2AF1 and SRSF2 which are of unclear clinical significance --discussed the findings with the patient. He was agreeable to a comfort based approach where we focus on keeping his Hgb elevated to prevent symptoms. He did not wish to proceed with any hypomethylators/chemotherapy based treatments.  --no indication for blood transfusion based on today's  labs  All questions were answered. The patient knows to call the clinic with any problems, questions or concerns.  A total of more than 30 minutes were spent on this encounter and over half of that time was spent on counseling and coordination of care as outlined above.   JLedell Peoples MD Department of Hematology/Oncology [facility]at [facility] HospitalPhone: [phone]: [phone]: [email or web address]  10/29/2020 1:18 PM

## 2020-10-31 ENCOUNTER — Telehealth: Payer: Self-pay | Admitting: Hematology and Oncology

## 2020-10-31 NOTE — Telephone Encounter (Signed)
Scheduled per 11/24 los. Pt will receive an updated appt calendar per next visit appt notes

## 2020-11-03 ENCOUNTER — Telehealth: Payer: Self-pay | Admitting: Hematology and Oncology

## 2020-11-03 NOTE — Telephone Encounter (Signed)
R/s 12/22 appt per provider on call schedule. Called and left msg. Mailed printout

## 2020-11-05 ENCOUNTER — Other Ambulatory Visit: Payer: Self-pay

## 2020-11-05 ENCOUNTER — Inpatient Hospital Stay: Payer: Medicare Other | Attending: Hematology and Oncology

## 2020-11-05 DIAGNOSIS — J449 Chronic obstructive pulmonary disease, unspecified: Secondary | ICD-10-CM | POA: Insufficient documentation

## 2020-11-05 DIAGNOSIS — D75839 Thrombocytosis, unspecified: Secondary | ICD-10-CM | POA: Diagnosis not present

## 2020-11-05 DIAGNOSIS — D469 Myelodysplastic syndrome, unspecified: Secondary | ICD-10-CM | POA: Diagnosis not present

## 2020-11-05 DIAGNOSIS — Z79899 Other long term (current) drug therapy: Secondary | ICD-10-CM | POA: Diagnosis not present

## 2020-11-05 DIAGNOSIS — N189 Chronic kidney disease, unspecified: Secondary | ICD-10-CM | POA: Insufficient documentation

## 2020-11-05 DIAGNOSIS — I10 Essential (primary) hypertension: Secondary | ICD-10-CM | POA: Diagnosis not present

## 2020-11-05 DIAGNOSIS — I252 Old myocardial infarction: Secondary | ICD-10-CM | POA: Insufficient documentation

## 2020-11-05 DIAGNOSIS — Z7982 Long term (current) use of aspirin: Secondary | ICD-10-CM | POA: Diagnosis not present

## 2020-11-05 LAB — CBC WITH DIFFERENTIAL (CANCER CENTER ONLY)
Abs Immature Granulocytes: 3.43 10*3/uL — ABNORMAL HIGH (ref 0.00–0.07)
Basophils Absolute: 0.1 10*3/uL (ref 0.0–0.1)
Basophils Relative: 0 %
Eosinophils Absolute: 0.7 10*3/uL — ABNORMAL HIGH (ref 0.0–0.5)
Eosinophils Relative: 3 %
HCT: 26.4 % — ABNORMAL LOW (ref 39.0–52.0)
Hemoglobin: 8.6 g/dL — ABNORMAL LOW (ref 13.0–17.0)
Immature Granulocytes: 12 %
Lymphocytes Relative: 8 %
Lymphs Abs: 2.2 10*3/uL (ref 0.7–4.0)
MCH: 29.5 pg (ref 26.0–34.0)
MCHC: 32.6 g/dL (ref 30.0–36.0)
MCV: 90.4 fL (ref 80.0–100.0)
Monocytes Absolute: 1.1 10*3/uL — ABNORMAL HIGH (ref 0.1–1.0)
Monocytes Relative: 4 %
Neutro Abs: 21.1 10*3/uL — ABNORMAL HIGH (ref 1.7–7.7)
Neutrophils Relative %: 73 %
Platelet Count: 618 10*3/uL — ABNORMAL HIGH (ref 150–400)
RBC: 2.92 MIL/uL — ABNORMAL LOW (ref 4.22–5.81)
RDW: 22.3 % — ABNORMAL HIGH (ref 11.5–15.5)
WBC Count: 28.6 10*3/uL — ABNORMAL HIGH (ref 4.0–10.5)
nRBC: 0.2 % (ref 0.0–0.2)

## 2020-11-05 LAB — SAMPLE TO BLOOD BANK

## 2020-11-07 ENCOUNTER — Other Ambulatory Visit: Payer: Self-pay | Admitting: *Deleted

## 2020-11-07 ENCOUNTER — Telehealth: Payer: Self-pay | Admitting: *Deleted

## 2020-11-07 NOTE — Patient Outreach (Signed)
[facility]) Care Management  11/07/2020  Ioan Landini. 1925-06-12 [id number]   RED ON EMMI ALERT - Stroke Day # 13 Date: 12/1 Red Alert Reason: Did not go to follow up appointment   Outreach attempt #1, unsuccessful, HIPAA compliant voice message left.   Plan: RN CM will send unsuccessful outreach letter and follow up within the next 3-4 business days.  [name], [place], MSN [place] [phone]

## 2020-11-07 NOTE — Telephone Encounter (Signed)
Notified of message below

## 2020-11-07 NOTE — Telephone Encounter (Signed)
-----   Message from Orson Slick, MD sent at 11/07/2020  1:56 PM EST ----- Please let [name] know that his WBC and Plts continue trending downward as we had hoped. Hgb 8.6, Plt 618. Encourage him to continue hydroxyurea PO daily.  ----- Message ----- From: Buel Ream, Lab In [place] Sent: 11/05/2020  12:58 PM EST To: Orson Slick, MD

## 2020-11-10 ENCOUNTER — Telehealth: Payer: Self-pay | Admitting: *Deleted

## 2020-11-10 NOTE — Telephone Encounter (Signed)
Received call from patient requesting to change his lab appt to another day  This week. Originally scheduled on 11/12/20.  Requesting 11/13/20.  New lab appt made and pt made aware.

## 2020-11-12 ENCOUNTER — Other Ambulatory Visit: Payer: Self-pay | Admitting: *Deleted

## 2020-11-12 ENCOUNTER — Inpatient Hospital Stay: Payer: Medicare Other

## 2020-11-12 NOTE — Patient Outreach (Signed)
[facility]) Care Management  11/12/2020  Patrick Rodriguez. [birth year] [id number]    RED ON EMMI ALERT - Stroke Day # 13 Date: 12/1 Red Alert Reason: Did not go to follow up appointment  Outreach attempt #2, successful.  Call placed to member to follow up on office visits and to offer [facility] services again.  He report he has spoken to PCP office however PCP is unable to see him until the first of the year.  They offered him a chance to see another provider in the office, he declined, only want to see his assigned physician.  Discussed recommendation for follow up with neurologist, again state he does not see the need, will not schedule.  He has pre-op appointment tomorrow, scheduled for endarterectomy on 12/15.  Does not feel the need for ongoing services at this time but agrees to follow up after surgery to re-assess needs.   Plan: RN CM will follow up with member upon discharge from surgery.  [name], [place], MSN [place] [phone]

## 2020-11-13 ENCOUNTER — Inpatient Hospital Stay: Payer: Medicare Other

## 2020-11-13 ENCOUNTER — Other Ambulatory Visit: Payer: Self-pay | Admitting: Hematology and Oncology

## 2020-11-13 ENCOUNTER — Encounter: Payer: Self-pay | Admitting: *Deleted

## 2020-11-13 ENCOUNTER — Telehealth: Payer: Self-pay | Admitting: *Deleted

## 2020-11-13 ENCOUNTER — Encounter (HOSPITAL_COMMUNITY)
Admission: RE | Admit: 2020-11-13 | Discharge: 2020-11-13 | Disposition: A | Discharge status: Home / Self Care | Payer: Medicare Other | Source: Ambulatory Visit | Attending: Vascular Surgery | Admitting: Vascular Surgery

## 2020-11-13 ENCOUNTER — Encounter (HOSPITAL_COMMUNITY): Payer: Self-pay

## 2020-11-13 ENCOUNTER — Other Ambulatory Visit: Payer: Self-pay

## 2020-11-13 DIAGNOSIS — Z01812 Encounter for preprocedural laboratory examination: Secondary | ICD-10-CM | POA: Diagnosis not present

## 2020-11-13 DIAGNOSIS — I252 Old myocardial infarction: Secondary | ICD-10-CM | POA: Diagnosis not present

## 2020-11-13 DIAGNOSIS — D469 Myelodysplastic syndrome, unspecified: Secondary | ICD-10-CM | POA: Diagnosis not present

## 2020-11-13 DIAGNOSIS — N189 Chronic kidney disease, unspecified: Secondary | ICD-10-CM | POA: Diagnosis not present

## 2020-11-13 DIAGNOSIS — I10 Essential (primary) hypertension: Secondary | ICD-10-CM | POA: Diagnosis not present

## 2020-11-13 DIAGNOSIS — D75839 Thrombocytosis, unspecified: Secondary | ICD-10-CM | POA: Diagnosis not present

## 2020-11-13 DIAGNOSIS — J449 Chronic obstructive pulmonary disease, unspecified: Secondary | ICD-10-CM | POA: Diagnosis not present

## 2020-11-13 DIAGNOSIS — Z7982 Long term (current) use of aspirin: Secondary | ICD-10-CM | POA: Diagnosis not present

## 2020-11-13 DIAGNOSIS — Z79899 Other long term (current) drug therapy: Secondary | ICD-10-CM | POA: Diagnosis not present

## 2020-11-13 HISTORY — DX: Chronic obstructive pulmonary disease, unspecified: J44.9

## 2020-11-13 HISTORY — DX: Pneumonia, unspecified organism: J18.9

## 2020-11-13 HISTORY — DX: Chronic kidney disease, unspecified: N18.9

## 2020-11-13 HISTORY — DX: Myelodysplastic syndrome, unspecified: D46.9

## 2020-11-13 HISTORY — DX: Personal history of urinary calculi: Z87.442

## 2020-11-13 HISTORY — DX: Unilateral inguinal hernia, without obstruction or gangrene, not specified as recurrent: K40.90

## 2020-11-13 LAB — URINALYSIS, ROUTINE W REFLEX MICROSCOPIC
Bacteria, UA: NONE SEEN
Bilirubin Urine: NEGATIVE
Glucose, UA: NEGATIVE mg/dL
Hgb urine dipstick: NEGATIVE
Ketones, ur: NEGATIVE mg/dL
Leukocytes,Ua: NEGATIVE
Nitrite: NEGATIVE
Protein, ur: 100 mg/dL — AB
Specific Gravity, Urine: 1.017 (ref 1.005–1.030)
pH: 5 (ref 5.0–8.0)

## 2020-11-13 LAB — CBC WITH DIFFERENTIAL (CANCER CENTER ONLY)
Abs Immature Granulocytes: 1.42 10*3/uL — ABNORMAL HIGH (ref 0.00–0.07)
Basophils Absolute: 0.1 10*3/uL (ref 0.0–0.1)
Basophils Relative: 1 %
Eosinophils Absolute: 0.5 10*3/uL (ref 0.0–0.5)
Eosinophils Relative: 3 %
HCT: 25.9 % — ABNORMAL LOW (ref 39.0–52.0)
Hemoglobin: 8.4 g/dL — ABNORMAL LOW (ref 13.0–17.0)
Immature Granulocytes: 8 %
Lymphocytes Relative: 7 %
Lymphs Abs: 1.3 10*3/uL (ref 0.7–4.0)
MCH: 29.6 pg (ref 26.0–34.0)
MCHC: 32.4 g/dL (ref 30.0–36.0)
MCV: 91.2 fL (ref 80.0–100.0)
Monocytes Absolute: 0.4 10*3/uL (ref 0.1–1.0)
Monocytes Relative: 2 %
Neutro Abs: 14.5 10*3/uL — ABNORMAL HIGH (ref 1.7–7.7)
Neutrophils Relative %: 79 %
Platelet Count: 260 10*3/uL (ref 150–400)
RBC: 2.84 MIL/uL — ABNORMAL LOW (ref 4.22–5.81)
RDW: 22.9 % — ABNORMAL HIGH (ref 11.5–15.5)
WBC Count: 18.3 10*3/uL — ABNORMAL HIGH (ref 4.0–10.5)
nRBC: 0 % (ref 0.0–0.2)

## 2020-11-13 LAB — APTT: aPTT: 33 seconds (ref 24–36)

## 2020-11-13 LAB — CMP (CANCER CENTER ONLY)
ALT: 12 U/L (ref 0–44)
AST: 18 U/L (ref 15–41)
Albumin: 3.5 g/dL (ref 3.5–5.0)
Alkaline Phosphatase: 49 U/L (ref 38–126)
Anion gap: 9 (ref 5–15)
BUN: 26 mg/dL — ABNORMAL HIGH (ref 8–23)
CO2: 22 mmol/L (ref 22–32)
Calcium: 9.2 mg/dL (ref 8.9–10.3)
Chloride: 108 mmol/L (ref 98–111)
Creatinine: 1.4 mg/dL — ABNORMAL HIGH (ref 0.61–1.24)
GFR, Estimated: 47 mL/min — ABNORMAL LOW (ref >60)
Glucose, Bld: 101 mg/dL — ABNORMAL HIGH (ref 70–99)
Potassium: 4.7 mmol/L (ref 3.5–5.1)
Sodium: 139 mmol/L (ref 135–145)
Total Bilirubin: 0.5 mg/dL (ref 0.3–1.2)
Total Protein: 6.5 g/dL (ref 6.5–8.1)

## 2020-11-13 LAB — BLOOD GAS, ARTERIAL
Acid-base deficit: 1.5 mmol/L (ref 0.0–2.0)
Bicarbonate: 22 mmol/L (ref 20.0–28.0)
Drawn by: 58793
FIO2: 21
O2 Saturation: 98.4 %
Patient temperature: 37
pCO2 arterial: 32.1 mmHg (ref 32.0–48.0)
pH, Arterial: 7.45 (ref 7.350–7.450)
pO2, Arterial: 101 mmHg (ref 83.0–108.0)

## 2020-11-13 LAB — PROTIME-INR
INR: 1.2 (ref 0.8–1.2)
Prothrombin Time: 14.6 seconds (ref 11.4–15.2)

## 2020-11-13 LAB — SAMPLE TO BLOOD BANK

## 2020-11-13 LAB — SURGICAL PCR SCREEN
MRSA, PCR: NEGATIVE
Staphylococcus aureus: NEGATIVE

## 2020-11-13 NOTE — Progress Notes (Addendum)
PCP - Dr. Maury Dus Cardiologist - Dr. Peter Martinique  PPM/ICD - denies  Chest x-ray - N/A EKG - 10/22/2020 Stress Test - 2015 ECHO - 10/21/2020 Cardiac Cath - 2000??  Sleep Study - denies CPAP - N/A  DM: denies  Blood Thinner Instructions: N/A Aspirin Instructions: continue taking as prescribed  ERAS Protcol - No  COVID TEST- Scheduled for 11/18/2020. Patient verbalized understanding of self-quarantine instructions, appointment time and place.   Anesthesia review: YES, hx of CAD, abnormal labs  Patient denies shortness of breath, fever, cough and chest pain at PAT appointment  All instructions explained to the patient, with a verbal understanding of the material. Patient agrees to go over the instructions while at home for a better understanding. Patient also instructed to self quarantine after being tested for COVID-19. The opportunity to ask questions was provided.

## 2020-11-13 NOTE — Progress Notes (Signed)
Your procedure is scheduled on Wednesday, December 15th.  Report to [facility] Main Entrance "A" at 9:00 A.M., and check in at the Admitting office.  Call this number if you have problems the morning of surgery:  [phone]  Call [phone] if you have any questions prior to your surgery date Monday-Friday 8am-4pm   Remember:  Do not eat or drink after midnight the night before your surgery    Take these medicines the morning of surgery with A SIP OF WATER  amLODipine (NORVASC)  atorvastatin (LIPITOR) omeprazole (PRILOSEC)  Follow your surgeon's instructions on when to stop Aspirin.  If no instructions were given by your surgeon then you will need to call the office to get those instructions.    As of today, STOP taking  Aleve, Naproxen, Ibuprofen, Motrin, Advil, Goody's, BC's, all herbal medications, fish oil, and all vitamins.                     Do not wear jewelry.            Do not wear lotions, powders, colognes, or deodorant.            Men may shave face and neck.            Do not bring valuables to the hospital.            [facility] is not responsible for any belongings or valuables.  Do NOT Smoke (Tobacco/Vaping) or drink Alcohol 24 hours prior to your procedure If you use a CPAP at night, you may bring all equipment for your overnight stay.   Contacts, glasses, dentures or bridgework may not be worn into surgery.      For patients admitted to the hospital, discharge time will be determined by your treatment team.   Patients discharged the day of surgery will not be allowed to drive home, and someone needs to stay with them for 24 hours  Special instructions:   [place]- Preparing For Surgery  Before surgery, you can play an important role. Because skin is not sterile, your skin needs to be as free of germs as possible. You can reduce the number of germs on your skin by washing with CHG (chlorahexidine gluconate) Soap before surgery.  CHG is an antiseptic  cleaner which kills germs and bonds with the skin to continue killing germs even after washing.    Oral Hygiene is also important to reduce your risk of infection.  Remember - BRUSH YOUR TEETH THE MORNING OF SURGERY WITH YOUR REGULAR TOOTHPASTE  Please do not use if you have an allergy to CHG or antibacterial soaps. If your skin becomes reddened/irritated stop using the CHG.  Do not shave (including legs and underarms) for at least 48 hours prior to first CHG shower. It is OK to shave your face.  Please follow these instructions carefully.   1. Shower the NIGHT BEFORE SURGERY and the MORNING OF SURGERY with CHG Soap.   2. If you chose to wash your hair, wash your hair first as usual with your normal shampoo.  3. After you shampoo, rinse your hair and body thoroughly to remove the shampoo.  4. Use CHG as you would any other liquid soap. You can apply CHG directly to the skin and wash gently with a scrungie or a clean washcloth.   5. Apply the CHG Soap to your body ONLY FROM THE NECK DOWN.  Do not use on open wounds or open sores.  Avoid contact with your eyes, ears, mouth and genitals (private parts). Wash Face and genitals (private parts)  with your normal soap.   6. Wash thoroughly, paying special attention to the area where your surgery will be performed.  7. Thoroughly rinse your body with warm water from the neck down.  8. DO NOT shower/wash with your normal soap after using and rinsing off the CHG Soap.  9. Pat yourself dry with a CLEAN TOWEL.  10. Wear CLEAN PAJAMAS to bed the night before surgery  11. Place CLEAN SHEETS on your bed the night of your first shower and DO NOT SLEEP WITH PETS.  Day of Surgery: Wear Clean/Comfortable clothing the morning of surgery Do not apply any deodorants/lotions.   Remember to brush your teeth WITH YOUR REGULAR TOOTHPASTE.   Please read over the following fact sheets that you were given.

## 2020-11-13 NOTE — Telephone Encounter (Signed)
[name] patient regarding today's lab results.  Advised that his HGB is 8.4 and platelets are now down to 260. Pt voiced understanding. He is currently doing his pre-op testing for carotid artery surgery next week

## 2020-11-14 ENCOUNTER — Encounter (HOSPITAL_COMMUNITY): Payer: Self-pay

## 2020-11-14 ENCOUNTER — Telehealth: Payer: Self-pay | Admitting: *Deleted

## 2020-11-14 NOTE — Telephone Encounter (Signed)
TC from patient inquiring about his Hydroxyurea medication prior to and after his upcoming surgery.  Spoke with Dr. Lorenso Courier and he advised pt to take last does on 11/16/20 and then have lab check on 11/26/20 to see platelet count. Dosage of hydroxyurea will be determined after labs that day. Explained the above to patient. He voiced understanding.  Scheduling message sent for lab appt on 11/26/20

## 2020-11-14 NOTE — Progress Notes (Addendum)
Anesthesia Chart Review:  Case: [id number] Date/Time: 11/19/20 1044   Procedure: LEFT CAROTID ENDARTERECTOMY (Left )   Anesthesia type: General   Pre-op diagnosis: Symptomatic Carotid Artery Stenosis   Location: [facility] OR ROOM 11 / [facility] OR   Surgeons: Rosetta Posner, MD      DISCUSSION: Patient is a 86 year old male scheduled for the above procedure. He presented to [facility] ED 10/20/20 with sudden onset of confusion, left-sided weakness and expressive aphasia. Symptoms improved in ED. He was hypertensive. Initial head CT was negative for CVA. CTA neck showed 27% LICA and 74% RICA stenosis. MRI could not be performed until 10/21/20 due to desaturations when lying flat which improved with IV Lasix and short term supplement O2. MRI confirmed small scattered left MCA infarcts. Echo showed normal LVEF with normal wall motion and grade II diastolic dysfunction, RVSP 46 mmHg, mild-moderate MR, mild MS. Neurology and Vascular surgery consulted. It was felt he had symptomatic LICA stenosis. HEM-ONC also consulted for abnormal labs in setting of MDS. His markedly high platelets (2016K) and WBC (81K) thought to have also played a role in his CVA. Hydroxyurea was started with plans for out-patient left carotid endarterectomy once his leukocytosis and thrombocytosis shows improvement. Out-patient neurology follow-up planned after surgery. ASA and statin recommended. (Of note, patient had scheduled follow-up with cardiologist Dr. Martinique on 10/22/20 for increased palpitations, but patient called to let Dr. Martinique know he was in the hospital for CVA. He was in SR by 10/20/20 EKG. Last visit April 2021.)   Other history includes former smoker (quit 12/06/68), COPD, CAD (MI, s/p LAD stent 2000), HTN, HLD, palpitations, carotid artery stenosis, CVA (10/20/20), syncope (due to dehydration, 2013), GERD, skin cancer (BCC, SCC), myelodysplastic syndrome (with anemia, leukocytosis, thrombocytosis), substance abuse (previously  alcohol abuse and smoking, quit ~ 1970), CKD.  11/13/20 follow-up labs with Dr. Lorenso Courier showed stable Creatinine 1.40. His WBC had further decreased to 18.3K and PLT count had normalized at 260K. His H/H were 8.4/25.9, previously 8.6/26.4 on 11/05/20. Although H/H are stable, I sent a staff message to Dr. Lorenso Courier and Dr. Donnetta Hutching inquiring if any additional recommendations for anemia on or before his surgery date.  He is scheduled for a T&S on arrival. Presurgical COVID-19 testing is scheduled for 11/18/20.    ADDENDUM 11/14/20 4:51 PM: Dr.  Lorenso Courier responded that although Mr. Mucci reached his PLT goal, the downward trajectory was rather rapid. He is going to ask him to hold hydroxyurea prior to surgery starting this weekend and continue to hold until until further instructed by HEM-ONC. He requested that we recheck his platelet count before surgery to ensure it had not dropped too low. Dr. Donnetta Hutching responded that he would like patient to have a T&S, as well as a T&C. A STAT CBC, T&S, and PRBC 2 units (to have available based on lab results) have been ordered for arrival the day of surgery. Dr. Lorenso Courier also recommended "CBC be checked promptly in the post operative period with PRBC transfusion for Hgb <8.0." His pager number is [phone] if any questions.     VS: BP (!) 174/57   Pulse 78   Temp 36.8 C (Oral)   Resp 18   Ht 5\' 7"  (1.702 m)   Wt 68.3 kg   SpO2 100%   BMI 23.57 kg/m    PROVIDERS: Maury Dus, MD is PCP  - Martinique, Peter, MD is cardiologist. Last visit 03/11/20. Toprol added for HTN (avoiding ACEi/ARB due to CKD). Six  month follow-up planned. (Had been scheduled for 10/22/20, but cancelled due to CVA admission.) Christinia Gully, MD is pulmonologist. Last visit 09/03/20 for COPD Gold I follow-up. Sheran Spine, MD is HEM-ONC Rosalin Hawking, MD is neurologist (seen 10/2020 CVA admission)   LABS: 11/13/20 labs reviewed. See DISCUSSION.   Labs Reviewed  URINALYSIS, ROUTINE W REFLEX  MICROSCOPIC - Abnormal; Notable for the following components:      Result Value   APPearance HAZY (*)    Protein, ur 100 (*)    All other components within normal limits  SURGICAL PCR SCREEN  PROTIME-INR  APTT  BLOOD GAS, ARTERIAL   Labs from 11/13/20 [facility]) included: Lab Results  Component Value Date   WBC 18.3 (H) 11/13/2020   HGB 8.4 (L) 11/13/2020   HCT 25.9 (L) 11/13/2020   PLT 260 11/13/2020   GLUCOSE 101 (H) 11/13/2020   ALT 12 11/13/2020   AST 18 11/13/2020   NA 139 11/13/2020   K 4.7 11/13/2020   CL 108 11/13/2020   CREATININE 1.40 (H) 11/13/2020   BUN 26 (H) 11/13/2020   CO2 22 11/13/2020   INR 1.2 11/13/2020   HGBA1C 6.1 (H) 10/21/2020    PFTs > 5 years ago.   IMAGES: MRI Brain 10/21/20: IMPRESSION: 1. Scattered small acute cortical infarcts in the posterior left MCA territory. 2. Age congruent senescent changes. 3. Lower than expected marrow signal, please correlate with CBC.  CTA head/neck 10/20/20: IMPRESSION: 1. No intracranial arterial occlusion or high-grade stenosis of the intracranial arteries. 2. Bilateral proximal internal carotid artery stenoses, measuring 70 % on the right and 90 % on the left. 3. Small pleural effusions and multiple upper mediastinal lymph nodes. 4. Contrast bolus was deemed insufficient by the RAPID software. Manual post processing of the data will be attempted and, if successful, an addendum will be issued. - Aortic Atherosclerosis (ICD10-I70.0).  1V CXR 10/20/20: FINDINGS: Cardiomegaly. Bilateral interstitial and airspace opacities, likely edema. No effusions. No acute bony abnormality. IMPRESSION: Bilateral interstitial and airspace opacities, most likely edema/CHF.   EKG: 10/20/20:  V-rate 99 bpm Sinus tachycardia  Atrial premature complex Borderline right axis deviation Nonspecific repol abnormality, diffuse leads Confirmed by Ripley Fraise [phone]) on 10/20/2020 11:42:26 PM   CV: Echo  10/21/20: IMPRESSIONS  1. Left ventricular ejection fraction, by estimation, is 55 to 60%. The  left ventricle has normal function. The left ventricle has no regional  wall motion abnormalities. Left ventricular diastolic parameters are  consistent with Grade II diastolic  dysfunction (pseudonormalization).  2. Right ventricular systolic function is normal. The right ventricular  size is normal. There is moderately elevated pulmonary artery systolic  pressure. The estimated right ventricular systolic pressure is 76.2 mmHg. 3. Left atrial size was severely dilated.  4. Right atrial size was mildly dilated.  5. The mitral valve is normal in structure. Mild to moderate mitral valve  regurgitation. Mild mitral stenosis. The mean mitral valve gradient is 5.5  mmHg with average heart rate of 83 bpm. Moderate mitral annular  calcification.  6. The aortic valve is abnormal. There is moderate calcification of the  aortic valve. Aortic valve regurgitation is trivial. No aortic stenosis is  present.  7. The inferior vena cava is normal in size with <50% respiratory  variability, suggesting right atrial pressure of 8 mmHg.  8. Cannot exclude atrial level shunt by color flow Doppler, consider  agitated saline exam.    Nuclear stress test 12/11/13: Overall Impression:  Normal stress  nuclear study. This is a low-risk scan. There is no scar or ischemia. There is no change in the study from the report of the study of 2008. The patient did have PVCs that were scattered throughout the study. LV Ejection Fraction: 70%.  LV Wall Motion:  Normal Wall Motion.   24 Hour Holter monitor 11/08/13: Normal sinus rhythm/sinus bradycardia with first degree AV block.  Frequent PVCs.  Blocked PACs.  No significant arrhythmia.   Past Medical History:  Diagnosis Date  . Allergic rhinitis   . Basal cell carcinoma 01/07/2020   left preauricular  . Basal cell carcinoma 06/25/2020   right tip of nose  .  Carotid artery stenosis    12/2011:  50-69% on the left and 1-49% on the right;  Carotid US (11/2013): Bilateral 40-59%; repeat 1 year  . Cataract   . CKD (chronic kidney disease)   . COPD (chronic obstructive pulmonary disease) ([place])   . Coronary artery disease    a.  s/p stenting of the LAD in 2000;  b. Lexiscan Myoview (12/2013): No ischemia, EF 70%; normal study  . ED (erectile dysfunction)   . GERD (gastroesophageal reflux disease)   . History of kidney stones    Per patient "had one or two over the years"  . Hyperlipidemia   . Hyperplastic colon polyp 11/15/2013   x3  . Hypertension   . Inguinal hernia    per patient on the right side  . Myelodysplastic syndrome ([place])   . Myocardial infarction ([place])   . Normal echocardiogram Jan 2013   EF of 55% and no wall motion abnormalities  . Normal nuclear stress test 2008  . Obesity   . Palpitations    Holter (11/2013): NSR, sinus brady, PVCs, rare blocked PAC, no sig arrhythmia  . Pneumonia   . Squamous cell carcinoma of skin 01/07/2020   right lat elbow  . Stroke ([place]) 10/20/2020  . Substance abuse ([place])   . Syncope Jan 2013   felt to be vasovagal; had normal echo, carotids ok, 1st degree AV block with mention (but no tracing) of 2nd degree type I AV block while in [place]  . Tubular adenoma 11/15/2013   x2    Past Surgical History:  Procedure Laterality Date  . APPENDECTOMY    . CARDIAC CATHETERIZATION  01/20/1999   EF 60%  . CARDIOVASCULAR STRESS TEST  04/19/2007   EF 74%  . CATARACT EXTRACTION W/ INTRAOCULAR LENS  IMPLANT, BILATERAL    . CORONARY STENT PLACEMENT  2000   LAD  . DOPPLER ECHOCARDIOGRAPHY  02/06/1998   EF 60%  . TONSILLECTOMY      MEDICATIONS: . amLODipine (NORVASC) 10 MG tablet  . Ascorbic Acid (VITAMIN C) 1000 MG tablet  . aspirin 81 MG tablet  . atorvastatin (LIPITOR) 40 MG tablet  . b complex vitamins tablet  . Dupilumab (DUPIXENT) 300 MG/2ML SOPN  . hydroxyurea (HYDREA) 500 MG capsule  . magnesium  gluconate (MAGONATE) 500 MG tablet  . mometasone-formoterol (DULERA) 100-5 MCG/ACT AERO  . Nattokinase 100 MG CAPS  . omeprazole (PRILOSEC) 40 MG capsule  . TURMERIC PO  . VITAMIN E PO  . zinc gluconate 50 MG tablet   No current facility-administered medications for this encounter.    Myra Gianotti, PA-C Surgical Short Stay/Anesthesiology [facility] Phone [phone] [facility] Phone [phone] 11/14/2020 2:48 PM

## 2020-11-14 NOTE — Anesthesia Preprocedure Evaluation (Addendum)
Anesthesia Evaluation  Patient identified by MRN, date of birth, ID band Patient awake    Reviewed: Allergy & Precautions, NPO status , Patient's Chart, lab work & pertinent test results  Airway Mallampati: II  TM Distance: >3 FB Neck ROM: Limited    Dental  (+) Teeth Intact, Dental Advisory Given   Pulmonary former smoker,    breath sounds clear to auscultation       Cardiovascular hypertension,  Rhythm:Regular Rate:Normal     Neuro/Psych    GI/Hepatic   Endo/Other    Renal/GU      Musculoskeletal   Abdominal   Peds  Hematology   Anesthesia Other Findings   Reproductive/Obstetrics                            Anesthesia Physical Anesthesia Plan  ASA: III  Anesthesia Plan: General   Post-op Pain Management:    Induction: Intravenous  PONV Risk Score and Plan: Ondansetron and Dexamethasone  Airway Management Planned: Oral ETT  Additional Equipment: Arterial line  Intra-op Plan:   Post-operative Plan: Extubation in OR  Informed Consent: I have reviewed the patients History and Physical, chart, labs and discussed the procedure including the risks, benefits and alternatives for the proposed anesthesia with the patient or authorized representative who has indicated his/her understanding and acceptance.     Dental advisory given  Plan Discussed with: CRNA and Anesthesiologist  Anesthesia Plan Comments: (See PAT note written 11/14/2020 by Myra Gianotti, PA-C.  HEM-ONC Dr. Lorenso Courier recommended recheck platelets prior to surgery (CBC ordered) and "would recommend his CBC be checked promptly in the post operative period with PRBC transfusion for Hgb <8.0." If any questions in the perioperative period, Dr. Lorenso Courier can be reached at: Ledell Peoples, MD  Department of Hematology/Oncology  [place] [facility]  Phone: [phone]  Pager: [phone] )        Anesthesia Quick Evaluation

## 2020-11-18 ENCOUNTER — Telehealth: Payer: Self-pay | Admitting: Vascular Surgery

## 2020-11-18 ENCOUNTER — Other Ambulatory Visit (HOSPITAL_COMMUNITY)
Admission: RE | Admit: 2020-11-18 | Discharge: 2020-11-18 | Disposition: A | Discharge status: Home / Self Care | Payer: Medicare Other | Source: Ambulatory Visit | Attending: Vascular Surgery | Admitting: Vascular Surgery

## 2020-11-18 DIAGNOSIS — K219 Gastro-esophageal reflux disease without esophagitis: Secondary | ICD-10-CM | POA: Diagnosis not present

## 2020-11-18 DIAGNOSIS — R531 Weakness: Secondary | ICD-10-CM | POA: Diagnosis not present

## 2020-11-18 DIAGNOSIS — I441 Atrioventricular block, second degree: Secondary | ICD-10-CM | POA: Diagnosis not present

## 2020-11-18 DIAGNOSIS — I129 Hypertensive chronic kidney disease with stage 1 through stage 4 chronic kidney disease, or unspecified chronic kidney disease: Secondary | ICD-10-CM | POA: Diagnosis not present

## 2020-11-18 DIAGNOSIS — Z01812 Encounter for preprocedural laboratory examination: Secondary | ICD-10-CM | POA: Insufficient documentation

## 2020-11-18 DIAGNOSIS — D469 Myelodysplastic syndrome, unspecified: Secondary | ICD-10-CM | POA: Diagnosis not present

## 2020-11-18 DIAGNOSIS — I130 Hypertensive heart and chronic kidney disease with heart failure and stage 1 through stage 4 chronic kidney disease, or unspecified chronic kidney disease: Secondary | ICD-10-CM | POA: Diagnosis not present

## 2020-11-18 DIAGNOSIS — I6522 Occlusion and stenosis of left carotid artery: Secondary | ICD-10-CM | POA: Diagnosis not present

## 2020-11-18 DIAGNOSIS — I97638 Postprocedural hematoma of a circulatory system organ or structure following other circulatory system procedure: Secondary | ICD-10-CM | POA: Diagnosis not present

## 2020-11-18 DIAGNOSIS — Z9861 Coronary angioplasty status: Secondary | ICD-10-CM | POA: Diagnosis not present

## 2020-11-18 DIAGNOSIS — I5030 Unspecified diastolic (congestive) heart failure: Secondary | ICD-10-CM | POA: Diagnosis not present

## 2020-11-18 DIAGNOSIS — Z87891 Personal history of nicotine dependence: Secondary | ICD-10-CM | POA: Diagnosis not present

## 2020-11-18 DIAGNOSIS — E669 Obesity, unspecified: Secondary | ICD-10-CM | POA: Diagnosis present

## 2020-11-18 DIAGNOSIS — D691 Qualitative platelet defects: Secondary | ICD-10-CM | POA: Diagnosis not present

## 2020-11-18 DIAGNOSIS — D631 Anemia in chronic kidney disease: Secondary | ICD-10-CM | POA: Diagnosis not present

## 2020-11-18 DIAGNOSIS — J449 Chronic obstructive pulmonary disease, unspecified: Secondary | ICD-10-CM | POA: Diagnosis not present

## 2020-11-18 DIAGNOSIS — Y839 Surgical procedure, unspecified as the cause of abnormal reaction of the patient, or of later complication, without mention of misadventure at the time of the procedure: Secondary | ICD-10-CM | POA: Diagnosis not present

## 2020-11-18 DIAGNOSIS — Z85828 Personal history of other malignant neoplasm of skin: Secondary | ICD-10-CM | POA: Diagnosis not present

## 2020-11-18 DIAGNOSIS — S1093XA Contusion of unspecified part of neck, initial encounter: Secondary | ICD-10-CM | POA: Diagnosis not present

## 2020-11-18 DIAGNOSIS — I2510 Atherosclerotic heart disease of native coronary artery without angina pectoris: Secondary | ICD-10-CM | POA: Diagnosis not present

## 2020-11-18 DIAGNOSIS — R4701 Aphasia: Secondary | ICD-10-CM | POA: Diagnosis not present

## 2020-11-18 DIAGNOSIS — I639 Cerebral infarction, unspecified: Secondary | ICD-10-CM | POA: Diagnosis not present

## 2020-11-18 DIAGNOSIS — Z800 Family history of malignant neoplasm of digestive organs: Secondary | ICD-10-CM | POA: Diagnosis not present

## 2020-11-18 DIAGNOSIS — I252 Old myocardial infarction: Secondary | ICD-10-CM | POA: Diagnosis not present

## 2020-11-18 DIAGNOSIS — L7632 Postprocedural hematoma of skin and subcutaneous tissue following other procedure: Secondary | ICD-10-CM | POA: Diagnosis not present

## 2020-11-18 DIAGNOSIS — E785 Hyperlipidemia, unspecified: Secondary | ICD-10-CM | POA: Diagnosis not present

## 2020-11-18 DIAGNOSIS — Z8719 Personal history of other diseases of the digestive system: Secondary | ICD-10-CM | POA: Diagnosis not present

## 2020-11-18 DIAGNOSIS — Z20822 Contact with and (suspected) exposure to covid-19: Secondary | ICD-10-CM | POA: Insufficient documentation

## 2020-11-18 DIAGNOSIS — N183 Chronic kidney disease, stage 3 unspecified: Secondary | ICD-10-CM | POA: Diagnosis not present

## 2020-11-18 LAB — SARS CORONAVIRUS 2 (TAT 6-24 HRS): SARS Coronavirus 2: NEGATIVE

## 2020-11-18 NOTE — Telephone Encounter (Addendum)
Pt called with c/o L sided jaw/tooth pain. He is scheduled for carotid surgery in the morning. I have advised him to see his dentist today to determine the cause. Pt has made an appt for this afternoon with his dentist and will call us back after that appt. Pt called after his dental appt and confirmed he does not have any kind of infection and is all set for surgery tomorrow morning.

## 2020-11-19 ENCOUNTER — Inpatient Hospital Stay: Payer: Medicare Other

## 2020-11-19 ENCOUNTER — Other Ambulatory Visit: Payer: Self-pay

## 2020-11-19 ENCOUNTER — Encounter (HOSPITAL_COMMUNITY)
Admission: RE | Disposition: A | Discharge status: Home / Self Care | Payer: Self-pay | Source: Ambulatory Visit | Attending: Vascular Surgery

## 2020-11-19 ENCOUNTER — Inpatient Hospital Stay (HOSPITAL_COMMUNITY): Payer: Medicare Other | Admitting: Vascular Surgery

## 2020-11-19 ENCOUNTER — Inpatient Hospital Stay (HOSPITAL_COMMUNITY): Payer: Medicare Other | Admitting: Certified Registered Nurse Anesthetist

## 2020-11-19 ENCOUNTER — Inpatient Hospital Stay (HOSPITAL_COMMUNITY)
Admission: RE | Admit: 2020-11-19 | Discharge: 2020-11-21 | DRG: 038 | Disposition: A | Discharge status: Home / Self Care | Payer: Medicare Other | Source: Ambulatory Visit | Attending: Vascular Surgery | Admitting: Vascular Surgery

## 2020-11-19 ENCOUNTER — Encounter (HOSPITAL_COMMUNITY): Payer: Self-pay | Admitting: Vascular Surgery

## 2020-11-19 DIAGNOSIS — E785 Hyperlipidemia, unspecified: Secondary | ICD-10-CM | POA: Diagnosis present

## 2020-11-19 DIAGNOSIS — I639 Cerebral infarction, unspecified: Secondary | ICD-10-CM | POA: Diagnosis not present

## 2020-11-19 DIAGNOSIS — Z800 Family history of malignant neoplasm of digestive organs: Secondary | ICD-10-CM | POA: Diagnosis not present

## 2020-11-19 DIAGNOSIS — E669 Obesity, unspecified: Secondary | ICD-10-CM | POA: Diagnosis present

## 2020-11-19 DIAGNOSIS — D691 Qualitative platelet defects: Secondary | ICD-10-CM | POA: Diagnosis present

## 2020-11-19 DIAGNOSIS — Z87891 Personal history of nicotine dependence: Secondary | ICD-10-CM

## 2020-11-19 DIAGNOSIS — S1093XA Contusion of unspecified part of neck, initial encounter: Secondary | ICD-10-CM | POA: Diagnosis not present

## 2020-11-19 DIAGNOSIS — N183 Chronic kidney disease, stage 3 unspecified: Secondary | ICD-10-CM | POA: Diagnosis present

## 2020-11-19 DIAGNOSIS — D469 Myelodysplastic syndrome, unspecified: Secondary | ICD-10-CM | POA: Diagnosis present

## 2020-11-19 DIAGNOSIS — I2510 Atherosclerotic heart disease of native coronary artery without angina pectoris: Secondary | ICD-10-CM | POA: Diagnosis present

## 2020-11-19 DIAGNOSIS — L7632 Postprocedural hematoma of skin and subcutaneous tissue following other procedure: Secondary | ICD-10-CM | POA: Diagnosis not present

## 2020-11-19 DIAGNOSIS — J449 Chronic obstructive pulmonary disease, unspecified: Secondary | ICD-10-CM | POA: Diagnosis present

## 2020-11-19 DIAGNOSIS — R531 Weakness: Secondary | ICD-10-CM | POA: Diagnosis present

## 2020-11-19 DIAGNOSIS — I63232 Cerebral infarction due to unspecified occlusion or stenosis of left carotid arteries: Secondary | ICD-10-CM | POA: Diagnosis present

## 2020-11-19 DIAGNOSIS — Z9861 Coronary angioplasty status: Secondary | ICD-10-CM | POA: Diagnosis not present

## 2020-11-19 DIAGNOSIS — I252 Old myocardial infarction: Secondary | ICD-10-CM | POA: Diagnosis not present

## 2020-11-19 DIAGNOSIS — Z85828 Personal history of other malignant neoplasm of skin: Secondary | ICD-10-CM | POA: Diagnosis not present

## 2020-11-19 DIAGNOSIS — Z8719 Personal history of other diseases of the digestive system: Secondary | ICD-10-CM | POA: Diagnosis not present

## 2020-11-19 DIAGNOSIS — I130 Hypertensive heart and chronic kidney disease with heart failure and stage 1 through stage 4 chronic kidney disease, or unspecified chronic kidney disease: Secondary | ICD-10-CM | POA: Diagnosis not present

## 2020-11-19 DIAGNOSIS — Z20822 Contact with and (suspected) exposure to covid-19: Secondary | ICD-10-CM | POA: Diagnosis present

## 2020-11-19 DIAGNOSIS — Y839 Surgical procedure, unspecified as the cause of abnormal reaction of the patient, or of later complication, without mention of misadventure at the time of the procedure: Secondary | ICD-10-CM | POA: Diagnosis not present

## 2020-11-19 DIAGNOSIS — I97638 Postprocedural hematoma of a circulatory system organ or structure following other circulatory system procedure: Secondary | ICD-10-CM | POA: Diagnosis not present

## 2020-11-19 DIAGNOSIS — R4701 Aphasia: Secondary | ICD-10-CM | POA: Diagnosis present

## 2020-11-19 DIAGNOSIS — I5030 Unspecified diastolic (congestive) heart failure: Secondary | ICD-10-CM | POA: Diagnosis not present

## 2020-11-19 DIAGNOSIS — K219 Gastro-esophageal reflux disease without esophagitis: Secondary | ICD-10-CM | POA: Diagnosis present

## 2020-11-19 DIAGNOSIS — I129 Hypertensive chronic kidney disease with stage 1 through stage 4 chronic kidney disease, or unspecified chronic kidney disease: Secondary | ICD-10-CM | POA: Diagnosis present

## 2020-11-19 DIAGNOSIS — I6522 Occlusion and stenosis of left carotid artery: Secondary | ICD-10-CM | POA: Diagnosis not present

## 2020-11-19 DIAGNOSIS — I441 Atrioventricular block, second degree: Secondary | ICD-10-CM | POA: Diagnosis present

## 2020-11-19 HISTORY — PX: ENDARTERECTOMY: SHX5162

## 2020-11-19 HISTORY — PX: PATCH ANGIOPLASTY: SHX6230

## 2020-11-19 HISTORY — PX: OTHER SURGICAL HISTORY: SHX169

## 2020-11-19 HISTORY — PX: HEMATOMA EVACUATION: SHX5118

## 2020-11-19 LAB — POCT I-STAT 7, (LYTES, BLD GAS, ICA,H+H)
Acid-Base Excess: 0 mmol/L (ref 0.0–2.0)
Bicarbonate: 26.3 mmol/L (ref 20.0–28.0)
Calcium, Ion: 1.24 mmol/L (ref 1.15–1.40)
HCT: 23 % — ABNORMAL LOW (ref 39.0–52.0)
Hemoglobin: 7.8 g/dL — ABNORMAL LOW (ref 13.0–17.0)
O2 Saturation: 100 %
Patient temperature: 37.1
Potassium: 4.2 mmol/L (ref 3.5–5.1)
Sodium: 136 mmol/L (ref 135–145)
TCO2: 28 mmol/L (ref 22–32)
pCO2 arterial: 50.2 mmHg — ABNORMAL HIGH (ref 32.0–48.0)
pH, Arterial: 7.328 — ABNORMAL LOW (ref 7.350–7.450)
pO2, Arterial: 194 mmHg — ABNORMAL HIGH (ref 83.0–108.0)

## 2020-11-19 LAB — CREATININE, SERUM
Creatinine, Ser: 1.58 mg/dL — ABNORMAL HIGH (ref 0.61–1.24)
GFR, Estimated: 41 mL/min — ABNORMAL LOW (ref >60)

## 2020-11-19 LAB — PREPARE RBC (CROSSMATCH)

## 2020-11-19 LAB — CBC
HCT: 27.2 % — ABNORMAL LOW (ref 39.0–52.0)
HCT: 27.7 % — ABNORMAL LOW (ref 39.0–52.0)
Hemoglobin: 8.8 g/dL — ABNORMAL LOW (ref 13.0–17.0)
Hemoglobin: 9.2 g/dL — ABNORMAL LOW (ref 13.0–17.0)
MCH: 30 pg (ref 26.0–34.0)
MCH: 30.3 pg (ref 26.0–34.0)
MCHC: 32.4 g/dL (ref 30.0–36.0)
MCHC: 33.2 g/dL (ref 30.0–36.0)
MCV: 92.8 fL (ref 80.0–100.0)
MCV: 91.1 fL (ref 80.0–100.0)
Platelets: 454 10*3/uL — ABNORMAL HIGH (ref 150–400)
Platelets: 355 10*3/uL (ref 150–400)
RBC: 2.93 MIL/uL — ABNORMAL LOW (ref 4.22–5.81)
RBC: 3.04 MIL/uL — ABNORMAL LOW (ref 4.22–5.81)
RDW: 23.3 % — ABNORMAL HIGH (ref 11.5–15.5)
RDW: 20.4 % — ABNORMAL HIGH (ref 11.5–15.5)
WBC: 17.6 10*3/uL — ABNORMAL HIGH (ref 4.0–10.5)
WBC: 34.9 10*3/uL — ABNORMAL HIGH (ref 4.0–10.5)
nRBC: 0 % (ref 0.0–0.2)
nRBC: 0 % (ref 0.0–0.2)

## 2020-11-19 SURGERY — ENDARTERECTOMY, CAROTID
Anesthesia: General | Site: Neck | Laterality: Left

## 2020-11-19 SURGERY — EVACUATION HEMATOMA
Anesthesia: General | Site: Neck | Laterality: Left

## 2020-11-19 MED ORDER — SODIUM CHLORIDE 0.9 % IV SOLN
INTRAVENOUS | Status: AC
  Filled 2020-11-19: qty 1.2

## 2020-11-19 MED ORDER — FENTANYL CITRATE (PF) 100 MCG/2ML IJ SOLN
25.0000 ug | INTRAMUSCULAR | Status: DC | PRN
  Administered 2020-11-19 (×4): 25 ug via INTRAVENOUS
  Administered 2020-11-19: 50 ug via INTRAVENOUS

## 2020-11-19 MED ORDER — SUGAMMADEX SODIUM 200 MG/2ML IV SOLN
INTRAVENOUS | Status: DC | PRN
  Administered 2020-11-19: 150 mg via INTRAVENOUS

## 2020-11-19 MED ORDER — ACETAMINOPHEN 325 MG PO TABS
325.0000 mg | ORAL_TABLET | ORAL | Status: DC | PRN
  Administered 2020-11-20 – 2020-11-21 (×5): 650 mg via ORAL
  Filled 2020-11-19 (×5): qty 2

## 2020-11-19 MED ORDER — PHENYLEPHRINE 40 MCG/ML (10ML) SYRINGE FOR IV PUSH (FOR BLOOD PRESSURE SUPPORT)
PREFILLED_SYRINGE | INTRAVENOUS | Status: DC | PRN
  Administered 2020-11-19: 40 ug via INTRAVENOUS

## 2020-11-19 MED ORDER — FENTANYL CITRATE (PF) 100 MCG/2ML IJ SOLN: 25.0000 ug | INTRAMUSCULAR | Status: DC | PRN

## 2020-11-19 MED ORDER — PHENYLEPHRINE 40 MCG/ML (10ML) SYRINGE FOR IV PUSH (FOR BLOOD PRESSURE SUPPORT)
PREFILLED_SYRINGE | INTRAVENOUS | Status: AC
  Filled 2020-11-19: qty 10

## 2020-11-19 MED ORDER — CEFAZOLIN SODIUM-DEXTROSE 2-4 GM/100ML-% IV SOLN
2.0000 g | Freq: Three times a day (TID) | INTRAVENOUS | Status: AC
  Administered 2020-11-19 – 2020-11-20 (×2): 2 g via INTRAVENOUS
  Filled 2020-11-19 (×2): qty 100

## 2020-11-19 MED ORDER — FENTANYL CITRATE (PF) 250 MCG/5ML IJ SOLN
INTRAMUSCULAR | Status: AC
  Filled 2020-11-19: qty 5

## 2020-11-19 MED ORDER — CHLORHEXIDINE GLUCONATE CLOTH 2 % EX PADS: 6.0000 | MEDICATED_PAD | Freq: Once | CUTANEOUS | Status: DC

## 2020-11-19 MED ORDER — CHLORHEXIDINE GLUCONATE 0.12 % MT SOLN: 15.0000 mL | Freq: Once | OROMUCOSAL | Status: AC

## 2020-11-19 MED ORDER — HEMOSTATIC AGENTS (NO CHARGE) OPTIME
TOPICAL | Status: DC | PRN
  Administered 2020-11-19: 1 via TOPICAL

## 2020-11-19 MED ORDER — PROPOFOL 10 MG/ML IV BOLUS
INTRAVENOUS | Status: DC | PRN
  Administered 2020-11-19: 100 mg via INTRAVENOUS

## 2020-11-19 MED ORDER — HYDROXYUREA 500 MG PO CAPS
500.0000 mg | ORAL_CAPSULE | Freq: Every day | ORAL | Status: DC
  Filled 2020-11-19 (×2): qty 1

## 2020-11-19 MED ORDER — SUGAMMADEX SODIUM 200 MG/2ML IV SOLN
INTRAVENOUS | Status: DC | PRN
  Administered 2020-11-19: 120 mg via INTRAVENOUS

## 2020-11-19 MED ORDER — HEPARIN SODIUM (PORCINE) 5000 UNIT/ML IJ SOLN
5000.0000 [IU] | Freq: Three times a day (TID) | INTRAMUSCULAR | Status: DC
  Administered 2020-11-20 – 2020-11-21 (×4): 5000 [IU] via SUBCUTANEOUS
  Filled 2020-11-19 (×4): qty 1

## 2020-11-19 MED ORDER — DOCUSATE SODIUM 100 MG PO CAPS
100.0000 mg | ORAL_CAPSULE | Freq: Every day | ORAL | Status: DC
  Administered 2020-11-20: 09:00:00 100 mg via ORAL
  Filled 2020-11-19 (×2): qty 1

## 2020-11-19 MED ORDER — ATORVASTATIN CALCIUM 40 MG PO TABS
40.0000 mg | ORAL_TABLET | Freq: Every day | ORAL | Status: DC
  Administered 2020-11-20 – 2020-11-21 (×2): 40 mg via ORAL
  Filled 2020-11-19 (×2): qty 1

## 2020-11-19 MED ORDER — MAGNESIUM GLUCONATE 500 MG PO TABS
500.0000 mg | ORAL_TABLET | Freq: Every day | ORAL | Status: DC
  Administered 2020-11-20 – 2020-11-21 (×2): 500 mg via ORAL
  Filled 2020-11-19 (×2): qty 1

## 2020-11-19 MED ORDER — SODIUM CHLORIDE 0.9 % IV SOLN: INTRAVENOUS | Status: DC

## 2020-11-19 MED ORDER — LIDOCAINE HCL (PF) 1 % IJ SOLN
INTRAMUSCULAR | Status: AC
  Filled 2020-11-19: qty 30

## 2020-11-19 MED ORDER — HEPARIN SODIUM (PORCINE) 1000 UNIT/ML IJ SOLN
INTRAMUSCULAR | Status: DC | PRN
  Administered 2020-11-19: 7000 [IU] via INTRAVENOUS

## 2020-11-19 MED ORDER — GUAIFENESIN-DM 100-10 MG/5ML PO SYRP: 15.0000 mL | ORAL_SOLUTION | ORAL | Status: DC | PRN

## 2020-11-19 MED ORDER — NATTOKINASE 100 MG PO CAPS: 200.0000 mg | ORAL_CAPSULE | Freq: Every day | ORAL | Status: DC

## 2020-11-19 MED ORDER — LIDOCAINE 2% (20 MG/ML) 5 ML SYRINGE
INTRAMUSCULAR | Status: DC | PRN
  Administered 2020-11-19: 60 mg via INTRAVENOUS

## 2020-11-19 MED ORDER — FENTANYL CITRATE (PF) 100 MCG/2ML IJ SOLN
INTRAMUSCULAR | Status: AC
  Filled 2020-11-19: qty 2

## 2020-11-19 MED ORDER — ONDANSETRON HCL 4 MG/2ML IJ SOLN: 4.0000 mg | Freq: Once | INTRAMUSCULAR | Status: DC | PRN

## 2020-11-19 MED ORDER — HYDRALAZINE HCL 20 MG/ML IJ SOLN: 5.0000 mg | INTRAMUSCULAR | Status: DC | PRN

## 2020-11-19 MED ORDER — DEXAMETHASONE SODIUM PHOSPHATE 10 MG/ML IJ SOLN: INTRAMUSCULAR | Status: DC | PRN

## 2020-11-19 MED ORDER — HYDROMORPHONE HCL 1 MG/ML IJ SOLN
0.2500 mg | INTRAMUSCULAR | Status: DC | PRN
  Administered 2020-11-19 (×2): 0.25 mg via INTRAVENOUS
  Administered 2020-11-19: 0.5 mg via INTRAVENOUS
  Administered 2020-11-19: 0.25 mg via INTRAVENOUS

## 2020-11-19 MED ORDER — 0.9 % SODIUM CHLORIDE (POUR BTL) OPTIME
TOPICAL | Status: DC | PRN
  Administered 2020-11-19 (×2): 1000 mL

## 2020-11-19 MED ORDER — SUCCINYLCHOLINE CHLORIDE 20 MG/ML IJ SOLN
INTRAMUSCULAR | Status: DC | PRN
  Administered 2020-11-19: 100 mg via INTRAVENOUS

## 2020-11-19 MED ORDER — LACTATED RINGERS IV SOLN: INTRAVENOUS | Status: DC

## 2020-11-19 MED ORDER — PANTOPRAZOLE SODIUM 40 MG PO TBEC
80.0000 mg | DELAYED_RELEASE_TABLET | Freq: Every day | ORAL | Status: DC
  Administered 2020-11-20 – 2020-11-21 (×2): 80 mg via ORAL
  Filled 2020-11-19 (×2): qty 2

## 2020-11-19 MED ORDER — HYDROMORPHONE HCL 1 MG/ML IJ SOLN
INTRAMUSCULAR | Status: AC
  Filled 2020-11-19: qty 1

## 2020-11-19 MED ORDER — MAGNESIUM SULFATE 2 GM/50ML IV SOLN: 2.0000 g | Freq: Every day | INTRAVENOUS | Status: DC | PRN

## 2020-11-19 MED ORDER — HYDROMORPHONE HCL 1 MG/ML IJ SOLN: 0.5000 mg | INTRAMUSCULAR | Status: DC | PRN

## 2020-11-19 MED ORDER — PHENYLEPHRINE HCL-NACL 10-0.9 MG/250ML-% IV SOLN
INTRAVENOUS | Status: DC | PRN
  Administered 2020-11-19: 25 ug/min via INTRAVENOUS

## 2020-11-19 MED ORDER — PROTAMINE SULFATE 10 MG/ML IV SOLN
INTRAVENOUS | Status: DC | PRN
  Administered 2020-11-19 (×2): 50 mg via INTRAVENOUS

## 2020-11-19 MED ORDER — OXYCODONE HCL 5 MG PO TABS: 5.0000 mg | ORAL_TABLET | Freq: Once | ORAL | Status: DC | PRN

## 2020-11-19 MED ORDER — SODIUM CHLORIDE 0.9 % IV SOLN
INTRAVENOUS | Status: DC | PRN
  Administered 2020-11-19: 19:00:00 500 mL

## 2020-11-19 MED ORDER — METOPROLOL TARTRATE 5 MG/5ML IV SOLN: 2.0000 mg | INTRAVENOUS | Status: DC | PRN

## 2020-11-19 MED ORDER — AMLODIPINE BESYLATE 10 MG PO TABS
10.0000 mg | ORAL_TABLET | Freq: Every day | ORAL | Status: DC
  Administered 2020-11-20 – 2020-11-21 (×2): 10 mg via ORAL
  Filled 2020-11-19 (×2): qty 1

## 2020-11-19 MED ORDER — CHLORHEXIDINE GLUCONATE 0.12 % MT SOLN
OROMUCOSAL | Status: AC
  Administered 2020-11-19: 10:00:00 15 mL via OROMUCOSAL
  Filled 2020-11-19: qty 15

## 2020-11-19 MED ORDER — ASPIRIN EC 81 MG PO TBEC
81.0000 mg | DELAYED_RELEASE_TABLET | Freq: Every day | ORAL | Status: DC
  Administered 2020-11-20 – 2020-11-21 (×2): 81 mg via ORAL
  Filled 2020-11-19 (×2): qty 1

## 2020-11-19 MED ORDER — PROPOFOL 10 MG/ML IV BOLUS
INTRAVENOUS | Status: DC | PRN
  Administered 2020-11-19: 30 mg via INTRAVENOUS
  Administered 2020-11-19 (×2): 20 mg via INTRAVENOUS
  Administered 2020-11-19: 130 mg via INTRAVENOUS

## 2020-11-19 MED ORDER — PROPOFOL 10 MG/ML IV BOLUS
INTRAVENOUS | Status: AC
  Filled 2020-11-19: qty 20

## 2020-11-19 MED ORDER — LABETALOL HCL 5 MG/ML IV SOLN: 10.0000 mg | INTRAVENOUS | Status: DC | PRN

## 2020-11-19 MED ORDER — ONDANSETRON HCL 4 MG/2ML IJ SOLN
INTRAMUSCULAR | Status: AC
  Filled 2020-11-19: qty 2

## 2020-11-19 MED ORDER — ONDANSETRON HCL 4 MG/2ML IJ SOLN
INTRAMUSCULAR | Status: DC | PRN
  Administered 2020-11-19: 4 mg via INTRAVENOUS

## 2020-11-19 MED ORDER — PHENOL 1.4 % MT LIQD: 1.0000 | OROMUCOSAL | Status: DC | PRN

## 2020-11-19 MED ORDER — ROCURONIUM BROMIDE 10 MG/ML (PF) SYRINGE
PREFILLED_SYRINGE | INTRAVENOUS | Status: DC | PRN
  Administered 2020-11-19: 40 mg via INTRAVENOUS

## 2020-11-19 MED ORDER — SODIUM CHLORIDE 0.9 % IV SOLN: 500.0000 mL | Freq: Once | INTRAVENOUS | Status: DC | PRN

## 2020-11-19 MED ORDER — ONDANSETRON HCL 4 MG/2ML IJ SOLN: 4.0000 mg | Freq: Four times a day (QID) | INTRAMUSCULAR | Status: DC | PRN

## 2020-11-19 MED ORDER — OXYCODONE HCL 5 MG/5ML PO SOLN: 5.0000 mg | Freq: Once | ORAL | Status: DC | PRN

## 2020-11-19 MED ORDER — ROCURONIUM BROMIDE 10 MG/ML (PF) SYRINGE
PREFILLED_SYRINGE | INTRAVENOUS | Status: DC | PRN
  Administered 2020-11-19 (×2): 20 mg via INTRAVENOUS
  Administered 2020-11-19: 60 mg via INTRAVENOUS

## 2020-11-19 MED ORDER — OXYCODONE-ACETAMINOPHEN 5-325 MG PO TABS
1.0000 | ORAL_TABLET | ORAL | Status: DC | PRN
  Administered 2020-11-21: 1 via ORAL
  Filled 2020-11-19 (×2): qty 1

## 2020-11-19 MED ORDER — SODIUM CHLORIDE 0.9 % IV SOLN: INTRAVENOUS | Status: DC | PRN

## 2020-11-19 MED ORDER — ALUM & MAG HYDROXIDE-SIMETH 200-200-20 MG/5ML PO SUSP
15.0000 mL | ORAL | Status: DC | PRN
Start: 2020-11-19 — End: 2020-11-21

## 2020-11-19 MED ORDER — ORAL CARE MOUTH RINSE: 15.0000 mL | Freq: Once | OROMUCOSAL | Status: AC

## 2020-11-19 MED ORDER — CEFAZOLIN SODIUM-DEXTROSE 2-4 GM/100ML-% IV SOLN
2.0000 g | INTRAVENOUS | Status: AC
  Administered 2020-11-19: 13:00:00 2 g via INTRAVENOUS
  Filled 2020-11-19: qty 100

## 2020-11-19 MED ORDER — CEFAZOLIN SODIUM-DEXTROSE 2-3 GM-%(50ML) IV SOLR
INTRAVENOUS | Status: DC | PRN
  Administered 2020-11-19: 2 g via INTRAVENOUS

## 2020-11-19 MED ORDER — ACETAMINOPHEN 650 MG RE SUPP
325.0000 mg | RECTAL | Status: DC | PRN
Start: 2020-11-19 — End: 2020-11-21

## 2020-11-19 MED ORDER — POTASSIUM CHLORIDE CRYS ER 20 MEQ PO TBCR: 20.0000 meq | EXTENDED_RELEASE_TABLET | Freq: Every day | ORAL | Status: DC | PRN

## 2020-11-19 MED ORDER — SODIUM CHLORIDE 0.9 % IV SOLN
INTRAVENOUS | Status: DC | PRN
  Administered 2020-11-19: 14:00:00 500 mL

## 2020-11-19 MED ORDER — FENTANYL CITRATE (PF) 100 MCG/2ML IJ SOLN
INTRAMUSCULAR | Status: DC | PRN
  Administered 2020-11-19 (×2): 50 ug via INTRAVENOUS

## 2020-11-19 MED ORDER — PROTAMINE SULFATE 10 MG/ML IV SOLN
INTRAVENOUS | Status: AC
  Filled 2020-11-19: qty 5

## 2020-11-19 MED ORDER — HEPARIN SODIUM (PORCINE) 1000 UNIT/ML IJ SOLN
INTRAMUSCULAR | Status: AC
  Filled 2020-11-19: qty 1

## 2020-11-19 SURGICAL SUPPLY — 36 items
ADH SKN CLS APL DERMABOND .7 (GAUZE/BANDAGES/DRESSINGS) ×2
CANISTER SUCT 3000ML PPV (MISCELLANEOUS) ×3 IMPLANT
CATH ROBINSON RED A/P 18FR (CATHETERS) ×2 IMPLANT
CLIP LIGATING EXTRA MED SLVR (CLIP) ×2 IMPLANT
CLIP LIGATING EXTRA SM BLUE (MISCELLANEOUS) ×2 IMPLANT
CUFF TOURN SGL QUICK 34 (TOURNIQUET CUFF)
CUFF TRNQT CYL 34X4.125X (TOURNIQUET CUFF) IMPLANT
DERMABOND ADVANCED (GAUZE/BANDAGES/DRESSINGS) ×4
DERMABOND ADVANCED .7 DNX12 (GAUZE/BANDAGES/DRESSINGS) IMPLANT
DRAIN CHANNEL 19F RND (DRAIN) ×2 IMPLANT
DRSG TEGADERM 4X4.5 CHG (GAUZE/BANDAGES/DRESSINGS) ×4 IMPLANT
ELECT REM PT RETURN 9FT ADLT (ELECTROSURGICAL) ×3
ELECTRODE REM PT RTRN 9FT ADLT (ELECTROSURGICAL) ×1 IMPLANT
EVACUATOR SILICONE 100CC (DRAIN) ×2 IMPLANT
GAUZE SPONGE 2X2 8PLY STRL LF (GAUZE/BANDAGES/DRESSINGS) IMPLANT
GLOVE SS BIOGEL STRL SZ 7.5 (GLOVE) ×1 IMPLANT
GLOVE SUPERSENSE BIOGEL SZ 7.5 (GLOVE) ×2
GOWN STRL REUS W/ TWL LRG LVL3 (GOWN DISPOSABLE) ×3 IMPLANT
GOWN STRL REUS W/TWL LRG LVL3 (GOWN DISPOSABLE) ×9
KIT BASIN OR (CUSTOM PROCEDURE TRAY) ×3 IMPLANT
KIT TURNOVER KIT B (KITS) ×3 IMPLANT
PACK CV ACCESS (CUSTOM PROCEDURE TRAY) IMPLANT
PACK GENERAL/GYN (CUSTOM PROCEDURE TRAY) IMPLANT
PACK PERIPHERAL VASCULAR (CUSTOM PROCEDURE TRAY) ×2 IMPLANT
PACK UNIVERSAL I (CUSTOM PROCEDURE TRAY) IMPLANT
SPONGE GAUZE 2X2 STER 10/PKG (GAUZE/BANDAGES/DRESSINGS) ×2
SUT ETHILON 3 0 PS 1 (SUTURE) ×2 IMPLANT
SUT PROLENE 6 0 CC (SUTURE) ×2 IMPLANT
SUT VIC AB 2-0 CTX 36 (SUTURE) IMPLANT
SUT VIC AB 3-0 SH 27 (SUTURE) ×6
SUT VIC AB 3-0 SH 27X BRD (SUTURE) IMPLANT
SUT VICRYL 4-0 PS2 18IN ABS (SUTURE) ×2 IMPLANT
TOWEL GREEN STERILE (TOWEL DISPOSABLE) ×3 IMPLANT
TRAY FOLEY MTR SLVR 16FR STAT (SET/KITS/TRAYS/PACK) IMPLANT
TUBING EXTENTION W/L.L. (IV SETS) IMPLANT
UNDERPAD 30X36 HEAVY ABSORB (UNDERPADS AND DIAPERS) ×3 IMPLANT

## 2020-11-19 SURGICAL SUPPLY — 45 items
ADH SKN CLS APL DERMABOND .7 (GAUZE/BANDAGES/DRESSINGS) ×1
BIOPATCH RED 1 DISK 7.0 (GAUZE/BANDAGES/DRESSINGS) ×1 IMPLANT
BIOPATCH RED 1IN DISK 7.0MM (GAUZE/BANDAGES/DRESSINGS) ×1
CANISTER SUCT 3000ML PPV (MISCELLANEOUS) ×3 IMPLANT
CANNULA VESSEL 3MM 2 BLNT TIP (CANNULA) ×6 IMPLANT
CATH ROBINSON RED A/P 18FR (CATHETERS) ×3 IMPLANT
CLIP LIGATING EXTRA MED SLVR (CLIP) ×3 IMPLANT
CLIP LIGATING EXTRA SM BLUE (MISCELLANEOUS) ×3 IMPLANT
COVER WAND RF STERILE (DRAPES) ×1 IMPLANT
DECANTER SPIKE VIAL GLASS SM (MISCELLANEOUS) IMPLANT
DERMABOND ADVANCED (GAUZE/BANDAGES/DRESSINGS) ×2
DERMABOND ADVANCED .7 DNX12 (GAUZE/BANDAGES/DRESSINGS) ×1 IMPLANT
DRAIN HEMOVAC 1/8 X 5 (WOUND CARE) IMPLANT
ELECT REM PT RETURN 9FT ADLT (ELECTROSURGICAL) ×3
ELECTRODE REM PT RTRN 9FT ADLT (ELECTROSURGICAL) ×1 IMPLANT
EVACUATOR SILICONE 100CC (DRAIN) ×2 IMPLANT
GAUZE SPONGE 2X2 8PLY STRL LF (GAUZE/BANDAGES/DRESSINGS) IMPLANT
GLOVE SS BIOGEL STRL SZ 7.5 (GLOVE) ×1 IMPLANT
GLOVE SUPERSENSE BIOGEL SZ 7.5 (GLOVE) ×2
GOWN STRL REUS W/ TWL LRG LVL3 (GOWN DISPOSABLE) ×3 IMPLANT
GOWN STRL REUS W/TWL LRG LVL3 (GOWN DISPOSABLE) ×9
HEMOSTAT SNOW SURGICEL 2X4 (HEMOSTASIS) ×2 IMPLANT
KIT BASIN OR (CUSTOM PROCEDURE TRAY) ×3 IMPLANT
KIT SHUNT ARGYLE CAROTID ART 6 (VASCULAR PRODUCTS) IMPLANT
KIT TURNOVER KIT B (KITS) ×3 IMPLANT
NEEDLE 22X1 1/2 (OR ONLY) (NEEDLE) IMPLANT
NS IRRIG 1000ML POUR BTL (IV SOLUTION) ×6 IMPLANT
PACK CAROTID (CUSTOM PROCEDURE TRAY) ×3 IMPLANT
PAD ARMBOARD 7.5X6 YLW CONV (MISCELLANEOUS) ×6 IMPLANT
PATCH HEMASHIELD 8X75 (Vascular Products) ×2 IMPLANT
POSITIONER HEAD DONUT 9IN (MISCELLANEOUS) ×3 IMPLANT
SHUNT CAROTID BYPASS 10 (VASCULAR PRODUCTS) ×2 IMPLANT
SHUNT CAROTID BYPASS 12FRX15.5 (VASCULAR PRODUCTS) IMPLANT
SPONGE GAUZE 2X2 STER 10/PKG (GAUZE/BANDAGES/DRESSINGS) ×2
SUT ETHILON 3 0 PS 1 (SUTURE) ×2 IMPLANT
SUT PROLENE 6 0 CC (SUTURE) ×11 IMPLANT
SUT SILK 3 0 (SUTURE)
SUT SILK 3-0 18XBRD TIE 12 (SUTURE) IMPLANT
SUT VIC AB 3-0 SH 27 (SUTURE) ×9
SUT VIC AB 3-0 SH 27X BRD (SUTURE) ×2 IMPLANT
SUT VICRYL 4-0 PS2 18IN ABS (SUTURE) ×3 IMPLANT
SYR CONTROL 10ML LL (SYRINGE) IMPLANT
TAPE CLOTH SURG 4X10 WHT LF (GAUZE/BANDAGES/DRESSINGS) ×2 IMPLANT
TOWEL GREEN STERILE (TOWEL DISPOSABLE) ×3 IMPLANT
WATER STERILE IRR 1000ML POUR (IV SOLUTION) ×3 IMPLANT

## 2020-11-19 NOTE — Transfer of Care (Signed)
Immediate Anesthesia Transfer of Care Note  Patient: Patrick Rodriguez.  Procedure(s) Performed: LEFT CAROTID ENDARTERECTOMY (Left Neck) PATCH ANGIOPLASTY LEFT CAROTID (Left Neck)  Patient Location: PACU  Anesthesia Type:General  Level of Consciousness: awake, alert  and oriented  Airway & Oxygen Therapy: Patient Spontanous Breathing  Post-op Assessment: Report given to RN, Patient moving all extremities X 4 and Patient able to stick tongue midline  Post vital signs: Reviewed and stable  Last Vitals:  Vitals Value Taken Time  BP 131/48 11/19/20 1649  Temp    Pulse 65 11/19/20 1649  Resp 15 11/19/20 1649  SpO2 96 % 11/19/20 1649  Vitals shown include unvalidated device data.  Last Pain:  Vitals:   11/19/20 0931  TempSrc:   PainSc: 0-No pain      Patients Stated Pain Goal: 4 (28/41/32 4401)  Complications: No complications documented.

## 2020-11-19 NOTE — Discharge Instructions (Signed)
   [facility]  Discharge Instructions   Carotid Surgery  Please refer to the following instructions for your post-procedure care. Your surgeon or physician assistant will discuss any changes with you.  Activity  You are encouraged to walk as much as you can. You can slowly return to normal activities but must avoid strenuous activity and heavy lifting until your doctor tell you it's okay. Avoid activities such as vacuuming or swinging a golf club. You can drive after one week if you are comfortable and you are no longer taking prescription pain medications. It is normal to feel tired for serval weeks after your surgery. It is also normal to have difficulty with sleep habits, eating, and bowel movements after surgery. These will go away with time.  Bathing/Showering  Shower daily after you go home. Do not soak in a bathtub, hot tub, or swim until the incision heals completely.  Incision Care  Shower every day. Clean your incision with mild soap and water. Pat the area dry with a clean towel. You do not need a bandage unless otherwise instructed. Do not apply any ointments or creams to your incision. You may have skin glue on your incision. Do not peel it off. It will come off on its own in about one week. Your incision may feel thickened and raised for several weeks after your surgery. This is normal and the skin will soften over time.   For Men Only: It's okay to shave around the incision but do not shave the incision itself for 2 weeks. It is common to have numbness under your chin that could last for several months.  Diet  Resume your normal diet. There are no special food restrictions following this procedure. A low fat/low cholesterol diet is recommended for all patients with vascular disease. In order to heal from your surgery, it is CRITICAL to get adequate nutrition. Your body requires vitamins, minerals, and protein. Vegetables are the best source of  vitamins and minerals. Vegetables also provide the perfect balance of protein. Processed food has little nutritional value, so try to avoid this.  Medications  Resume taking all of your medications unless your doctor or physician assistant tells you not to. If your incision is causing pain, you may take over-the- counter pain relievers such as acetaminophen (Tylenol). If you were prescribed a stronger pain medication, please be aware these medications can cause nausea and constipation. Prevent nausea by taking the medication with a snack or meal. Avoid constipation by drinking plenty of fluids and eating foods with a high amount of fiber, such as fruits, vegetables, and grains.   Do not take Tylenol if you are taking prescription pain medications.  Follow Up  Our office will schedule a follow up appointment 2-3 weeks following discharge.  Please call us immediately for any of the following conditions  . Increased pain, redness, drainage (pus) from your incision site. . Fever of 101 degrees or higher. . If you should develop stroke (slurred speech, difficulty swallowing, weakness on one side of your body, loss of vision) you should call 911 and go to the nearest emergency room. .  Reduce your risk of vascular disease:  . Stop smoking. If you would like help call [facility] at 1-800-QUIT-NOW (1-800-784-8669) or [facility] at 336-586-4000. . Manage your cholesterol . Maintain a desired weight . Control your diabetes . Keep your blood pressure down .  If you have any questions, please call the office at [phone]. 

## 2020-11-19 NOTE — Op Note (Signed)
OPERATIVE REPORT  DATE OF SURGERY: 11/19/2020  PATIENT: Patrick Cruise., 86 y.o. male MRN: [id number]  DOB: 20-Sep-1925  PRE-OPERATIVE DIAGNOSIS: Left Carotid Stenosis, Symptomatic  POST-OPERATIVE DIAGNOSIS:  Same  PROCEDURE:  Left Carotid Endarterectomy with Dacron Patch Angioplasty  SURGEON:  Curt Jews, M.D.  PHYSICIAN ASSISTANT: Baglia PA C  The assistant was needed for exposure and to expedite the case  ANESTHESIA:   general  EBL: Less than 200 ml  Total I/O In: 1630 [I.V.:1000; Blood:630] Out: 400 [Blood:400]  BLOOD ADMINISTERED: none  DRAINS: Fluted JP drain  SPECIMEN: none  COUNTS CORRECT:  YES  PLAN OF CARE: Admit to inpatient   PATIENT DISPOSITION:  PACU - hemodynamically stable and neurologically intact.  PROCEDURE DETAILS: The patient was taken to the operating room placed in supine position.  General anesthesia was administered.  The neck was prepped and draped in the usual sterile fashion.  An incision was made anterior to the sternocleidomastoid and carried down through the platysma with electrocautery.  The sternocleidomastoid was reflected posteriorly and the carotid sheath was opened.  The facial vein was ligated with 2-0 silk ties and divided.  The common carotid artery was encircled with an umbilical tape and Rummel tourniquet.  The vagus nerve was identified and preserved.  Dissection was continued onto the carotid bifurcation.  The superior thyroid artery was encircled with a 2-0 silk Potts tie.  The external carotid was encircled with a blue vessel loop and the internal carotid was encircled with an umbilical tape and Rummel tourniquet.  The hypoglossal nerve was identified and preserved.  The patient was given systemic heparin and after adequate circulation time, the internal, external and common carotid arteries were occluded with vascular clamps.  The common carotid artery was opened with an 11 blade and extended  longitudinally with Potts scissors.   A 10 shunt was passed up the internal carotid and allowed to backbleed.  It was then passed down the common carotid where it was secured with Rummel tourniquet.  The endarterectomy was begun on the common carotid artery and the plaque was divided proximally with Potts scissors.  The endarterectomy was continued onto the bifurcation.  The external carotid was endarterectomized with an eversion technique and the internal carotid was endarterectomized in an open fashion.  Remaining atheromatous debris was removed from the endarterectomy plane.  A Finesse Hemashield Dacron patch was brought onto the field and was sewn as a patch angioplasty with a running 6-0 Prolene suture.  Prior to completion of the closure the shunt was removed and the usual flushing maneuvers were undertaken.  The anastomosis was completed and flow was restored first to the external and then the internal carotid artery.  Excellent flow characteristics were noted with hand-held Doppler in the internal and external carotid arteries.  The patient was given 50 mg of protamine to reverse the heparin.  The wounds were irrigated with saline.  Hemostasis was obtained with electrocautery.  The wounds were closed with 3-0 Vicryl to reapproximate the sternocleidomastoid over the carotid sheath.  The platysma was lysed with a running 3-0 Vicryl suture.  The skin was closed with a 4-0 subcuticular Vicryl stitch.  Dermabond was applied.  The patient was awakened neurologically intact in the operating room and transferred to the recovery room in stable condition  The patient had platelet dysfunction and did have a great deal of oozing from all suture lines.  Had drain placed at the completion of the case   Curt Jews,  M.D. 11/19/2020 4:47 PM

## 2020-11-19 NOTE — Anesthesia Preprocedure Evaluation (Signed)
Anesthesia Evaluation  Patient identified by MRN, date of birth, ID band Patient awake    Reviewed: Allergy & Precautions, NPO status , Patient's Chart, lab work & pertinent test results  Airway Mallampati: II  TM Distance: >3 FB Neck ROM: Limited    Dental  (+) Teeth Intact, Dental Advisory Given   Pulmonary asthma , COPD, former smoker,    breath sounds clear to auscultation       Cardiovascular hypertension, Pt. on medications + CAD, + Past MI, + Cardiac Stents and + Peripheral Vascular Disease   Rhythm:Regular Rate:Normal   S/p left CEA today, now with expanding hematoma at incision site. No respiratory compromise at this point    Neuro/Psych CVA negative psych ROS   GI/Hepatic GERD  Medicated,  Endo/Other    Renal/GU Renal disease     Musculoskeletal negative musculoskeletal ROS (+)   Abdominal   Peds  Hematology  MDS - on hydroxyurea    Anesthesia Other Findings Covid test negative   Reproductive/Obstetrics                             Anesthesia Physical  Anesthesia Plan  ASA: III and emergent  Anesthesia Plan: General   Post-op Pain Management:    Induction: Intravenous  PONV Risk Score and Plan: 2 and Ondansetron and Treatment may vary due to age or medical condition  Airway Management Planned: Oral ETT and Video Laryngoscope Planned  Additional Equipment: Arterial line  Intra-op Plan:   Post-operative Plan: Extubation in OR  Informed Consent: I have reviewed the patients History and Physical, chart, labs and discussed the procedure including the risks, benefits and alternatives for the proposed anesthesia with the patient or authorized representative who has indicated his/her understanding and acceptance.     Dental advisory given  Plan Discussed with: CRNA and Anesthesiologist  Anesthesia Plan Comments: (See PAT note written 11/14/2020 by Myra Gianotti,  PA-C. HEM-ONC Dr. Lorenso Courier recommended recheck platelets prior to surgery (CBC ordered) and "would recommend his CBC be checked promptly in the post operative period with PRBC transfusion for Hgb <8.0."   )        Anesthesia Quick Evaluation

## 2020-11-19 NOTE — Anesthesia Procedure Notes (Signed)
Arterial Line Insertion Start/End12/15/2021 10:25 AM Performed by: Candis Shine, CRNA, CRNA  Patient location: Pre-op. Preanesthetic checklist: patient identified, IV checked, site marked, risks and benefits discussed, surgical consent, monitors and equipment checked, pre-op evaluation, timeout performed and anesthesia consent Lidocaine 1% used for infiltration Right, radial was placed Catheter size: 20 G Hand hygiene performed  and maximum sterile barriers used   Attempts: 1 Procedure performed without using ultrasound guided technique. Following insertion, dressing applied and Biopatch. Post procedure assessment: normal and unchanged  Patient tolerated the procedure well with no immediate complications.

## 2020-11-19 NOTE — Anesthesia Postprocedure Evaluation (Signed)
Anesthesia Post Note  Patient: Patrick Rodriguez.  Procedure(s) Performed: LEFT CAROTID ENDARTERECTOMY (Left Neck) PATCH ANGIOPLASTY LEFT CAROTID (Left Neck)     Patient location during evaluation: PACU Anesthesia Type: General Level of consciousness: awake and alert and patient cooperative Respiratory status: spontaneous breathing Cardiovascular status: blood pressure returned to baseline Anesthetic complications: no Comments: Patient developing expanding Left neck hematoma in PACU following carotid endartectomy. Mild rightward tracheal deviation, respirations unlabored without stridor. Plan to return to for reexploration.   No complications documented.  Last Vitals:  Vitals:   11/19/20 1750 11/19/20 1805  BP: (!) 148/48 (!) 125/46  Pulse: 63 60  Resp: 16 18  Temp:    SpO2: 97% 96%    Last Pain:  Vitals:   11/19/20 1833  TempSrc:   PainSc: 10-Worst pain ever                 Lexa Coronado COKER

## 2020-11-19 NOTE — Op Note (Signed)
    OPERATIVE REPORT  DATE OF SURGERY: 11/19/2020  PATIENT: Patrick Cruise., 86 y.o. male MRN: [id number]  DOB: 1925/07/29  PRE-OPERATIVE DIAGNOSIS: Left neck hematoma following left carotid endarterectomy  POST-OPERATIVE DIAGNOSIS:  Same  PROCEDURE: Evacuation hematoma and drain placement  SURGEON:  Curt Jews, M.D.  PHYSICIAN ASSISTANT: Nurse  The assistant was needed for exposure and to expedite the case  ANESTHESIA: General  EBL: per anesthesia record  Total I/O In: 400 [I.V.:400] Out: 100 [Blood:100]  BLOOD ADMINISTERED: none  DRAINS: none  SPECIMEN: none  COUNTS CORRECT:  YES  PATIENT DISPOSITION:  PACU - hemodynamically stable  PROCEDURE DETAILS: Patient undergone left carotid endarterectomy this evening for symptomatic severe left internal carotid artery occlusion.  He has Milo proliferative disorder with platelet abnormality.  He was very oozy during the first procedure and after adequate hemostasis was obtained drain was placed and the patient was closed.  In PACU he developed some slowly progressive hematoma.  He did not have any significant tracheal shift and had no stridor.  I was concerned and felt that it was safest to return him to the operating room for evacuation.  I did not suspect any surgical bleeding.  The patient was returned to the operating room general tracheal intubation was achieved without difficulty.  The left neck was reprepped and draped in the usual sterile fashion.  The drain was removed.  The wound was reopened and there was some hematoma in the base of the wound.  This was proximately 80 to 100 cc.  This was evacuated.  There was no evidence of suture line bleeding or other arterial bleeding.  There continues to be general oozing from the surface areas.  Electrocautery was used to help with hemostasis.  No additional sutures were required.  A larger fluted drain was placed through the same drain incision at the base of the neck and was  positioned in the base of the wound.  This was secured to the skin with a 3-0 nylon stitch.  The wound was closed with 3-0 Vicryl to reapproximate the sternocleidomastoid over the carotid sheath.  The platysma was closed with a running 3-0 Vicryl suture and the skin was closed with a 4-0 subcuticular Vicryl stitch.  Sterile dressing was applied and the patient was transferred to the recovery room in stable condition neurologically intact   Patrick Rodriguez, M.D., [facility] 11/19/2020 7:49 PM

## 2020-11-19 NOTE — Anesthesia Procedure Notes (Signed)
Procedure Name: Intubation Date/Time: 11/19/2020 6:57 PM Performed by: Alain Marion, CRNA Pre-anesthesia Checklist: Patient identified, Emergency Drugs available, Suction available and Patient being monitored Patient Re-evaluated:Patient Re-evaluated prior to induction Oxygen Delivery Method: Circle System Utilized Preoxygenation: Pre-oxygenation with 100% oxygen Induction Type: IV induction, Rapid sequence and Cricoid Pressure applied Laryngoscope Size: Glidescope and 4 Grade View: Grade I Tube type: Oral Number of attempts: 1 Airway Equipment and Method: Stylet and Video-laryngoscopy Placement Confirmation: ETT inserted through vocal cords under direct vision,  positive ETCO2 and breath sounds checked- equal and bilateral Secured at: 22 cm Tube secured with: Tape Dental Injury: Teeth and Oropharynx as per pre-operative assessment  Comments: Glidescope utilized d/t left neck hematoma.

## 2020-11-19 NOTE — Anesthesia Procedure Notes (Signed)
Procedure Name: Intubation Date/Time: 11/19/2020 1:27 PM Performed by: Barrington Ellison, CRNA Pre-anesthesia Checklist: Patient identified, Emergency Drugs available, Suction available and Patient being monitored Patient Re-evaluated:Patient Re-evaluated prior to induction Oxygen Delivery Method: Circle System Utilized Preoxygenation: Pre-oxygenation with 100% oxygen Induction Type: IV induction Ventilation: Mask ventilation without difficulty Laryngoscope Size: Mac and 3 Grade View: Grade II Tube type: Oral Tube size: 7.5 mm Number of attempts: 1 Airway Equipment and Method: Stylet and Oral airway Placement Confirmation: ETT inserted through vocal cords under direct vision,  positive ETCO2 and breath sounds checked- equal and bilateral Secured at: 22 cm Tube secured with: Tape Dental Injury: Teeth and Oropharynx as per pre-operative assessment

## 2020-11-19 NOTE — Transfer of Care (Signed)
Immediate Anesthesia Transfer of Care Note  Patient: Octavious Zidek.  Procedure(s) Performed: EVACUATION HEMATOMA LEFT NECK (Left Neck)  Patient Location: PACU  Anesthesia Type:General  Level of Consciousness: awake, alert  and oriented  Airway & Oxygen Therapy: Patient Spontanous Breathing and Patient connected to face mask oxygen  Post-op Assessment: Report given to RN and Post -op Vital signs reviewed and stable  Post vital signs: Reviewed and stable  Last Vitals:  Vitals Value Taken Time  BP 150/87   Temp    Pulse 60   Resp 12   SpO2 100     Last Pain:  Vitals:   11/19/20 1835  TempSrc:   PainSc: 10-Worst pain ever      Patients Stated Pain Goal: 3 (63/89/37 3428)  Complications: No complications documented.

## 2020-11-19 NOTE — Anesthesia Postprocedure Evaluation (Signed)
Anesthesia Post Note  Patient: Patrick Rodriguez.  Procedure(s) Performed: EVACUATION HEMATOMA LEFT NECK (Left Neck)     Patient location during evaluation: PACU Anesthesia Type: General Level of consciousness: awake and alert Pain management: pain level controlled Vital Signs Assessment: post-procedure vital signs reviewed and stable Respiratory status: spontaneous breathing, nonlabored ventilation, respiratory function stable and patient connected to nasal cannula oxygen Cardiovascular status: blood pressure returned to baseline and stable Postop Assessment: no apparent nausea or vomiting Anesthetic complications: no   No complications documented.  Last Vitals:  Vitals:   11/19/20 2045 11/19/20 2100  BP: (!) 125/43 (!) 130/46  Pulse: (!) 55 60  Resp: (!) 8 12  Temp: 36.6 C   SpO2: 95% 92%    Last Pain:  Vitals:   11/19/20 2100  TempSrc:   PainSc: Asleep    LLE Motor Response: Purposeful movement;Responds to commands (11/19/20 2100) LLE Sensation: Full sensation (11/19/20 2100) RLE Motor Response: Purposeful movement;Responds to commands (11/19/20 2100) RLE Sensation: Full sensation (11/19/20 2100)      Audry Pili

## 2020-11-19 NOTE — Interval H&P Note (Signed)
History and Physical Interval Note:  11/19/2020 11:04 AM  Patrick Rodriguez.  has presented today for surgery, with the diagnosis of Symptomatic Carotid Artery Stenosis.  The various methods of treatment have been discussed with the patient and family. After consideration of risks, benefits and other options for treatment, the patient has consented to  Procedure(s): LEFT CAROTID ENDARTERECTOMY (Left) as a surgical intervention.  The patient's history has been reviewed, patient examined, no change in status, stable for surgery.  I have reviewed the patient's chart and labs.  Questions were answered to the patient's satisfaction.     Curt Jews

## 2020-11-19 NOTE — Progress Notes (Signed)
  Hemodynamically stable and PACU.  Neurologically intact.  My last physical exam was proximally 1 hour ago and he did have some hematoma in his left neck.  This is been somewhat progressive over the last hour.  He is in no distress.  I discussed with the patient and his wife by telephone my recommendation to return to the operating room for washout of the hematoma.  His platelet dysfunction is not allowing for him to achieve adequate hemostasis.  This is despite placing a drain at the initial surgery.  May require platelet transfusion.

## 2020-11-19 NOTE — Progress Notes (Signed)
Paged Dr. Donnetta Hutching due to progressive swelling around incision site. Dr. Donnetta Hutching and Dr. Linna Caprice at the bedside with plans to take the patient back to surgery. Dr. Donnetta Hutching updated the patients wife and she gave consent for the procedure over the phone with myself as a witness.   Gabriel Rainwater, RN

## 2020-11-20 ENCOUNTER — Encounter (HOSPITAL_COMMUNITY): Payer: Self-pay | Admitting: Vascular Surgery

## 2020-11-20 LAB — CBC
HCT: 24.5 % — ABNORMAL LOW (ref 39.0–52.0)
Hemoglobin: 8.5 g/dL — ABNORMAL LOW (ref 13.0–17.0)
MCH: 31.1 pg (ref 26.0–34.0)
MCHC: 34.7 g/dL (ref 30.0–36.0)
MCV: 89.7 fL (ref 80.0–100.0)
Platelets: 333 10*3/uL (ref 150–400)
RBC: 2.73 MIL/uL — ABNORMAL LOW (ref 4.22–5.81)
RDW: 20.6 % — ABNORMAL HIGH (ref 11.5–15.5)
WBC: 30 10*3/uL — ABNORMAL HIGH (ref 4.0–10.5)
nRBC: 0 % (ref 0.0–0.2)

## 2020-11-20 LAB — BASIC METABOLIC PANEL
Anion gap: 9 (ref 5–15)
BUN: 31 mg/dL — ABNORMAL HIGH (ref 8–23)
CO2: 22 mmol/L (ref 22–32)
Calcium: 8 mg/dL — ABNORMAL LOW (ref 8.9–10.3)
Chloride: 105 mmol/L (ref 98–111)
Creatinine, Ser: 1.56 mg/dL — ABNORMAL HIGH (ref 0.61–1.24)
GFR, Estimated: 41 mL/min — ABNORMAL LOW (ref >60)
Glucose, Bld: 145 mg/dL — ABNORMAL HIGH (ref 70–99)
Potassium: 4.4 mmol/L (ref 3.5–5.1)
Sodium: 136 mmol/L (ref 135–145)

## 2020-11-20 NOTE — Progress Notes (Signed)
The patient arrived from PACU.  He is alert and oriented but very drowsy.  Says he has "a hard time focusing." His face is asymmetrical on his left side when he smiles but this was already present according to the PACU nurse.  The rest of his neuro assessment is normal.  Patient has been cleaned with CHG wipes and a new gown placed.  Patient is currently resting in bed with the call bell in reach.  Will continue to monitor.  Lupita Dawn, RN

## 2020-11-20 NOTE — Progress Notes (Signed)
The patient's heart rate was mostly in the 50s when he arrived on the unit.  After about three hours, his heart rate started to go down to the 40s and jumped down to the 30s before going back up to the 50s about three times.  It appeared to look like second degree Heart block on the monitor.  Patient was asymptomatic and his blood pressure was about 121/49.  The nurse informed the on call vascular surgeon, who ordered a 12-lead EKG and to place charge pads on the patient.  The EKG showed sinus bradycardia with 2nd degree AV block (Mobitz II).  Dr. Trula Slade was notified of the results and said to just continue to monitor the patient for now.  The patient is resting comfortably in bed with the call bell in reach.  Will continue to monitor.   Lupita Dawn, RN

## 2020-11-20 NOTE — Progress Notes (Signed)
Mobility Specialist - Progress Note   11/20/20 1514  Mobility  Activity Ambulated in room;Ambulated to bathroom  Level of Assistance Contact guard assist, steadying assist  Distance Ambulated (ft) 50 ft  Mobility Response Tolerated well  Mobility performed by Mobility specialist  $Mobility charge 1 Mobility    Pre-mobility: 70 HR, 132/49 BP, 100% SpO2 Post-mobility: 73 HR, 148/46 BP, 100% SpO2  Pt contact guard throughout for safety, did not require any steadying. Pt to toilet in order to have a BM. No assistance for bed mobility or to stand from toilet. Asx throughout.   Pricilla Handler Mobility Specialist Mobility Specialist Phone: [phone]

## 2020-11-20 NOTE — Progress Notes (Signed)
  Progress Note    11/20/2020 3:26 PM 1 Day Post-Op  Subjective:  No complaints. Says he is feeling well. Pain minimal unless neck is touched  Vitals:   11/20/20 0800 11/20/20 1138  BP:  (!) 140/50  Pulse:  63  Resp: 14   Temp:  98.5 F (36.9 C)  SpO2: 100% 100%   Physical Exam: Cardiac: regular Lungs: non labored Incisions:  Left neck with ecchymosis overall soft, hematoma is present but stable. Drain with 40 cc bloody output Extremities:  Moving all extremities without deficits Neurologic: CN intact  CBC    Component Value Date/Time   WBC 30.0 (H) 11/20/2020 0200   RBC 2.73 (L) 11/20/2020 0200   HGB 8.5 (L) 11/20/2020 0200   HGB 8.4 (L) 11/13/2020 1231   HCT 24.5 (L) 11/20/2020 0200   PLT 333 11/20/2020 0200   PLT 260 11/13/2020 1231   MCV 89.7 11/20/2020 0200   MCV 88.9 12/09/2015 1152   MCH 31.1 11/20/2020 0200   MCHC 34.7 11/20/2020 0200   RDW 20.6 (H) 11/20/2020 0200   LYMPHSABS 1.3 11/13/2020 1231   MONOABS 0.4 11/13/2020 1231   EOSABS 0.5 11/13/2020 1231   BASOSABS 0.1 11/13/2020 1231    BMET    Component Value Date/Time   NA 136 11/20/2020 0200   K 4.4 11/20/2020 0200   CL 105 11/20/2020 0200   CO2 22 11/20/2020 0200   GLUCOSE 145 (H) 11/20/2020 0200   BUN 31 (H) 11/20/2020 0200   CREATININE 1.56 (H) 11/20/2020 0200   CREATININE 1.40 (H) 11/13/2020 1231   CREATININE 1.15 (H) 12/09/2015 1144   CALCIUM 8.0 (L) 11/20/2020 0200   GFRNONAA 41 (L) 11/20/2020 0200   GFRNONAA 47 (L) 11/13/2020 1231   GFRNONAA 60 06/19/2015 1119   GFRAA 44 (L) 06/30/2020 1029   GFRAA 70 06/19/2015 1119    INR    Component Value Date/Time   INR 1.2 11/13/2020 1544     Intake/Output Summary (Last 24 hours) at 11/20/2020 1526 Last data filed at 11/20/2020 1521 Gross per 24 hour  Intake 2400.65 ml  Output 870 ml  Net 1530.65 ml     Assessment/Plan:  86 y.o. male is s/p Left CEA 1 Day Post-Op  Neuro remains intact. Drain with <40 cc bloody output. Neck  with small hematoma unchanged from earlier today appears stable. Tolerated ambulating with Mobility specialist. Will get drain out in the morning. Anticipate d/c tomorrow   Karoline Caldwell, [place] [facility] [phone] 11/20/2020 3:26 PM

## 2020-11-20 NOTE — Addendum Note (Signed)
Addendum  created 11/20/20 0756 by Josephine Igo, CRNA   Order list changed, Pharmacy for encounter modified

## 2020-11-20 NOTE — Progress Notes (Signed)
[facility] [place]  Subjective  - has a headache and neck sore.     Objective (!) 132/42 (!) 57 97.9 F (36.6 C) (Oral) 16 100%  Intake/Output Summary (Last 24 hours) at 11/20/2020 0810 Last data filed at 11/20/2020 0738 Gross per 24 hour  Intake 2400.65 ml  Output 1065 ml  Net 1335.65 ml    Left neck with a fair amount of ecchymosis and some recurrent soft hematoma Left neck drain is still fairly bloody with 40 cc documented overnight CN II-XII grossly intact, no neuro deficits  Laboratory Lab Results: Recent Labs    11/19/20 2218 11/20/20 0200  WBC 34.9* 30.0*  HGB 9.2* 8.5*  HCT 27.7* 24.5*  PLT 355 333   BMET Recent Labs    11/19/20 1445 11/19/20 2218 11/20/20 0200  NA 136  --  136  K 4.2  --  4.4  CL  --   --  105  CO2  --   --  22  GLUCOSE  --   --  145*  BUN  --   --  31*  CREATININE  --  1.58* 1.56*  CALCIUM  --   --  8.0*    COAG Lab Results  Component Value Date   INR 1.2 11/13/2020   INR 1.2 10/20/2020   INR 1.11 08/11/2011   No results found for: PTT  Assessment/Planning: POD #1 status post left CEA.  Unfortunately had to return to the OR for washout and drain placement given platelet dysfunction and hematoma in PACU.  This morning he is neurologically intact.  Only complaint is a mild headache.  He does have fair amount of ecchymosis in the neck with some recurrent hematoma but very soft.  We will plan to leave his drain 1 more day given output is still fairly bloody.  Only 40 mL recorded overnight.  We will get his A-line DC'd.  I think it be fine for him to get out of bed to a chair and try to walk.  Labs this morning hemoglobin 8.5.  White count 30,000.  Platelets 333.  Marty Heck 11/20/2020 8:10 AM --

## 2020-11-21 ENCOUNTER — Ambulatory Visit: Payer: Medicare Other | Admitting: *Deleted

## 2020-11-21 LAB — CBC
HCT: 23.7 % — ABNORMAL LOW (ref 39.0–52.0)
Hemoglobin: 8 g/dL — ABNORMAL LOW (ref 13.0–17.0)
MCH: 30.2 pg (ref 26.0–34.0)
MCHC: 33.8 g/dL (ref 30.0–36.0)
MCV: 89.4 fL (ref 80.0–100.0)
Platelets: 297 10*3/uL (ref 150–400)
RBC: 2.65 MIL/uL — ABNORMAL LOW (ref 4.22–5.81)
RDW: 20.4 % — ABNORMAL HIGH (ref 11.5–15.5)
WBC: 17.2 10*3/uL — ABNORMAL HIGH (ref 4.0–10.5)
nRBC: 0 % (ref 0.0–0.2)

## 2020-11-21 MED ORDER — TRAMADOL HCL 50 MG PO TABS: 50.0000 mg | ORAL_TABLET | Freq: Two times a day (BID) | ORAL | 0 refills | Status: DC | PRN

## 2020-11-21 MED ORDER — TRAMADOL HCL 50 MG PO TABS
50.0000 mg | ORAL_TABLET | Freq: Two times a day (BID) | ORAL | 0 refills | Status: DC | PRN
Start: 2020-11-21 — End: 2020-11-21

## 2020-11-21 MED ORDER — TRAMADOL HCL 50 MG PO TABS: 50.0000 mg | ORAL_TABLET | Freq: Four times a day (QID) | ORAL | 0 refills | Status: DC | PRN

## 2020-11-21 NOTE — Discharge Summary (Addendum)
Carotid Discharge Summary     Patrick Rodriguez. [birth year] 86 y.o. male  [id number]  Admission Date: 11/19/2020  Discharge Date: 11/21/20  Physician: Rosetta Posner, MD  Admission Diagnosis: Cerebral infarction due to stenosis of left carotid artery [facility] Course:  The patient was admitted to the hospital and taken to the operating room on 11/19/2020 and underwent left carotid endarterectomy for symptomatic left ICA stenosis.  The pt tolerated the procedure well and was transported to the PACU in good condition. Patient remained neurologically intact. In the PACU he developed a progressive left neck hematoma. He subsequently was taken back to the OR. He underwent evacuation of the hematoma and a drain was placed. Patient tolerated the procedure and was transported to PACU in stable condition.  Patient remained neurologically intact and stable in the PACU. No further progression of hematoma or evidence of continued bleeding. He was transferred to 4E in stable condition. Over night he did develop some sinus bradycardia. He remained asymptomatic.  Otherwise hemodynamically intact. EKG showed 2nd degree AV block. Patient was continued to be monitored  By POD 1, the pt neuro status remained intact. He did well remainder of evening. Mild headache and neck soreness present. Left neck hematoma soft and stable. Ecchymosis present. Approximately 40 cc of bloody drainage and clots in drain. Hemodynamically stable.   The remainder of the hospital course consisted of increasing mobilization and increasing intake of solids without difficulty.  Left neck drain re examined later on POD#1. Only 5 more cc of bloody output recorded. It was felt stable for removal.  POD#2 patient remained neurologically intact. Left neck soft and improved appearance over night. Hemodynamically stable. Hgb 8 at his baseline. He will have follow up with Hematologist Dr. Lorenso Courier on 12/23. He is stable for  discharge home. PDMP was reviewed and post operative pain medications sent to patients pharmacy. He will continue his other home medications as prescribed. He will remain on Aspirin and Statin. He has follow up arranged with Dr. Donnetta Hutching in [place] in 2-3 weeks   Recent Labs    11/19/20 1445 11/20/20 0200  NA 136 136  K 4.2 4.4  CL  --  105  CO2  --  22  GLUCOSE  --  145*  BUN  --  31*  CALCIUM  --  8.0*   Recent Labs    11/20/20 0200 11/21/20 0050  WBC 30.0* 17.2*  HGB 8.5* 8.0*  HCT 24.5* 23.7*  PLT 333 297   No results for input(s): INR in the last 72 hours.   Discharge Instructions    Discharge patient   Complete by: As directed    Discharge disposition: 01-Home or Self Care   Discharge patient date: 11/21/2020      Discharge Diagnosis:  Cerebral infarction due to stenosis of left carotid artery [facility]) [I63.232]  Secondary Diagnosis: Patient Active Problem List   Diagnosis Date Noted  . Cerebral infarction due to stenosis of left carotid artery ([place]) 11/19/2020  . Left-sided weakness 10/21/2020  . Myelodysplastic syndrome ([place]) 10/21/2020  . Leukocytosis 10/21/2020  . Thrombocytosis 10/21/2020  . Acute respiratory failure with hypoxia ([place]) 10/21/2020  . CKD (chronic kidney disease) stage 3, GFR 30-59 ml/min (HCC) 10/21/2020  . Cerebral embolism with cerebral infarction 10/21/2020  . Cerebrovascular accident (CVA) due to thrombosis of right vertebral artery ([place]) 10/21/2020  . COPD with acute exacerbation ([place]) 12/13/2017  . Esophageal dysphagia ? gerd related 11/11/2014  . (HFpEF) heart failure  with preserved ejection fraction ([place]) 12/14/2013  . Anemia 12/14/2013  . Acute post-hemorrhagic anemia 12/04/2013  . Cough 12/03/2013  . Carotid stenosis 11/06/2013  . Psoriasiform dermatitis 09/20/2012  . Edema 05/01/2012  . Syncope 12/29/2011  . CAD (coronary artery disease) 10/26/2011  . Asthma exacerbation 09/06/2011  . Acute bronchitis 09/06/2011  .  Hypercholesterolemia 03/14/2008  . Essential hypertension 03/14/2008  . MYOCARDIAL INFARCTION 03/14/2008  . ALLERGIC RHINITIS 03/14/2008  . COPD GOLD I 03/14/2008  . ANGINA, HX OF 03/14/2008   Past Medical History:  Diagnosis Date  . Allergic rhinitis   . Basal cell carcinoma 01/07/2020   left preauricular  . Basal cell carcinoma 06/25/2020   right tip of nose  . Carotid artery stenosis    12/2011:  50-69% on the left and 1-49% on the right;  Carotid US (11/2013): Bilateral 40-59%; repeat 1 year  . Cataract   . CKD (chronic kidney disease)   . COPD (chronic obstructive pulmonary disease) ([place])   . Coronary artery disease    a.  s/p stenting of the LAD in 2000;  b. Lexiscan Myoview (12/2013): No ischemia, EF 70%; normal study  . ED (erectile dysfunction)   . GERD (gastroesophageal reflux disease)   . History of kidney stones    Per patient "had one or two over the years"  . Hyperlipidemia   . Hyperplastic colon polyp 11/15/2013   x3  . Hypertension   . Inguinal hernia    per patient on the right side  . Myelodysplastic syndrome ([place])   . Myocardial infarction ([place])   . Normal echocardiogram Jan 2013   EF of 55% and no wall motion abnormalities  . Normal nuclear stress test 2008  . Obesity   . Palpitations    Holter (11/2013): NSR, sinus brady, PVCs, rare blocked PAC, no sig arrhythmia  . Pneumonia   . Squamous cell carcinoma of skin 01/07/2020   right lat elbow  . Stroke ([place]) 10/20/2020  . Substance abuse ([place])   . Syncope Jan 2013   felt to be vasovagal; had normal echo, carotids ok, 1st degree AV block with mention (but no tracing) of 2nd degree type I AV block while in [place]  . Tubular adenoma 11/15/2013   x2    Allergies as of 11/21/2020      Reactions   Finasteride    Other reaction(s): fuzzy headed   Hctz [hydrochlorothiazide] Other (See Comments)   Has had hyponatremia   Tamsulosin Hcl    Other reaction(s): fuzzy headed      Medication List    STOP  taking these medications   mometasone-formoterol 100-5 MCG/ACT Aero Commonly known as: DULERA     TAKE these medications   amLODipine 10 MG tablet Commonly known as: NORVASC TAKE 1 TABLET BY MOUTH DAILY   aspirin 81 MG tablet Take 1 tablet (81 mg total) by mouth daily.   atorvastatin 40 MG tablet Commonly known as: LIPITOR Take 1 tablet (40 mg total) by mouth daily.   b complex vitamins tablet Take 1 tablet by mouth daily.   Dupilumab 300 MG/2ML Sopn Inject 300 mg into the skin every 14 (fourteen) days.   hydroxyurea 500 MG capsule Commonly known as: HYDREA Take 1 capsule (500 mg total) by mouth daily. May take with food to minimize GI side effects.   magnesium gluconate 500 MG tablet Commonly known as: MAGONATE Take 500 mg by mouth daily.   Nattokinase 100 MG Caps Take 200 mg by mouth daily.  omeprazole 40 MG capsule Commonly known as: PRILOSEC Take 40 mg by mouth daily.   traMADol 50 MG tablet Commonly known as: Ultram Take 1 tablet (50 mg total) by mouth every 12 (twelve) hours as needed for severe pain.   TURMERIC PO Take 2 tablets by mouth daily.   vitamin C 1000 MG tablet Take 1,000 mg by mouth daily.   VITAMIN E PO Take 1 tablet by mouth daily.   zinc gluconate 50 MG tablet Take 50 mg by mouth daily.        Discharge Instructions:   [facility] Discharge Instructions Carotid Endarterectomy (CEA)  Please refer to the following instructions for your post-procedure care. Your surgeon or physician assistant will discuss any changes with you.  Activity  You are encouraged to walk as much as you can. You can slowly return to normal activities but must avoid strenuous activity and heavy lifting until your doctor tell you it's OK. Avoid activities such as vacuuming or swinging a golf club. You can drive after one week if you are comfortable and you are no longer taking prescription pain medications. It is normal to  feel tired for serval weeks after your surgery. It is also normal to have difficulty with sleep habits, eating, and bowel movements after surgery. These will go away with time.  Bathing/Showering  You may shower after you come home. Do not soak in a bathtub, hot tub, or swim until the incision heals completely.  Incision Care  Shower every day. Clean your incision with mild soap and water. Pat the area dry with a clean towel. You do not need a bandage unless otherwise instructed. Do not apply any ointments or creams to your incision. You may have skin glue on your incision. Do not peel it off. It will come off on its own in about one week. Your incision may feel thickened and raised for several weeks after your surgery. This is normal and the skin will soften over time. For Men Only: It's OK to shave around the incision but do not shave the incision itself for 2 weeks. It is common to have numbness under your chin that could last for several months.  Diet  Resume your normal diet. There are no special food restrictions following this procedure. A low fat/low cholesterol diet is recommended for all patients with vascular disease. In order to heal from your surgery, it is CRITICAL to get adequate nutrition. Your body requires vitamins, minerals, and protein. Vegetables are the best source of vitamins and minerals. Vegetables also provide the perfect balance of protein. Processed food has little nutritional value, so try to avoid this.  Medications  Resume taking all of your medications unless your doctor or physician assistant tells you not to.  If your incision is causing pain, you may take over-the- counter pain relievers such as acetaminophen (Tylenol). If you were prescribed a stronger pain medication, please be aware these medications can cause nausea and constipation.  Prevent nausea by taking the medication with a snack or meal. Avoid constipation by drinking plenty of fluids and eating foods  with a high amount of fiber, such as fruits, vegetables, and grains. Do not take Tylenol if you are taking prescription pain medications.  Follow Up  Our office will schedule a follow up appointment 2-3 weeks following discharge.  Please call us immediately for any of the following conditions  . Increased pain, redness, drainage (pus) from your incision site. . Fever of  101 degrees or higher. . If you should develop stroke (slurred speech, difficulty swallowing, weakness on one side of your body, loss of vision) you should call 911 and go to the nearest emergency room. .  Reduce your risk of vascular disease:  . Stop smoking. If you would like help call [facility] at 1-800-QUIT-NOW (802)255-3035) or [place] at 8196406374. . Manage your cholesterol . Maintain a desired weight . Control your diabetes . Keep your blood pressure down .  If you have any questions, please call the office at [phone].  Prescriptions given: Tramadol 50 mg #15 No Refill  Disposition: Home  Patient's condition: is Good  Follow up: 1. Dr. Donnetta Hutching in 2-3 weeks.   Abishai Viegas PA-C [facility] [phone]   --- For [facility] Registry use ---   Modified Rankin score at D/C (0-6): 0  IV medication needed for:  1. Hypertension: No 2. Hypotension: No  Post-op Complications: Yes  1. Post-op CVA or TIA: No  If yes: Event classification (right eye, left eye, right cortical, left cortical, verterobasilar, other): n/a  If yes: Timing of event (intra-op, <6 hrs post-op, >=6 hrs post-op, unknown): n/a  2. CN injury: No  If yes: CN n/a injuried   3. Myocardial infarction: No  If yes: Dx by (EKG or clinical, Troponin): n/a  4.  CHF: No  5.  Dysrhythmia (new): No  6. Wound infection: No  7. Reperfusion symptoms: No  8. Return to OR: Yes  If yes: return to OR for (bleeding, neurologic, other CEA incision, other): left neck hematoma   Discharge  medications: Statin use:  Yes ASA use:  Yes   Beta blocker use:  No ACE-Inhibitor use:  No  ARB use:  No CCB use: Yes P2Y12 Antagonist use: No, [ ]  Plavix, [ ]  Plasugrel, [ ]  Ticlopinine, [ ]  Ticagrelor, [ ]  Other, [ ]  No for medical reason, [ ]  Non-compliant, [ ]  Not-indicated Anti-coagulant use:  No, [ ]  Warfarin, [ ]  Rivaroxaban, [ ]  Dabigatran,

## 2020-11-21 NOTE — Consult Note (Signed)
   [facility] CM Inpatient Consult   11/21/2020  Kyron Schlitt. [birth year] [id number]  [place] Organization [ACO] Patient: [employer] Active Status: pending EMMI previous calls  Patient is to be followed by a [place] Nurse, children's. Patient transitioned home.  Natividad Brood, RN BSN [facility] Liaison  [phone] business mobile phone Toll free office [phone]  Fax number: [phone] [email or web address]

## 2020-11-21 NOTE — Progress Notes (Signed)
Pt discharged today to home with wife.  Pt's IV removed.  Pt taken off telemetry and CCMD notified.  Pt left with all of their personal belongings.  AVS documentation reviewed with Pt and all questions answered.

## 2020-11-21 NOTE — Progress Notes (Addendum)
  Progress Note    11/21/2020 7:55 AM 2 Days Post-Op  Subjective: no major complaints   Vitals:   11/20/20 2309 11/21/20 0504  BP: (!) 153/49 (!) 162/49  Pulse: 70 72  Resp: 18 17  Temp: 98 F (36.7 C) 98.8 F (37.1 C)  SpO2: 93% 99%   Physical Exam: Cardiac: regular Lungs:  Non labored Incisions: left neck incision is clean, dry and intact. Hematoma present soft, no expansion. Very tender as expected. Ecchymosis present. No bleeding from drain site. Extremities: moving all extremities without deficits Neurologic: alert and oriented. CN intact  CBC    Component Value Date/Time   WBC 17.2 (H) 11/21/2020 0050   RBC 2.65 (L) 11/21/2020 0050   HGB 8.0 (L) 11/21/2020 0050   HGB 8.4 (L) 11/13/2020 1231   HCT 23.7 (L) 11/21/2020 0050   PLT 297 11/21/2020 0050   PLT 260 11/13/2020 1231   MCV 89.4 11/21/2020 0050   MCV 88.9 12/09/2015 1152   MCH 30.2 11/21/2020 0050   MCHC 33.8 11/21/2020 0050   RDW 20.4 (H) 11/21/2020 0050   LYMPHSABS 1.3 11/13/2020 1231   MONOABS 0.4 11/13/2020 1231   EOSABS 0.5 11/13/2020 1231   BASOSABS 0.1 11/13/2020 1231    BMET    Component Value Date/Time   NA 136 11/20/2020 0200   K 4.4 11/20/2020 0200   CL 105 11/20/2020 0200   CO2 22 11/20/2020 0200   GLUCOSE 145 (H) 11/20/2020 0200   BUN 31 (H) 11/20/2020 0200   CREATININE 1.56 (H) 11/20/2020 0200   CREATININE 1.40 (H) 11/13/2020 1231   CREATININE 1.15 (H) 12/09/2015 1144   CALCIUM 8.0 (L) 11/20/2020 0200   GFRNONAA 41 (L) 11/20/2020 0200   GFRNONAA 47 (L) 11/13/2020 1231   GFRNONAA 60 06/19/2015 1119   GFRAA 44 (L) 06/30/2020 1029   GFRAA 70 06/19/2015 1119    INR    Component Value Date/Time   INR 1.2 11/13/2020 1544     Intake/Output Summary (Last 24 hours) at 11/21/2020 0755 Last data filed at 11/20/2020 1924 Gross per 24 hour  Intake 240 ml  Output 455 ml  Net -215 ml     Assessment/Plan:  86 y.o. male is s/p L CEA 2 Days Post-Op. Left neck hematoma stable.  Drain was removed yesterday. Neck soft. Neurologically intact. Hemodynamically stable. Patient is stable for discharge home. He will have follow up with Dr. Donnetta Hutching in 2-3 weeks  Karoline Caldwell, [place] [facility] [phone] 11/21/2020 7:55 AM   I have seen and evaluated the patient. I agree with the PA note as documented above.  Postop day 2 status post left carotid endarterectomy.  Unfortunately had to go back to the OR for washout of left neck hematoma with platelet dysfunction.  We removed his drain yesterday evening since it did not have any output during the day.  His neck actually looks better today and is much softer.  Remains neurologically intact.  Plan for discharge today.  Discussed he can follow-up in [place] but he would prefer to follow-up with Dr. Donnetta Hutching in [place].  Discussed he call with questions or concerns.  He does have a fair amount of ecchymosis and bruising to the neck which should improve with time.  Marty Heck, MD [facility] [place] Office: [phone]

## 2020-11-23 LAB — TYPE AND SCREEN
ABO/RH(D): A POS
Antibody Screen: NEGATIVE
Unit division: 0
Unit division: 0
Unit division: 0
Unit division: 0

## 2020-11-23 LAB — BPAM RBC
Blood Product Expiration Date: 202201102359
Blood Product Expiration Date: 202201112359
Blood Product Expiration Date: 202201112359
Blood Product Expiration Date: 202201112359
ISSUE DATE / TIME: 202112151451
ISSUE DATE / TIME: 202112151451
Unit Type and Rh: 6200
Unit Type and Rh: 6200
Unit Type and Rh: 6200
Unit Type and Rh: 6200

## 2020-11-24 ENCOUNTER — Other Ambulatory Visit: Payer: Self-pay

## 2020-11-24 ENCOUNTER — Other Ambulatory Visit: Payer: Self-pay | Admitting: *Deleted

## 2020-11-24 MED ORDER — TRAMADOL HCL 50 MG PO TABS: 50.0000 mg | ORAL_TABLET | Freq: Two times a day (BID) | ORAL | 0 refills | Status: AC | PRN

## 2020-11-24 NOTE — Progress Notes (Signed)
Patient called, unable to get tramadol rx filled because it was unsigned. Confirmed with [place]. Resent original tramadol script as electronic. Patient aware.

## 2020-11-24 NOTE — Patient Outreach (Signed)
[facility]) Care Management  11/24/2020  Patrick Rodriguez. 1925/01/06 [id number]   Call placed to member to follow up on surgery.  He report he is recovering well, using prescribed pains for pain control.  Denies any complications of procedure, will have follow up with surgeon on 1/10.  Has appointment with hematology on 12/23.  Discussed ongoing needs, denies but state he will contact this care manager if anything changes.  Will close case at this time.  [name], [place], MSN [place] [phone]

## 2020-11-26 ENCOUNTER — Other Ambulatory Visit: Payer: Medicare Other

## 2020-11-26 ENCOUNTER — Ambulatory Visit: Payer: Medicare Other | Admitting: Hematology and Oncology

## 2020-11-26 ENCOUNTER — Telehealth: Payer: Self-pay | Admitting: *Deleted

## 2020-11-26 ENCOUNTER — Other Ambulatory Visit: Payer: Self-pay | Admitting: Hematology and Oncology

## 2020-11-26 DIAGNOSIS — D469 Myelodysplastic syndrome, unspecified: Secondary | ICD-10-CM

## 2020-11-26 NOTE — Progress Notes (Signed)
[place] [phone]   [phone]  PROGRESS NOTE  Patient Care Team: Maury Dus, MD as PCP - General (Family Medicine) [place], Peter M, MD as PCP - Cardiology (Cardiology)  Hematological/Oncological History  #Myelodysplastic Syndrome/ NOS Myeloproliferative Syndrome 1)07/26/2018: WBC 5.1, Hgb 11.6, MCV 94.4, Plt 363 2) 9//2020: WBC 25.1, Hgb 9.2, Plt 1083, MCV 93.2 (in context of eczema flare) 3) 09/20/2019: Iron 128, TIBC 242 (low), Iron sat 53%, transferrin 173 4) 10/15/2019: WBC 14.3, Hgb 8.8, Plt 485, MCV 90.6. FOB negative 5) 11/07/2019: Establish care with Dr. Lorenso Courier 6) 11/26/2019: Bone marrow biopsy performed. Showed hypercellular marrow with 9% blasts, concern for MDS vs MPN. MDS panel negative for definitive mutation. WBC 29.3, Hgb 8.3, Plt 1008.  7) 12/14/2019: WBC 30.1, Hgb 7.8, Plt 1718. Received 1 units of PRBC for symptomatic anemia. BCR/ABL and JAK2 w/ reflex panels sent. Positive for U2AF1 and SRSF2 gene mutations. Neither are canonical mutations for MPN/MDS 8) 02/21/2020: received 1 unit of PRBC for Hgb 8.5 (symptomatic) 9) 04/24/2020: received 1 unit of PRBC for Hgb 8.2 (symptomatic) 10) 05/19/2020:  received 1 unit of PRBC for Hgb 8.0 (symptomatic) 11) 10/20/2020: presented to ED with aphasia, concern for stroke. Found to have WBC 96.9, Hgb 8.0, Plt 2111. Started hydoryxurea 572m PO daily.  12) 11/19/2020: underwent Left carotid endarterectomy and evacuation of hematoma from left neck.   Interval History:  JEliya Geiman 86y.o. male with medical history significant for leukocytosis, thrombocytosis, and anemia presents for a follow up visit. The patient's last visit was on 10/29/20. In the interim since the last visit Mr. BLongsworthunderwent a carotid endarterectomy on 11/19/2020. His Plt count prior to the surgery was dropped with hydroxyurea from 2111 on 10/21/2020 to 260 on 11/13/2020.   On exam today Mr. BPatellareports that he has  been well since his discharge from the hospital.  He has some swelling on the left side of his neck at the incision site but is otherwise well-healed.  There is some continued tenderness but no overt bleeding.  He reports that the surgery was "pretty rough".  And that he tried to take tramadol in order to help with his pain but it "put me in a fog" for the rest of the day.  He reports he has been taking alternating Tylenol and ibuprofen in order to help deal with his pain.  He is also curious as to why he has to take Lipitor now as he was previously taking red yeast rice.  Otherwise his energy level is at baseline and he is having no issues with stomach upset.  He denies any fevers, chills, sweats, nausea, vomiting or diarrhea.  The bulk of our discussion focused on hydroxyurea therapy moving forward and that discussion is detailed below.  MEDICAL HISTORY:  Past Medical History:  Diagnosis Date  . Allergic rhinitis   . Basal cell carcinoma 01/07/2020   left preauricular  . Basal cell carcinoma 06/25/2020   right tip of nose  . Carotid artery stenosis    12/2011:  50-69% on the left and 1-49% on the right;  Carotid [place](11/2013): Bilateral 40-59%; repeat 1 year  . Cataract   . CKD (chronic kidney disease)   . COPD (chronic obstructive pulmonary disease) ([place]   . Coronary artery disease    a.  s/p stenting of the LAD in 2000;  b. Lexiscan Myoview (12/2013): No ischemia, EF 70%; normal study  . ED (erectile dysfunction)   .  GERD (gastroesophageal reflux disease)   . History of kidney stones    Per patient "had one or two over the years"  . Hyperlipidemia   . Hyperplastic colon polyp 11/15/2013   x3  . Hypertension   . Inguinal hernia    per patient on the right side  . Myelodysplastic syndrome ([place])   . Myocardial infarction ([place])   . Normal echocardiogram Jan 2013   EF of 55% and no wall motion abnormalities  . Normal nuclear stress test 2008  . Obesity   . Palpitations    Holter  (11/2013): NSR, sinus brady, PVCs, rare blocked PAC, no sig arrhythmia  . Pneumonia   . Squamous cell carcinoma of skin 01/07/2020   right lat elbow  . Stroke ([place]) 10/20/2020  . Substance abuse ([place])   . Syncope Jan 2013   felt to be vasovagal; had normal echo, carotids ok, 1st degree AV block with mention (but no tracing) of 2nd degree type I AV block while in [place]  . Tubular adenoma 11/15/2013   x2    SURGICAL HISTORY: Past Surgical History:  Procedure Laterality Date  . APPENDECTOMY    . CARDIAC CATHETERIZATION  01/20/1999   EF 60%  . CARDIOVASCULAR STRESS TEST  04/19/2007   EF 74%  . CATARACT EXTRACTION W/ INTRAOCULAR LENS  IMPLANT, BILATERAL    . CORONARY STENT PLACEMENT  2000   LAD  . DOPPLER ECHOCARDIOGRAPHY  02/06/1998   EF 60%  . ENDARTERECTOMY Left 11/19/2020   Procedure: LEFT CAROTID ENDARTERECTOMY;  Surgeon: Rosetta Posner, MD;  Location: [facility];  Service: Vascular;  Laterality: Left;  . EVACUATION HEMATOMA LEFT NECK (Left Neck) Left 11/19/2020  . HEMATOMA EVACUATION Left 11/19/2020   Procedure: EVACUATION HEMATOMA LEFT NECK;  Surgeon: Rosetta Posner, MD;  Location: [facility];  Service: Vascular;  Laterality: Left;  . PATCH ANGIOPLASTY Left 11/19/2020   Procedure: PATCH ANGIOPLASTY LEFT CAROTID;  Surgeon: Rosetta Posner, MD;  Location: [facility];  Service: Vascular;  Laterality: Left;  . TONSILLECTOMY     ALLERGIES:  is allergic to finasteride, hctz [hydrochlorothiazide], and tamsulosin hcl.  MEDICATIONS:  Current Outpatient Medications  Medication Sig Dispense Refill  . amLODipine (NORVASC) 10 MG tablet TAKE 1 TABLET BY MOUTH DAILY (Patient taking differently: Take 10 mg by mouth daily.) 90 tablet 3  . Ascorbic Acid (VITAMIN C) 1000 MG tablet Take 1,000 mg by mouth daily.      Marland Kitchen aspirin 81 MG tablet Take 1 tablet (81 mg total) by mouth daily.    Marland Kitchen atorvastatin (LIPITOR) 40 MG tablet Take 1 tablet (40 mg total) by mouth daily. 30 tablet 2  . b complex vitamins tablet Take 1  tablet by mouth daily.      . Dupilumab 300 MG/2ML SOPN Inject 300 mg into the skin every 14 (fourteen) days.     . hydroxyurea (HYDREA) 500 MG capsule Take 1 capsule (500 mg total) by mouth daily. May take with food to minimize GI side effects. 30 capsule 2  . magnesium gluconate (MAGONATE) 500 MG tablet Take 500 mg by mouth daily.      . Nattokinase 100 MG CAPS Take 200 mg by mouth daily.     Marland Kitchen omeprazole (PRILOSEC) 40 MG capsule Take 40 mg by mouth daily.    . traMADol (ULTRAM) 50 MG tablet Take 1 tablet (50 mg total) by mouth every 12 (twelve) hours as needed for severe pain. 8 tablet 0  . TURMERIC PO Take  2 tablets by mouth daily.     Marland Kitchen VITAMIN E PO Take 1 tablet by mouth daily.    Marland Kitchen zinc gluconate 50 MG tablet Take 50 mg by mouth daily.     No current facility-administered medications for this visit.    REVIEW OF SYSTEMS:   Constitutional: ( - ) fevers, ( - )  chills , ( - ) night sweats Eyes: ( - ) blurriness of vision, ( - ) double vision, ( - ) watery eyes Ears, nose, mouth, throat, and face: ( - ) mucositis, ( - ) sore throat Respiratory: ( - ) cough, ( - ) dyspnea, ( - ) wheezes Cardiovascular: ( - ) palpitation, ( - ) chest discomfort, ( - ) lower extremity swelling Gastrointestinal:  ( - ) nausea, ( - ) heartburn, ( - ) change in bowel habits Skin: ( - ) abnormal skin rashes Lymphatics: ( - ) new lymphadenopathy, ( - ) easy bruising Neurological: ( - ) numbness, ( - ) tingling, ( - ) new weaknesses Behavioral/Psych: ( - ) mood change, ( - ) new changes  All other systems were reviewed with the patient and are negative.  PHYSICAL EXAMINATION: ECOG PERFORMANCE STATUS: 1 - Symptomatic but completely ambulatory  Vitals:   11/27/20 1250  BP: (!) 185/60  Pulse: 83  Resp: 18  Temp: 98.4 F (36.9 C)  SpO2: 100%   Filed Weights   11/27/20 1250  Weight: 147 lb 11.2 oz (67 kg)    GENERAL: well appearing elderly Caucasian male in NAD SKIN: skin color, texture, turgor are  normal, no rashes or significant lesions EYES: conjunctiva are pink and non-injected, sclera clear NECK: swelling on left side of neck with well healing incision. Tender to palpation.  LUNGS: clear to auscultation and percussion with normal breathing effort HEART: regular rate & rhythm and no murmurs and +1 bilateral lower extremity edema Musculoskeletal: no cyanosis of digits and no clubbing  PSYCH: alert & oriented x 3, fluent speech NEURO: no focal motor/sensory deficits  LABORATORY DATA:  I have reviewed the data as listed CBC Latest Ref Rng & Units 11/27/2020 11/21/2020 11/20/2020  WBC 4.0 - 10.5 K/uL 38.6(H) 17.2(H) 30.0(H)  Hemoglobin 13.0 - 17.0 g/dL 9.3(L) 8.0(L) 8.5(L)  Hematocrit 39.0 - 52.0 % 29.3(L) 23.7(L) 24.5(L)  Platelets 150 - 400 K/uL 836(H) 297 333    CMP Latest Ref Rng & Units 11/27/2020 11/20/2020 11/19/2020  Glucose 70 - 99 mg/dL 103(H) 145(H) -  BUN 8 - 23 mg/dL 17 31(H) -  Creatinine 0.61 - 1.24 mg/dL 1.36(H) 1.56(H) 1.58(H)  Sodium 135 - 145 mmol/L 138 136 136  Potassium 3.5 - 5.1 mmol/L 4.4 4.4 4.2  Chloride 98 - 111 mmol/L 104 105 -  CO2 22 - 32 mmol/L 28 22 -  Calcium 8.9 - 10.3 mg/dL 9.3 8.0(L) -  Total Protein 6.5 - 8.1 g/dL 6.6 - -  Total Bilirubin 0.3 - 1.2 mg/dL 0.4 - -  Alkaline Phos 38 - 126 U/L 57 - -  AST 15 - 41 U/L 24 - -  ALT 0 - 44 U/L 12 - -    RADIOGRAPHIC STUDIES: No results found.  ASSESSMENT & PLAN Alven Alverio. 86 y.o. male with medical history significant for leukocytosis, thrombocytosis, and anemia presents for a follow up visit. His lab findings and bone marrow biopsy are consistent with MDS vs NOS MPN.   On exam today Mr. Sermon reports he has progressed well since his surgery on  11/19/2020.  He notes he is not having any bleeding and the wound is healing nicely.  His platelet counts have rebounded up to greater than 800 as he has been off the hydroxyurea therapy at that time.  Today we discussed the benefits of continuing  the hydroxyurea therapy and dosing strategies.  We agreed to 500 mg hydroxyurea daily and if his platelet count began to drop too low we could consider 500 mg every other day instead.  He voices understanding of this plan and was aware that it require weekly CBCs in order to assure his counts did not drop too far.  Bone marrow biopsy performed on 11/26/2019 showed a hypercellular marrow consistent with MDS versus MPN with 9% blasts.  MDS panel was negative for definitive mutation at that time. Additional workup showed no JAK2, CALR, MPL, or BCR/ABL. He does carry 2 mutations  U2AF1 and SRSF2 which are of unclear clinical significance  We previously discussed that our working diagnosis at this time is MDS with some component of MPN.  We talked about treatments for MDS, however the patient noted that he would not want any chemotherapy regimens.  I did briefly note that hypomethylation therapy is not technically chemotherapy, but he declined these IV treatments at this time. On further discussion we talked about the nature of his disease and the fact that he carries 2 nonclinical mutations but no targetable mutations.  We also discussed that this could be treated as an MDS with hypomethylating therapy, but he noted that that is not something that he would want to consider at this time.  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    Given his advanced age I would agree that supportive therapy with transfusions would not be an unreasonable option if this was consistent with his goals of care. We can also consider erythropoetin therapy if the frequency of blood transfusions increases to >2 per month, though with his other elevated blood findings that may not be advisable.   #Marked Thrombocytosis/Leukocytosis --patient admitted for aphasia and was noted to have a stroke. Fortunately he has returned to baseline neurological function --started  hydroxyurea 520m PO daily while in the hospital. Marked declines in WBC and platelets without major decline in Hgb.  --restart this therapy (hydroxyrea 5022mdaily) with weekly monitoring of CBC.  --weekly labs with clinic visit in 4 weeks time.   #Myelodysplastic Syndrome/ NOS Myeloproliferative Syndrome --bone marrow biopsy on 11/26/2019 showed hypercellular marrow with concern for MDS vs MPN. MDS panel negative for definitive mutation and canonical mutations such as JAK2, CALR, MPL, and BCR/ABL were negative. He does carry 2 mutations  U2AF1 and SRSF2 which are of unclear clinical significance --discussed the findings with the patient. He was agreeable to a comfort based approach where we focus on keeping his Hgb elevated to prevent symptoms. He did not wish to proceed with any hypomethylators/chemotherapy based treatments.  --no indication for blood transfusion based on today's labs  All questions were answered. The patient knows to call the clinic with any problems, questions or concerns.  A total of more than 30 minutes were spent on this encounter and over half of that time was spent on counseling and coordination of care as outlined above.   JoLedell PeoplesMD Department of Hematology/Oncology CoRobertst WeUhs Binghamton General Hospitalhone: [phone]: [phone]: [email or web address]  11/27/2020 3:16 PM

## 2020-11-26 NOTE — Telephone Encounter (Signed)
Received call from patient asking if he absolutely needed to come in tomorrow for labs and to see Dr. Lorenso Courier. Advised that Dr. Lorenso Courier does need to see him tomorrow to evaluate resuming his Hydrea. Pt had stopped in prior to his carotid endarterectomy. He has not resumed taking it at this time. He voiced understanding for need to come in. Offered to have him come in earlier in the day. He is agreeable to that. Scheduling message sent for changing labs at 12:30 and MD appt at 1pm

## 2020-11-27 ENCOUNTER — Encounter: Payer: Self-pay | Admitting: Hematology and Oncology

## 2020-11-27 ENCOUNTER — Other Ambulatory Visit: Payer: Self-pay

## 2020-11-27 ENCOUNTER — Inpatient Hospital Stay: Payer: Medicare Other

## 2020-11-27 ENCOUNTER — Inpatient Hospital Stay: Payer: Medicare Other | Admitting: Hematology and Oncology

## 2020-11-27 VITALS — BP 185/60 | HR 83 | Temp 98.4°F | Resp 18 | Ht 67.0 in | Wt 147.7 lb

## 2020-11-27 DIAGNOSIS — D469 Myelodysplastic syndrome, unspecified: Secondary | ICD-10-CM | POA: Diagnosis not present

## 2020-11-27 DIAGNOSIS — J449 Chronic obstructive pulmonary disease, unspecified: Secondary | ICD-10-CM | POA: Diagnosis not present

## 2020-11-27 DIAGNOSIS — Z7982 Long term (current) use of aspirin: Secondary | ICD-10-CM | POA: Diagnosis not present

## 2020-11-27 DIAGNOSIS — D75839 Thrombocytosis, unspecified: Secondary | ICD-10-CM | POA: Diagnosis not present

## 2020-11-27 DIAGNOSIS — Z79899 Other long term (current) drug therapy: Secondary | ICD-10-CM | POA: Diagnosis not present

## 2020-11-27 DIAGNOSIS — D464 Refractory anemia, unspecified: Secondary | ICD-10-CM

## 2020-11-27 DIAGNOSIS — I252 Old myocardial infarction: Secondary | ICD-10-CM | POA: Diagnosis not present

## 2020-11-27 DIAGNOSIS — I10 Essential (primary) hypertension: Secondary | ICD-10-CM | POA: Diagnosis not present

## 2020-11-27 DIAGNOSIS — N189 Chronic kidney disease, unspecified: Secondary | ICD-10-CM | POA: Diagnosis not present

## 2020-11-27 LAB — CBC WITH DIFFERENTIAL (CANCER CENTER ONLY)
Abs Immature Granulocytes: 7.14 10*3/uL — ABNORMAL HIGH (ref 0.00–0.07)
Basophils Absolute: 0.2 10*3/uL — ABNORMAL HIGH (ref 0.0–0.1)
Basophils Relative: 0 %
Eosinophils Absolute: 1 10*3/uL — ABNORMAL HIGH (ref 0.0–0.5)
Eosinophils Relative: 3 %
HCT: 29.3 % — ABNORMAL LOW (ref 39.0–52.0)
Hemoglobin: 9.3 g/dL — ABNORMAL LOW (ref 13.0–17.0)
Immature Granulocytes: 19 %
Lymphocytes Relative: 5 %
Lymphs Abs: 1.9 10*3/uL (ref 0.7–4.0)
MCH: 29.8 pg (ref 26.0–34.0)
MCHC: 31.7 g/dL (ref 30.0–36.0)
MCV: 93.9 fL (ref 80.0–100.0)
Monocytes Absolute: 1.5 10*3/uL — ABNORMAL HIGH (ref 0.1–1.0)
Monocytes Relative: 4 %
Neutro Abs: 26.9 10*3/uL — ABNORMAL HIGH (ref 1.7–7.7)
Neutrophils Relative %: 69 %
Platelet Count: 836 10*3/uL — ABNORMAL HIGH (ref 150–400)
RBC: 3.12 MIL/uL — ABNORMAL LOW (ref 4.22–5.81)
RDW: 19.9 % — ABNORMAL HIGH (ref 11.5–15.5)
WBC Count: 38.6 10*3/uL — ABNORMAL HIGH (ref 4.0–10.5)
nRBC: 0.1 % (ref 0.0–0.2)

## 2020-11-27 LAB — CMP (CANCER CENTER ONLY)
ALT: 12 U/L (ref 0–44)
AST: 24 U/L (ref 15–41)
Albumin: 3.5 g/dL (ref 3.5–5.0)
Alkaline Phosphatase: 57 U/L (ref 38–126)
Anion gap: 6 (ref 5–15)
BUN: 17 mg/dL (ref 8–23)
CO2: 28 mmol/L (ref 22–32)
Calcium: 9.3 mg/dL (ref 8.9–10.3)
Chloride: 104 mmol/L (ref 98–111)
Creatinine: 1.36 mg/dL — ABNORMAL HIGH (ref 0.61–1.24)
GFR, Estimated: 49 mL/min — ABNORMAL LOW (ref >60)
Glucose, Bld: 103 mg/dL — ABNORMAL HIGH (ref 70–99)
Potassium: 4.4 mmol/L (ref 3.5–5.1)
Sodium: 138 mmol/L (ref 135–145)
Total Bilirubin: 0.4 mg/dL (ref 0.3–1.2)
Total Protein: 6.6 g/dL (ref 6.5–8.1)

## 2020-11-27 LAB — LACTATE DEHYDROGENASE: LDH: 493 U/L — ABNORMAL HIGH (ref 98–192)

## 2020-12-03 ENCOUNTER — Other Ambulatory Visit: Payer: Self-pay | Admitting: *Deleted

## 2020-12-03 ENCOUNTER — Other Ambulatory Visit: Payer: Self-pay

## 2020-12-03 DIAGNOSIS — D469 Myelodysplastic syndrome, unspecified: Secondary | ICD-10-CM

## 2020-12-03 MED ORDER — DUPIXENT 300 MG/2ML ~~LOC~~ SOSY: 300.0000 mg | PREFILLED_SYRINGE | SUBCUTANEOUS | 5 refills | Status: AC

## 2020-12-03 NOTE — Progress Notes (Signed)
Dupixent RFs to [facility].

## 2020-12-04 ENCOUNTER — Inpatient Hospital Stay: Payer: Medicare Other

## 2020-12-04 ENCOUNTER — Telehealth: Payer: Self-pay

## 2020-12-04 ENCOUNTER — Telehealth: Payer: Self-pay | Admitting: *Deleted

## 2020-12-04 ENCOUNTER — Other Ambulatory Visit: Payer: Self-pay

## 2020-12-04 DIAGNOSIS — D469 Myelodysplastic syndrome, unspecified: Secondary | ICD-10-CM

## 2020-12-04 DIAGNOSIS — D75839 Thrombocytosis, unspecified: Secondary | ICD-10-CM | POA: Diagnosis not present

## 2020-12-04 DIAGNOSIS — I252 Old myocardial infarction: Secondary | ICD-10-CM | POA: Diagnosis not present

## 2020-12-04 DIAGNOSIS — N189 Chronic kidney disease, unspecified: Secondary | ICD-10-CM | POA: Diagnosis not present

## 2020-12-04 DIAGNOSIS — Z7982 Long term (current) use of aspirin: Secondary | ICD-10-CM | POA: Diagnosis not present

## 2020-12-04 DIAGNOSIS — Z79899 Other long term (current) drug therapy: Secondary | ICD-10-CM | POA: Diagnosis not present

## 2020-12-04 DIAGNOSIS — J449 Chronic obstructive pulmonary disease, unspecified: Secondary | ICD-10-CM | POA: Diagnosis not present

## 2020-12-04 DIAGNOSIS — I10 Essential (primary) hypertension: Secondary | ICD-10-CM | POA: Diagnosis not present

## 2020-12-04 LAB — CBC WITH DIFFERENTIAL (CANCER CENTER ONLY)
Abs Immature Granulocytes: 6.84 10*3/uL — ABNORMAL HIGH (ref 0.00–0.07)
Basophils Absolute: 0.2 10*3/uL — ABNORMAL HIGH (ref 0.0–0.1)
Basophils Relative: 1 %
Eosinophils Absolute: 0.7 10*3/uL — ABNORMAL HIGH (ref 0.0–0.5)
Eosinophils Relative: 2 %
HCT: 30.4 % — ABNORMAL LOW (ref 39.0–52.0)
Hemoglobin: 9.8 g/dL — ABNORMAL LOW (ref 13.0–17.0)
Immature Granulocytes: 18 %
Lymphocytes Relative: 7 %
Lymphs Abs: 2.7 10*3/uL (ref 0.7–4.0)
MCH: 28.9 pg (ref 26.0–34.0)
MCHC: 32.2 g/dL (ref 30.0–36.0)
MCV: 89.7 fL (ref 80.0–100.0)
Monocytes Absolute: 1.9 10*3/uL — ABNORMAL HIGH (ref 0.1–1.0)
Monocytes Relative: 5 %
Neutro Abs: 26.3 10*3/uL — ABNORMAL HIGH (ref 1.7–7.7)
Neutrophils Relative %: 67 %
Platelet Count: 911 10*3/uL (ref 150–400)
RBC: 3.39 MIL/uL — ABNORMAL LOW (ref 4.22–5.81)
RDW: 19.4 % — ABNORMAL HIGH (ref 11.5–15.5)
WBC Count: 38.7 10*3/uL — ABNORMAL HIGH (ref 4.0–10.5)
WBC Morphology: 3
nRBC: 0.1 % (ref 0.0–0.2)

## 2020-12-04 LAB — SAMPLE TO BLOOD BANK

## 2020-12-04 NOTE — Telephone Encounter (Signed)
CRITICAL VALUE STICKER  CRITICAL VALUE: Platelets = 911  RECEIVER (on-site recipient of call):  DATE & TIME NOTIFIED: 12/04/20 at 11:50am  MESSENGER (representative from lab): Byrd Hesselbach  MD NOTIFIED: Dr. Leonides Schanz  TIME OF NOTIFICATION: 12/04/20 at 11:55am  RESPONSE: Notification given to Waynetta Sandy, RN for follow-up with the provider.

## 2020-12-04 NOTE — Telephone Encounter (Signed)
[name] patient regarding his lab results from this morning. Advised that his HGB is better @ 9.8 but his platelets are up to 911k..  Patrick Rodriguez is currently on Hydrea @ 500mg  daily. Advised that Dr. is out of the office today but will return on Monday, 12/08/20. Advised that he will review the lab results and we will get back to him after that. Pt voiced understanding. He states he feels well, that his neck continues to heal after his recent carotid endarterectomy.02/05/21 His awa re of his next lab appt on 12/13/19

## 2020-12-11 ENCOUNTER — Inpatient Hospital Stay: Payer: Medicare Other | Attending: Hematology and Oncology

## 2020-12-11 ENCOUNTER — Other Ambulatory Visit: Payer: Self-pay | Admitting: *Deleted

## 2020-12-11 ENCOUNTER — Telehealth: Payer: Self-pay | Admitting: *Deleted

## 2020-12-11 ENCOUNTER — Other Ambulatory Visit: Payer: Self-pay

## 2020-12-11 DIAGNOSIS — D469 Myelodysplastic syndrome, unspecified: Secondary | ICD-10-CM

## 2020-12-11 LAB — CBC WITH DIFFERENTIAL (CANCER CENTER ONLY)
Abs Immature Granulocytes: 2.55 10*3/uL — ABNORMAL HIGH (ref 0.00–0.07)
Basophils Absolute: 0.1 10*3/uL (ref 0.0–0.1)
Basophils Relative: 1 %
Eosinophils Absolute: 0.7 10*3/uL — ABNORMAL HIGH (ref 0.0–0.5)
Eosinophils Relative: 3 %
HCT: 30.2 % — ABNORMAL LOW (ref 39.0–52.0)
Hemoglobin: 9.9 g/dL — ABNORMAL LOW (ref 13.0–17.0)
Immature Granulocytes: 9 %
Lymphocytes Relative: 7 %
Lymphs Abs: 1.8 10*3/uL (ref 0.7–4.0)
MCH: 29.6 pg (ref 26.0–34.0)
MCHC: 32.8 g/dL (ref 30.0–36.0)
MCV: 90.4 fL (ref 80.0–100.0)
Monocytes Absolute: 1.4 10*3/uL — ABNORMAL HIGH (ref 0.1–1.0)
Monocytes Relative: 5 %
Neutro Abs: 21 10*3/uL — ABNORMAL HIGH (ref 1.7–7.7)
Neutrophils Relative %: 75 %
Platelet Count: 669 10*3/uL — ABNORMAL HIGH (ref 150–400)
RBC: 3.34 MIL/uL — ABNORMAL LOW (ref 4.22–5.81)
RDW: 19.2 % — ABNORMAL HIGH (ref 11.5–15.5)
WBC Count: 27.5 10*3/uL — ABNORMAL HIGH (ref 4.0–10.5)
WBC Morphology: 3
nRBC: 0.1 % (ref 0.0–0.2)

## 2020-12-11 LAB — SAMPLE TO BLOOD BANK

## 2020-12-11 NOTE — Telephone Encounter (Signed)
-----   Message from Ulysees Barns IV, MD sent at 12/11/2020  1:13 PM EST ----- Platelets down to 667. Hgb stable. We will continue to monitor.  ----- Message ----- From: Interface, Lab In [place] Sent: 12/11/2020  12:19 PM EST To: Jaci Standard, MD

## 2020-12-11 NOTE — Telephone Encounter (Signed)
[name] patient regarding his lab results from today.  Spoke with patient and advised that his platelets are coming down nicely. Today they are 669, down from 911. Patrick Rodriguez had 2 questions:  #1  Can he skip labs for next week and have them checked as scheduled on 12/18/20  #2  He is asking if he needs to stay on the Hydrea the rest of his life. C/o some dizzyness but no other side effects noted.  Advised that he should discuss with Patrick Rodriguez at his next appt.  Dr. Thressa Sheller advise appt next week's lab appt-if pt can skip this one or not.  Thank you

## 2020-12-12 NOTE — Telephone Encounter (Signed)
[name] patient to advise that Dr. Lorenso Courier is ok for pt to skip lab appt next week.  He is aware of his lab and MD appt in 2 weeks

## 2020-12-15 ENCOUNTER — Ambulatory Visit (INDEPENDENT_AMBULATORY_CARE_PROVIDER_SITE_OTHER): Payer: Self-pay | Admitting: Vascular Surgery

## 2020-12-15 ENCOUNTER — Encounter: Payer: Self-pay | Admitting: Vascular Surgery

## 2020-12-15 ENCOUNTER — Other Ambulatory Visit: Payer: Self-pay

## 2020-12-15 VITALS — BP 178/70 | HR 85 | Temp 98.4°F | Resp 16 | Ht 67.0 in | Wt 144.0 lb

## 2020-12-15 DIAGNOSIS — Z9889 Other specified postprocedural states: Secondary | ICD-10-CM

## 2020-12-15 NOTE — Progress Notes (Signed)
   [facility]  Patient name: Patrick Rodriguez. MRN: [id number] DOB: May 11, 1925 Sex: male  REASON FOR VISIT: Here today for follow-up of left carotid endarterectomy for severe symptomatic disease  HPI: Patrick Rodriguez. is a 87 y.o. male here today for follow-up.  He had presented with left brain event and underwent left carotid endarterectomy on 11/19/2020.  He has known myelo dysplastic disease with platelet abnormality.  He did have some moderate hematoma in the PACU and was returned to the operating room for evacuation.  He did well following this and was discharged home without difficulty.  He has had no new neurologic deficits.  He is back to his usual activities.  He is driving himself without difficulty.  Current Outpatient Medications  Medication Sig Dispense Refill  . amLODipine (NORVASC) 10 MG tablet TAKE 1 TABLET BY MOUTH DAILY (Patient taking differently: Take 10 mg by mouth daily.) 90 tablet 3  . Ascorbic Acid (VITAMIN C) 1000 MG tablet Take 1,000 mg by mouth daily.      Marland Kitchen aspirin 81 MG tablet Take 1 tablet (81 mg total) by mouth daily.    Marland Kitchen atorvastatin (LIPITOR) 40 MG tablet Take 1 tablet (40 mg total) by mouth daily. 30 tablet 2  . b complex vitamins tablet Take 1 tablet by mouth daily.      . dupilumab (DUPIXENT) 300 MG/2ML prefilled syringe Inject 300 mg into the skin every 14 (fourteen) days. Starting at day 15 for maintenance. 4 mL 5  . hydroxyurea (HYDREA) 500 MG capsule Take 1 capsule (500 mg total) by mouth daily. May take with food to minimize GI side effects. 30 capsule 2  . magnesium gluconate (MAGONATE) 500 MG tablet Take 500 mg by mouth daily.      . Nattokinase 100 MG CAPS Take 200 mg by mouth daily.     Marland Kitchen omeprazole (PRILOSEC) 40 MG capsule Take 40 mg by mouth daily.    . traMADol (ULTRAM) 50 MG tablet Take 1 tablet (50 mg total) by mouth every 12 (twelve) hours as needed for severe pain. 8 tablet 0  .  TURMERIC PO Take 2 tablets by mouth daily.     Marland Kitchen VITAMIN E PO Take 1 tablet by mouth daily.    Marland Kitchen zinc gluconate 50 MG tablet Take 50 mg by mouth daily.     No current facility-administered medications for this visit.     PHYSICAL EXAM: Vitals:   12/15/20 1505  BP: (!) 178/70  Pulse: 85  Resp: 16  Temp: 98.4 F (36.9 C)  TempSrc: Other (Comment)  SpO2: 97%  Weight: 144 lb (65.3 kg)  Height: 5\' 7"  (1.702 m)    GENERAL: The patient is a well-nourished male, in no acute distress. The vital signs are documented above. Left neck incision is well-healed with no hematoma noted.  No carotid bruits bilaterally. Neurologically intact  MEDICAL ISSUES: Stable status post left carotid endarterectomy for symptomatic disease.  Will resume full activity with no limitation.  We will see him in our [place] office in 9 months with repeat carotid duplex   Rosetta Posner, MD [facility] Tel [phone]

## 2020-12-18 ENCOUNTER — Other Ambulatory Visit: Payer: Medicare Other

## 2020-12-25 ENCOUNTER — Telehealth: Payer: Self-pay | Admitting: Hematology and Oncology

## 2020-12-25 ENCOUNTER — Telehealth: Payer: Self-pay | Admitting: *Deleted

## 2020-12-25 NOTE — Telephone Encounter (Signed)
Rescheduled follow-up appointment per 1/20 schedule message. Patient is aware of changes.

## 2020-12-25 NOTE — Telephone Encounter (Signed)
Received call from patient. He is calling to cancel his appts for Friday, 12/26/20 as he has covid. Symptoms started on 12/11/20.  Pt can return to the cancer center in 21 days. Advised that I would schedule him for labs and MD visit on 01/12/21. Pt states he stopped taking his Hydrea when he started feeling bad. Advised that he needs to continue his Hydrea, despite having Covid. Pt voiced understanding.  Scheduled message sent.  Dr. Lorenso Courier made aware,

## 2020-12-26 ENCOUNTER — Inpatient Hospital Stay: Payer: Medicare Other | Admitting: Hematology and Oncology

## 2020-12-26 ENCOUNTER — Inpatient Hospital Stay: Payer: Medicare Other

## 2020-12-29 ENCOUNTER — Other Ambulatory Visit: Payer: Self-pay

## 2020-12-29 ENCOUNTER — Emergency Department (HOSPITAL_COMMUNITY): Payer: Medicare Other

## 2020-12-29 ENCOUNTER — Encounter (HOSPITAL_COMMUNITY): Payer: Self-pay

## 2020-12-29 ENCOUNTER — Inpatient Hospital Stay (HOSPITAL_COMMUNITY): Payer: Medicare Other

## 2020-12-29 ENCOUNTER — Inpatient Hospital Stay (HOSPITAL_COMMUNITY)
Admission: EM | Admit: 2020-12-29 | Discharge: 2021-01-06 | DRG: 871 | Disposition: E | Payer: Medicare Other | Attending: Internal Medicine | Admitting: Internal Medicine

## 2020-12-29 DIAGNOSIS — R7989 Other specified abnormal findings of blood chemistry: Secondary | ICD-10-CM | POA: Diagnosis not present

## 2020-12-29 DIAGNOSIS — D469 Myelodysplastic syndrome, unspecified: Secondary | ICD-10-CM | POA: Diagnosis present

## 2020-12-29 DIAGNOSIS — Z8673 Personal history of transient ischemic attack (TIA), and cerebral infarction without residual deficits: Secondary | ICD-10-CM

## 2020-12-29 DIAGNOSIS — R54 Age-related physical debility: Secondary | ICD-10-CM | POA: Diagnosis present

## 2020-12-29 DIAGNOSIS — K219 Gastro-esophageal reflux disease without esophagitis: Secondary | ICD-10-CM | POA: Diagnosis present

## 2020-12-29 DIAGNOSIS — E7800 Pure hypercholesterolemia, unspecified: Secondary | ICD-10-CM | POA: Diagnosis present

## 2020-12-29 DIAGNOSIS — J449 Chronic obstructive pulmonary disease, unspecified: Secondary | ICD-10-CM | POA: Diagnosis present

## 2020-12-29 DIAGNOSIS — Z87891 Personal history of nicotine dependence: Secondary | ICD-10-CM

## 2020-12-29 DIAGNOSIS — R739 Hyperglycemia, unspecified: Secondary | ICD-10-CM | POA: Diagnosis present

## 2020-12-29 DIAGNOSIS — I4892 Unspecified atrial flutter: Secondary | ICD-10-CM | POA: Diagnosis present

## 2020-12-29 DIAGNOSIS — N179 Acute kidney failure, unspecified: Secondary | ICD-10-CM | POA: Diagnosis present

## 2020-12-29 DIAGNOSIS — G9341 Metabolic encephalopathy: Secondary | ICD-10-CM | POA: Diagnosis present

## 2020-12-29 DIAGNOSIS — U071 COVID-19: Secondary | ICD-10-CM

## 2020-12-29 DIAGNOSIS — I252 Old myocardial infarction: Secondary | ICD-10-CM

## 2020-12-29 DIAGNOSIS — Z800 Family history of malignant neoplasm of digestive organs: Secondary | ICD-10-CM

## 2020-12-29 DIAGNOSIS — E872 Acidosis, unspecified: Secondary | ICD-10-CM

## 2020-12-29 DIAGNOSIS — Z888 Allergy status to other drugs, medicaments and biological substances status: Secondary | ICD-10-CM

## 2020-12-29 DIAGNOSIS — Z79899 Other long term (current) drug therapy: Secondary | ICD-10-CM

## 2020-12-29 DIAGNOSIS — Z66 Do not resuscitate: Secondary | ICD-10-CM | POA: Diagnosis present

## 2020-12-29 DIAGNOSIS — E876 Hypokalemia: Secondary | ICD-10-CM

## 2020-12-29 DIAGNOSIS — J1282 Pneumonia due to coronavirus disease 2019: Secondary | ICD-10-CM | POA: Diagnosis present

## 2020-12-29 DIAGNOSIS — N183 Chronic kidney disease, stage 3 unspecified: Secondary | ICD-10-CM | POA: Diagnosis present

## 2020-12-29 DIAGNOSIS — E861 Hypovolemia: Secondary | ICD-10-CM | POA: Diagnosis present

## 2020-12-29 DIAGNOSIS — N1832 Chronic kidney disease, stage 3b: Secondary | ICD-10-CM | POA: Diagnosis present

## 2020-12-29 DIAGNOSIS — I2510 Atherosclerotic heart disease of native coronary artery without angina pectoris: Secondary | ICD-10-CM | POA: Diagnosis present

## 2020-12-29 DIAGNOSIS — A4189 Other specified sepsis: Principal | ICD-10-CM | POA: Diagnosis present

## 2020-12-29 DIAGNOSIS — J9601 Acute respiratory failure with hypoxia: Secondary | ICD-10-CM

## 2020-12-29 DIAGNOSIS — D631 Anemia in chronic kidney disease: Secondary | ICD-10-CM | POA: Diagnosis present

## 2020-12-29 DIAGNOSIS — I5033 Acute on chronic diastolic (congestive) heart failure: Secondary | ICD-10-CM | POA: Diagnosis present

## 2020-12-29 DIAGNOSIS — I509 Heart failure, unspecified: Secondary | ICD-10-CM

## 2020-12-29 DIAGNOSIS — D75839 Thrombocytosis, unspecified: Secondary | ICD-10-CM | POA: Diagnosis present

## 2020-12-29 DIAGNOSIS — I5032 Chronic diastolic (congestive) heart failure: Secondary | ICD-10-CM

## 2020-12-29 DIAGNOSIS — Z85828 Personal history of other malignant neoplasm of skin: Secondary | ICD-10-CM

## 2020-12-29 DIAGNOSIS — E785 Hyperlipidemia, unspecified: Secondary | ICD-10-CM | POA: Diagnosis present

## 2020-12-29 DIAGNOSIS — J440 Chronic obstructive pulmonary disease with acute lower respiratory infection: Secondary | ICD-10-CM | POA: Diagnosis present

## 2020-12-29 DIAGNOSIS — D696 Thrombocytopenia, unspecified: Secondary | ICD-10-CM | POA: Diagnosis present

## 2020-12-29 DIAGNOSIS — D72829 Elevated white blood cell count, unspecified: Secondary | ICD-10-CM

## 2020-12-29 DIAGNOSIS — I130 Hypertensive heart and chronic kidney disease with heart failure and stage 1 through stage 4 chronic kidney disease, or unspecified chronic kidney disease: Secondary | ICD-10-CM | POA: Diagnosis present

## 2020-12-29 DIAGNOSIS — Z9889 Other specified postprocedural states: Secondary | ICD-10-CM

## 2020-12-29 DIAGNOSIS — I6529 Occlusion and stenosis of unspecified carotid artery: Secondary | ICD-10-CM | POA: Diagnosis present

## 2020-12-29 DIAGNOSIS — Z955 Presence of coronary angioplasty implant and graft: Secondary | ICD-10-CM

## 2020-12-29 DIAGNOSIS — I10 Essential (primary) hypertension: Secondary | ICD-10-CM | POA: Diagnosis present

## 2020-12-29 DIAGNOSIS — R0902 Hypoxemia: Secondary | ICD-10-CM

## 2020-12-29 DIAGNOSIS — A419 Sepsis, unspecified organism: Secondary | ICD-10-CM | POA: Insufficient documentation

## 2020-12-29 DIAGNOSIS — R0602 Shortness of breath: Secondary | ICD-10-CM | POA: Diagnosis present

## 2020-12-29 DIAGNOSIS — J9600 Acute respiratory failure, unspecified whether with hypoxia or hypercapnia: Secondary | ICD-10-CM | POA: Diagnosis present

## 2020-12-29 DIAGNOSIS — Z7982 Long term (current) use of aspirin: Secondary | ICD-10-CM

## 2020-12-29 LAB — I-STAT ARTERIAL BLOOD GAS, ED
Acid-base deficit: 8 mmol/L — ABNORMAL HIGH (ref 0.0–2.0)
Bicarbonate: 17.2 mmol/L — ABNORMAL LOW (ref 20.0–28.0)
Calcium, Ion: 1.24 mmol/L (ref 1.15–1.40)
HCT: 30 % — ABNORMAL LOW (ref 39.0–52.0)
Hemoglobin: 10.2 g/dL — ABNORMAL LOW (ref 13.0–17.0)
O2 Saturation: 91 %
Patient temperature: 102.5
Potassium: 4 mmol/L (ref 3.5–5.1)
Sodium: 138 mmol/L (ref 135–145)
TCO2: 18 mmol/L — ABNORMAL LOW (ref 22–32)
pCO2 arterial: 36.7 mmHg (ref 32.0–48.0)
pH, Arterial: 7.289 — ABNORMAL LOW (ref 7.350–7.450)
pO2, Arterial: 75 mmHg — ABNORMAL LOW (ref 83.0–108.0)

## 2020-12-29 LAB — CBC WITH DIFFERENTIAL/PLATELET
Abs Immature Granulocytes: 0.4 10*3/uL — ABNORMAL HIGH (ref 0.00–0.07)
Basophils Absolute: 0 10*3/uL (ref 0.0–0.1)
Basophils Relative: 0 %
Eosinophils Absolute: 0.8 10*3/uL — ABNORMAL HIGH (ref 0.0–0.5)
Eosinophils Relative: 2 %
HCT: 27.3 % — ABNORMAL LOW (ref 39.0–52.0)
Hemoglobin: 8.9 g/dL — ABNORMAL LOW (ref 13.0–17.0)
Lymphocytes Relative: 1 %
Lymphs Abs: 0.4 10*3/uL — ABNORMAL LOW (ref 0.7–4.0)
MCH: 28.3 pg (ref 26.0–34.0)
MCHC: 32.6 g/dL (ref 30.0–36.0)
MCV: 86.9 fL (ref 80.0–100.0)
Monocytes Absolute: 0 10*3/uL — ABNORMAL LOW (ref 0.1–1.0)
Monocytes Relative: 0 %
Myelocytes: 1 %
Neutro Abs: 39.8 10*3/uL — ABNORMAL HIGH (ref 1.7–7.7)
Neutrophils Relative %: 96 %
Platelets: 430 10*3/uL — ABNORMAL HIGH (ref 150–400)
RBC: 3.14 MIL/uL — ABNORMAL LOW (ref 4.22–5.81)
RDW: 20 % — ABNORMAL HIGH (ref 11.5–15.5)
WBC: 41.5 10*3/uL — ABNORMAL HIGH (ref 4.0–10.5)
nRBC: 0 % (ref 0.0–0.2)
nRBC: 0 /100 WBC

## 2020-12-29 LAB — COMPREHENSIVE METABOLIC PANEL
ALT: 41 U/L (ref 0–44)
AST: 67 U/L — ABNORMAL HIGH (ref 15–41)
Albumin: 2.6 g/dL — ABNORMAL LOW (ref 3.5–5.0)
Alkaline Phosphatase: 60 U/L (ref 38–126)
Anion gap: 13 (ref 5–15)
BUN: 72 mg/dL — ABNORMAL HIGH (ref 8–23)
CO2: 17 mmol/L — ABNORMAL LOW (ref 22–32)
Calcium: 8.9 mg/dL (ref 8.9–10.3)
Chloride: 104 mmol/L (ref 98–111)
Creatinine, Ser: 2.28 mg/dL — ABNORMAL HIGH (ref 0.61–1.24)
GFR, Estimated: 26 mL/min — ABNORMAL LOW (ref >60)
Glucose, Bld: 121 mg/dL — ABNORMAL HIGH (ref 70–99)
Potassium: 3.8 mmol/L (ref 3.5–5.1)
Sodium: 134 mmol/L — ABNORMAL LOW (ref 135–145)
Total Bilirubin: 1.4 mg/dL — ABNORMAL HIGH (ref 0.3–1.2)
Total Protein: 6.1 g/dL — ABNORMAL LOW (ref 6.5–8.1)

## 2020-12-29 LAB — PROTIME-INR
INR: 1.5 — ABNORMAL HIGH (ref 0.8–1.2)
Prothrombin Time: 17.3 seconds — ABNORMAL HIGH (ref 11.4–15.2)

## 2020-12-29 LAB — BRAIN NATRIURETIC PEPTIDE: B Natriuretic Peptide: 434.9 pg/mL — ABNORMAL HIGH (ref 0.0–100.0)

## 2020-12-29 LAB — LACTIC ACID, PLASMA
Lactic Acid, Venous: 1.7 mmol/L (ref 0.5–1.9)
Lactic Acid, Venous: 1.3 mmol/L (ref 0.5–1.9)

## 2020-12-29 LAB — SARS CORONAVIRUS 2 BY RT PCR (HOSPITAL ORDER, PERFORMED IN ~~LOC~~ HOSPITAL LAB): SARS Coronavirus 2: POSITIVE — AB

## 2020-12-29 LAB — SAMPLE TO BLOOD BANK

## 2020-12-29 LAB — LACTATE DEHYDROGENASE: LDH: 716 U/L — ABNORMAL HIGH (ref 98–192)

## 2020-12-29 LAB — TSH: TSH: 1.442 u[IU]/mL (ref 0.350–4.500)

## 2020-12-29 LAB — FIBRINOGEN: Fibrinogen: 703 mg/dL — ABNORMAL HIGH (ref 210–475)

## 2020-12-29 LAB — APTT: aPTT: 37 seconds — ABNORMAL HIGH (ref 24–36)

## 2020-12-29 LAB — AMMONIA: Ammonia: 41 umol/L — ABNORMAL HIGH (ref 9–35)

## 2020-12-29 LAB — D-DIMER, QUANTITATIVE: D-Dimer, Quant: 16.05 ug/mL-FEU — ABNORMAL HIGH (ref 0.00–0.50)

## 2020-12-29 MED ORDER — SODIUM CHLORIDE 0.9 % IV BOLUS
1000.0000 mL | Freq: Once | INTRAVENOUS | Status: AC
  Administered 2020-12-29: 1000 mL via INTRAVENOUS

## 2020-12-29 MED ORDER — ZINC SULFATE 220 (50 ZN) MG PO CAPS
220.0000 mg | ORAL_CAPSULE | Freq: Every day | ORAL | Status: DC
  Administered 2020-12-30 – 2020-12-31 (×2): 220 mg via ORAL
  Filled 2020-12-29 (×2): qty 1

## 2020-12-29 MED ORDER — SODIUM CHLORIDE 0.9 % IV SOLN
500.0000 mg | INTRAVENOUS | Status: DC
  Administered 2020-12-30 – 2020-12-31 (×2): 500 mg via INTRAVENOUS
  Filled 2020-12-29 (×3): qty 500

## 2020-12-29 MED ORDER — SODIUM CHLORIDE 0.9 % IV SOLN
1.0000 g | Freq: Once | INTRAVENOUS | Status: AC
  Administered 2020-12-29: 1 g via INTRAVENOUS
  Filled 2020-12-29: qty 10

## 2020-12-29 MED ORDER — SODIUM CHLORIDE 0.9 % IV SOLN
500.0000 mg | Freq: Once | INTRAVENOUS | Status: AC
  Administered 2020-12-29: 500 mg via INTRAVENOUS
  Filled 2020-12-29: qty 500

## 2020-12-29 MED ORDER — SODIUM CHLORIDE 0.9 % IV SOLN: 100.0000 mg | Freq: Every day | INTRAVENOUS | Status: DC

## 2020-12-29 MED ORDER — HYDROCOD POLST-CPM POLST ER 10-8 MG/5ML PO SUER: 5.0000 mL | Freq: Two times a day (BID) | ORAL | Status: DC | PRN

## 2020-12-29 MED ORDER — VITAMIN D 25 MCG (1000 UNIT) PO TABS
1000.0000 [IU] | ORAL_TABLET | Freq: Every day | ORAL | Status: DC
  Administered 2020-12-30 – 2020-12-31 (×2): 1000 [IU] via ORAL
  Filled 2020-12-29 (×2): qty 1

## 2020-12-29 MED ORDER — ALBUTEROL SULFATE HFA 108 (90 BASE) MCG/ACT IN AERS
1.0000 | INHALATION_SPRAY | Freq: Four times a day (QID) | RESPIRATORY_TRACT | Status: DC | PRN
  Administered 2020-12-31: 1 via RESPIRATORY_TRACT
  Filled 2020-12-29: qty 6.7

## 2020-12-29 MED ORDER — SODIUM CHLORIDE 0.9 % IV SOLN
200.0000 mg | Freq: Once | INTRAVENOUS | Status: AC
  Administered 2020-12-30: 200 mg via INTRAVENOUS
  Filled 2020-12-29: qty 40

## 2020-12-29 MED ORDER — GUAIFENESIN-DM 100-10 MG/5ML PO SYRP: 10.0000 mL | ORAL_SOLUTION | ORAL | Status: DC | PRN

## 2020-12-29 MED ORDER — IPRATROPIUM-ALBUTEROL 0.5-2.5 (3) MG/3ML IN SOLN: 3.0000 mL | Freq: Four times a day (QID) | RESPIRATORY_TRACT | Status: DC | PRN

## 2020-12-29 MED ORDER — ASCORBIC ACID 500 MG PO TABS
500.0000 mg | ORAL_TABLET | Freq: Every day | ORAL | Status: DC
  Administered 2020-12-30 – 2020-12-31 (×2): 500 mg via ORAL
  Filled 2020-12-29 (×2): qty 1

## 2020-12-29 MED ORDER — ACETAMINOPHEN 325 MG PO TABS: 650.0000 mg | ORAL_TABLET | Freq: Four times a day (QID) | ORAL | Status: DC | PRN

## 2020-12-29 MED ORDER — HEPARIN SODIUM (PORCINE) 5000 UNIT/ML IJ SOLN: 5000.0000 [IU] | Freq: Three times a day (TID) | INTRAMUSCULAR | Status: DC

## 2020-12-29 MED ORDER — PREDNISONE 20 MG PO TABS: 50.0000 mg | ORAL_TABLET | Freq: Every day | ORAL | Status: DC

## 2020-12-29 MED ORDER — SODIUM CHLORIDE 0.9 % IV SOLN
1.0000 g | INTRAVENOUS | Status: DC
  Administered 2020-12-30 – 2020-12-31 (×2): 1 g via INTRAVENOUS
  Filled 2020-12-29 (×2): qty 10

## 2020-12-29 MED ORDER — METHYLPREDNISOLONE SODIUM SUCC 40 MG IJ SOLR: 0.5000 mg/kg | Freq: Two times a day (BID) | INTRAMUSCULAR | Status: DC

## 2020-12-29 MED ORDER — FUROSEMIDE 10 MG/ML IJ SOLN
40.0000 mg | Freq: Once | INTRAMUSCULAR | Status: AC
  Administered 2020-12-29: 40 mg via INTRAVENOUS
  Filled 2020-12-29: qty 4

## 2020-12-29 MED ORDER — METHYLPREDNISOLONE SODIUM SUCC 125 MG IJ SOLR
125.0000 mg | Freq: Once | INTRAMUSCULAR | Status: AC
  Administered 2020-12-29: 125 mg via INTRAVENOUS
  Filled 2020-12-29: qty 2

## 2020-12-29 MED ORDER — ACETAMINOPHEN 325 MG PO TABS: 650.0000 mg | ORAL_TABLET | Freq: Once | ORAL | Status: DC

## 2020-12-29 MED ORDER — ACETAMINOPHEN 650 MG RE SUPP
650.0000 mg | Freq: Once | RECTAL | Status: AC
  Administered 2020-12-29: 650 mg via RECTAL
  Filled 2020-12-29: qty 1

## 2020-12-29 NOTE — ED Triage Notes (Signed)
Pt from home with ems, sob, fever, cough, sore throat since last Tuesday, took a hoe covid test yesterday that was positive. Pt unvaccinated. 74% on room air, NRB at 15L applied with ems and increased to 88% Pt alert, confused upon arrival to ed

## 2020-12-29 NOTE — H&P (Addendum)
History and Physical    Patrick Rodriguez. CI:[id number] DOB: 03-01-1925 DOA: 01/05/2021  PCP: Maury Dus, MD Patient coming from: Home  Chief Complaint: Shortness of breath, Covid positive  HPI: Patrick Rodriguez. is a 87 y.o. male with medical history significant of CAD status post PCI in [id number], chronic diastolic heart failure, stroke, carotid artery stenosis status post left CEA in December 2021, COPD, CKD stage IIIb, basal cell carcinoma, myelodysplastic syndrome/myeloproliferative syndrome (with associated leukocytosis, anemia, and thrombocytosis), GERD, nephrolithiasis, hypertension, hyperlipidemia presenting to the ED via EMS with complaints of shortness breath, fever, cough, and sore throat.  He took a home Covid test yesterday which was positive.  Patient is unvaccinated.  He was satting 74% on room air with EMS, sats improved to 88% with 15 L oxygen via NRB.  He was confused on arrival to the ED.  At present, patient continues to be confused.  Oriented to self only and not sure why he is here.  No history could be obtained from him.  ED Course: Temperature 100.5 F.  Not tachycardic or hypotensive.  Tachypneic with respiratory rate in the 30s.  Maintaining sats in the 90s on 15 L HFNC plus NRB.  WBC 41.5 (chronically elevated), hemoglobin 8.9 (chronically low), platelet count 430K (chronically elevated).  Sodium 134, potassium 3.8, chloride 104, bicarb 17, anion gap 13, BUN 72, creatinine 2.2 (was 1.3 a month ago), glucose 121.  Lactic acid 1.7.  INR 1.5.  Blood culture x2 pending.  UA and urine culture pending.  SARS-CoV-2 PCR test pending.  BNP 434.  Chest x-ray showing bilateral pulmonary opacities which may represent edema and/or pneumonia. Patient was given Tylenol, IV Solu-Medrol 125 mg, ceftriaxone, azithromycin, and 1 L normal saline bolus.  ED physician discussed the case with critical care and they felt that the patient did not need ICU admission.  Review of Systems:  All systems  reviewed and apart from history of presenting illness, are negative.  Past Medical History:  Diagnosis Date  . Allergic rhinitis   . Basal cell carcinoma 01/07/2020   left preauricular  . Basal cell carcinoma 06/25/2020   right tip of nose  . Carotid artery stenosis    12/2011:  50-69% on the left and 1-49% on the right;  Carotid US (11/2013): Bilateral 40-59%; repeat 1 year  . Cataract   . CKD (chronic kidney disease)   . COPD (chronic obstructive pulmonary disease) ([place])   . Coronary artery disease    a.  s/p stenting of the LAD in 2000;  b. Lexiscan Myoview (12/2013): No ischemia, EF 70%; normal study  . ED (erectile dysfunction)   . GERD (gastroesophageal reflux disease)   . History of kidney stones    Per patient "had one or two over the years"  . Hyperlipidemia   . Hyperplastic colon polyp 11/15/2013   x3  . Hypertension   . Inguinal hernia    per patient on the right side  . Myelodysplastic syndrome ([place])   . Myocardial infarction ([place])   . Normal echocardiogram Jan 2013   EF of 55% and no wall motion abnormalities  . Normal nuclear stress test 2008  . Obesity   . Palpitations    Holter (11/2013): NSR, sinus brady, PVCs, rare blocked PAC, no sig arrhythmia  . Pneumonia   . Squamous cell carcinoma of skin 01/07/2020   right lat elbow  . Stroke ([place]) 10/20/2020  . Substance abuse ([place])   . Syncope Jan 2013  felt to be vasovagal; had normal echo, carotids ok, 1st degree AV block with mention (but no tracing) of 2nd degree type I AV block while in [place]  . Tubular adenoma 11/15/2013   x2    Past Surgical History:  Procedure Laterality Date  . APPENDECTOMY    . CARDIAC CATHETERIZATION  01/20/1999   EF 60%  . CARDIOVASCULAR STRESS TEST  04/19/2007   EF 74%  . CATARACT EXTRACTION W/ INTRAOCULAR LENS  IMPLANT, BILATERAL    . CORONARY STENT PLACEMENT  2000   LAD  . DOPPLER ECHOCARDIOGRAPHY  02/06/1998   EF 60%  . ENDARTERECTOMY Left 11/19/2020   Procedure: LEFT CAROTID  ENDARTERECTOMY;  Surgeon: Rosetta Posner, MD;  Location: [facility];  Service: Vascular;  Laterality: Left;  . EVACUATION HEMATOMA LEFT NECK (Left Neck) Left 11/19/2020  . HEMATOMA EVACUATION Left 11/19/2020   Procedure: EVACUATION HEMATOMA LEFT NECK;  Surgeon: Rosetta Posner, MD;  Location: [facility];  Service: Vascular;  Laterality: Left;  . PATCH ANGIOPLASTY Left 11/19/2020   Procedure: PATCH ANGIOPLASTY LEFT CAROTID;  Surgeon: Rosetta Posner, MD;  Location: [facility];  Service: Vascular;  Laterality: Left;  . TONSILLECTOMY       reports that he quit smoking about 52 years ago. His smoking use included cigarettes. He has a 56.00 pack-year smoking history. He has never used smokeless tobacco. He reports that he does not drink alcohol and does not use drugs.  Allergies  Allergen Reactions  . Finasteride     Other reaction(s): fuzzy headed  . Hctz [Hydrochlorothiazide] Other (See Comments)    Has had hyponatremia  . Tamsulosin Hcl     Other reaction(s): fuzzy headed    Family History  Problem Relation Age of Onset  . Aneurysm Mother   . Stomach cancer Father   . Cancer Father   . Cancer Sister     Prior to Admission medications   Medication Sig Start Date End Date Taking? Authorizing Provider  amLODipine (NORVASC) 10 MG tablet TAKE 1 TABLET BY MOUTH DAILY Patient taking differently: Take 10 mg by mouth daily. 08/14/20   [place], Peter M, MD  Ascorbic Acid (VITAMIN C) 1000 MG tablet Take 1,000 mg by mouth daily.      [provider]  aspirin 81 MG tablet Take 1 tablet (81 mg total) by mouth daily. 10/22/20   Pokhrel, Corrie Mckusick, MD  atorvastatin (LIPITOR) 40 MG tablet Take 1 tablet (40 mg total) by mouth daily. 10/23/20   Pokhrel, Corrie Mckusick, MD  b complex vitamins tablet Take 1 tablet by mouth daily.      [provider]  dupilumab (DUPIXENT) 300 MG/2ML prefilled syringe Inject 300 mg into the skin every 14 (fourteen) days. Starting at day 15 for maintenance. 12/03/20   Ralene Bathe,  MD  hydroxyurea (HYDREA) 500 MG capsule Take 1 capsule (500 mg total) by mouth daily. May take with food to minimize GI side effects. 10/23/20   Pokhrel, Corrie Mckusick, MD  magnesium gluconate (MAGONATE) 500 MG tablet Take 500 mg by mouth daily.      [provider]  Nattokinase 100 MG CAPS Take 200 mg by mouth daily.     [provider]  omeprazole (PRILOSEC) 40 MG capsule Take 40 mg by mouth daily.    [provider]  traMADol (ULTRAM) 50 MG tablet Take 1 tablet (50 mg total) by mouth every 12 (twelve) hours as needed for severe pain. 11/24/20 11/24/21  Karoline Caldwell, PA-C  TURMERIC PO  Take 2 tablets by mouth daily.     [provider]  VITAMIN E PO Take 1 tablet by mouth daily.    [provider]  zinc gluconate 50 MG tablet Take 50 mg by mouth daily.    [provider]    Physical Exam: Vitals:   12/31/2020 1800 12/16/2020 1830 12/24/2020 1900 01/02/2021 1930  BP: (!) 153/60 137/65 (!) 135/52 (!) 144/50  Pulse: 96 94 76 78  Resp: (!) 31 (!) 35 (!) 31 (!) 25  Temp:      TempSrc:      SpO2: (!) 89% 93% 91% 91%  Weight:      Height:        Physical Exam Constitutional:      Appearance: He is ill-appearing.  HENT:     Head: Normocephalic and atraumatic.  Eyes:     Extraocular Movements: Extraocular movements intact.  Neck:     Comments: JVD present Cardiovascular:     Rate and Rhythm: Normal rate and regular rhythm.     Pulses: Normal pulses.  Pulmonary:     Breath sounds: No wheezing.     Comments: Tachypneic with respiratory rate in the upper 20s to low 30s Increased work of breathing with accessory muscle use Bibasilar rales Abdominal:     General: Bowel sounds are normal. There is no distension.     Palpations: Abdomen is soft.     Tenderness: There is no abdominal tenderness.  Musculoskeletal:        General: No swelling or tenderness.     Cervical back: Normal range of motion and neck supple.  Skin:    General: Skin is  warm and dry.  Neurological:     Mental Status: He is alert.     Comments: Somnolent but easily arousable Oriented to self only, confused     Labs on Admission: I have personally reviewed following labs and imaging studies  CBC: Recent Labs  Lab 12/22/2020 1736 12/18/2020 2101  WBC 41.5*  --   NEUTROABS 39.8*  --   HGB 8.9* 10.2*  HCT 27.3* 30.0*  MCV 86.9  --   PLT 430*  --    Basic Metabolic Panel: Recent Labs  Lab 12/22/2020 1736 12/26/2020 2101  NA 134* 138  K 3.8 4.0  CL 104  --   CO2 17*  --   GLUCOSE 121*  --   BUN 72*  --   CREATININE 2.28*  --   CALCIUM 8.9  --    GFR: Estimated Creatinine Clearance: 18.7 mL/min (A) (by C-G formula based on SCr of 2.28 mg/dL (H)). Liver Function Tests: Recent Labs  Lab 01/02/2021 1736  AST 67*  ALT 41  ALKPHOS 60  BILITOT 1.4*  PROT 6.1*  ALBUMIN 2.6*   No results for input(s): LIPASE, AMYLASE in the last 168 hours. No results for input(s): AMMONIA in the last 168 hours. Coagulation Profile: Recent Labs  Lab 12/30/2020 1736  INR 1.5*   Cardiac Enzymes: No results for input(s): CKTOTAL, CKMB, CKMBINDEX, TROPONINI in the last 168 hours. BNP (last 3 results) No results for input(s): PROBNP in the last 8760 hours. HbA1C: No results for input(s): HGBA1C in the last 72 hours. CBG: No results for input(s): GLUCAP in the last 168 hours. Lipid Profile: No results for input(s): CHOL, HDL, LDLCALC, TRIG, CHOLHDL, LDLDIRECT in the last 72 hours. Thyroid Function Tests: No results for input(s): TSH, T4TOTAL, FREET4, T3FREE, THYROIDAB in the last 72 hours. Anemia Panel:  No results for input(s): VITAMINB12, FOLATE, FERRITIN, TIBC, IRON, RETICCTPCT in the last 72 hours. Urine analysis:    Component Value Date/Time   COLORURINE YELLOW 11/13/2020 1544   APPEARANCEUR HAZY (A) 11/13/2020 1544   LABSPEC 1.017 11/13/2020 1544   PHURINE 5.0 11/13/2020 1544   GLUCOSEU NEGATIVE 11/13/2020 1544   HGBUR NEGATIVE 11/13/2020 1544    BILIRUBINUR NEGATIVE 11/13/2020 1544   BILIRUBINUR neg 06/19/2015 1139   KETONESUR NEGATIVE 11/13/2020 1544   PROTEINUR 100 (A) 11/13/2020 1544   UROBILINOGEN 0.2 06/19/2015 1139   UROBILINOGEN 0.2 08/11/2011 1447   NITRITE NEGATIVE 11/13/2020 1544   LEUKOCYTESUR NEGATIVE 11/13/2020 1544    Radiological Exams on Admission: DG Chest Port 1 View  Result Date: 12/06/2020 CLINICAL DATA:  87 year old male with concern for sepsis. EXAM: PORTABLE CHEST 1 VIEW COMPARISON:  Chest radiograph dated 10/20/2020. FINDINGS: Bilateral pulmonary opacities may represent edema, pneumonia, or combination. Overall slight progression of pulmonary opacities compared to prior radiograph. No pleural effusion pneumothorax. Stable cardiomegaly. Atherosclerotic calcification of the aorta. No acute osseous pathology. IMPRESSION: Slight interval worsening of the bilateral pulmonary opacities compared to prior radiograph. Electronically Signed   By: Anner Crete M.D.   On: 12/10/2020 18:11    EKG: Independently reviewed.  Atrial flutter, rate controlled.  Assessment/Plan Principal Problem:   Pneumonia due to COVID-19 virus Active Problems:   Sepsis ([place])   CHF exacerbation ([place])   Acute hypoxemic respiratory failure ([facility])   AKI (acute kidney injury) ([place])   Sepsis secondary to COVID-19 viral pneumonia: Meets criteria for sepsis-2 SIRS (fever, tachycardia) and SARS-CoV-2 PCR test positive.  No hypotension or lactic acidosis to suggest severe sepsis.  Chest x-ray showing bilateral pulmonary opacities which may represent edema and/or pneumonia.  Per triage note, patient is unvaccinated.  ?Bacterial pneumonia, although leukocytosis is chronic in the setting of MDS/MPS -Remdesivir -IV Solu-Medrol 0.5 mg/kg every 12 hours -Patient is currently confused and no family available, unable to discuss risks versus benefits of immunomodulator therapy. -Continue ceftriaxone and azithromycin.  Check procalcitonin  level. -Vitamin C, zinc, vitamin D -Antitussive as needed -Tylenol as needed -Bronchodilator as needed -Check inflammatory markers including ferritin, fibrinogen, D-dimer, CRP, LDH -Daily CBC with differential, CMP, CRP, D-dimer -Airborne and contact precautions -Incentive spirometry, flutter valve -Encourage prone positioning -Blood culture x2 pending  Acute on chronic diastolic CHF: BNP elevated at 434. Chest x-ray showing bilateral pulmonary opacities which may represent edema and/or pneumonia.  Does have JVD and rales on exam.  Echo done November 2021 showing LVEF of 55 to [id number], grade 2 diastolic dysfunction, mild to moderate mitral regurgitation and severely dilated left atrium. Patient was given 1 L fluid bolus in the ED. -Cardiac monitoring.  Blood pressure stable, IV Lasix 40 mg x 1 ordered.  Monitor intake and output, daily weights.  Low-sodium diet with fluid restriction.  Continue to monitor volume status, may need additional doses of Lasix tomorrow.  Acute hypoxemic respiratory failure: Likely due to combination of COVID-19 viral pneumonia and acute on chronic diastolic CHF.  SPO2 74% on room air with EMS, currently requiring 15 L oxygen via HFNC plus nonrebreather to maintain sats above 90%.  Tachypneic with respiratory rate in the upper 20s to low 30s with accessory muscle use. -Stat ABG ordered.  Continue management of Covid pneumonia and CHF as mentioned above. Continuous pulse ox, supplemental oxygen as needed to keep oxygen saturation above 90%.  Addendum: D-dimer significantly elevated at 16.05.  Will start unfractionated heparin due to concern for  PE given increased risk of thromboembolism with COVID-19 viral infection.  Although his hemoglobin is low, appears stable compared to prior labs.  Will continue to monitor H&H.  Unable to order CT angiogram due to renal insufficiency.  VQ scan and bilateral lower extremity Dopplers ordered.  AKI on CKD stage IIIb: BUN 72, creatinine  2.2 (was 1.3 a month ago). ?Prenal from dehydration in setting of acute viral illness versus cardiorenal from decompensated heart failure. -Patient is being given one-time dose of Lasix due to concern for pulmonary edema based on imaging and elevated BNP.  Repeat labs in the morning to check renal function.  Monitor urine output.  Check urine sodium and creatinine.  Acute encephalopathy: Likely multifactorial due to acute viral illness, hypoxia, and AKI.  Patient is currently somnolent but easily arousable and alert.  Oriented to self only, confused.  Per triage note, he was confused upon arrival to the ED. -Stat head CT and ABG ordered.  Check B12, TSH, and ammonia levels.  Normal anion gap metabolic acidosis: Bicarb 17, anion gap 13.  Likely due to AKI. -Continue to monitor  New onset atrial flutter: Rate controlled.  Likely precipitated by acute viral illness.  -Cardiac monitoring. CHA2DS2VASc 7.  Patient is currently confused and I am not able to have a conversation regarding risks versus benefits of anticoagulation.  Chronically anemic due to history of MDS/MPS.  Discussed with Dr. De Nurse from cardiology -not recommending repeating echocardiogram as he had one done in November 2021.  He recommends having a discussion regarding anticoagulation with the patient later during this hospitalization once his confusion resolves.  Recommends giving an AV nodal blocking agent if rate is uncontrolled.  MDS/MPS: Has chronic leukocytosis, thrombocytosis, and anemia.  At present, WBC 41.5, hemoglobin 8.9, platelet count 430K.  -Followed by Dr. Lorenso Courier from hematology. Daily CBC.  Resume home hydroxyurea after pharmacy med rec is done.  COPD: Stable.  No wheezing. -DuoNeb as needed  Pharmacy med rec pending.  DVT prophylaxis: Lovenox Addendum: Subcutaneous heparin for DVT prophylaxis, NOT Lovenox. Code Status: DNR based on records from prior hospitalization in November 2021.  Per H&P from Dr. Zada Finders  from 10/21/2020 - "Goals of care were discussed with patient and he has made it clear his CODE STATUS is DNR." Family Communication: No family available this time. Disposition Plan: Status is: Inpatient  Remains inpatient appropriate because:IV treatments appropriate due to intensity of illness or inability to take PO, Inpatient level of care appropriate due to severity of illness and Acute hypoxemic respiratory failure secondary to COVID-19 viral pneumonia and decompensated CHF   Dispo: The patient is from: Home              Anticipated d/c is to: SNF              Anticipated d/c date is: > 3 days              Patient currently is not medically stable to d/c.   Difficult to place patient No  The medical decision making on this patient was of high complexity and the patient is at high risk for clinical deterioration, therefore this is a level 3 visit.  Shela Leff MD [facility]  If 7PM-7AM, please contact night-coverage [email or web address]  01/06/21, 9:48 PM

## 2020-12-29 NOTE — ED Notes (Signed)
Patrick Rodriguez of grandson Patrick Rodriguez has been added as a contact [phone]) in the case that Patrick Rodriguez-pt's wife is unavailable. Please contact Patrick Sidles first.

## 2020-12-29 NOTE — ED Provider Notes (Signed)
[facility] Provider Note   CSN: [id number] Arrival date & time: 12/22/2020  1705     History Chief Complaint  Patient presents with  . Covid Positive  . Shortness of Breath    Patrick Havron. is a 87 y.o. male.  Patient complains of cough shortness of breath.  He has been sick for about a week.  Patient was at home and his O2 sats were 74% on room air.  The history is provided by the patient and the EMS personnel. No language interpreter was used.  Shortness of Breath Severity:  Severe Onset quality:  Gradual Timing:  Constant Progression:  Worsening Chronicity:  New Context: activity   Relieved by:  Nothing Ineffective treatments:  None tried Associated symptoms: no abdominal pain, no chest pain, no cough, no headaches and no rash        Past Medical History:  Diagnosis Date  . Allergic rhinitis   . Basal cell carcinoma 01/07/2020   left preauricular  . Basal cell carcinoma 06/25/2020   right tip of nose  . Carotid artery stenosis    12/2011:  50-69% on the left and 1-49% on the right;  Carotid US (11/2013): Bilateral 40-59%; repeat 1 year  . Cataract   . CKD (chronic kidney disease)   . COPD (chronic obstructive pulmonary disease) ([place])   . Coronary artery disease    a.  s/p stenting of the LAD in 2000;  b. Lexiscan Myoview (12/2013): No ischemia, EF 70%; normal study  . ED (erectile dysfunction)   . GERD (gastroesophageal reflux disease)   . History of kidney stones    Per patient "had one or two over the years"  . Hyperlipidemia   . Hyperplastic colon polyp 11/15/2013   x3  . Hypertension   . Inguinal hernia    per patient on the right side  . Myelodysplastic syndrome ([place])   . Myocardial infarction ([place])   . Normal echocardiogram Jan 2013   EF of 55% and no wall motion abnormalities  . Normal nuclear stress test 2008  . Obesity   . Palpitations    Holter (11/2013): NSR, sinus brady, PVCs, rare blocked PAC, no  sig arrhythmia  . Pneumonia   . Squamous cell carcinoma of skin 01/07/2020   right lat elbow  . Stroke ([place]) 10/20/2020  . Substance abuse ([place])   . Syncope Jan 2013   felt to be vasovagal; had normal echo, carotids ok, 1st degree AV block with mention (but no tracing) of 2nd degree type I AV block while in [place]  . Tubular adenoma 11/15/2013   x2    Patient Active Problem List   Diagnosis Date Noted  . Pneumonia 12/28/2020  . Cerebral infarction due to stenosis of left carotid artery ([place]) 11/19/2020  . Left-sided weakness 10/21/2020  . Myelodysplastic syndrome ([place]) 10/21/2020  . Leukocytosis 10/21/2020  . Thrombocytosis 10/21/2020  . Acute respiratory failure with hypoxia ([place]) 10/21/2020  . CKD (chronic kidney disease) stage 3, GFR 30-59 ml/min (HCC) 10/21/2020  . Cerebral embolism with cerebral infarction 10/21/2020  . Cerebrovascular accident (CVA) due to thrombosis of right vertebral artery ([place]) 10/21/2020  . COPD with acute exacerbation ([place]) 12/13/2017  . Esophageal dysphagia ? gerd related 11/11/2014  . (HFpEF) heart failure with preserved ejection fraction ([place]) 12/14/2013  . Anemia 12/14/2013  . Acute post-hemorrhagic anemia 12/04/2013  . Cough 12/03/2013  . Carotid stenosis 11/06/2013  . Psoriasiform dermatitis 09/20/2012  . Edema 05/01/2012  .  Syncope 12/29/2011  . CAD (coronary artery disease) 10/26/2011  . Asthma exacerbation 09/06/2011  . Acute bronchitis 09/06/2011  . Hypercholesterolemia 03/14/2008  . Essential hypertension 03/14/2008  . MYOCARDIAL INFARCTION 03/14/2008  . ALLERGIC RHINITIS 03/14/2008  . COPD GOLD I 03/14/2008  . ANGINA, HX OF 03/14/2008    Past Surgical History:  Procedure Laterality Date  . APPENDECTOMY    . CARDIAC CATHETERIZATION  01/20/1999   EF 60%  . CARDIOVASCULAR STRESS TEST  04/19/2007   EF 74%  . CATARACT EXTRACTION W/ INTRAOCULAR LENS  IMPLANT, BILATERAL    . CORONARY STENT PLACEMENT  2000   LAD  . DOPPLER  ECHOCARDIOGRAPHY  02/06/1998   EF 60%  . ENDARTERECTOMY Left 11/19/2020   Procedure: LEFT CAROTID ENDARTERECTOMY;  Surgeon: Rosetta Posner, MD;  Location: [facility];  Service: Vascular;  Laterality: Left;  . EVACUATION HEMATOMA LEFT NECK (Left Neck) Left 11/19/2020  . HEMATOMA EVACUATION Left 11/19/2020   Procedure: EVACUATION HEMATOMA LEFT NECK;  Surgeon: Rosetta Posner, MD;  Location: [facility];  Service: Vascular;  Laterality: Left;  . PATCH ANGIOPLASTY Left 11/19/2020   Procedure: PATCH ANGIOPLASTY LEFT CAROTID;  Surgeon: Rosetta Posner, MD;  Location: [facility];  Service: Vascular;  Laterality: Left;  . TONSILLECTOMY         Family History  Problem Relation Age of Onset  . Aneurysm Mother   . Stomach cancer Father   . Cancer Father   . Cancer Sister     Social History   Tobacco Use  . Smoking status: Former Smoker    Packs/day: 2.00    Years: 28.00    Pack years: 56.00    Types: Cigarettes    Quit date: 12/06/1968    Years since quitting: 52.0  . Smokeless tobacco: Never Used  Vaping Use  . Vaping Use: Never used  Substance Use Topics  . Alcohol use: No    Alcohol/week: 0.0 standard drinks  . Drug use: No    Home Medications Prior to Admission medications   Medication Sig Start Date End Date Taking? Authorizing Provider  amLODipine (NORVASC) 10 MG tablet TAKE 1 TABLET BY MOUTH DAILY Patient taking differently: Take 10 mg by mouth daily. 08/14/20   [place], Peter M, MD  Ascorbic Acid (VITAMIN C) 1000 MG tablet Take 1,000 mg by mouth daily.      [provider]  aspirin 81 MG tablet Take 1 tablet (81 mg total) by mouth daily. 10/22/20   Pokhrel, Corrie Mckusick, MD  atorvastatin (LIPITOR) 40 MG tablet Take 1 tablet (40 mg total) by mouth daily. 10/23/20   Pokhrel, Corrie Mckusick, MD  b complex vitamins tablet Take 1 tablet by mouth daily.      [provider]  dupilumab (DUPIXENT) 300 MG/2ML prefilled syringe Inject 300 mg into the skin every 14 (fourteen) days. Starting at day 15 for  maintenance. 12/03/20   Ralene Bathe, MD  hydroxyurea (HYDREA) 500 MG capsule Take 1 capsule (500 mg total) by mouth daily. May take with food to minimize GI side effects. 10/23/20   Pokhrel, Corrie Mckusick, MD  magnesium gluconate (MAGONATE) 500 MG tablet Take 500 mg by mouth daily.      [provider]  Nattokinase 100 MG CAPS Take 200 mg by mouth daily.     [provider]  omeprazole (PRILOSEC) 40 MG capsule Take 40 mg by mouth daily.    [provider]  traMADol (ULTRAM) 50 MG tablet Take 1 tablet (50 mg total) by  mouth every 12 (twelve) hours as needed for severe pain. 11/24/20 11/24/21  Baglia, Corrina, PA-C  TURMERIC PO Take 2 tablets by mouth daily.     [provider]  VITAMIN E PO Take 1 tablet by mouth daily.    [provider]  zinc gluconate 50 MG tablet Take 50 mg by mouth daily.    [provider]    Allergies    Finasteride, Hctz [hydrochlorothiazide], and Tamsulosin hcl  Review of Systems   Review of Systems  Constitutional: Negative for appetite change and fatigue.  HENT: Negative for congestion, ear discharge and sinus pressure.   Eyes: Negative for discharge.  Respiratory: Positive for shortness of breath. Negative for cough.   Cardiovascular: Negative for chest pain.  Gastrointestinal: Negative for abdominal pain and diarrhea.  Genitourinary: Negative for frequency and hematuria.  Musculoskeletal: Negative for back pain.  Skin: Negative for rash.  Neurological: Negative for seizures and headaches.  Psychiatric/Behavioral: Negative for hallucinations.    Physical Exam Updated Vital Signs BP (!) 144/50   Pulse 78   Temp (!) 102.5 F (39.2 C) (Rectal)   Resp (!) 25   Ht 5\' 7"  (1.702 m)   Wt 65.3 kg   SpO2 91%   BMI 22.55 kg/m   Physical Exam Vitals and nursing note reviewed.  Constitutional:      Appearance: He is well-developed.  HENT:     Head: Normocephalic.     Nose: Nose normal.  Eyes:      General: No scleral icterus.    Extraocular Movements: EOM normal.     Conjunctiva/sclera: Conjunctivae normal.  Neck:     Thyroid: No thyromegaly.  Cardiovascular:     Rate and Rhythm: Normal rate and regular rhythm.     Heart sounds: No murmur heard. No friction rub. No gallop.   Pulmonary:     Breath sounds: No stridor. No wheezing or rales.     Comments: Tachypnea Chest:     Chest wall: No tenderness.  Abdominal:     General: There is no distension.     Tenderness: There is no abdominal tenderness. There is no rebound.  Musculoskeletal:        General: No edema. Normal range of motion.     Cervical back: Neck supple.  Lymphadenopathy:     Cervical: No cervical adenopathy.  Skin:    General: Skin is warm.     Findings: No erythema or rash.  Neurological:     Mental Status: He is oriented to person, place, and time.     Motor: No abnormal muscle tone.     Coordination: Coordination normal.  Psychiatric:        Mood and Affect: Mood and affect normal.        Behavior: Behavior normal.     ED Results / Procedures / Treatments   Labs (all labs ordered are listed, but only abnormal results are displayed) Labs Reviewed  COMPREHENSIVE METABOLIC PANEL - Abnormal; Notable for the following components:      Result Value   Sodium 134 (*)    CO2 17 (*)    Glucose, Bld 121 (*)    BUN 72 (*)    Creatinine, Ser 2.28 (*)    Total Protein 6.1 (*)    Albumin 2.6 (*)    AST 67 (*)    Total Bilirubin 1.4 (*)    GFR, Estimated 26 (*)    All other components within normal limits  CBC WITH DIFFERENTIAL/PLATELET -  Abnormal; Notable for the following components:   WBC 41.5 (*)    RBC 3.14 (*)    Hemoglobin 8.9 (*)    HCT 27.3 (*)    RDW 20.0 (*)    Platelets 430 (*)    Neutro Abs 39.8 (*)    Lymphs Abs 0.4 (*)    Monocytes Absolute 0.0 (*)    Eosinophils Absolute 0.8 (*)    Abs Immature Granulocytes 0.40 (*)    All other components within normal limits  PROTIME-INR -  Abnormal; Notable for the following components:   Prothrombin Time 17.3 (*)    INR 1.5 (*)    All other components within normal limits  APTT - Abnormal; Notable for the following components:   aPTT 37 (*)    All other components within normal limits  BRAIN NATRIURETIC PEPTIDE - Abnormal; Notable for the following components:   B Natriuretic Peptide 434.9 (*)    All other components within normal limits  CULTURE, BLOOD (SINGLE)  URINE CULTURE  SARS CORONAVIRUS 2 BY RT PCR (HOSPITAL ORDER, [facility])  LACTIC ACID, PLASMA  LACTIC ACID, PLASMA  URINALYSIS, ROUTINE W REFLEX MICROSCOPIC  SAMPLE TO BLOOD BANK    EKG EKG Interpretation  Date/Time:  Monday December 29 2020 17:22:26 EST Ventricular Rate:  83 PR Interval:    QRS Duration: 98 QT Interval:  363 QTC Calculation: 430 R Axis:   82 Text Interpretation: Atrial flutter Borderline right axis deviation Nonspecific T abnormalities, inferior leads Confirmed by Milton Ferguson [phone]) on 12/31/2020 5:49:10 PM   Radiology DG Chest Port 1 View  Result Date: 12/25/2020 CLINICAL DATA:  87 year old male with concern for sepsis. EXAM: PORTABLE CHEST 1 VIEW COMPARISON:  Chest radiograph dated 10/20/2020. FINDINGS: Bilateral pulmonary opacities may represent edema, pneumonia, or combination. Overall slight progression of pulmonary opacities compared to prior radiograph. No pleural effusion pneumothorax. Stable cardiomegaly. Atherosclerotic calcification of the aorta. No acute osseous pathology. IMPRESSION: Slight interval worsening of the bilateral pulmonary opacities compared to prior radiograph. Electronically Signed   By: Anner Crete M.D.   On: 12/12/2020 18:11    Procedures Procedures   Medications Ordered in ED Medications  sodium chloride 0.9 % bolus 1,000 mL (0 mLs Intravenous Stopped 12/16/2020 1947)  cefTRIAXone (ROCEPHIN) 1 g in sodium chloride 0.9 % 100 mL IVPB (0 g Intravenous Stopped 12/26/2020 1930)   azithromycin (ZITHROMAX) 500 mg in sodium chloride 0.9 % 250 mL IVPB (0 mg Intravenous Stopped 12/31/2020 1930)  methylPREDNISolone sodium succinate (SOLU-MEDROL) 125 mg/2 mL injection 125 mg (125 mg Intravenous Given 12/21/2020 1803)  acetaminophen (TYLENOL) suppository 650 mg (650 mg Rectal Given 12/30/2020 1801)    ED Course  I have reviewed the triage vital signs and the nursing notes.  Pertinent labs & imaging results that were available during my care of the patient were reviewed by me and considered in my medical decision making (see chart for details). CRITICAL CARE Performed by: Milton Ferguson Total critical care time:40 minutes Critical care time was exclusive of separately billable procedures and treating other patients. Critical care was necessary to treat or prevent imminent or life-threatening deterioration. Critical care was time spent personally by me on the following activities: development of treatment plan with patient and/or surrogate as well as nursing, discussions with consultants, evaluation of patient's response to treatment, examination of patient, obtaining history from patient or surrogate, ordering and performing treatments and interventions, ordering and review of laboratory studies, ordering and review of radiographic studies,  pulse oximetry and re-evaluation of patient's condition.    MDM Rules/Calculators/A&P                          Patient with hypoxia most likely related to Covid 19.  Patient had a positive test at home recently.  His O2 sats are in the low 90s on a nonrebreather.  He will be admitted to medicine treated for hypoxia Final Clinical Impression(s) / ED Diagnoses Final diagnoses:  Hypoxia    Rx / DC Orders ED Discharge Orders    None       Milton Ferguson, MD 12/31/2020 2003

## 2020-12-29 NOTE — Progress Notes (Signed)
ANTICOAGULATION CONSULT NOTE - Initial Consult  Pharmacy Consult for heparin Indication: new Aflutter and r/o VTE in setting of Covid  Allergies  Allergen Reactions  . Finasteride     Other reaction(s): fuzzy headed  . Hctz [Hydrochlorothiazide] Other (See Comments)    Has had hyponatremia  . Tamsulosin Hcl     Other reaction(s): fuzzy headed    Patient Measurements: Height: 5\' 7"  (170.2 cm) Weight: 65.3 kg (143 lb 15.4 oz) IBW/kg (Calculated) : 66.1  Vital Signs: Temp: 97.7 F (36.5 C) (01/24 2248) Temp Source: Rectal (01/24 2248) BP: 139/58 (01/24 2230) Pulse Rate: 37 (01/24 2200)  Labs: Recent Labs    01-03-21 1736 Jan 03, 2021 2101  HGB 8.9* 10.2*  HCT 27.3* 30.0*  PLT 430*  --   APTT 37*  --   LABPROT 17.3*  --   INR 1.5*  --   CREATININE 2.28*  --     Estimated Creatinine Clearance: 18.7 mL/min (A) (by C-G formula based on SCr of 2.28 mg/dL (H)).   Medical History: Past Medical History:  Diagnosis Date  . Allergic rhinitis   . Basal cell carcinoma 01/07/2020   left preauricular  . Basal cell carcinoma 06/25/2020   right tip of nose  . Carotid artery stenosis    12/2011:  50-69% on the left and 1-49% on the right;  Carotid US (11/2013): Bilateral 40-59%; repeat 1 year  . Cataract   . CKD (chronic kidney disease)   . COPD (chronic obstructive pulmonary disease) ([place])   . Coronary artery disease    a.  s/p stenting of the LAD in 2000;  b. Lexiscan Myoview (12/2013): No ischemia, EF 70%; normal study  . ED (erectile dysfunction)   . GERD (gastroesophageal reflux disease)   . History of kidney stones    Per patient "had one or two over the years"  . Hyperlipidemia   . Hyperplastic colon polyp 11/15/2013   x3  . Hypertension   . Inguinal hernia    per patient on the right side  . Myelodysplastic syndrome ([place])   . Myocardial infarction ([place])   . Normal echocardiogram Jan 2013   EF of 55% and no wall motion abnormalities  . Normal nuclear stress test  2008  . Obesity   . Palpitations    Holter (11/2013): NSR, sinus brady, PVCs, rare blocked PAC, no sig arrhythmia  . Pneumonia   . Squamous cell carcinoma of skin 01/07/2020   right lat elbow  . Stroke ([place]) 10/20/2020  . Substance abuse ([place])   . Syncope Jan 2013   felt to be vasovagal; had normal echo, carotids ok, 1st degree AV block with mention (but no tracing) of 2nd degree type I AV block while in [place]  . Tubular adenoma 11/15/2013   x2    Assessment: 87yo male admitted with multiple issues including Covid PNA, found to be in Aflutter with no initial plan for [facility] given chronic anemia, but D-dimer found to be significantly elevated with concern for VTE in setting of Covid, to begin heparin.  Goal of Therapy:  Heparin level 0.3-0.7 units/ml Monitor platelets by anticoagulation protocol: Yes   Plan:  Will give small heparin bolus of 1000 units given anemia and start heparin gtt at 900 units/hr and monitor heparin levels and CBC.  Wynona Neat, PharmD, BCPS  01-03-2021,11:57 PM

## 2020-12-30 ENCOUNTER — Inpatient Hospital Stay (HOSPITAL_COMMUNITY): Payer: Medicare Other

## 2020-12-30 ENCOUNTER — Encounter (HOSPITAL_COMMUNITY): Payer: Medicare Other

## 2020-12-30 DIAGNOSIS — N179 Acute kidney failure, unspecified: Secondary | ICD-10-CM | POA: Diagnosis not present

## 2020-12-30 DIAGNOSIS — U071 COVID-19: Secondary | ICD-10-CM

## 2020-12-30 DIAGNOSIS — G9341 Metabolic encephalopathy: Secondary | ICD-10-CM | POA: Diagnosis not present

## 2020-12-30 DIAGNOSIS — J9600 Acute respiratory failure, unspecified whether with hypoxia or hypercapnia: Secondary | ICD-10-CM | POA: Diagnosis present

## 2020-12-30 DIAGNOSIS — R7989 Other specified abnormal findings of blood chemistry: Secondary | ICD-10-CM

## 2020-12-30 DIAGNOSIS — J9601 Acute respiratory failure with hypoxia: Secondary | ICD-10-CM

## 2020-12-30 LAB — HEPARIN LEVEL (UNFRACTIONATED)
Heparin Unfractionated: <0.1 IU/mL — ABNORMAL LOW (ref 0.30–0.70)
Heparin Unfractionated: 0.18 IU/mL — ABNORMAL LOW (ref 0.30–0.70)
Heparin Unfractionated: 0.38 IU/mL (ref 0.30–0.70)

## 2020-12-30 LAB — URINALYSIS, ROUTINE W REFLEX MICROSCOPIC
Bilirubin Urine: NEGATIVE
Glucose, UA: NEGATIVE mg/dL
Hgb urine dipstick: NEGATIVE
Ketones, ur: NEGATIVE mg/dL
Leukocytes,Ua: NEGATIVE
Nitrite: NEGATIVE
Protein, ur: 100 mg/dL — AB
Specific Gravity, Urine: 1.013 (ref 1.005–1.030)
pH: 5 (ref 5.0–8.0)

## 2020-12-30 LAB — CBC WITH DIFFERENTIAL/PLATELET
Abs Immature Granulocytes: 1.95 10*3/uL — ABNORMAL HIGH (ref 0.00–0.07)
Basophils Absolute: 0.4 10*3/uL — ABNORMAL HIGH (ref 0.0–0.1)
Basophils Relative: 1 %
Eosinophils Absolute: 0.3 10*3/uL (ref 0.0–0.5)
Eosinophils Relative: 1 %
HCT: 27.2 % — ABNORMAL LOW (ref 39.0–52.0)
Hemoglobin: 8.4 g/dL — ABNORMAL LOW (ref 13.0–17.0)
Immature Granulocytes: 5 %
Lymphocytes Relative: 2 %
Lymphs Abs: 0.8 10*3/uL (ref 0.7–4.0)
MCH: 28.6 pg (ref 26.0–34.0)
MCHC: 30.9 g/dL (ref 30.0–36.0)
MCV: 92.5 fL (ref 80.0–100.0)
Monocytes Absolute: 0.3 10*3/uL (ref 0.1–1.0)
Monocytes Relative: 1 %
Neutro Abs: 33.5 10*3/uL — ABNORMAL HIGH (ref 1.7–7.7)
Neutrophils Relative %: 90 %
Platelets: 383 10*3/uL (ref 150–400)
RBC: 2.94 MIL/uL — ABNORMAL LOW (ref 4.22–5.81)
RDW: 19.9 % — ABNORMAL HIGH (ref 11.5–15.5)
WBC: 37.1 10*3/uL — ABNORMAL HIGH (ref 4.0–10.5)
nRBC: 0 % (ref 0.0–0.2)

## 2020-12-30 LAB — COMPREHENSIVE METABOLIC PANEL
ALT: 36 U/L (ref 0–44)
AST: 56 U/L — ABNORMAL HIGH (ref 15–41)
Albumin: 2.4 g/dL — ABNORMAL LOW (ref 3.5–5.0)
Alkaline Phosphatase: 53 U/L (ref 38–126)
Anion gap: 15 (ref 5–15)
BUN: 77 mg/dL — ABNORMAL HIGH (ref 8–23)
CO2: 15 mmol/L — ABNORMAL LOW (ref 22–32)
Calcium: 8.2 mg/dL — ABNORMAL LOW (ref 8.9–10.3)
Chloride: 105 mmol/L (ref 98–111)
Creatinine, Ser: 2.42 mg/dL — ABNORMAL HIGH (ref 0.61–1.24)
GFR, Estimated: 24 mL/min — ABNORMAL LOW (ref >60)
Glucose, Bld: 199 mg/dL — ABNORMAL HIGH (ref 70–99)
Potassium: 4 mmol/L (ref 3.5–5.1)
Sodium: 135 mmol/L (ref 135–145)
Total Bilirubin: 1.3 mg/dL — ABNORMAL HIGH (ref 0.3–1.2)
Total Protein: 5.4 g/dL — ABNORMAL LOW (ref 6.5–8.1)

## 2020-12-30 LAB — SODIUM, URINE, RANDOM: Sodium, Ur: 41 mmol/L

## 2020-12-30 LAB — C-REACTIVE PROTEIN
CRP: 20.8 mg/dL — ABNORMAL HIGH (ref <1.0)
CRP: 20.2 mg/dL — ABNORMAL HIGH (ref <1.0)

## 2020-12-30 LAB — CREATININE, URINE, RANDOM: Creatinine, Urine: 68.04 mg/dL

## 2020-12-30 LAB — PROCALCITONIN: Procalcitonin: 1.82 ng/mL

## 2020-12-30 LAB — D-DIMER, QUANTITATIVE: D-Dimer, Quant: 16.6 ug/mL-FEU — ABNORMAL HIGH (ref 0.00–0.50)

## 2020-12-30 LAB — FERRITIN: Ferritin: 2037 ng/mL — ABNORMAL HIGH (ref 24–336)

## 2020-12-30 LAB — VITAMIN B12: Vitamin B-12: >7500 pg/mL — ABNORMAL HIGH (ref 180–914)

## 2020-12-30 MED ORDER — SODIUM CHLORIDE 0.45 % IV SOLN: INTRAVENOUS | Status: AC

## 2020-12-30 MED ORDER — FAMOTIDINE 20 MG PO TABS
20.0000 mg | ORAL_TABLET | Freq: Every day | ORAL | Status: DC
  Administered 2020-12-30 – 2020-12-31 (×2): 20 mg via ORAL
  Filled 2020-12-30 (×2): qty 1

## 2020-12-30 MED ORDER — HEPARIN (PORCINE) 25000 UT/250ML-% IV SOLN
1150.0000 [IU]/h | INTRAVENOUS | Status: DC
  Administered 2020-12-30: 1100 [IU]/h via INTRAVENOUS
  Administered 2020-12-30: 900 [IU]/h via INTRAVENOUS
  Administered 2020-12-31: 1150 [IU]/h via INTRAVENOUS
  Filled 2020-12-30 (×5): qty 250

## 2020-12-30 MED ORDER — MOMETASONE FURO-FORMOTEROL FUM 100-5 MCG/ACT IN AERO
2.0000 | INHALATION_SPRAY | Freq: Two times a day (BID) | RESPIRATORY_TRACT | Status: DC
  Administered 2020-12-31 (×3): 2 via RESPIRATORY_TRACT
  Filled 2020-12-30 (×2): qty 8.8

## 2020-12-30 MED ORDER — TECHNETIUM TO 99M ALBUMIN AGGREGATED
4.1000 | Freq: Once | INTRAVENOUS | Status: AC | PRN
  Administered 2020-12-30: 4.1 via INTRAVENOUS

## 2020-12-30 MED ORDER — ASPIRIN EC 81 MG PO TBEC
81.0000 mg | DELAYED_RELEASE_TABLET | Freq: Every day | ORAL | Status: DC
  Administered 2020-12-30 – 2020-12-31 (×2): 81 mg via ORAL
  Filled 2020-12-30 (×2): qty 1

## 2020-12-30 MED ORDER — METHYLPREDNISOLONE SODIUM SUCC 125 MG IJ SOLR
60.0000 mg | Freq: Two times a day (BID) | INTRAMUSCULAR | Status: DC
  Administered 2020-12-30 – 2020-12-31 (×4): 60 mg via INTRAVENOUS
  Filled 2020-12-30 (×4): qty 2

## 2020-12-30 MED ORDER — ATORVASTATIN CALCIUM 40 MG PO TABS
40.0000 mg | ORAL_TABLET | Freq: Every day | ORAL | Status: DC
  Administered 2020-12-30 – 2020-12-31 (×2): 40 mg via ORAL
  Filled 2020-12-30 (×2): qty 1

## 2020-12-30 MED ORDER — HEPARIN BOLUS VIA INFUSION
2000.0000 [IU] | Freq: Once | INTRAVENOUS | Status: AC
  Administered 2020-12-30: 2000 [IU] via INTRAVENOUS
  Filled 2020-12-30: qty 2000

## 2020-12-30 MED ORDER — HEPARIN BOLUS VIA INFUSION
1000.0000 [IU] | Freq: Once | INTRAVENOUS | Status: AC
  Administered 2020-12-30: 1000 [IU] via INTRAVENOUS
  Filled 2020-12-30: qty 1000

## 2020-12-30 MED ORDER — HYDROXYUREA 500 MG PO CAPS
500.0000 mg | ORAL_CAPSULE | Freq: Every day | ORAL | Status: DC
  Administered 2020-12-30 – 2020-12-31 (×2): 500 mg via ORAL
  Filled 2020-12-30 (×3): qty 1

## 2020-12-30 NOTE — ED Notes (Signed)
Attempted to call report to 5W. Was told room was not clean. Given RN number to call back.

## 2020-12-30 NOTE — Progress Notes (Addendum)
PROGRESS NOTE  [name] CI:[id number] DOB: [birth year] DOA: 12/15/2020  PCP: Maury Dus, MD  Brief History/Interval Summary: 87 y.o. male with medical history significant of CAD status post PCI in [id number], chronic diastolic heart failure, stroke, carotid artery stenosis status post left CEA in December 2021, COPD, CKD stage IIIb, basal cell carcinoma, myelodysplastic syndrome/myeloproliferative syndrome (with associated leukocytosis, anemia, and thrombocytosis), GERD, nephrolithiasis, hypertension, hyperlipidemia presenting to the ED via EMS with complaints of shortness breath, fever, cough, and sore throat.  He took a home Covid test which was positive.  Patient is unvaccinated.  He was satting 74% on room air with EMS, sats improved to 88% with 15 L oxygen via NRB.  He was confused on arrival to the ED.  Patient was subsequently hospitalized for further management.  Reason for Visit: Pneumonia due to COVID-19.  Acute respiratory failure with hypoxia.  Consultants: None yet  Procedures: None  Antibiotics: Anti-infectives (From admission, onward)   Start     Dose/Rate Route Frequency Ordered Stop   12/30/20 1800  azithromycin (ZITHROMAX) 500 mg in sodium chloride 0.9 % 250 mL IVPB        500 mg 250 mL/hr over 60 Minutes Intravenous Every 24 hours 12/17/2020 2146     12/30/20 1700  cefTRIAXone (ROCEPHIN) 1 g in sodium chloride 0.9 % 100 mL IVPB        1 g 200 mL/hr over 30 Minutes Intravenous Every 24 hours 12/06/2020 2146     12/30/20 1000  remdesivir 100 mg in sodium chloride 0.9 % 100 mL IVPB       "Followed by" Linked Group Details   100 mg 200 mL/hr over 30 Minutes Intravenous Daily 12/10/2020 2107 01/03/21 0959   12/17/2020 2200  remdesivir 200 mg in sodium chloride 0.9% 250 mL IVPB       "Followed by" Linked Group Details   200 mg 580 mL/hr over 30 Minutes Intravenous Once 12/09/2020 2107 12/30/20 0205   12/12/2020 1745  cefTRIAXone (ROCEPHIN) 1 g in sodium chloride 0.9 % 100 mL IVPB         1 g 200 mL/hr over 30 Minutes Intravenous  Once 12/11/2020 1737 12/30/2020 1930   12/09/2020 1745  azithromycin (ZITHROMAX) 500 mg in sodium chloride 0.9 % 250 mL IVPB        500 mg 250 mL/hr over 60 Minutes Intravenous  Once 12/31/2020 1737 12/19/2020 1930      Subjective/Interval History: Noted to be somewhat distracted.  But he does mention the he is feeling slightly better today compared to yesterday.  Denies any chest pain.  No nausea vomiting.     Assessment/Plan:  Acute Hypoxic Resp. Failure/Pneumonia due to COVID-19/sepsis  Recent Labs  Lab 12/30/2020 1736 12/13/2020 2244 12/30/20 0123  DDIMER  --  16.05* 16.60*  FERRITIN  --  2,037*  --   CRP  --  20.8* 20.2*  ALT 41  --  36  PROCALCITON  --  1.82  --     Objective findings: Fever: Patient was febrile yesterday with a temperature of 102.5 F. Oxygen requirements: Currently on heated high flow nasal cannula along with a nonrebreather.  Saturations noted in the mid 90s.  COVID 19 Therapeutics: Antibacterials: Patient currently on ceftriaxone and azithromycin.  Procalcitonin was 1.82. Remdesivir: Day 2 Steroids: Solu-Medrol Diuretics: None Inhaled Steroids: Will initiate Dulera Actemra/Baricitinib: Not given yet PUD Prophylaxis: Initiate famotidine DVT Prophylaxis: On IV heparin currently  Tested positive on 1/24  From a respiratory  standpoint patient remains tenuous.  He is on high amounts of oxygen.  Inflammatory markers noted to be significantly elevated.  Procalcitonin was also significantly elevated.  Patient was started on antibacterials as well.  He is also on Remdesivir and steroids.  Start inhaled steroids as well.  Lactic acid level noted to be normal.  Oxygen saturations noted in the mid 90s.  Should be able to wean him down.  Due to elevated procalcitonin we will hold off on Actemra or baricitinib.  If his respiratory status worsens then may have to consider it.  Patient encouraged to stay in prone  position as much as possible.  Incentive spirometer.  Mobilization.  ADDENDUM: Discussed with the wife.  She does not want me to continue Remdesivir on this patient based on his wishes.  Patient unable to tell me his wishes since he is confused.  Standard of care for COVID-19 pneumonia was explained to the patient's wife.  She was told that her Remdesivir is commonly used in early onset Covid which he appears to have.  Despite this she does not want medication to be given.  We will discontinue.  She also does not want him to have vancomycin if indicated.  She only wants steroids.  The treatment plan and use of medications and known side effects were discussed with patient/family. Some of the medications used are based on case reports/anecdotal data.  All other medications being used in the management of COVID-19 based on limited study data.  Complete risks and long-term side effects are unknown, however in the best clinical judgment they seem to be of some benefit.  Patient/family wanted to proceed with treatment options provided.  Elevated D-dimer Significantly elevated D-dimer noted.  Patient empirically started on IV heparin.  Doppler study and VQ scan has been ordered.  Chronic diastolic CHF Chest x-ray findings most likely due to pneumonia rather than CHF.  Echocardiogram from November 2021 showed EF to be 55 to 60%.  Grade 2 diastolic dysfunction was noted.  Mild to moderate MR was noted.  Patient was given 1 dose of Lasix yesterday.  Rising creatinine noted.  We will hold off on further doses of Lasix for now.  Assess volume status daily.  Monitor renal function.  Ins and outs.  Daily weights.  Acute kidney injury on chronic kidney disease stage IIIb Baseline creatinine around 1.3.  Presented with elevated creatinine of 2.2.  Patient was given Lasix yesterday with increased to 2.42 today.  BUN was also noted to be significantly elevated.  I believe that the patient is volume depleted.  We will  gently hydrate him for now.  Monitor urine output.  Recheck labs tomorrow.  Acute metabolic encephalopathy Likely multifactorial.  No focal deficits.  CT head without any acute findings.  TSH 1.442.  B12 greater than 7500.  Dysphagia 2 diet.  Speech therapy to evaluate swallow function.  Mildly elevated ammonia level noted likely not of clinical significance.  Normal anion gap metabolic acidosis Likely due to combination of chronic kidney disease as well as acute kidney injury as well as hypovolemia.  New onset atrial flutter Noted at the time of admission.  Likely precipitated by acute viral illness.  This issue was discussed with cardiology overnight.  Patient is on IV heparin.  Heart rate seems to be reasonably well controlled currently.  Not noted to be on any rate control medication currently.  Monitor on telemetry for now.  No need to repeat echocardiogram since he had one recently  in November.  History of myelodysplastic syndrome/myeloproliferative syndrome Chronically elevated WBC noted.  Also has chronic anemia is resolved.  Followed by Dr. Lorenso Courier from hematology.  Monitor counts daily.  Noted to be on hydroxyurea at home.  Reconciliation is still pending.  History of COPD Appears to be stable.    DVT Prophylaxis: On IV heparin currently Code Status: DNR Family Communication: Discussed with wife. Disposition Plan: Hopefully return home when improved  Status is: Inpatient  Remains inpatient appropriate because:Altered mental status, IV treatments appropriate due to intensity of illness or inability to take PO and Inpatient level of care appropriate due to severity of illness   Dispo: The patient is from: Home              Anticipated d/c is to: Home              Anticipated d/c date is: > 3 days              Patient currently is not medically stable to d/c.   Difficult to place patient No      Medications:  Scheduled: . vitamin C  500 mg Oral Daily  . cholecalciferol   1,000 Units Oral Daily  . methylPREDNISolone (SOLU-MEDROL) injection  0.5 mg/kg Intravenous Q12H   Followed by  . [START ON 01/02/2021] predniSONE  50 mg Oral Daily  . zinc sulfate  220 mg Oral Daily   Continuous: . azithromycin    . cefTRIAXone (ROCEPHIN)  IV    . heparin 900 Units/hr (12/30/20 0206)  . remdesivir 100 mg in NS 100 mL     IRJ:JOACZYSAYTKZS, albuterol, guaiFENesin-dextromethorphan   Objective:  Vital Signs  Vitals:   12/30/20 0745 12/30/20 0800 12/30/20 0815 12/30/20 0830  BP:  139/62  (!) 155/60  Pulse: 80 80 67 85  Resp: (!) 24 (!) 29 (!) 34 (!) 22  Temp:      TempSrc:      SpO2: 98% 99% 97% 98%  Weight:      Height:        Intake/Output Summary (Last 24 hours) at 12/30/2020 0912 Last data filed at 12/30/2020 0205 Gross per 24 hour  Intake 1600 ml  Output -  Net 1600 ml   Filed Weights   01-14-2021 1733  Weight: 65.3 kg    General appearance: Awake alert.  In no distress.  Distracted Resp: Noted to be tachypneic.  Coarse breath sounds with crackles bilateral bases.  No wheezing or rhonchi. Cardio: S1-S2 is normal regular.  No S3-S4.  No rubs murmurs or bruit.  Telemetry shows sinus rhythm GI: Abdomen is soft.  Nontender nondistended.  Bowel sounds are present normal.  No masses organomegaly Extremities: No edema.  Full range of motion of lower extremities. Neurologic:  No focal neurological deficits.    Lab Results:  Data Reviewed: I have personally reviewed following labs and imaging studies  CBC: Recent Labs  Lab January 14, 2021 1736 01/14/21 2101 12/30/20 0123  WBC 41.5*  --  37.1*  NEUTROABS 39.8*  --  33.5*  HGB 8.9* 10.2* 8.4*  HCT 27.3* 30.0* 27.2*  MCV 86.9  --  92.5  PLT 430*  --  010    Basic Metabolic Panel: Recent Labs  Lab 01/14/21 1736 2021-01-14 2101 12/30/20 0123  NA 134* 138 135  K 3.8 4.0 4.0  CL 104  --  105  CO2 17*  --  15*  GLUCOSE 121*  --  199*  BUN 72*  --  77*  CREATININE 2.28*  --  2.42*  CALCIUM 8.9  --   8.2*    GFR: Estimated Creatinine Clearance: 17.6 mL/min (A) (by C-G formula based on SCr of 2.42 mg/dL (H)).  Liver Function Tests: Recent Labs  Lab 12/18/2020 1736 12/30/20 0123  AST 67* 56*  ALT 41 36  ALKPHOS 60 53  BILITOT 1.4* 1.3*  PROT 6.1* 5.4*  ALBUMIN 2.6* 2.4*     Recent Labs  Lab 12/27/2020 2244  AMMONIA 41*    Coagulation Profile: Recent Labs  Lab 12/28/2020 1736  INR 1.5*    Thyroid Function Tests: Recent Labs    12/08/2020 2244  TSH 1.442    Anemia Panel: Recent Labs    12/11/2020 2244  VITAMINB12 >7,500*  FERRITIN 2,037*    Recent Results (from the past 240 hour(s))  SARS Coronavirus 2 by RT PCR (hospital order, performed in [place] hospital lab) Nasopharyngeal Nasopharyngeal Swab     Status: Abnormal   Collection Time: 01/04/2021  6:47 PM   Specimen: Nasopharyngeal Swab  Result Value Ref Range Status   SARS Coronavirus 2 POSITIVE (A) NEGATIVE Final    Comment: RESULT CALLED TO, READ BACK BY AND VERIFIED WITH: Shon Hale RN 2008 01/05/2021 A BROWNING (NOTE) SARS-CoV-2 target nucleic acids are DETECTED  SARS-CoV-2 RNA is generally detectable in upper respiratory specimens  during the acute phase of infection.  Positive results are indicative  of the presence of the identified virus, but do not rule out bacterial infection or co-infection with other pathogens not detected by the test.  Clinical correlation with patient history and  other diagnostic information is necessary to determine patient infection status.  The expected result is negative.  Fact Sheet for Patients:   StrictlyIdeas.no   Fact Sheet for Healthcare Providers:   BankingDealers.co.za    This test is not yet approved or cleared by the [place] FDA and  has been authorized for detection and/or diagnosis of SARS-CoV-2 by FDA under an Emergency Use Authorization (EUA).  This EUA will remain in effect (meaning this t est can be  used) for the duration of  the COVID-19 declaration under Section 564(b)(1) of the Act, 21 U.S.C. section 360-bbb-3(b)(1), unless the authorization is terminated or revoked sooner.  Performed at [facility], [place] [address], [place], [place] [address]       Radiology Studies: CT HEAD WO CONTRAST  Result Date: 12/18/2020 CLINICAL DATA:  Encephalopathy.  COVID-19. EXAM: CT HEAD WITHOUT CONTRAST TECHNIQUE: Contiguous axial images were obtained from the base of the skull through the vertex without intravenous contrast. COMPARISON:  10/20/2020 FINDINGS: Brain: There is no mass, hemorrhage or extra-axial collection. The size and configuration of the ventricles and extra-axial CSF spaces are normal. There is hypoattenuation of the white matter, most commonly indicating chronic small vessel disease. Vascular: Atherosclerotic calcification of the internal carotid arteries at the skull base. No abnormal hyperdensity of the major intracranial arteries or dural venous sinuses. Unchanged calcification of the insular segment of the left MCA. Skull: The visualized skull base, calvarium and extracranial soft tissues are normal. Sinuses/Orbits: Mild ethmoid and maxillary sinus disease. Bilateral senescent scleral calcification. IMPRESSION: Chronic small vessel disease without acute intracranial abnormality. Electronically Signed   By: Ulyses Jarred M.D.   On: 12/30/2020 22:28   DG Chest Port 1 View  Result Date: 01/03/2021 CLINICAL DATA:  87 year old male with concern for sepsis. EXAM: PORTABLE CHEST 1 VIEW COMPARISON:  Chest radiograph dated 10/20/2020. FINDINGS: Bilateral pulmonary  opacities may represent edema, pneumonia, or combination. Overall slight progression of pulmonary opacities compared to prior radiograph. No pleural effusion pneumothorax. Stable cardiomegaly. Atherosclerotic calcification of the aorta. No acute osseous pathology. IMPRESSION: Slight interval worsening of the bilateral pulmonary  opacities compared to prior radiograph. Electronically Signed   By: Anner Crete M.D.   On: 2020/12/30 18:11       LOS: 1 day   [facility] Pager on [email or web address]  12/30/2020, 9:12 AM

## 2020-12-30 NOTE — Progress Notes (Signed)
ANTICOAGULATION CONSULT NOTE - Follow Up Consult  Pharmacy Consult for heparin Indication: new Aflutter and r/o VTE in setting of Covid  Allergies  Allergen Reactions  . Finasteride     Other reaction(s): fuzzy headed  . Hctz [Hydrochlorothiazide] Other (See Comments)    Has had hyponatremia  . Tamsulosin Hcl     Other reaction(s): fuzzy headed    Patient Measurements: Height: 5\' 7"  (170.2 cm) Weight: 65.3 kg (143 lb 15.4 oz) IBW/kg (Calculated) : 66.1  Vital Signs: BP: 155/60 (01/25 0830) Pulse Rate: 85 (01/25 0830)  Labs: Recent Labs    01/07/21 1736 01-07-2021 2101 12/30/20 0123 12/30/20 0947  HGB 8.9* 10.2* 8.4*  --   HCT 27.3* 30.0* 27.2*  --   PLT 430*  --  383  --   APTT 37*  --   --   --   LABPROT 17.3*  --   --   --   INR 1.5*  --   --   --   HEPARINUNFRC  --   --  <0.10* 0.18*  CREATININE 2.28*  --  2.42*  --     Estimated Creatinine Clearance: 17.6 mL/min (A) (by C-G formula based on SCr of 2.42 mg/dL (H)).   Medical History: Past Medical History:  Diagnosis Date  . Allergic rhinitis   . Basal cell carcinoma 01/07/2020   left preauricular  . Basal cell carcinoma 06/25/2020   right tip of nose  . Carotid artery stenosis    12/2011:  50-69% on the left and 1-49% on the right;  Carotid US (11/2013): Bilateral 40-59%; repeat 1 year  . Cataract   . CKD (chronic kidney disease)   . COPD (chronic obstructive pulmonary disease) ([place])   . Coronary artery disease    a.  s/p stenting of the LAD in 2000;  b. Lexiscan Myoview (12/2013): No ischemia, EF 70%; normal study  . ED (erectile dysfunction)   . GERD (gastroesophageal reflux disease)   . History of kidney stones    Per patient "had one or two over the years"  . Hyperlipidemia   . Hyperplastic colon polyp 11/15/2013   x3  . Hypertension   . Inguinal hernia    per patient on the right side  . Myelodysplastic syndrome ([place])   . Myocardial infarction ([place])   . Normal echocardiogram Jan 2013   EF of  55% and no wall motion abnormalities  . Normal nuclear stress test 2008  . Obesity   . Palpitations    Holter (11/2013): NSR, sinus brady, PVCs, rare blocked PAC, no sig arrhythmia  . Pneumonia   . Squamous cell carcinoma of skin 01/07/2020   right lat elbow  . Stroke ([place]) 10/20/2020  . Substance abuse ([place])   . Syncope Jan 2013   felt to be vasovagal; had normal echo, carotids ok, 1st degree AV block with mention (but no tracing) of 2nd degree type I AV block while in [place]  . Tubular adenoma 11/15/2013   x2    Assessment: 87yo male admitted with multiple issues including Covid PNA, found to be in Aflutter with no initial plan for [facility] given chronic anemia, but D-dimer found to be significantly elevated with concern for VTE in setting of Covid, started on IV heparin.   Currently on IV heparin at 900 units/hr. Initial HL is subtherapeutic at 0.18. H/H down, Plt wnl. D  Goal of Therapy:  Heparin level 0.3-0.7 units/ml Monitor platelets by anticoagulation protocol: Yes   Plan:  -  Heparin 2000 units IV bolus, then increase IV heparin infusion to 1100 units/hr -F/u 8 hr confirmatory heparin level  -Monitor daily HL, CBC and s/s of bleeding   Albertina Parr, PharmD., BCPS, BCCCP Clinical Pharmacist Please refer to [facility] for unit-specific pharmacist

## 2020-12-30 NOTE — Progress Notes (Signed)
ANTICOAGULATION CONSULT NOTE  Pharmacy Consult:  Heparin Indication: new Aflutter and r/o VTE in setting of Covid  Allergies  Allergen Reactions  . Finasteride     Other reaction(s): fuzzy headed  . Hctz [Hydrochlorothiazide] Other (See Comments)    Has had hyponatremia  . Tamsulosin Hcl     Other reaction(s): fuzzy headed    Patient Measurements: Height: 5\' 7"  (170.2 cm) Weight: 65.3 kg (143 lb 15.4 oz) IBW/kg (Calculated) : 66.1  Vital Signs: BP: 148/53 (01/25 2000) Pulse Rate: 79 (01/25 2000)  Labs: Recent Labs    12/18/2020 1736 12/28/2020 2101 12/30/20 0123 12/30/20 0947 12/30/20 2000  HGB 8.9* 10.2* 8.4*  --   --   HCT 27.3* 30.0* 27.2*  --   --   PLT 430*  --  383  --   --   APTT 37*  --   --   --   --   LABPROT 17.3*  --   --   --   --   INR 1.5*  --   --   --   --   HEPARINUNFRC  --   --  <0.10* 0.18* 0.38  CREATININE 2.28*  --  2.42*  --   --     Estimated Creatinine Clearance: 17.6 mL/min (A) (by C-G formula based on SCr of 2.42 mg/dL (H)).   Assessment: 19 YOM admitted with multiple issues including Covid PNA, found to be in Aflutter with no initial plan for [facility] given chronic anemia, but d-dimer found to be significantly elevated with concern for VTE in setting of Covid, started on IV heparin.   Heparin level therapeutic; no bleeding reported.  Goal of Therapy:  Heparin level 0.3-0.7 units/ml Monitor platelets by anticoagulation protocol: Yes   Plan:  Increase heparin gtt slightly to 1150 units/hr F/U AM labs  Kamera Dubas D. Mina Marble, PharmD, BCPS, [place] 12/30/2020, 9:23 PM

## 2020-12-30 NOTE — ED Notes (Signed)
Pt is in nuclear medicine 

## 2020-12-30 NOTE — ED Notes (Signed)
Patient seating up in bed, being fed. Sp02 remained in low 90s w/ HFNC at 15L

## 2020-12-30 NOTE — Progress Notes (Signed)
Bilateral lower extremity venous study completed.      Please see CV Proc for preliminary results.   [place] Nishi, RVT  

## 2020-12-31 ENCOUNTER — Ambulatory Visit: Payer: Medicare Other | Admitting: Dermatology

## 2020-12-31 DIAGNOSIS — U071 COVID-19: Secondary | ICD-10-CM | POA: Diagnosis not present

## 2020-12-31 DIAGNOSIS — N179 Acute kidney failure, unspecified: Secondary | ICD-10-CM | POA: Diagnosis not present

## 2020-12-31 DIAGNOSIS — J1282 Pneumonia due to coronavirus disease 2019: Secondary | ICD-10-CM | POA: Diagnosis not present

## 2020-12-31 DIAGNOSIS — J9601 Acute respiratory failure with hypoxia: Secondary | ICD-10-CM | POA: Diagnosis not present

## 2020-12-31 LAB — CBC WITH DIFFERENTIAL/PLATELET
Abs Immature Granulocytes: 0 10*3/uL (ref 0.00–0.07)
Basophils Absolute: 0 10*3/uL (ref 0.0–0.1)
Basophils Relative: 0 %
Eosinophils Absolute: 0 10*3/uL (ref 0.0–0.5)
Eosinophils Relative: 0 %
HCT: 22.1 % — ABNORMAL LOW (ref 39.0–52.0)
Hemoglobin: 7.6 g/dL — ABNORMAL LOW (ref 13.0–17.0)
Lymphocytes Relative: 1 %
Lymphs Abs: 0.5 10*3/uL — ABNORMAL LOW (ref 0.7–4.0)
MCH: 29.3 pg (ref 26.0–34.0)
MCHC: 34.4 g/dL (ref 30.0–36.0)
MCV: 85.3 fL (ref 80.0–100.0)
Monocytes Absolute: 0.5 10*3/uL (ref 0.1–1.0)
Monocytes Relative: 1 %
Neutro Abs: 46.3 10*3/uL — ABNORMAL HIGH (ref 1.7–7.7)
Neutrophils Relative %: 98 %
Platelets: 579 10*3/uL — ABNORMAL HIGH (ref 150–400)
RBC: 2.59 MIL/uL — ABNORMAL LOW (ref 4.22–5.81)
RDW: 19.7 % — ABNORMAL HIGH (ref 11.5–15.5)
WBC: 47.2 10*3/uL — ABNORMAL HIGH (ref 4.0–10.5)
nRBC: 0 % (ref 0.0–0.2)
nRBC: 0 /100 WBC

## 2020-12-31 LAB — COMPREHENSIVE METABOLIC PANEL
ALT: 36 U/L (ref 0–44)
AST: 48 U/L — ABNORMAL HIGH (ref 15–41)
Albumin: 2.3 g/dL — ABNORMAL LOW (ref 3.5–5.0)
Alkaline Phosphatase: 67 U/L (ref 38–126)
Anion gap: 14 (ref 5–15)
BUN: 100 mg/dL — ABNORMAL HIGH (ref 8–23)
CO2: 15 mmol/L — ABNORMAL LOW (ref 22–32)
Calcium: 8.2 mg/dL — ABNORMAL LOW (ref 8.9–10.3)
Chloride: 107 mmol/L (ref 98–111)
Creatinine, Ser: 2.58 mg/dL — ABNORMAL HIGH (ref 0.61–1.24)
GFR, Estimated: 23 mL/min — ABNORMAL LOW (ref >60)
Glucose, Bld: 250 mg/dL — ABNORMAL HIGH (ref 70–99)
Potassium: 3.2 mmol/L — ABNORMAL LOW (ref 3.5–5.1)
Sodium: 136 mmol/L (ref 135–145)
Total Bilirubin: 1.1 mg/dL (ref 0.3–1.2)
Total Protein: 5.7 g/dL — ABNORMAL LOW (ref 6.5–8.1)

## 2020-12-31 LAB — PREPARE RBC (CROSSMATCH)

## 2020-12-31 LAB — URINE CULTURE: Culture: NO GROWTH

## 2020-12-31 LAB — PATHOLOGIST SMEAR REVIEW

## 2020-12-31 LAB — D-DIMER, QUANTITATIVE: D-Dimer, Quant: 19.2 ug/mL-FEU — ABNORMAL HIGH (ref 0.00–0.50)

## 2020-12-31 LAB — C-REACTIVE PROTEIN: CRP: 16.8 mg/dL — ABNORMAL HIGH (ref <1.0)

## 2020-12-31 LAB — HEPARIN LEVEL (UNFRACTIONATED): Heparin Unfractionated: 0.43 IU/mL (ref 0.30–0.70)

## 2020-12-31 MED ORDER — RESOURCE THICKENUP CLEAR PO POWD
ORAL | Status: DC | PRN
  Filled 2020-12-31: qty 125

## 2020-12-31 MED ORDER — SODIUM CHLORIDE 0.9% IV SOLUTION: Freq: Once | INTRAVENOUS | Status: AC

## 2020-12-31 MED ORDER — POTASSIUM CHLORIDE CRYS ER 20 MEQ PO TBCR
40.0000 meq | EXTENDED_RELEASE_TABLET | Freq: Four times a day (QID) | ORAL | Status: AC
  Administered 2020-12-31 (×2): 40 meq via ORAL
  Filled 2020-12-31 (×2): qty 2

## 2020-12-31 MED ORDER — PANTOPRAZOLE SODIUM 40 MG PO TBEC
40.0000 mg | DELAYED_RELEASE_TABLET | Freq: Two times a day (BID) | ORAL | Status: DC
  Administered 2020-12-31: 40 mg via ORAL
  Filled 2020-12-31: qty 1

## 2020-12-31 MED ORDER — SODIUM CHLORIDE 0.9 % IV SOLN
100.0000 mg | Freq: Every day | INTRAVENOUS | Status: DC
  Administered 2020-12-31: 100 mg via INTRAVENOUS
  Filled 2020-12-31: qty 20

## 2020-12-31 NOTE — Progress Notes (Signed)
Patient received to the unit. Patient is alert and oriented x2. Iv in place. Skin assessment done with another nurse. Given instructions about call bell and phone. Bed in low position and call bell in reach.

## 2020-12-31 NOTE — Progress Notes (Signed)
PROGRESS NOTE  Patrick Rodriguez. CI:[id number] DOB: 17-Jul-1925 DOA: 12/07/2020  PCP: Maury Dus, MD  Brief History/Interval Summary:   - 87 y.o. male with medical history significant of CAD status post PCI in [id number], chronic diastolic heart failure, stroke, carotid artery stenosis status post left CEA in December 2021, COPD, CKD stage IIIb, basal cell carcinoma, myelodysplastic syndrome/myeloproliferative syndrome (with associated leukocytosis, anemia, and thrombocytosis), GERD, nephrolithiasis, hypertension, hyperlipidemia presenting to the ED via EMS with complaints of shortness breath, fever, cough, and sore throat.  He took a home Covid test which was positive.  Patient is unvaccinated.  He was satting 74% on room air with EMS, sats improved to 88% with 15 L oxygen via NRB.  He was confused on arrival to the ED.  Patient was subsequently hospitalized for further management.  Reason for Visit: Pneumonia due to COVID-19.  Acute respiratory failure with hypoxia.  Consultants: None yet  Procedures: None  Antibiotics: Anti-infectives (From admission, onward)   Start     Dose/Rate Route Frequency Ordered Stop   12/30/20 1800  azithromycin (ZITHROMAX) 500 mg in sodium chloride 0.9 % 250 mL IVPB        500 mg 250 mL/hr over 60 Minutes Intravenous Every 24 hours 12/26/2020 2146     12/30/20 1700  cefTRIAXone (ROCEPHIN) 1 g in sodium chloride 0.9 % 100 mL IVPB        1 g 200 mL/hr over 30 Minutes Intravenous Every 24 hours 12/21/2020 2146     12/30/20 1000  remdesivir 100 mg in sodium chloride 0.9 % 100 mL IVPB  Status:  Discontinued       "Followed by" Linked Group Details   100 mg 200 mL/hr over 30 Minutes Intravenous Daily 12/23/2020 2107 12/30/20 0938   12/18/2020 2200  remdesivir 200 mg in sodium chloride 0.9% 250 mL IVPB       "Followed by" Linked Group Details   200 mg 580 mL/hr over 30 Minutes Intravenous Once 12/16/2020 2107 12/30/20 0205   12/11/2020 1745  cefTRIAXone (ROCEPHIN) 1 g in  sodium chloride 0.9 % 100 mL IVPB        1 g 200 mL/hr over 30 Minutes Intravenous  Once 01/04/2021 1737 12/14/2020 1930   12/31/2020 1745  azithromycin (ZITHROMAX) 500 mg in sodium chloride 0.9 % 250 mL IVPB        500 mg 250 mL/hr over 60 Minutes Intravenous  Once 12/24/2020 1737 12/09/2020 1930      Subjective/Interval History:  Even though he is awake and alert, he is easily distracted, cannot provide reliable complaints, but overall reports part he is feeling better today,  Assessment/Plan:  Acute Hypoxic Resp. Failure/Pneumonia due to COVID-19/sepsis  Recent Labs  Lab 12/21/2020 1736 12/31/2020 2244 12/30/20 0123 12/31/20 0241  DDIMER  --  16.05* 16.60* 19.20*  FERRITIN  --  2,037*  --   --   CRP  --  20.8* 20.2* 16.8*  ALT 41  --  36 36  PROCALCITON  --  1.82  --   --      COVID 19 Therapeutics: Antibacterials: Patient currently on ceftriaxone and azithromycin.  Procalcitonin was 1.82. Remdesivir: Day 2 Steroids: Solu-Medrol Diuretics: None Inhaled Steroids: Will initiate Dulera Actemra/Baricitinib: Not given yet PUD Prophylaxis: Initiate famotidine DVT Prophylaxis: On IV heparin currently  Tested positive on 1/24  From a respiratory standpoint patient remains tenuous.  He is on high amounts of oxygen.  Inflammatory markers noted to be significantly elevated.  Procalcitonin  was also significantly elevated.  Patient was started on antibacterials as well.  He is also on Remdesivir and steroids.  Start inhaled steroids as well.  Lactic acid level noted to be normal.  Oxygen saturations noted in the mid 90s.  Should be able to wean him down.  Due to elevated procalcitonin we will hold off on Actemra or baricitinib.  If his respiratory status worsens then may have to consider it.  Patient encouraged to stay in prone position as much as possible.  Incentive spirometer.  Mobilization. -wife Did refuse Remdesivir initially, but today's during our phone conversation, she informs me  that she is consenting to her husband receiving remdesivir, so it will be started today as she is not declining at no more  The treatment plan and use of medications and known side effects were discussed with patient/family. Some of the medications used are based on case reports/anecdotal data.  All other medications being used in the management of COVID-19 based on limited study data.  Complete risks and long-term side effects are unknown, however in the best clinical judgment they seem to be of some benefit.  Patient/family wanted to proceed with treatment options provided.  Elevated D-dimer Significantly elevated D-dimer noted.  Patient empirically started on IV heparin.  Very low probability of PE, unfortunately CTA could not be obtained given his elevated creatinine, venous Dopplers with no evidence of DVT.  Chronic diastolic CHF Chest x-ray findings most likely due to pneumonia rather than CHF.  Echocardiogram from November 2021 showed EF to be 55 to 60%.  Grade 2 diastolic dysfunction was noted.  Mild to moderate MR was noted.  Patient was given 1 dose of Lasix yesterday.  Rising creatinine noted.  We will hold off on further doses of Lasix for now.  Assess volume status daily.  Monitor renal function.  Ins and outs.  Daily weights.  Acute kidney injury on chronic kidney disease stage IIIb Baseline creatinine around 1.3.  Presented with elevated creatinine of 2.2.  Patient was given Lasix yesterday with increased to 2.42 today.  BUN was also noted to be significantly elevated.  I believe that the patient is volume depleted.  We will gently hydrate him for now.  Monitor urine output.  Creatinine remains elevated at 2.5 today, avoid nephrotoxic medications.  Acute metabolic encephalopathy Likely multifactorial.  No focal deficits.  CT head without any acute findings.  TSH 1.442.  B12 greater than 7500.  Dysphagia 2 diet.  Speech therapy to evaluate swallow function.  Mildly elevated ammonia level  noted likely not of clinical significance.  Normal anion gap metabolic acidosis Likely due to combination of chronic kidney disease as well as acute kidney injury as well as hypovolemia.  New onset atrial flutter Noted at the time of admission.  Likely precipitated by acute viral illness.  This issue was discussed with cardiology overnight.  Patient is on IV heparin.  Heart rate seems to be reasonably well controlled currently.  Not noted to be on any rate control medication currently.  Monitor on telemetry for now.  No need to repeat echocardiogram since he had one recently in November.  History of myelodysplastic syndrome/myeloproliferative syndrome Chronically elevated WBC noted.  Also has chronic anemia is resolved.  Followed by Dr. Lorenso Courier from hematology.  Monitor counts daily.  Noted to be on hydroxyurea at home.  Reconciliation is still pending.  History of COPD Appears to be stable.  Anemia of chronic kidney disease -Hemoglobin at 7.6 today, transfuse 1  unit PRBC, I have discussed with wife and she is agreeable.    DVT Prophylaxis: On IV heparin currently Code Status: DNR Family Communication: Discussed with wife. Disposition Plan: Hopefully return home when improved  Status is: Inpatient  Remains inpatient appropriate because:Altered mental status, IV treatments appropriate due to intensity of illness or inability to take PO and Inpatient level of care appropriate due to severity of illness   Dispo: The patient is from: Home              Anticipated d/c is to: Home              Anticipated d/c date is: > 3 days              Patient currently is not medically stable to d/c.   Difficult to place patient No      Medications:  Scheduled: . vitamin C  500 mg Oral Daily  . aspirin EC  81 mg Oral Daily  . atorvastatin  40 mg Oral Daily  . cholecalciferol  1,000 Units Oral Daily  . famotidine  20 mg Oral Daily  . hydroxyurea  500 mg Oral Daily  . methylPREDNISolone  (SOLU-MEDROL) injection  60 mg Intravenous Q12H  . mometasone-formoterol  2 puff Inhalation BID  . zinc sulfate  220 mg Oral Daily   Continuous: . azithromycin Stopped (12/30/20 2224)  . cefTRIAXone (ROCEPHIN)  IV Stopped (12/30/20 2024)  . heparin 1,150 Units/hr (12/31/20 1422)   LTJ:[id number], albuterol, guaiFENesin-dextromethorphan, Resource ThickenUp Clear   Objective:  Vital Signs  Vitals:   12/31/20 0013 12/31/20 0352 12/31/20 0731 12/31/20 1208  BP: (!) 146/72 136/76 135/74 (!) 123/56  Pulse: 98 92 100 93  Resp: 18 (!) 21 (!) 23 19  Temp: (!) 96.6 F (35.9 C) (!) 97.4 F (36.3 C) (!) 97.2 F (36.2 C) (!) 97.5 F (36.4 C)  TempSrc: Axillary Axillary Axillary Oral  SpO2: 91% 94% 90% 97%  Weight:      Height:        Intake/Output Summary (Last 24 hours) at 12/31/2020 1452 Last data filed at 12/31/2020 1300 Gross per 24 hour  Intake 1152.17 ml  Output 751 ml  Net 401.17 ml   Filed Weights   01/03/2021 1733 12/30/20 2138  Weight: 65.3 kg 61.6 kg     Awake Alert, extremely frail, distracted  Symmetrical Chest wall movement, coarse respiratory sound at the bases RRR,No Gallops,Rubs or new Murmurs, No Parasternal Heave +ve B.Sounds, Abd Soft, No tenderness, No rebound - guarding or rigidity. No Cyanosis, Clubbing or edema, No new Rash or bruise    Lab Results:  Data Reviewed: I have personally reviewed following labs and imaging studies  CBC: Recent Labs  Lab 12/22/2020 1736 12/28/2020 2101 12/30/20 0123 12/31/20 0241  WBC 41.5*  --  37.1* 47.2*  NEUTROABS 39.8*  --  33.5* 46.3*  HGB 8.9* 10.2* 8.4* 7.6*  HCT 27.3* 30.0* 27.2* 22.1*  MCV 86.9  --  92.5 85.3  PLT 430*  --  383 579*    Basic Metabolic Panel: Recent Labs  Lab 12/20/2020 1736 12/21/2020 2101 12/30/20 0123 12/31/20 0241  NA 134* 138 135 136  K 3.8 4.0 4.0 3.2*  CL 104  --  105 107  CO2 17*  --  15* 15*  GLUCOSE 121*  --  199* 250*  BUN 72*  --  77* 100*  CREATININE 2.28*  --  2.42*  2.58*  CALCIUM 8.9  --  8.2* 8.2*  GFR: Estimated Creatinine Clearance: 15.6 mL/min (A) (by C-G formula based on SCr of 2.58 mg/dL (H)).  Liver Function Tests: Recent Labs  Lab 12/13/2020 1736 12/30/20 0123 12/31/20 0241  AST 67* 56* 48*  ALT 41 36 36  ALKPHOS 60 53 67  BILITOT 1.4* 1.3* 1.1  PROT 6.1* 5.4* 5.7*  ALBUMIN 2.6* 2.4* 2.3*     Recent Labs  Lab 12/11/2020 2244  AMMONIA 41*    Coagulation Profile: Recent Labs  Lab 01/05/2021 1736  INR 1.5*    Thyroid Function Tests: Recent Labs    12/11/2020 2244  TSH 1.442    Anemia Panel: Recent Labs    12/25/2020 2244  VITAMINB12 >7,500*  FERRITIN 2,037*    Recent Results (from the past 240 hour(s))  Blood culture (routine single)     Status: None (Preliminary result)   Collection Time: 12/28/2020  5:36 PM   Specimen: BLOOD RIGHT FOREARM  Result Value Ref Range Status   Specimen Description BLOOD RIGHT FOREARM  Final   Special Requests   Final    BOTTLES DRAWN AEROBIC AND ANAEROBIC Blood Culture results may not be optimal due to an inadequate volume of blood received in culture bottles   Culture   Final    NO GROWTH 2 DAYS Performed at [facility], [place] [address], [place], [place] [address]    Report Status PENDING  Incomplete  SARS Coronavirus 2 by RT PCR (hospital order, performed in [facility] hospital lab) Nasopharyngeal Nasopharyngeal Swab     Status: Abnormal   Collection Time: 12/06/2020  6:47 PM   Specimen: Nasopharyngeal Swab  Result Value Ref Range Status   SARS Coronavirus 2 POSITIVE (A) NEGATIVE Final    Comment: RESULT CALLED TO, READ BACK BY AND VERIFIED WITH: Shon Hale RN 2008 12/07/2020 A BROWNING (NOTE) SARS-CoV-2 target nucleic acids are DETECTED  SARS-CoV-2 RNA is generally detectable in upper respiratory specimens  during the acute phase of infection.  Positive results are indicative  of the presence of the identified virus, but do not rule out bacterial infection or co-infection  with other pathogens not detected by the test.  Clinical correlation with patient history and  other diagnostic information is necessary to determine patient infection status.  The expected result is negative.  Fact Sheet for Patients:   StrictlyIdeas.no   Fact Sheet for Healthcare Providers:   BankingDealers.co.za    This test is not yet approved or cleared by the [place] FDA and  has been authorized for detection and/or diagnosis of SARS-CoV-2 by FDA under an Emergency Use Authorization (EUA).  This EUA will remain in effect (meaning this t est can be used) for the duration of  the COVID-19 declaration under Section 564(b)(1) of the Act, 21 U.S.C. section 360-bbb-3(b)(1), unless the authorization is terminated or revoked sooner.  Performed at [facility], [place] [address], [place], [place] [address]   Urine culture     Status: None   Collection Time: 12/30/20  4:02 AM   Specimen: Urine, Clean Catch  Result Value Ref Range Status   Specimen Description URINE, CLEAN CATCH  Final   Special Requests NONE  Final   Culture   Final    NO GROWTH Performed at [facility], [place] [address] [place], [place] [address]    Report Status 12/31/2020 FINAL  Final      Radiology Studies: CT HEAD WO CONTRAST  Result Date: 12/30/2020 CLINICAL DATA:  Encephalopathy.  COVID-19. EXAM:  CT HEAD WITHOUT CONTRAST TECHNIQUE: Contiguous axial images were obtained from the base of the skull through the vertex without intravenous contrast. COMPARISON:  10/20/2020 FINDINGS: Brain: There is no mass, hemorrhage or extra-axial collection. The size and configuration of the ventricles and extra-axial CSF spaces are normal. There is hypoattenuation of the white matter, most commonly indicating chronic small vessel disease. Vascular: Atherosclerotic calcification of the internal carotid arteries at the skull base. No abnormal hyperdensity of the major  intracranial arteries or dural venous sinuses. Unchanged calcification of the insular segment of the left MCA. Skull: The visualized skull base, calvarium and extracranial soft tissues are normal. Sinuses/Orbits: Mild ethmoid and maxillary sinus disease. Bilateral senescent scleral calcification. IMPRESSION: Chronic small vessel disease without acute intracranial abnormality. Electronically Signed   By: Ulyses Jarred M.D.   On: 01-05-21 22:28   NM Pulmonary Perf and Vent  Result Date: 12/30/2020 CLINICAL DATA:  PE suspected, high grade. Shortness of breath, fever, cough, and sore throat. EXAM: NUCLEAR MEDICINE PERFUSION LUNG SCAN TECHNIQUE: Perfusion images were obtained in multiple projections after intravenous injection of radiopharmaceutical. Ventilation scans intentionally deferred if perfusion scan and chest x-ray adequate for interpretation during COVID 19 epidemic. RADIOPHARMACEUTICALS:  4.1 mCi Tc-75m MAA IV COMPARISON:  One view chest x-ray 05-Jan-2021. FINDINGS: No segmental perfusion defects are present. There is some patchy uptake which corresponds with the patient's multifocal airspace disease. IMPRESSION: 1. Very low probability for pulmonary embolus (0-9%) with small perfusion defects smaller than radiographic lesions. Electronically Signed   By: San Morelle M.D.   On: 12/30/2020 11:25   DG Chest Port 1 View  Result Date: 01-05-2021 CLINICAL DATA:  87 year old male with concern for sepsis. EXAM: PORTABLE CHEST 1 VIEW COMPARISON:  Chest radiograph dated 10/20/2020. FINDINGS: Bilateral pulmonary opacities may represent edema, pneumonia, or combination. Overall slight progression of pulmonary opacities compared to prior radiograph. No pleural effusion pneumothorax. Stable cardiomegaly. Atherosclerotic calcification of the aorta. No acute osseous pathology. IMPRESSION: Slight interval worsening of the bilateral pulmonary opacities compared to prior radiograph. Electronically Signed   By:  Anner Crete M.D.   On: 01-05-21 18:11   VAS [place] LOWER EXTREMITY VENOUS (DVT)  Result Date: 12/30/2020  Lower Venous DVT Study Other Indications: Covid, D-Dimer. Risk Factors: None identified. Anticoagulation: Heparin. Comparison Study: No previous venous exams Performing Technologist: Vonzell Schlatter RVT  Examination Guidelines: A complete evaluation includes B-mode imaging, spectral Doppler, color Doppler, and power Doppler as needed of all accessible portions of each vessel. Bilateral testing is considered an integral part of a complete examination. Limited examinations for reoccurring indications may be performed as noted. The reflux portion of the exam is performed with the patient in reverse Trendelenburg.  +---------+---------------+---------+-----------+----------+--------------+ RIGHT    CompressibilityPhasicitySpontaneityPropertiesThrombus Aging +---------+---------------+---------+-----------+----------+--------------+ CFV      Full           Yes      Yes                                 +---------+---------------+---------+-----------+----------+--------------+ SFJ      Full                                                        +---------+---------------+---------+-----------+----------+--------------+ FV Prox  Full                                                        +---------+---------------+---------+-----------+----------+--------------+  FV Mid   Full                                                        +---------+---------------+---------+-----------+----------+--------------+ FV DistalFull                                                        +---------+---------------+---------+-----------+----------+--------------+ PFV      Full                                                        +---------+---------------+---------+-----------+----------+--------------+ POP      Full           Yes      Yes                                  +---------+---------------+---------+-----------+----------+--------------+ PTV      Full                                                        +---------+---------------+---------+-----------+----------+--------------+ PERO     Full                                                        +---------+---------------+---------+-----------+----------+--------------+  Summary: BILATERAL: - No evidence of deep vein thrombosis seen in the lower extremities, bilaterally. -No evidence of popliteal cyst, bilaterally.   *See table(s) above for measurements and observations. Electronically signed by Deitra Mayo MD on 12/30/2020 at 11:26:53 AM.    Final        LOS: 2 days   Phillips Climes MD   [facility] Pager on [email or web address]  12/31/2020, 2:52 PM

## 2020-12-31 NOTE — Evaluation (Signed)
Physical Therapy Evaluation Patient Details Name: Patrick Rodriguez. MRN: [id number] DOB: December 06, 1925 Today's Date: 12/31/2020   History of Present Illness  Pt is a 87 y.o. male with medical history significant of CAD, chronic diastolic heart failure, stroke, carotid artery stenosis status post left CEA in December 2021, COPD, CKD stage IIIb, basal cell carcinoma, myelodysplastic syndrome/myeloproliferative syndrome (with associated leukocytosis, anemia, and thrombocytosis), GERD, nephrolithiasis, hypertension, hyperlipidemia presenting to the ED via EMS with complaints of shortness breath, fever, cough, and sore throat.  He took a home Covid test day prior which was positive.  Patient is unvaccinated. Pt also with confusion upon arrival. He was admitted with Sepsis secondary to COVID-19 viral pneumonia and Acute hypoxemic respiratory failure.  Clinical Impression  Pt admitted with above diagnosis. Pt presenting with confusion during therapy and uanble to provide PLOF. Per prior admission , pt from home and was ambulatory and independent.  Pt currentlyon  15L HFNC and 15 L NRB with VS 90% or greater throughout session but with increased RR and work of breathing with transfers.  He tolerated transfer to chair with min-mod A x 2 for safety with lines, increased O2 needs, and confusion.   Pt currently with functional limitations due to the deficits listed below (see PT Problem List). Pt will benefit from skilled PT to increase their independence and safety with mobility to allow discharge to the venue listed below.       Follow Up Recommendations SNF    Equipment Recommendations  Rolling walker with 5" wheels;3in1 (PT)    Recommendations for Other Services       Precautions / Restrictions Precautions Precautions: Fall Restrictions Weight Bearing Restrictions: No      Mobility  Bed Mobility Overal bed mobility: Needs Assistance Bed Mobility: Supine to Sit     Supine to sit: +2 for  safety/equipment;Mod assist     General bed mobility comments: assist for LEs and trunk elevation, to scoot hips towards EOB. +2 utilized for added support/safety    Transfers Overall transfer level: Needs assistance Equipment used: Rolling walker (2 wheeled);2 person hand held assist Transfers: Sit to/from Stand Sit to Stand: Mod assist;Min assist;+2 physical assistance;+2 safety/equipment         General transfer comment: modA initially when using +2 HHA (via face to face). pt completed additional stand from EOB to RW with improved ability to rise to standing position given boosting/steadying assist.  Ambulation/Gait Ambulation/Gait assistance: Min assist;+2 safety/equipment Gait Distance (Feet): 3 Feet Assistive device: Rolling walker (2 wheeled) Gait Pattern/deviations: Step-to pattern;Decreased stride length;Shuffle Gait velocity: decreased   General Gait Details: steps to chair only with cues and assist wtih RW and to steady  Stairs            Wheelchair Mobility    Modified Rankin (Stroke Patients Only)       Balance Overall balance assessment: Needs assistance Sitting-balance support: Feet supported Sitting balance-Leahy Scale: Fair Sitting balance - Comments: minguard for safety   Standing balance support: Bilateral upper extremity supported;During functional activity Standing balance-Leahy Scale: Poor Standing balance comment: reliant on UE support/external assist;  Pt stood for 2 mins during toileting ADLs with min A for balance and max A ADLs                             Pertinent Vitals/Pain Pain Assessment: No/denies pain Faces Pain Scale: No hurt Pain Intervention(s): Monitored during session    Home Living  Family/patient expects to be discharged to:: Private residence Living Arrangements: Spouse/significant other Available Help at Discharge: Family;Available 24 hours/day Type of Home: House Home Access: Stairs to enter      Home Layout: One level Home Equipment: None Additional Comments: home setup obtained from previous admission in 10/2020    Prior Function Level of Independence: Independent         Comments: pt was independent prior to last admission, during that admission pt was performing ADL and mobility tasks with minguard-supervision without AD per chart     Hand Dominance   Dominant Hand: Right    Extremity/Trunk Assessment   Upper Extremity Assessment Upper Extremity Assessment: Defer to OT evaluation    Lower Extremity Assessment Lower Extremity Assessment: Difficult to assess due to impaired cognition;Generalized weakness    Cervical / Trunk Assessment Cervical / Trunk Assessment: Kyphotic  Communication   Communication: Expressive difficulties (often with mumbled speech)  Cognition Arousal/Alertness: Awake/alert Behavior During Therapy: WFL for tasks assessed/performed Overall Cognitive Status: Impaired/Different from baseline Area of Impairment: Memory;Following commands;Attention;Orientation;Safety/judgement;Problem solving;Awareness                 Orientation Level: Disoriented to;Time;Place;Situation Current Attention Level: Sustained Memory: Decreased short-term memory Following Commands: Follows one step commands consistently;Follows one step commands with increased time Safety/Judgement: Decreased awareness of safety Awareness: Emergent Problem Solving: Slow processing;Requires verbal cues;Requires tactile cues General Comments: pt initially with mumbled speech which improved some as session progressed. he required safety cues as he is quick to attempt to stand from EOB. He had had a BM and was unaware, but was very apologetic for having to get cleand up. mentation appeared to improve as pt was upright for increased amount of time.      General Comments General comments (skin integrity, edema, etc.): Pt on 15 L HFNC and 15 L NRB with O2 sats 91-97% with  questionable pleth at times.  RR 28-40 breaths per min with activity and HR 90-115 bpm throughout session.    Exercises     Assessment/Plan    PT Assessment Patient needs continued PT services  PT Problem List Decreased strength;Decreased mobility;Decreased safety awareness;Decreased range of motion;Decreased coordination;Decreased knowledge of precautions;Decreased activity tolerance;Decreased cognition;Decreased balance;Decreased knowledge of use of DME;Cardiopulmonary status limiting activity       PT Treatment Interventions DME instruction;Therapeutic activities;Gait training;Therapeutic exercise;Patient/family education;Balance training;Functional mobility training    PT Goals (Current goals can be found in the Care Plan section)  Acute Rehab PT Goals Patient Stated Goal: none stated, agreeable to mobilize with therapies PT Goal Formulation: Patient unable to participate in goal setting Time For Goal Achievement: 02/11/21 Potential to Achieve Goals: Good    Frequency Min 2X/week   Barriers to discharge Decreased caregiver support      Co-evaluation PT/OT/SLP Co-Evaluation/Treatment: Yes Reason for Co-Treatment: Complexity of the patient's impairments (multi-system involvement);For patient/therapist safety PT goals addressed during session: Mobility/safety with mobility;Balance OT goals addressed during session: ADL's and self-care       AM-PAC PT "6 Clicks" Mobility  Outcome Measure Help needed turning from your back to your side while in a flat bed without using bedrails?: A Lot Help needed moving from lying on your back to sitting on the side of a flat bed without using bedrails?: A Lot Help needed moving to and from a bed to a chair (including a wheelchair)?: A Little Help needed standing up from a chair using your arms (e.g., wheelchair or bedside chair)?: A Lot Help needed to walk  in hospital room?: A Lot Help needed climbing 3-5 steps with a railing? : A Lot 6  Click Score: 13    End of Session Equipment Utilized During Treatment: Gait belt;Oxygen Activity Tolerance: Patient tolerated treatment well Patient left: with chair alarm set;in chair;with call bell/phone within reach Nurse Communication: Mobility status PT Visit Diagnosis: Unsteadiness on feet (R26.81);Muscle weakness (generalized) (M62.81)    Time: 1107-1130 PT Time Calculation (min) (ACUTE ONLY): 23 min   Charges:   PT Evaluation $PT Eval Moderate Complexity: 1 Melina Schools, PT Acute Rehab Services Pager [phone] [facility] [phone]    Karlton Lemon 12/31/2020, 1:41 PM

## 2020-12-31 NOTE — Evaluation (Signed)
Clinical/Bedside Swallow Evaluation Patient Details  Name: Patrick Rodriguez. MRN: [id number] Date of Birth: 1925/07/03  Today's Date: 12/31/2020 Time: SLP Start Time (ACUTE ONLY): 1100 SLP Stop Time (ACUTE ONLY): 1120 SLP Time Calculation (min) (ACUTE ONLY): 20 min  Past Medical History:  Past Medical History:  Diagnosis Date  . Allergic rhinitis   . Basal cell carcinoma 01/07/2020   left preauricular  . Basal cell carcinoma 06/25/2020   right tip of nose  . Carotid artery stenosis    12/2011:  50-69% on the left and 1-49% on the right;  Carotid US (11/2013): Bilateral 40-59%; repeat 1 year  . Cataract   . CKD (chronic kidney disease)   . COPD (chronic obstructive pulmonary disease) ([place])   . Coronary artery disease    a.  s/p stenting of the LAD in 2000;  b. Lexiscan Myoview (12/2013): No ischemia, EF 70%; normal study  . ED (erectile dysfunction)   . GERD (gastroesophageal reflux disease)   . History of kidney stones    Per patient "had one or two over the years"  . Hyperlipidemia   . Hyperplastic colon polyp 11/15/2013   x3  . Hypertension   . Inguinal hernia    per patient on the right side  . Myelodysplastic syndrome ([place])   . Myocardial infarction ([place])   . Normal echocardiogram Jan 2013   EF of 55% and no wall motion abnormalities  . Normal nuclear stress test 2008  . Obesity   . Palpitations    Holter (11/2013): NSR, sinus brady, PVCs, rare blocked PAC, no sig arrhythmia  . Pneumonia   . Squamous cell carcinoma of skin 01/07/2020   right lat elbow  . Stroke ([place]) 10/20/2020  . Substance abuse ([place])   . Syncope Jan 2013   felt to be vasovagal; had normal echo, carotids ok, 1st degree AV block with mention (but no tracing) of 2nd degree type I AV block while in [place]  . Tubular adenoma 11/15/2013   x2   Past Surgical History:  Past Surgical History:  Procedure Laterality Date  . APPENDECTOMY    . CARDIAC CATHETERIZATION  01/20/1999   EF 60%  . CARDIOVASCULAR  STRESS TEST  04/19/2007   EF 74%  . CATARACT EXTRACTION W/ INTRAOCULAR LENS  IMPLANT, BILATERAL    . CORONARY STENT PLACEMENT  2000   LAD  . DOPPLER ECHOCARDIOGRAPHY  02/06/1998   EF 60%  . ENDARTERECTOMY Left 11/19/2020   Procedure: LEFT CAROTID ENDARTERECTOMY;  Surgeon: Rosetta Posner, MD;  Location: [facility];  Service: Vascular;  Laterality: Left;  . EVACUATION HEMATOMA LEFT NECK (Left Neck) Left 11/19/2020  . HEMATOMA EVACUATION Left 11/19/2020   Procedure: EVACUATION HEMATOMA LEFT NECK;  Surgeon: Rosetta Posner, MD;  Location: [facility];  Service: Vascular;  Laterality: Left;  . PATCH ANGIOPLASTY Left 11/19/2020   Procedure: PATCH ANGIOPLASTY LEFT CAROTID;  Surgeon: Rosetta Posner, MD;  Location: [facility];  Service: Vascular;  Laterality: Left;  . TONSILLECTOMY     HPI:  87 y.o. male with medical history significant of CAD status post PCI in 9562, chronic diastolic heart failure, stroke, carotid artery stenosis status post left CEA in December 2021, COPD, CKD stage IIIb, basal cell carcinoma, myelodysplastic syndrome/myeloproliferative syndrome (with associated leukocytosis, anemia, and thrombocytosis), GERD, nephrolithiasis, hypertension, hyperlipidemia presenting to the ED via EMS with complaints of shortness breath, fever, cough, and sore throat.  He took a home Covid test which was positive.  Patient is unvaccinated.  He was satting 74% on room air with EMS, sats improved to 88% with 15 L oxygen via NRB.   Assessment / Plan / Recommendation Clinical Impression  Pt demonstrates immediate and delayed coughing following sips of thin liquids. He does have a history of dysphagia including silent aspiration on esophagram in 2015. Pt is difficult to understand, but he does report a recent CEA that caused some facial weakness on the left (not observed today). When thickened liquids were trials pt had no coughing though there is still the possiblity of decreased sensation of aspiration. I do not believe an MBS  would be well tolerated given his anxiety and respiratory status. Will downgrade liquids to a more conservative nectar thick texture and monitor for tolerance. SLP Visit Diagnosis: Dysphagia, oropharyngeal phase (R13.12)    Aspiration Risk  Moderate aspiration risk    Diet Recommendation Dysphagia 2 (Fine chop);Nectar-thick liquid   Liquid Administration via: Cup;Straw Medication Administration: Whole meds with puree Supervision: Staff to assist with self feeding Compensations: Slow rate;Small sips/bites Postural Changes: Seated upright at 90 degrees    Other  Recommendations Oral Care Recommendations: Oral care BID Other Recommendations: Order thickener from pharmacy;Have oral suction available   Follow up Recommendations        Frequency and Duration min 2x/week  2 weeks       Prognosis        Swallow Study   General HPI: 87 y.o. male with medical history significant of CAD status post PCI in 9449, chronic diastolic heart failure, stroke, carotid artery stenosis status post left CEA in December 2021, COPD, CKD stage IIIb, basal cell carcinoma, myelodysplastic syndrome/myeloproliferative syndrome (with associated leukocytosis, anemia, and thrombocytosis), GERD, nephrolithiasis, hypertension, hyperlipidemia presenting to the ED via EMS with complaints of shortness breath, fever, cough, and sore throat.  He took a home Covid test which was positive.  Patient is unvaccinated.  He was satting 74% on room air with EMS, sats improved to 88% with 15 L oxygen via NRB. Type of Study: Bedside Swallow Evaluation Previous Swallow Assessment: Esophagram 2015 - silent aspiration Diet Prior to this Study: Dysphagia 2 (chopped);Nectar-thick liquids Temperature Spikes Noted: No Respiratory Status: Nasal cannula;Non-rebreather History of Recent Intubation: No Behavior/Cognition: Alert;Requires cueing Oral Cavity Assessment: Within Functional Limits Oral Care Completed by SLP: No Oral Cavity -  Dentition: Adequate natural dentition Vision: Functional for self-feeding Self-Feeding Abilities: Able to feed self Patient Positioning: Upright in chair Baseline Vocal Quality: Normal Volitional Cough: Strong Volitional Swallow: Able to elicit    Oral/Motor/Sensory Function Overall Oral Motor/Sensory Function: Within functional limits   Ice Chips     Thin Liquid Thin Liquid: Impaired Presentation: Straw Pharyngeal  Phase Impairments: Cough - Delayed;Cough - Immediate    Nectar Thick Nectar Thick Liquid: Within functional limits   Honey Thick Honey Thick Liquid: Not tested   Puree Puree: Within functional limits   Solid     Solid: Within functional limits     Herbie Baltimore, MA [place] Pager [phone] Office [phone]  Lynann Beaver 12/31/2020,2:20 PM

## 2020-12-31 NOTE — Progress Notes (Signed)
Placed patient on heated high flow cannula due to decrease in Sp02 and increased respiratory distress.

## 2020-12-31 NOTE — Progress Notes (Signed)
ANTICOAGULATION CONSULT NOTE  Pharmacy Consult:  Heparin Indication: new Aflutter and r/o VTE in setting of Covid  Allergies  Allergen Reactions  . Finasteride     Other reaction(s): fuzzy headed  . Hctz [Hydrochlorothiazide] Other (See Comments)    Has had hyponatremia  . Tamsulosin Hcl     Other reaction(s): fuzzy headed    Patient Measurements: Height: 5\' 5"  (165.1 cm) Weight: 61.6 kg (135 lb 12.9 oz) IBW/kg (Calculated) : 61.5  Vital Signs: Temp: 97.2 F (36.2 C) (01/26 0731) Temp Source: Axillary (01/26 0731) BP: 135/74 (01/26 0731) Pulse Rate: 100 (01/26 0731)  Labs: Recent Labs    12/18/2020 1736 12/28/2020 2101 01/03/2021 2101 12/30/20 0123 12/30/20 0947 12/30/20 2000 12/31/20 0241  HGB 8.9* 10.2*  --  8.4*  --   --  7.6*  HCT 27.3* 30.0*  --  27.2*  --   --  22.1*  PLT 430*  --   --  383  --   --  579*  APTT 37*  --   --   --   --   --   --   LABPROT 17.3*  --   --   --   --   --   --   INR 1.5*  --   --   --   --   --   --   HEPARINUNFRC  --   --    < > <0.10* 0.18* 0.38 0.43  CREATININE 2.28*  --   --  2.42*  --   --  2.58*   < > = values in this interval not displayed.    Estimated Creatinine Clearance: 15.6 mL/min (A) (by C-G formula based on SCr of 2.58 mg/dL (H)).   Assessment: 24 YOM admitted with multiple issues including Covid PNA, found to be in Aflutter with no initial plan for [facility] given chronic anemia, but d-dimer found to be significantly elevated with concern for VTE in setting of Covid, started on IV heparin.   Heparin level therapeutic at 0.43; no bleeding reported.   Goal of Therapy:  Heparin level 0.3-0.7 units/ml Monitor platelets by anticoagulation protocol: Yes   Plan:  Continue heparin drip at 1150 units/hr Daily Hl and CBC  Dewarren Ledbetter A. Levada Dy, PharmD, BCPS, FNKF Clinical Pharmacist [facility] Please utilize Amion for appropriate phone number to reach the unit pharmacist ([place])   12/31/2020, 8:02 AM

## 2020-12-31 NOTE — Progress Notes (Signed)
Ok to continue remdesivir for the next 4 days per Dr. Waldron [facility].   Onnie Boer, PharmD, BCIDP, AAHIVP, CPP Infectious Disease Pharmacist 12/31/2020 3:14 PM

## 2020-12-31 NOTE — Evaluation (Signed)
Occupational Therapy Evaluation Patient Details Name: Patrick Rodriguez. MRN: [id number] DOB: 1925-02-12 Today's Date: 12/31/2020    History of Present Illness Pt is a 87 y.o. male with medical history significant of CAD, chronic diastolic heart failure, stroke, carotid artery stenosis status post left CEA in December 2021, COPD, CKD stage IIIb, basal cell carcinoma, myelodysplastic syndrome/myeloproliferative syndrome (with associated leukocytosis, anemia, and thrombocytosis), GERD, nephrolithiasis, hypertension, hyperlipidemia presenting to the ED via EMS with complaints of shortness breath, fever, cough, and sore throat.  He took a home Covid test day prior which was positive.  Patient is unvaccinated. Pt also with confusion upon arrival. He was admitted with Sepsis secondary to COVID-19 viral pneumonia and Acute hypoxemic respiratory failure.   Clinical Impression   This 87 y/o male presents with the above. Pt residing at home with spouse PTA, per chart/previous admission in Nov 2021 pt was independent with mobility and ADL. Today pt presenting with generalized weakness, decreased activity tolerance, impaired cognition, and decreased standing balance all impacting his functional performance. Pt requiring two person assist initially for sit<>stand, but as session progressed pt able to perform stand pivot transfer to recliner using RW with minA (+2 safety). Pt requiring up to [place] for posterior pericare and min-maxA for additional ADL. Pt's cognition/awareness appears to improve once OOB and upright in recliner. Session completed on 15L HFNC +NRB (15L), Spo2 overall 91-97% (though difficult to obtain good pleth/O2 reading after transfer completion), RR 28-low 40s and HR 90-115 bpm with activity. Pt to benefit from continued acute OT services and currently recommend SNF level therapies to maximize his overall safety and independence with ADL and mobility.     Follow Up Recommendations   SNF;Supervision/Assistance - 24 hour (pending progress)    Equipment Recommendations  3 in 1 bedside commode;Other (comment) (TBD)           Precautions / Restrictions Precautions Precautions: Fall Restrictions Weight Bearing Restrictions: No      Mobility Bed Mobility Overal bed mobility: Needs Assistance Bed Mobility: Supine to Sit     Supine to sit: Max assist;+2 for safety/equipment     General bed mobility comments: assist for LEs and trunk elevation, to scoot hips towards EOB. +2 utilized for added support/safety    Transfers Overall transfer level: Needs assistance Equipment used: Rolling walker (2 wheeled);2 person hand held assist Transfers: Sit to/from Stand Sit to Stand: Mod assist;Min assist;+2 physical assistance;+2 safety/equipment         General transfer comment: modA initially when using +2 HHA (via face to face). pt completed additional stand from EOB to RW with improved ability to rise to standing position given boosting/steadying assist. pt able to take few steps to recliner with minA (+2 safety).    Balance Overall balance assessment: Needs assistance Sitting-balance support: Feet supported Sitting balance-Leahy Scale: Fair Sitting balance - Comments: minguard for safety   Standing balance support: Bilateral upper extremity supported;During functional activity Standing balance-Leahy Scale: Poor Standing balance comment: reliant on UE support/external assist                           ADL either performed or assessed with clinical judgement   ADL Overall ADL's : Needs assistance/impaired Eating/Feeding: Minimal assistance;Sitting Eating/Feeding Details (indicate cue type and reason): assist to manage NRB initially while taking a sip of his drink. pt with coughing after drinking initially Grooming: Set up;Sitting   Upper Body Bathing: Minimal assistance;Sitting   Lower Body  Bathing: Maximal assistance;+2 for safety/equipment;Sit  to/from stand   Upper Body Dressing : Minimal assistance;Sitting   Lower Body Dressing: Maximal assistance;+2 for safety/equipment;Sit to/from stand   Toilet Transfer: Minimal assistance;+2 for safety/equipment;Stand-pivot;RW Toilet Transfer Details (indicate cue type and reason): simulated via transfer to [place] and Hygiene: Total assistance;+2 for safety/equipment;+2 for physical assistance;Sit to/from stand Toileting - Clothing Manipulation Details (indicate cue type and reason): assist for standing balance while OT assisting with pericare in standing after incontinent BM. pt able to stand approx 2 min for completion of pericare     Functional mobility during ADLs: Minimal assistance;Rolling walker;+2 for safety/equipment;+2 for physical assistance                           Pertinent Vitals/Pain Pain Assessment: Faces Faces Pain Scale: No hurt Pain Intervention(s): Monitored during session     Hand Dominance Right   Extremity/Trunk Assessment Upper Extremity Assessment Upper Extremity Assessment: Generalized weakness   Lower Extremity Assessment Lower Extremity Assessment: Defer to PT evaluation   Cervical / Trunk Assessment Cervical / Trunk Assessment: Kyphotic   Communication Communication Communication: Expressive difficulties (often with mumbled speech)   Cognition Arousal/Alertness: Awake/alert Behavior During Therapy: WFL for tasks assessed/performed Overall Cognitive Status: Impaired/Different from baseline Area of Impairment: Memory;Following commands;Attention;Orientation;Safety/judgement;Problem solving;Awareness                 Orientation Level: Disoriented to;Time;Place;Situation Current Attention Level: Sustained Memory: Decreased short-term memory Following Commands: Follows one step commands consistently;Follows one step commands with increased time Safety/Judgement: Decreased awareness of  safety Awareness: Emergent Problem Solving: Slow processing;Requires verbal cues;Requires tactile cues General Comments: pt initially with mumbled speech which improved some as session progressed. he required safety cues as he is quick to attempt to stand from EOB. He had had a BM and was unaware, but was very apologetic for having to get [place] up. mentation appeared to improve as pt was upright for increased amount of time.   General Comments       Exercises     Shoulder Instructions      Home Living Family/patient expects to be discharged to:: Private residence Living Arrangements: Spouse/significant other Available Help at Discharge: Family;Available 24 hours/day Type of Home: House Home Access: Stairs to enter     Home Layout: One level     Bathroom Shower/Tub: Tub/shower unit;Walk-in shower (uses walk in)   [employer]: Standard     Home Equipment: None   Additional Comments: home setup obtained from previous admission in 10/2020      Prior Functioning/Environment Level of Independence: Independent        Comments: pt was independent prior to last admission, during that admission pt was performing ADL and mobility tasks with minguard-supervision without AD per chart        OT Problem List: Decreased strength;Decreased range of motion;Decreased activity tolerance;Impaired balance (sitting and/or standing);Decreased cognition;Decreased safety awareness;Decreased knowledge of use of DME or AE;Cardiopulmonary status limiting activity      OT Treatment/Interventions: Self-care/ADL training;Therapeutic exercise;Energy conservation;DME and/or AE instruction;Therapeutic activities;Cognitive remediation/compensation;Patient/family education;Balance training    OT Goals(Current goals can be found in the care plan section) Acute Rehab OT Goals Patient Stated Goal: none stated, agreeable to mobilize with therapies OT Goal Formulation: With patient Time For Goal  Achievement: 01/14/21 Potential to Achieve Goals: Good  OT Frequency: Min 2X/week   Barriers to D/C:  Co-evaluation PT/OT/SLP Co-Evaluation/Treatment: Yes Reason for Co-Treatment: For patient/therapist safety;To address functional/ADL transfers   OT goals addressed during session: ADL's and self-care      AM-PAC OT "6 Clicks" Daily Activity     Outcome Measure Help from another person eating meals?: A Little Help from another person taking care of personal grooming?: A Little Help from another person toileting, which includes using toliet, bedpan, or urinal?: Total Help from another person bathing (including washing, rinsing, drying)?: A Lot Help from another person to put on and taking off regular upper body clothing?: A Little Help from another person to put on and taking off regular lower body clothing?: A Lot 6 Click Score: 14   End of Session Equipment Utilized During Treatment: Gait belt;Rolling walker;Oxygen Nurse Communication: Mobility status  Activity Tolerance: Patient tolerated treatment well Patient left: in chair;with call bell/phone within reach;with chair alarm set  OT Visit Diagnosis: Muscle weakness (generalized) (M62.81);Unsteadiness on feet (R26.81);Other symptoms and signs involving cognitive function                Time: 1107-1130 OT Time Calculation (min): 23 min Charges:  OT General Charges $OT Visit: 1 Visit OT Evaluation $OT Eval Moderate Complexity: [place], OT Acute Rehabilitation Services Pager [phone] Office [phone]   Raymondo Band 12/31/2020, 12:14 PM

## 2021-01-01 DIAGNOSIS — E876 Hypokalemia: Secondary | ICD-10-CM

## 2021-01-01 DIAGNOSIS — E872 Acidosis, unspecified: Secondary | ICD-10-CM

## 2021-01-01 DIAGNOSIS — G9341 Metabolic encephalopathy: Secondary | ICD-10-CM

## 2021-01-01 DIAGNOSIS — D696 Thrombocytopenia, unspecified: Secondary | ICD-10-CM

## 2021-01-01 DIAGNOSIS — I4892 Unspecified atrial flutter: Secondary | ICD-10-CM

## 2021-01-01 DIAGNOSIS — I5032 Chronic diastolic (congestive) heart failure: Secondary | ICD-10-CM

## 2021-01-01 DIAGNOSIS — R739 Hyperglycemia, unspecified: Secondary | ICD-10-CM

## 2021-01-01 LAB — CBC WITH DIFFERENTIAL/PLATELET
Abs Immature Granulocytes: 5.93 10*3/uL — ABNORMAL HIGH (ref 0.00–0.07)
Basophils Absolute: 1.1 10*3/uL — ABNORMAL HIGH (ref 0.0–0.1)
Basophils Relative: 2 %
Eosinophils Absolute: 0 10*3/uL (ref 0.0–0.5)
Eosinophils Relative: 0 %
HCT: 26.3 % — ABNORMAL LOW (ref 39.0–52.0)
Hemoglobin: 8.5 g/dL — ABNORMAL LOW (ref 13.0–17.0)
Immature Granulocytes: 10 %
Lymphocytes Relative: 3 %
Lymphs Abs: 1.8 10*3/uL (ref 0.7–4.0)
MCH: 28.7 pg (ref 26.0–34.0)
MCHC: 32.3 g/dL (ref 30.0–36.0)
MCV: 88.9 fL (ref 80.0–100.0)
Monocytes Absolute: 0.2 10*3/uL (ref 0.1–1.0)
Monocytes Relative: 0 %
Neutro Abs: 51.4 10*3/uL — ABNORMAL HIGH (ref 1.7–7.7)
Neutrophils Relative %: 85 %
Platelets: 937 10*3/uL (ref 150–400)
RBC: 2.96 MIL/uL — ABNORMAL LOW (ref 4.22–5.81)
RDW: 19.8 % — ABNORMAL HIGH (ref 11.5–15.5)
WBC: 60.5 10*3/uL (ref 4.0–10.5)
nRBC: 0.1 % (ref 0.0–0.2)

## 2021-01-01 LAB — BLOOD GAS, ARTERIAL
Acid-base deficit: 15.2 mmol/L — ABNORMAL HIGH (ref 0.0–2.0)
Bicarbonate: 11.8 mmol/L — ABNORMAL LOW (ref 20.0–28.0)
FIO2: 100
O2 Saturation: 71.5 %
Patient temperature: 36.3
pCO2 arterial: 33.5 mmHg (ref 32.0–48.0)
pH, Arterial: 7.168 — CL (ref 7.350–7.450)
pO2, Arterial: 46.3 mmHg — ABNORMAL LOW (ref 83.0–108.0)

## 2021-01-01 LAB — BPAM RBC
Blood Product Expiration Date: 202202192359
ISSUE DATE / TIME: 202201261838
Unit Type and Rh: 6200

## 2021-01-01 LAB — COMPREHENSIVE METABOLIC PANEL
ALT: 38 U/L (ref 0–44)
AST: 59 U/L — ABNORMAL HIGH (ref 15–41)
Albumin: 2.4 g/dL — ABNORMAL LOW (ref 3.5–5.0)
Alkaline Phosphatase: 111 U/L (ref 38–126)
Anion gap: 17 — ABNORMAL HIGH (ref 5–15)
BUN: 119 mg/dL — ABNORMAL HIGH (ref 8–23)
CO2: 12 mmol/L — ABNORMAL LOW (ref 22–32)
Calcium: 8 mg/dL — ABNORMAL LOW (ref 8.9–10.3)
Chloride: 111 mmol/L (ref 98–111)
Creatinine, Ser: 3.31 mg/dL — ABNORMAL HIGH (ref 0.61–1.24)
GFR, Estimated: 17 mL/min — ABNORMAL LOW (ref >60)
Glucose, Bld: 333 mg/dL — ABNORMAL HIGH (ref 70–99)
Potassium: 5.2 mmol/L — ABNORMAL HIGH (ref 3.5–5.1)
Sodium: 140 mmol/L (ref 135–145)
Total Bilirubin: 0.6 mg/dL (ref 0.3–1.2)
Total Protein: 6.1 g/dL — ABNORMAL LOW (ref 6.5–8.1)

## 2021-01-01 LAB — TYPE AND SCREEN
ABO/RH(D): A POS
Antibody Screen: NEGATIVE
Unit division: 0

## 2021-01-01 LAB — LACTIC ACID, PLASMA: Lactic Acid, Venous: 5.4 mmol/L (ref 0.5–1.9)

## 2021-01-01 LAB — C-REACTIVE PROTEIN: CRP: 15.4 mg/dL — ABNORMAL HIGH (ref <1.0)

## 2021-01-01 LAB — D-DIMER, QUANTITATIVE: D-Dimer, Quant: 13.65 ug/mL-FEU — ABNORMAL HIGH (ref 0.00–0.50)

## 2021-01-01 LAB — HEPARIN LEVEL (UNFRACTIONATED): Heparin Unfractionated: 0.69 IU/mL (ref 0.30–0.70)

## 2021-01-01 MED ORDER — MORPHINE SULFATE (PF) 2 MG/ML IV SOLN
1.0000 mg | INTRAVENOUS | Status: DC | PRN
  Administered 2021-01-01: 1 mg via INTRAVENOUS
  Filled 2021-01-01: qty 1

## 2021-01-01 MED ORDER — FUROSEMIDE 10 MG/ML IJ SOLN
40.0000 mg | Freq: Once | INTRAMUSCULAR | Status: AC
  Administered 2021-01-01: 40 mg via INTRAVENOUS
  Filled 2021-01-01: qty 4

## 2021-01-01 MED ORDER — SODIUM CHLORIDE 0.9 % IV SOLN: INTRAVENOUS | Status: DC

## 2021-01-02 LAB — PATHOLOGIST SMEAR REVIEW

## 2021-01-03 LAB — CULTURE, BLOOD (SINGLE): Culture: NO GROWTH

## 2021-01-06 NOTE — Progress Notes (Signed)
   12/31/2020 0348  Assess: MEWS Score  BP (!) 127/55  Pulse Rate 96  Resp (!) 32  SpO2 (!) 84 %  O2 Device HFNC;Non-rebreather Mask (15 LPM)  Heater temperature 91.4 F (33 C)  O2 Flow Rate (L/min) (S)  50 L/min  FiO2 (%) 100 %  Assess: MEWS Score  MEWS Temp 0  MEWS Systolic 0  MEWS Pulse 0  MEWS RR 2  MEWS LOC 0  MEWS Score 2  MEWS Score Color Yellow  Assess: if the MEWS score is Yellow or Red  Were vital signs taken at a resting state? Yes  Focused Assessment Change from prior assessment (see assessment flowsheet)  Early Detection of Sepsis Score *See Row Information* Medium  Document  Patient Outcome Not stable and remains on department (provider siad to continue monitoring, attempts to call family member. see progress notes)

## 2021-01-06 NOTE — Progress Notes (Signed)
Patient  87 year old was received from day shift RN. Pt was in bed restless and confused, oxygen saturating in low 80s on HFNC and Nonrebreather. Blood product infusing.  Pt was repositioned and made comfortable.  Patient continued to be hypoxic, RT notified, O2 was increased to heated HFNC with nonrebreather, no change RRT notified, Dr. Cyd Silence notified, Dr.came to see patient, new order implemented, Dr. Cyd Silence tried to contact the family members, spoke with granddaughter, no changes made.  Dr. Cyd Silence said to monitor the patient as patient is a DNR. Critical lab results were given to the the Doctor via chat messages, no new order at this time   Oxygen desaturation was persistent on 69L 100% HHFNC.Will continue to monitor.

## 2021-01-06 NOTE — Progress Notes (Signed)
pts O2 maintaining in the 60s. MD aware, pt currently on 60L 100% heated highflow, and 15L nonrebreather, no new orders at this time. Attempted to call patients spouse and granddaughter with no answer, both sons numbers went straight to voicemail. No other needs at this time, will CTM.

## 2021-01-06 NOTE — Progress Notes (Signed)
Based on ABG the Dr.does not want to put pt on bipap at this time. Pt's nurse stated the Dr. wants to continue to monitor pt. Dr Cyd Silence has tried to contact family.

## 2021-01-06 NOTE — Progress Notes (Signed)
SLP Cancellation Note  Patient Details Name: Patrick Rodriguez. MRN: [id number] DOB: 1925/01/31   Cancelled treatment:       Reason Eval/Treat Not Completed: Medical issues which prohibited therapy. Pts respiratory status has deteriorated. Will follow for needs, but pt unable to participate in therapy at this time.    Adilee Lemme, Katherene Ponto 12/27/2020, 9:33 AM

## 2021-01-06 NOTE — Progress Notes (Signed)
Patient expired at 1125,family at bedside. This RN and Jone Baseman auscultated patient's chest and no breath sounds or heart sounds heard. Dr.Elgergawy notified. 2 gold colored wedding bands given to wife to take home with her. Family prefers to use [employer] manager.

## 2021-01-06 NOTE — Progress Notes (Signed)
   12/31/2020 0031  Assess: MEWS Score  Temp (!) 97.3 F (36.3 C)  Level of Consciousness Alert  Assess: MEWS Score  MEWS Temp 0  MEWS Systolic 0  MEWS Pulse 0  MEWS RR 2  MEWS LOC 0  MEWS Score 2  MEWS Score Color Yellow  Assess: if the MEWS score is Yellow or Red  Were vital signs taken at a resting state? Yes  Focused Assessment No change from prior assessment  Early Detection of Sepsis Score *See Row Information* Medium  MEWS guidelines implemented *See Row Information* Yes  Treat  MEWS Interventions Administered scheduled meds/treatments;Consulted Respiratory Therapy;Escalated (See documentation below)  Notify: Charge Nurse/RN  Name of Charge Nurse/RN Notified Anna, RN  Date Charge Nurse/RN Notified 12/31/20  Time Charge Nurse/RN Notified 2320  Notify: Provider  Provider Name/Title Shalhoub, MD  Date Provider Notified 12/31/20  Time Provider Notified 2330  Notification Type Page  Notification Reason Change in status  Response See new orders  Date of Provider Response 12/31/20  Time of Provider Response 2355  Notify: Rapid Response  Date Rapid Response Notified 12/31/20  Time Rapid Response Notified 2330

## 2021-01-06 NOTE — Death Summary Note (Signed)
DEATH SUMMARY   Patient Details  Name: Patrick LabJames P Jemison Jr. MRN: [id number] DOB: 02/20/1925  Admission/Discharge Information   Admit Date:  12/30/2020  Date of Death: Date of Death: 02-26-2021  Time of Death: Time of Death: 1125  Length of Stay: 3  Referring Physician: Elias Elseeade, Robert, MD   Reason(s) for Hospitalization   Acute hypoxic respiratory failure due to COVID-19 of pneumonia  Diagnoses  Preliminary cause of death:    Acute hypoxic respiratory failure due to COVID-19 of pneumonia  Secondary Diagnoses (including complications and co-morbidities):  Principal Problem:   Pneumonia due to COVID-19 virus Active Problems:   Hypercholesterolemia   Essential hypertension   COPD GOLD I   CAD (coronary artery disease)   Carotid stenosis   Myelodysplastic syndrome (HCC)   Leucocytosis   Thrombocytosis   CKD (chronic kidney disease) stage 3, GFR 30-59 ml/min (HCC)   CHF exacerbation (HCC)   Acute hypoxemic respiratory failure (HCC)   AKI (acute kidney injury) (HCC)   Acute respiratory failure (HCC)   Hypokalemia   Hyperglycemia   Lactic acidosis   Thrombocytopenia (HCC)   Chronic diastolic CHF (congestive heart failure) (HCC)   Acute metabolic encephalopathy   Atrial flutter [facility](HCC)   Brief Hospital Course (including significant findings, care, treatment, and services provided and events leading to death)  Patrick LabJames P Bibbins Jr. is a 87 y.o. year old male who with medical history significant ofCAD status post PCI in 2000, chronic diastolic heart failure, stroke, carotid artery stenosis status post left CEA in December 2021, COPD, CKD stage IIIb, basal cell carcinoma, myelodysplastic syndrome/myeloproliferative syndrome (with associated leukocytosis, anemia, and thrombocytosis), GERD, nephrolithiasis, hypertension, hyperlipidemia presenting to the ED via EMS with complaints of shortness breath, fever, cough, and sore throat. He took a home Covid test which was positive. Patient is  unvaccinated.  Patient with significant hypoxia on presentation, requiring oxygen via high flow nasal cannula and NRB together, as well he is confused, encephalopathic, related to Covid, patient was treated with steroids, and family consented to remdesivir so he was treated with Remdesivir as well, procalcitonin was elevated, so he was covered with antibiotics as well, and he had significantly elevated D-dimers, for which he was empirically started on IV heparin, especially in the setting of new diagnosis of a flutter, his echo done last November significant for EF 55%, grade 2 diastolic dysfunction, was no evidence of volume overload, so no indication indicated initially, as well his work-up significant for AKI in CKD stage IIIb, with baseline creatinine of 1.3, elevated 2.2 on admission, and keep trending up during hospital stay as well, as well patient with known history of medical dysplastic syndrome, and blood work was significant for thrombocytosis, anemia and leukocytosis, he was treated with heparin gtt. for his a flutter, as well patient with underlining COPD, and respiratory status was very tenuous and during hospital stay, he is significantly deconditioned, patient CODE STATUS determined DNR on admission, patient with no improvement in respiratory status, with rapidly escalating oxygen requirement where he did require 60 L heated high flow and 15 L NRB even with that he remains with poor oxygenation, discussed goals of care with family, it is clear that patient won't survive this hospitalization, family including wife and sons were allowed to visit patient, after discussing with the family at bedside, decision has been made to proceed with full comfort measures,  patient passed away 04/15/2021 at 11:25 AM with family members at bedside, they were appreciative for the care [name]  received during this hospital stay, all their questions were answered.    Pertinent Labs and Studies  Significant  Diagnostic Studies CT HEAD WO CONTRAST  Result Date: 12/17/2020 CLINICAL DATA:  Encephalopathy.  COVID-19. EXAM: CT HEAD WITHOUT CONTRAST TECHNIQUE: Contiguous axial images were obtained from the base of the skull through the vertex without intravenous contrast. COMPARISON:  10/20/2020 FINDINGS: Brain: There is no mass, hemorrhage or extra-axial collection. The size and configuration of the ventricles and extra-axial CSF spaces are normal. There is hypoattenuation of the white matter, most commonly indicating chronic small vessel disease. Vascular: Atherosclerotic calcification of the internal carotid arteries at the skull base. No abnormal hyperdensity of the major intracranial arteries or dural venous sinuses. Unchanged calcification of the insular segment of the left MCA. Skull: The visualized skull base, calvarium and extracranial soft tissues are normal. Sinuses/Orbits: Mild ethmoid and maxillary sinus disease. Bilateral senescent scleral calcification. IMPRESSION: Chronic small vessel disease without acute intracranial abnormality. Electronically Signed   By: Ulyses Jarred M.D.   On: 12/12/2020 22:28   NM Pulmonary Perf and Vent  Result Date: 12/30/2020 CLINICAL DATA:  PE suspected, high grade. Shortness of breath, fever, cough, and sore throat. EXAM: NUCLEAR MEDICINE PERFUSION LUNG SCAN TECHNIQUE: Perfusion images were obtained in multiple projections after intravenous injection of radiopharmaceutical. Ventilation scans intentionally deferred if perfusion scan and chest x-ray adequate for interpretation during COVID 19 epidemic. RADIOPHARMACEUTICALS:  4.1 mCi Tc-35m MAA IV COMPARISON:  One view chest x-ray 12/30/2020. FINDINGS: No segmental perfusion defects are present. There is some patchy uptake which corresponds with the patient's multifocal airspace disease. IMPRESSION: 1. Very low probability for pulmonary embolus (0-9%) with small perfusion defects smaller than radiographic lesions.  Electronically Signed   By: San Morelle M.D.   On: 12/30/2020 11:25   DG Chest Port 1 View  Result Date: 01/02/2021 CLINICAL DATA:  87 year old male with concern for sepsis. EXAM: PORTABLE CHEST 1 VIEW COMPARISON:  Chest radiograph dated 10/20/2020. FINDINGS: Bilateral pulmonary opacities may represent edema, pneumonia, or combination. Overall slight progression of pulmonary opacities compared to prior radiograph. No pleural effusion pneumothorax. Stable cardiomegaly. Atherosclerotic calcification of the aorta. No acute osseous pathology. IMPRESSION: Slight interval worsening of the bilateral pulmonary opacities compared to prior radiograph. Electronically Signed   By: Anner Crete M.D.   On: 12/19/2020 18:11   VAS [place] LOWER EXTREMITY VENOUS (DVT)  Result Date: 12/30/2020  Lower Venous DVT Study Other Indications: Covid, D-Dimer. Risk Factors: None identified. Anticoagulation: Heparin. Comparison Study: No previous venous exams Performing Technologist: Vonzell Schlatter RVT  Examination Guidelines: A complete evaluation includes B-mode imaging, spectral Doppler, color Doppler, and power Doppler as needed of all accessible portions of each vessel. Bilateral testing is considered an integral part of a complete examination. Limited examinations for reoccurring indications may be performed as noted. The reflux portion of the exam is performed with the patient in reverse Trendelenburg.  +---------+---------------+---------+-----------+----------+--------------+ RIGHT    CompressibilityPhasicitySpontaneityPropertiesThrombus Aging +---------+---------------+---------+-----------+----------+--------------+ CFV      Full           Yes      Yes                                 +---------+---------------+---------+-----------+----------+--------------+ SFJ      Full                                                         +---------+---------------+---------+-----------+----------+--------------+  FV Prox  Full                                                        +---------+---------------+---------+-----------+----------+--------------+ FV Mid   Full                                                        +---------+---------------+---------+-----------+----------+--------------+ FV DistalFull                                                        +---------+---------------+---------+-----------+----------+--------------+ PFV      Full                                                        +---------+---------------+---------+-----------+----------+--------------+ POP      Full           Yes      Yes                                 +---------+---------------+---------+-----------+----------+--------------+ PTV      Full                                                        +---------+---------------+---------+-----------+----------+--------------+ PERO     Full                                                        +---------+---------------+---------+-----------+----------+--------------+  Summary: BILATERAL: - No evidence of deep vein thrombosis seen in the lower extremities, bilaterally. -No evidence of popliteal cyst, bilaterally.   *See table(s) above for measurements and observations. Electronically signed by Deitra Mayo MD on 12/30/2020 at 11:26:53 AM.    Final     Microbiology Recent Results (from the past 240 hour(s))  Blood culture (routine single)     Status: None (Preliminary result)   Collection Time: 12/11/2020  5:36 PM   Specimen: BLOOD RIGHT FOREARM  Result Value Ref Range Status   Specimen Description BLOOD RIGHT FOREARM  Final   Special Requests   Final    BOTTLES DRAWN AEROBIC AND ANAEROBIC Blood Culture results may not be optimal due to an inadequate volume of blood received in culture bottles   Culture   Final    NO GROWTH 3 DAYS Performed at  [facility], [place] [address], [place], [place] [address]    Report Status PENDING  Incomplete  SARS Coronavirus 2 by RT PCR (hospital  order, performed in [facility] hospital lab) Nasopharyngeal Nasopharyngeal Swab     Status: Abnormal   Collection Time: 12/06/2020  6:47 PM   Specimen: Nasopharyngeal Swab  Result Value Ref Range Status   SARS Coronavirus 2 POSITIVE (A) NEGATIVE Final    Comment: RESULT CALLED TO, READ BACK BY AND VERIFIED WITH: Shon Hale RN 2008 12/06/2020 A BROWNING (NOTE) SARS-CoV-2 target nucleic acids are DETECTED  SARS-CoV-2 RNA is generally detectable in upper respiratory specimens  during the acute phase of infection.  Positive results are indicative  of the presence of the identified virus, but do not rule out bacterial infection or co-infection with other pathogens not detected by the test.  Clinical correlation with patient history and  other diagnostic information is necessary to determine patient infection status.  The expected result is negative.  Fact Sheet for Patients:   StrictlyIdeas.no   Fact Sheet for Healthcare Providers:   BankingDealers.co.za    This test is not yet approved or cleared by the [place] FDA and  has been authorized for detection and/or diagnosis of SARS-CoV-2 by FDA under an Emergency Use Authorization (EUA).  This EUA will remain in effect (meaning this t est can be used) for the duration of  the COVID-19 declaration under Section 564(b)(1) of the Act, 21 U.S.C. section 360-bbb-3(b)(1), unless the authorization is terminated or revoked sooner.  Performed at [facility], [place] [address], [place], [place] [address]   Urine culture     Status: None   Collection Time: 12/30/20  4:02 AM   Specimen: Urine, Clean Catch  Result Value Ref Range Status   Specimen Description URINE, CLEAN CATCH  Final   Special Requests NONE  Final   Culture   Final    NO GROWTH Performed  at [facility], [place] [address], [place], [place] [address]    Report Status 12/31/2020 FINAL  Final    Lab Basic Metabolic Panel: Recent Labs  Lab 12/27/2020 1736 12/14/2020 2101 12/30/20 0123 12/31/20 0241 2021/01/04 0159  NA 134* 138 135 136 140  K 3.8 4.0 4.0 3.2* 5.2*  CL 104  --  105 107 111  CO2 17*  --  15* 15* 12*  GLUCOSE 121*  --  199* 250* 333*  BUN 72*  --  77* 100* 119*  CREATININE 2.28*  --  2.42* 2.58* 3.31*  CALCIUM 8.9  --  8.2* 8.2* 8.0*   Liver Function Tests: Recent Labs  Lab 12/14/2020 1736 12/30/20 0123 12/31/20 0241 01/04/21 0159  AST 67* 56* 48* 59*  ALT 41 36 36 38  ALKPHOS 60 53 67 111  BILITOT 1.4* 1.3* 1.1 0.6  PROT 6.1* 5.4* 5.7* 6.1*  ALBUMIN 2.6* 2.4* 2.3* 2.4*   No results for input(s): LIPASE, AMYLASE in the last 168 hours. Recent Labs  Lab 12/15/2020 2244  AMMONIA 41*   CBC: Recent Labs  Lab 12/17/2020 1736 12/26/2020 2101 12/30/20 0123 12/31/20 0241 01-04-2021 0159  WBC 41.5*  --  37.1* 47.2* 60.5*  NEUTROABS 39.8*  --  33.5* 46.3* 51.4*  HGB 8.9* 10.2* 8.4* 7.6* 8.5*  HCT 27.3* 30.0* 27.2* 22.1* 26.3*  MCV 86.9  --  92.5 85.3 88.9  PLT 430*  --  383 579* 937*   Cardiac Enzymes: No results for input(s): CKTOTAL, CKMB, CKMBINDEX, TROPONINI in the last 168 hours. Sepsis Labs: Recent Labs  Lab 12/28/2020 1736 01/02/2021 2244 12/30/20 0123 12/31/20 0241 January 04, 2021 0159 2021-01-04 0202  PROCALCITON  --  1.82  --   --   --   --   WBC 41.5*  --  37.1* 47.2* 60.5*  --   LATICACIDVEN 1.7 1.3  --   --   --  5.4*    Procedures/Operations    Karrah Mangini 30-Jan-2021, 6:15 PM

## 2021-01-06 NOTE — Progress Notes (Addendum)
HOSPITAL MEDICINE OVERNIGHT EVENT NOTE    Notified by nursing that patient's respiratory status is worsening.  Patient is exhibiting severe respiratory distress with oxygen saturations downtrending.  Patient was transitioned to heated high flow oxygen delivery earlier in the shift and continues to exhibit oxygen saturations around 80%.  Patient is currently admitted for severe hypoxic respiratory failure secondary to Covid infection complicated by acute kidney injury and acute metabolic encephalopathy.  I have evaluated the patient at the bedside.  Patient is visibly in respiratory distress with evidence of accessory muscle use.  Is indeed saturating around 80% with a good waveform.  Patient does not speak but is able to nod and shake his head in response to questions.  Patient actually denies being short of breath.  Breath sounds reveal somewhat coarse breath sounds at the bases with bibasilar rales and minimal intermittent mild expiratory wheezing.  No evidence of cardiogenic volume overload.  No evidence of JVD or peripheral edema.  Chart reviewed, patient is currently a DNR and DNI.   I have attempted to call the spouse via both the home phone and her cell phone which I have left a voicemail.  I have also attempted to call the granddaughter and have been unable to get an answer.  I have asked respiratory to obtain an ABG, will consider initiation of BiPAP based on results.  Close monitoring going forward, prognosis is worsening.   Patrick Emerald  MD [facility]   ADDENDUM 2:30AM  ABG reviewed.  7.17, PCO2 of 33.5, PO2 of 46.3.  Suggestive of severe hypoxic respiratory failure with likely progressive metabolic acidosis.  I followed this up with a stat lactic acid and found the lactate to be 5.4 -  likely due to end organ hypoperfusion from persisting hypoxia.  Based on the results above, I do not believe patient would benefit from initiation of BiPAP.  Also, as mentioned above  patient is a DNR/DNI.  I have reviewed the patient's case with Dr. Lucile Shutters with CCM.  He agrees that patient would likely not benefit from BiPAP, nor would the patient benefit from transfer to the intensive care unit.  Considering recent packed red blood cell transfusion yesterday evening, will attempt a trial dose of 40 mg of IV Lasix.  Additionally, patient is on an ongoing heparin infusion for markedly elevated D-dimer in case of an acute thromboembolism.  Continuing high flow oxygen delivery.  Continue remainder of treatment plan as per daytime provider notes.  Prognosis is extremely poor at this point with high risk of further clinical deterioration and death.  I have attempted to contact the wife on 4 separate occasions and have left a voicemail.  I did finally succeeded in speaking to the granddaughter who then provided me with the phone numbers for both of the sons.  I have additionally attempted to contact each of the sons, neither of which answered and I have left voicemails for both.  Patrick Rodriguez

## 2021-01-06 NOTE — Progress Notes (Signed)
ANTICOAGULATION CONSULT NOTE  Pharmacy Consult:  Heparin Indication: new Aflutter and r/o VTE in setting of Covid  Allergies  Allergen Reactions  . Finasteride     Other reaction(s): fuzzy headed  . Hctz [Hydrochlorothiazide] Other (See Comments)    Has had hyponatremia  . Tamsulosin Hcl     Other reaction(s): fuzzy headed    Patient Measurements: Height: 5\' 5"  (165.1 cm) Weight: 61.7 kg (136 lb 0.4 oz) IBW/kg (Calculated) : 61.5  Vital Signs: Temp: 97.4 F (36.3 C) (01/27 0346) Temp Source: Axillary (01/27 0346) BP: 127/55 (01/27 0348) Pulse Rate: 96 (01/27 0348)  Labs: Recent Labs    01/04/2021 1736 01/02/2021 2101 12/30/20 0123 12/30/20 0947 12/30/20 2000 12/31/20 0241 12/17/2020 0159  HGB 8.9*   < > 8.4*  --   --  7.6* 8.5*  HCT 27.3*   < > 27.2*  --   --  22.1* 26.3*  PLT 430*  --  383  --   --  579* 937*  APTT 37*  --   --   --   --   --   --   LABPROT 17.3*  --   --   --   --   --   --   INR 1.5*  --   --   --   --   --   --   HEPARINUNFRC  --   --  <0.10*   < > 0.38 0.43 0.69  CREATININE 2.28*  --  2.42*  --   --  2.58* 3.31*   < > = values in this interval not displayed.    Estimated Creatinine Clearance: 12.1 mL/min (A) (by C-G formula based on SCr of 3.31 mg/dL (H)).   Assessment: 89 YOM admitted with multiple issues including Covid PNA, found to be in Aflutter with no initial plan for [facility] given chronic anemia, but d-dimer found to be significantly elevated with concern for VTE in setting of Covid, started on IV heparin.   Heparin level therapeutic at 0.69; no bleeding reported.   Goal of Therapy:  Heparin level 0.3-0.7 units/ml Monitor platelets by anticoagulation protocol: Yes   Plan:  Continue heparin drip at 1150 units/hr Daily Hl and CBC  Ryer Asato A. Levada Dy, PharmD, BCPS, FNKF Clinical Pharmacist [place] Please utilize Amion for appropriate phone number to reach the unit pharmacist ([place])   12/13/2020, 7:32 AM

## 2021-01-06 DEATH — deceased

## 2021-01-12 ENCOUNTER — Ambulatory Visit: Payer: Medicare Other | Admitting: Hematology and Oncology

## 2021-01-12 ENCOUNTER — Other Ambulatory Visit: Payer: Medicare Other

## 2021-03-06 DEATH — deceased

## 2021-09-07 IMAGING — NM NM PULMONARY VENT & PERF
8 series · 8 of 8 positions shown · non-contrast
Comparison: One view chest x-ray 12/29/2020.

CLINICAL DATA: PE suspected, high grade. Shortness of breath,
fever, cough, and sore throat.

EXAM:
NUCLEAR MEDICINE PERFUSION LUNG SCAN
TECHNIQUE: Perfusion images were obtained in multiple projections after
intravenous injection of radiopharmaceutical.
Ventilation scans intentionally deferred if perfusion scan and chest
x-ray adequate for interpretation during COVID 19 epidemic.
RADIOPHARMACEUTICALS:  4.1 mCi Zc-33m MAA IV

[Series 1: ant/post perf · 4.14mm/px · 1 of 1 slices shown (1 of 2)]
[im 1/1]
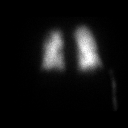

[Series 1: ant/post perf · 4.14mm/px · 1 of 1 slices shown (2 of 2)]
[im 1/1]
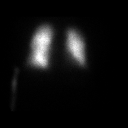

[Series 2: lao/rpo perf · 4.14mm/px · 1 of 1 slices shown (1 of 2)]
[im 1/1]
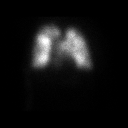

[Series 2: lao/rpo perf · 4.14mm/px · 1 of 1 slices shown (2 of 2)]
[im 1/1]
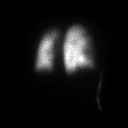

[Series 3: lpo/rao perf · 4.14mm/px · 1 of 1 slices shown (1 of 2)]
[im 1/1]
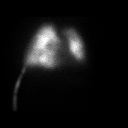

[Series 3: lpo/rao perf · 4.14mm/px · 1 of 1 slices shown (2 of 2)]
[im 1/1]
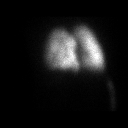

[Series 4: lt lat/rt lat perf · 4.14mm/px · 1 of 1 slices shown (1 of 2)]
[im 1/1]
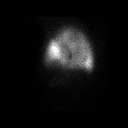

[Series 4: lt lat/rt lat perf · 4.14mm/px · 1 of 1 slices shown (2 of 2)]
[im 1/1]
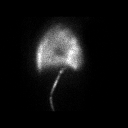

[8 of 8 positions shown; findings below may reference images not displayed]

FINDINGS: No segmental perfusion defects are present. There is some patchy
uptake which corresponds with the patient's multifocal airspace
disease.
IMPRESSION: 1. Very low probability for pulmonary embolus (0-9%) with small
perfusion defects smaller than radiographic lesions.
